# Patient Record
Sex: Female | Born: 1935 | Race: Black or African American | Hispanic: No | Marital: Married | Smoking: Current every day smoker
Health system: Southern US, Community
[De-identification: ages and names within clinical notes are randomized; demographics above are authoritative.]

## PROBLEM LIST (undated history)

## (undated) DIAGNOSIS — I2510 Atherosclerotic heart disease of native coronary artery without angina pectoris: Secondary | ICD-10-CM

## (undated) DIAGNOSIS — I5030 Unspecified diastolic (congestive) heart failure: Secondary | ICD-10-CM

## (undated) DIAGNOSIS — J449 Chronic obstructive pulmonary disease, unspecified: Secondary | ICD-10-CM

## (undated) DIAGNOSIS — K559 Vascular disorder of intestine, unspecified: Secondary | ICD-10-CM

## (undated) DIAGNOSIS — I739 Peripheral vascular disease, unspecified: Secondary | ICD-10-CM

## (undated) DIAGNOSIS — R0989 Other specified symptoms and signs involving the circulatory and respiratory systems: Secondary | ICD-10-CM

## (undated) DIAGNOSIS — D497 Neoplasm of unspecified behavior of endocrine glands and other parts of nervous system: Secondary | ICD-10-CM

## (undated) DIAGNOSIS — K219 Gastro-esophageal reflux disease without esophagitis: Secondary | ICD-10-CM

## (undated) DIAGNOSIS — K5730 Diverticulosis of large intestine without perforation or abscess without bleeding: Secondary | ICD-10-CM

## (undated) DIAGNOSIS — Z8679 Personal history of other diseases of the circulatory system: Secondary | ICD-10-CM

## (undated) DIAGNOSIS — I779 Disorder of arteries and arterioles, unspecified: Secondary | ICD-10-CM

## (undated) DIAGNOSIS — F419 Anxiety disorder, unspecified: Secondary | ICD-10-CM

## (undated) DIAGNOSIS — D353 Benign neoplasm of craniopharyngeal duct: Secondary | ICD-10-CM

## (undated) DIAGNOSIS — Z950 Presence of cardiac pacemaker: Principal | ICD-10-CM

## (undated) DIAGNOSIS — I495 Sick sinus syndrome: Secondary | ICD-10-CM

## (undated) DIAGNOSIS — E785 Hyperlipidemia, unspecified: Secondary | ICD-10-CM

## (undated) DIAGNOSIS — I480 Paroxysmal atrial fibrillation: Secondary | ICD-10-CM

## (undated) DIAGNOSIS — M1990 Unspecified osteoarthritis, unspecified site: Secondary | ICD-10-CM

## (undated) DIAGNOSIS — L89609 Pressure ulcer of unspecified heel, unspecified stage: Secondary | ICD-10-CM

## (undated) DIAGNOSIS — N189 Chronic kidney disease, unspecified: Secondary | ICD-10-CM

## (undated) DIAGNOSIS — I771 Stricture of artery: Secondary | ICD-10-CM

## (undated) DIAGNOSIS — J189 Pneumonia, unspecified organism: Secondary | ICD-10-CM

## (undated) DIAGNOSIS — D352 Benign neoplasm of pituitary gland: Secondary | ICD-10-CM

## (undated) HISTORY — DX: Peripheral vascular disease, unspecified: I73.9

## (undated) HISTORY — DX: Sick sinus syndrome: I49.5

## (undated) HISTORY — DX: Stricture of artery: I77.1

## (undated) HISTORY — DX: Diverticulosis of large intestine without perforation or abscess without bleeding: K57.30

## (undated) HISTORY — DX: Paroxysmal atrial fibrillation: I48.0

## (undated) HISTORY — PX: TOTAL KNEE ARTHROPLASTY: SHX125

## (undated) HISTORY — DX: Chronic kidney disease, unspecified: N18.9

## (undated) HISTORY — PX: CARDIAC CATHETERIZATION: SHX172

## (undated) HISTORY — DX: Benign neoplasm of pituitary gland: D35.2

## (undated) HISTORY — PX: BILATERAL HIP ARTHROSCOPY: SUR89

## (undated) HISTORY — DX: Benign neoplasm of craniopharyngeal duct: D35.3

## (undated) HISTORY — DX: Pressure ulcer of unspecified heel, unspecified stage: L89.609

## (undated) HISTORY — DX: Unspecified diastolic (congestive) heart failure: I50.30

## (undated) HISTORY — DX: Personal history of other diseases of the circulatory system: Z86.79

## (undated) HISTORY — DX: Hyperlipidemia, unspecified: E78.5

## (undated) HISTORY — PX: CHOLECYSTECTOMY: SHX55

## (undated) HISTORY — PX: OTHER SURGICAL HISTORY: SHX169

## (undated) HISTORY — DX: Chronic obstructive pulmonary disease, unspecified: J44.9

## (undated) HISTORY — DX: Unspecified osteoarthritis, unspecified site: M19.90

## (undated) HISTORY — DX: Atherosclerotic heart disease of native coronary artery without angina pectoris: I25.10

## (undated) HISTORY — DX: Gastro-esophageal reflux disease without esophagitis: K21.9

## (undated) HISTORY — DX: Presence of cardiac pacemaker: Z95.0

## (undated) HISTORY — DX: Other specified symptoms and signs involving the circulatory and respiratory systems: R09.89

---

## 1996-05-17 HISTORY — PX: OTHER SURGICAL HISTORY: SHX169

## 1996-05-18 ENCOUNTER — Encounter (INDEPENDENT_AMBULATORY_CARE_PROVIDER_SITE_OTHER): Payer: Self-pay | Admitting: Internal Medicine

## 1996-05-18 LAB — CONVERTED CEMR LAB

## 1997-06-17 HISTORY — PX: OTHER SURGICAL HISTORY: SHX169

## 1998-04-22 ENCOUNTER — Inpatient Hospital Stay (HOSPITAL_COMMUNITY): Admission: RE | Admit: 1998-04-22 | Discharge: 1998-04-25 | Payer: Self-pay | Admitting: Orthopaedic Surgery

## 1998-04-22 ENCOUNTER — Encounter: Payer: Self-pay | Admitting: Orthopaedic Surgery

## 1998-04-25 ENCOUNTER — Inpatient Hospital Stay
Admission: RE | Admit: 1998-04-25 | Discharge: 1998-05-02 | Payer: Self-pay | Admitting: Physical Medicine and Rehabilitation

## 1999-01-30 ENCOUNTER — Encounter: Payer: Self-pay | Admitting: Internal Medicine

## 1999-01-30 ENCOUNTER — Inpatient Hospital Stay (HOSPITAL_COMMUNITY): Admission: EM | Admit: 1999-01-30 | Discharge: 1999-02-02 | Payer: Self-pay | Admitting: Emergency Medicine

## 2000-02-25 ENCOUNTER — Encounter: Payer: Self-pay | Admitting: Orthopaedic Surgery

## 2000-03-01 ENCOUNTER — Inpatient Hospital Stay (HOSPITAL_COMMUNITY): Admission: RE | Admit: 2000-03-01 | Discharge: 2000-03-04 | Payer: Self-pay | Admitting: Orthopaedic Surgery

## 2000-03-04 ENCOUNTER — Inpatient Hospital Stay (HOSPITAL_COMMUNITY)
Admission: AD | Admit: 2000-03-04 | Discharge: 2000-03-11 | Payer: Self-pay | Admitting: Physical Medicine & Rehabilitation

## 2000-03-10 ENCOUNTER — Encounter: Payer: Self-pay | Admitting: Physical Medicine & Rehabilitation

## 2001-10-29 ENCOUNTER — Emergency Department (HOSPITAL_COMMUNITY): Admission: EM | Admit: 2001-10-29 | Discharge: 2001-10-29 | Payer: Self-pay | Admitting: Emergency Medicine

## 2002-02-26 ENCOUNTER — Encounter: Payer: Self-pay | Admitting: Cardiology

## 2002-02-26 ENCOUNTER — Inpatient Hospital Stay (HOSPITAL_COMMUNITY): Admission: EM | Admit: 2002-02-26 | Discharge: 2002-03-02 | Payer: Self-pay | Admitting: Emergency Medicine

## 2002-02-27 ENCOUNTER — Encounter: Payer: Self-pay | Admitting: Cardiology

## 2002-04-16 ENCOUNTER — Encounter (INDEPENDENT_AMBULATORY_CARE_PROVIDER_SITE_OTHER): Payer: Self-pay | Admitting: Internal Medicine

## 2002-07-16 HISTORY — PX: OTHER SURGICAL HISTORY: SHX169

## 2002-08-16 ENCOUNTER — Encounter (INDEPENDENT_AMBULATORY_CARE_PROVIDER_SITE_OTHER): Payer: Self-pay | Admitting: Internal Medicine

## 2002-08-24 ENCOUNTER — Encounter: Payer: Self-pay | Admitting: Emergency Medicine

## 2002-08-24 ENCOUNTER — Inpatient Hospital Stay (HOSPITAL_COMMUNITY): Admission: EM | Admit: 2002-08-24 | Discharge: 2002-08-26 | Payer: Self-pay | Admitting: Emergency Medicine

## 2002-08-26 ENCOUNTER — Encounter: Payer: Self-pay | Admitting: Family Medicine

## 2002-10-16 ENCOUNTER — Encounter (INDEPENDENT_AMBULATORY_CARE_PROVIDER_SITE_OTHER): Payer: Self-pay | Admitting: Internal Medicine

## 2002-12-16 ENCOUNTER — Encounter (INDEPENDENT_AMBULATORY_CARE_PROVIDER_SITE_OTHER): Payer: Self-pay | Admitting: Internal Medicine

## 2002-12-16 LAB — CONVERTED CEMR LAB: Hgb A1c MFr Bld: 6.6 %

## 2003-01-11 ENCOUNTER — Encounter (INDEPENDENT_AMBULATORY_CARE_PROVIDER_SITE_OTHER): Payer: Self-pay | Admitting: Internal Medicine

## 2003-01-11 LAB — CONVERTED CEMR LAB: Microalbumin U total vol: 403.5 mg/L

## 2003-04-17 ENCOUNTER — Encounter (INDEPENDENT_AMBULATORY_CARE_PROVIDER_SITE_OTHER): Payer: Self-pay | Admitting: Internal Medicine

## 2003-11-15 ENCOUNTER — Encounter (INDEPENDENT_AMBULATORY_CARE_PROVIDER_SITE_OTHER): Payer: Self-pay | Admitting: Internal Medicine

## 2003-11-15 LAB — CONVERTED CEMR LAB
Hgb A1c MFr Bld: 6.2 %
Microalbumin U total vol: 35.3 mg/L

## 2004-02-18 ENCOUNTER — Ambulatory Visit: Payer: Self-pay | Admitting: Orthopedic Surgery

## 2004-04-02 ENCOUNTER — Ambulatory Visit: Payer: Self-pay | Admitting: Family Medicine

## 2004-04-14 ENCOUNTER — Ambulatory Visit: Payer: Self-pay | Admitting: *Deleted

## 2004-04-20 ENCOUNTER — Ambulatory Visit: Payer: Self-pay | Admitting: *Deleted

## 2004-04-27 ENCOUNTER — Observation Stay: Payer: Self-pay | Admitting: *Deleted

## 2004-05-01 ENCOUNTER — Ambulatory Visit: Payer: Self-pay | Admitting: *Deleted

## 2004-05-17 ENCOUNTER — Encounter (INDEPENDENT_AMBULATORY_CARE_PROVIDER_SITE_OTHER): Payer: Self-pay | Admitting: Internal Medicine

## 2004-05-17 LAB — CONVERTED CEMR LAB: Microalbumin U total vol: 32.4 mg/L

## 2004-05-28 ENCOUNTER — Ambulatory Visit: Payer: Self-pay | Admitting: Family Medicine

## 2004-06-09 ENCOUNTER — Ambulatory Visit: Payer: Self-pay | Admitting: Family Medicine

## 2004-07-07 ENCOUNTER — Ambulatory Visit: Payer: Self-pay | Admitting: *Deleted

## 2004-09-04 ENCOUNTER — Ambulatory Visit: Payer: Self-pay | Admitting: Cardiology

## 2004-09-04 ENCOUNTER — Ambulatory Visit: Payer: Self-pay | Admitting: Family Medicine

## 2004-11-14 ENCOUNTER — Encounter (INDEPENDENT_AMBULATORY_CARE_PROVIDER_SITE_OTHER): Payer: Self-pay | Admitting: Internal Medicine

## 2004-11-14 LAB — CONVERTED CEMR LAB: Hgb A1c MFr Bld: 5.8 %

## 2004-11-25 ENCOUNTER — Ambulatory Visit: Payer: Self-pay | Admitting: Family Medicine

## 2004-12-04 ENCOUNTER — Ambulatory Visit: Payer: Self-pay

## 2004-12-08 ENCOUNTER — Ambulatory Visit: Payer: Self-pay | Admitting: Cardiology

## 2005-02-20 ENCOUNTER — Emergency Department (HOSPITAL_COMMUNITY): Admission: EM | Admit: 2005-02-20 | Discharge: 2005-02-21 | Payer: Self-pay | Admitting: Emergency Medicine

## 2005-02-25 ENCOUNTER — Ambulatory Visit: Payer: Self-pay | Admitting: Family Medicine

## 2005-05-17 ENCOUNTER — Encounter (INDEPENDENT_AMBULATORY_CARE_PROVIDER_SITE_OTHER): Payer: Self-pay | Admitting: Internal Medicine

## 2005-05-17 LAB — CONVERTED CEMR LAB: Hgb A1c MFr Bld: 6 %

## 2005-06-01 ENCOUNTER — Ambulatory Visit: Payer: Self-pay | Admitting: Family Medicine

## 2005-08-12 ENCOUNTER — Ambulatory Visit: Payer: Self-pay

## 2005-08-12 ENCOUNTER — Ambulatory Visit: Payer: Self-pay | Admitting: Cardiology

## 2005-10-09 ENCOUNTER — Emergency Department (HOSPITAL_COMMUNITY): Admission: EM | Admit: 2005-10-09 | Discharge: 2005-10-09 | Payer: Self-pay | Admitting: Emergency Medicine

## 2005-10-12 ENCOUNTER — Ambulatory Visit: Payer: Self-pay | Admitting: Family Medicine

## 2006-02-16 ENCOUNTER — Encounter (INDEPENDENT_AMBULATORY_CARE_PROVIDER_SITE_OTHER): Payer: Self-pay | Admitting: Internal Medicine

## 2006-02-16 ENCOUNTER — Ambulatory Visit: Payer: Self-pay | Admitting: Family Medicine

## 2006-02-24 ENCOUNTER — Ambulatory Visit: Payer: Self-pay | Admitting: Family Medicine

## 2006-05-22 ENCOUNTER — Inpatient Hospital Stay (HOSPITAL_COMMUNITY): Admission: EM | Admit: 2006-05-22 | Discharge: 2006-05-30 | Payer: Self-pay | Admitting: Cardiology

## 2006-05-22 ENCOUNTER — Encounter: Payer: Self-pay | Admitting: Emergency Medicine

## 2006-05-22 ENCOUNTER — Ambulatory Visit: Payer: Self-pay | Admitting: *Deleted

## 2006-05-23 ENCOUNTER — Encounter (INDEPENDENT_AMBULATORY_CARE_PROVIDER_SITE_OTHER): Payer: Self-pay | Admitting: Cardiology

## 2006-06-14 ENCOUNTER — Ambulatory Visit: Payer: Self-pay | Admitting: Cardiology

## 2006-08-25 HISTORY — PX: OTHER SURGICAL HISTORY: SHX169

## 2006-10-16 ENCOUNTER — Encounter (INDEPENDENT_AMBULATORY_CARE_PROVIDER_SITE_OTHER): Payer: Self-pay | Admitting: Internal Medicine

## 2006-10-27 ENCOUNTER — Ambulatory Visit: Payer: Self-pay | Admitting: Internal Medicine

## 2006-10-27 DIAGNOSIS — D352 Benign neoplasm of pituitary gland: Secondary | ICD-10-CM

## 2006-10-27 DIAGNOSIS — M25569 Pain in unspecified knee: Secondary | ICD-10-CM

## 2006-10-27 DIAGNOSIS — D353 Benign neoplasm of craniopharyngeal duct: Secondary | ICD-10-CM

## 2006-10-27 DIAGNOSIS — I10 Essential (primary) hypertension: Secondary | ICD-10-CM

## 2006-10-27 HISTORY — DX: Benign neoplasm of pituitary gland: D35.2

## 2006-10-28 LAB — CONVERTED CEMR LAB
Creatinine, Ser: 0.9 mg/dL (ref 0.4–1.2)
GFR calc Af Amer: 77 mL/min
Hgb A1c MFr Bld: 6.4 % — ABNORMAL HIGH (ref 4.6–6.0)
Potassium: 4.6 meq/L (ref 3.5–5.1)
Sodium: 141 meq/L (ref 135–145)

## 2006-12-14 ENCOUNTER — Ambulatory Visit: Payer: Self-pay | Admitting: Cardiology

## 2006-12-27 ENCOUNTER — Ambulatory Visit: Payer: Self-pay

## 2007-01-13 ENCOUNTER — Ambulatory Visit: Payer: Self-pay | Admitting: Cardiology

## 2007-01-24 ENCOUNTER — Ambulatory Visit: Payer: Self-pay | Admitting: Cardiovascular Disease

## 2007-01-24 LAB — CONVERTED CEMR LAB
Creatinine, Ser: 1.1 mg/dL (ref 0.4–1.2)
Potassium: 4.9 meq/L (ref 3.5–5.1)
Sodium: 142 meq/L (ref 135–145)

## 2007-01-30 ENCOUNTER — Ambulatory Visit: Payer: Self-pay | Admitting: Internal Medicine

## 2007-02-13 ENCOUNTER — Ambulatory Visit: Payer: Self-pay | Admitting: Cardiovascular Disease

## 2007-03-16 ENCOUNTER — Ambulatory Visit: Payer: Self-pay | Admitting: Family Medicine

## 2007-04-26 ENCOUNTER — Encounter (INDEPENDENT_AMBULATORY_CARE_PROVIDER_SITE_OTHER): Payer: Self-pay | Admitting: Internal Medicine

## 2007-04-26 DIAGNOSIS — M129 Arthropathy, unspecified: Secondary | ICD-10-CM | POA: Insufficient documentation

## 2007-04-26 DIAGNOSIS — M1610 Unilateral primary osteoarthritis, unspecified hip: Secondary | ICD-10-CM | POA: Insufficient documentation

## 2007-04-26 DIAGNOSIS — M169 Osteoarthritis of hip, unspecified: Secondary | ICD-10-CM

## 2007-04-26 DIAGNOSIS — R0989 Other specified symptoms and signs involving the circulatory and respiratory systems: Secondary | ICD-10-CM

## 2007-04-26 DIAGNOSIS — F17200 Nicotine dependence, unspecified, uncomplicated: Secondary | ICD-10-CM

## 2007-04-26 DIAGNOSIS — R809 Proteinuria, unspecified: Secondary | ICD-10-CM | POA: Insufficient documentation

## 2007-04-26 HISTORY — DX: Other specified symptoms and signs involving the circulatory and respiratory systems: R09.89

## 2007-05-03 ENCOUNTER — Ambulatory Visit: Payer: Self-pay | Admitting: Family Medicine

## 2007-05-03 DIAGNOSIS — R42 Dizziness and giddiness: Secondary | ICD-10-CM

## 2007-05-05 LAB — CONVERTED CEMR LAB
Basophils Relative: 0.2 % (ref 0.0–1.0)
CO2: 34 meq/L — ABNORMAL HIGH (ref 19–32)
Eosinophils Relative: 2.3 % (ref 0.0–5.0)
GFR calc Af Amer: 61 mL/min
Glucose, Bld: 78 mg/dL (ref 70–99)
Hemoglobin: 13.6 g/dL (ref 12.0–15.0)
Lymphocytes Relative: 21.1 % (ref 12.0–46.0)
Microalb Creat Ratio: 505.6 mg/g — ABNORMAL HIGH (ref 0.0–30.0)
Monocytes Absolute: 0.6 10*3/uL (ref 0.2–0.7)
Neutro Abs: 6.3 10*3/uL (ref 1.4–7.7)
Potassium: 4.7 meq/L (ref 3.5–5.1)
WBC: 9 10*3/uL (ref 4.5–10.5)

## 2007-05-18 DIAGNOSIS — Z8679 Personal history of other diseases of the circulatory system: Secondary | ICD-10-CM

## 2007-05-18 HISTORY — DX: Personal history of other diseases of the circulatory system: Z86.79

## 2007-05-18 HISTORY — PX: INSERT / REPLACE / REMOVE PACEMAKER: SUR710

## 2007-05-19 ENCOUNTER — Ambulatory Visit: Payer: Self-pay | Admitting: Cardiology

## 2007-07-04 ENCOUNTER — Ambulatory Visit: Payer: Self-pay

## 2007-07-14 ENCOUNTER — Ambulatory Visit: Payer: Self-pay | Admitting: Cardiology

## 2007-09-07 ENCOUNTER — Encounter: Payer: Self-pay | Admitting: Family Medicine

## 2007-10-07 ENCOUNTER — Ambulatory Visit: Payer: Self-pay | Admitting: *Deleted

## 2007-10-07 ENCOUNTER — Inpatient Hospital Stay (HOSPITAL_COMMUNITY): Admission: EM | Admit: 2007-10-07 | Discharge: 2007-10-10 | Payer: Self-pay | Admitting: Emergency Medicine

## 2007-10-12 ENCOUNTER — Ambulatory Visit: Payer: Self-pay | Admitting: Internal Medicine

## 2007-10-12 ENCOUNTER — Inpatient Hospital Stay (HOSPITAL_COMMUNITY): Admission: AD | Admit: 2007-10-12 | Discharge: 2007-10-13 | Payer: Self-pay | Admitting: Internal Medicine

## 2007-10-17 ENCOUNTER — Ambulatory Visit: Payer: Self-pay | Admitting: Cardiology

## 2007-10-20 ENCOUNTER — Ambulatory Visit: Payer: Self-pay | Admitting: Cardiovascular Disease

## 2007-10-27 ENCOUNTER — Ambulatory Visit: Payer: Self-pay

## 2007-10-27 ENCOUNTER — Ambulatory Visit: Payer: Self-pay | Admitting: Cardiology

## 2007-11-03 ENCOUNTER — Ambulatory Visit: Payer: Self-pay | Admitting: Cardiology

## 2007-11-08 ENCOUNTER — Ambulatory Visit: Payer: Self-pay | Admitting: Internal Medicine

## 2007-11-16 ENCOUNTER — Ambulatory Visit: Payer: Self-pay | Admitting: Cardiology

## 2007-12-01 ENCOUNTER — Ambulatory Visit: Payer: Self-pay | Admitting: Cardiology

## 2007-12-13 ENCOUNTER — Ambulatory Visit: Payer: Self-pay | Admitting: Cardiology

## 2007-12-18 ENCOUNTER — Ambulatory Visit: Payer: Self-pay | Admitting: Internal Medicine

## 2007-12-18 ENCOUNTER — Ambulatory Visit: Payer: Self-pay | Admitting: Cardiology

## 2007-12-18 ENCOUNTER — Inpatient Hospital Stay (HOSPITAL_COMMUNITY): Admission: EM | Admit: 2007-12-18 | Discharge: 2007-12-25 | Payer: Self-pay | Admitting: Emergency Medicine

## 2007-12-18 ENCOUNTER — Encounter (INDEPENDENT_AMBULATORY_CARE_PROVIDER_SITE_OTHER): Payer: Self-pay | Admitting: Internal Medicine

## 2007-12-18 DIAGNOSIS — R599 Enlarged lymph nodes, unspecified: Secondary | ICD-10-CM | POA: Insufficient documentation

## 2007-12-18 DIAGNOSIS — J9600 Acute respiratory failure, unspecified whether with hypoxia or hypercapnia: Secondary | ICD-10-CM

## 2007-12-18 DIAGNOSIS — E278 Other specified disorders of adrenal gland: Secondary | ICD-10-CM | POA: Insufficient documentation

## 2007-12-18 DIAGNOSIS — J189 Pneumonia, unspecified organism: Secondary | ICD-10-CM

## 2007-12-19 ENCOUNTER — Encounter (INDEPENDENT_AMBULATORY_CARE_PROVIDER_SITE_OTHER): Payer: Self-pay | Admitting: Internal Medicine

## 2007-12-21 ENCOUNTER — Encounter (INDEPENDENT_AMBULATORY_CARE_PROVIDER_SITE_OTHER): Payer: Self-pay | Admitting: Internal Medicine

## 2007-12-25 ENCOUNTER — Encounter (INDEPENDENT_AMBULATORY_CARE_PROVIDER_SITE_OTHER): Payer: Self-pay | Admitting: Internal Medicine

## 2008-01-02 ENCOUNTER — Ambulatory Visit: Payer: Self-pay | Admitting: Cardiology

## 2008-01-04 ENCOUNTER — Ambulatory Visit: Payer: Self-pay | Admitting: Family Medicine

## 2008-01-04 DIAGNOSIS — I5032 Chronic diastolic (congestive) heart failure: Secondary | ICD-10-CM

## 2008-01-18 ENCOUNTER — Encounter (INDEPENDENT_AMBULATORY_CARE_PROVIDER_SITE_OTHER): Payer: Self-pay | Admitting: Internal Medicine

## 2008-01-19 ENCOUNTER — Inpatient Hospital Stay (HOSPITAL_COMMUNITY): Admission: EM | Admit: 2008-01-19 | Discharge: 2008-01-31 | Payer: Self-pay | Admitting: Emergency Medicine

## 2008-01-19 ENCOUNTER — Ambulatory Visit: Payer: Self-pay | Admitting: Cardiology

## 2008-01-19 ENCOUNTER — Ambulatory Visit: Payer: Self-pay | Admitting: Internal Medicine

## 2008-01-19 DIAGNOSIS — M25519 Pain in unspecified shoulder: Secondary | ICD-10-CM

## 2008-01-19 LAB — CONVERTED CEMR LAB
Basophils Absolute: 0.1 10*3/uL (ref 0.0–0.1)
Basophils Relative: 0.7 % (ref 0.0–3.0)
Hemoglobin: 11.3 g/dL — ABNORMAL LOW (ref 12.0–15.0)
Lymphocytes Relative: 7.6 % — ABNORMAL LOW (ref 12.0–46.0)
MCHC: 34.3 g/dL (ref 30.0–36.0)
MCV: 96.5 fL (ref 78.0–100.0)
Neutro Abs: 12.6 10*3/uL — ABNORMAL HIGH (ref 1.4–7.7)
RBC: 3.41 M/uL — ABNORMAL LOW (ref 3.87–5.11)

## 2008-01-23 ENCOUNTER — Encounter (INDEPENDENT_AMBULATORY_CARE_PROVIDER_SITE_OTHER): Payer: Self-pay | Admitting: Internal Medicine

## 2008-01-23 ENCOUNTER — Telehealth (INDEPENDENT_AMBULATORY_CARE_PROVIDER_SITE_OTHER): Payer: Self-pay | Admitting: Internal Medicine

## 2008-01-24 ENCOUNTER — Encounter (INDEPENDENT_AMBULATORY_CARE_PROVIDER_SITE_OTHER): Payer: Self-pay | Admitting: Internal Medicine

## 2008-01-30 ENCOUNTER — Encounter (INDEPENDENT_AMBULATORY_CARE_PROVIDER_SITE_OTHER): Payer: Self-pay | Admitting: Internal Medicine

## 2008-02-02 ENCOUNTER — Telehealth (INDEPENDENT_AMBULATORY_CARE_PROVIDER_SITE_OTHER): Payer: Self-pay | Admitting: Internal Medicine

## 2008-02-16 ENCOUNTER — Encounter: Payer: Self-pay | Admitting: Family Medicine

## 2008-02-20 ENCOUNTER — Ambulatory Visit: Payer: Self-pay | Admitting: Family Medicine

## 2008-02-22 ENCOUNTER — Telehealth (INDEPENDENT_AMBULATORY_CARE_PROVIDER_SITE_OTHER): Payer: Self-pay | Admitting: Internal Medicine

## 2008-03-04 ENCOUNTER — Ambulatory Visit: Payer: Self-pay | Admitting: Internal Medicine

## 2008-03-12 ENCOUNTER — Ambulatory Visit: Payer: Self-pay | Admitting: Cardiology

## 2008-03-12 LAB — CONVERTED CEMR LAB
ALT: 10 units/L (ref 0–35)
Albumin: 3.7 g/dL (ref 3.5–5.2)
Alkaline Phosphatase: 72 units/L (ref 39–117)
Total Protein: 6.2 g/dL (ref 6.0–8.3)

## 2008-05-22 ENCOUNTER — Ambulatory Visit: Payer: Self-pay | Admitting: Family Medicine

## 2008-05-24 LAB — CONVERTED CEMR LAB
BUN: 15 mg/dL (ref 6–23)
Calcium: 9.3 mg/dL (ref 8.4–10.5)
Creatinine, Ser: 1.1 mg/dL (ref 0.4–1.2)
GFR calc Af Amer: 61 mL/min
Glucose, Bld: 76 mg/dL (ref 70–99)
Microalb, Ur: 42.8 mg/dL — ABNORMAL HIGH (ref 0.0–1.9)
Sodium: 141 meq/L (ref 135–145)

## 2008-06-10 ENCOUNTER — Encounter (INDEPENDENT_AMBULATORY_CARE_PROVIDER_SITE_OTHER): Payer: Self-pay | Admitting: Internal Medicine

## 2008-06-11 ENCOUNTER — Encounter (INDEPENDENT_AMBULATORY_CARE_PROVIDER_SITE_OTHER): Payer: Self-pay | Admitting: Internal Medicine

## 2008-06-11 ENCOUNTER — Ambulatory Visit: Payer: Self-pay | Admitting: Cardiology

## 2008-06-11 LAB — CONVERTED CEMR LAB
ALT: 9 units/L (ref 0–35)
AST: 18 units/L (ref 0–37)
Alkaline Phosphatase: 72 units/L (ref 39–117)
Bilirubin, Direct: 0.1 mg/dL (ref 0.0–0.3)
Indirect Bilirubin: 0.1 mg/dL (ref 0.0–0.9)

## 2008-06-12 ENCOUNTER — Encounter (INDEPENDENT_AMBULATORY_CARE_PROVIDER_SITE_OTHER): Payer: Self-pay | Admitting: Internal Medicine

## 2008-06-20 ENCOUNTER — Encounter (INDEPENDENT_AMBULATORY_CARE_PROVIDER_SITE_OTHER): Payer: Self-pay | Admitting: Internal Medicine

## 2008-07-18 ENCOUNTER — Telehealth (INDEPENDENT_AMBULATORY_CARE_PROVIDER_SITE_OTHER): Payer: Self-pay | Admitting: Internal Medicine

## 2008-08-23 ENCOUNTER — Encounter (INDEPENDENT_AMBULATORY_CARE_PROVIDER_SITE_OTHER): Payer: Self-pay | Admitting: Radiology

## 2008-09-30 ENCOUNTER — Ambulatory Visit: Payer: Self-pay | Admitting: Internal Medicine

## 2008-11-27 ENCOUNTER — Encounter (INDEPENDENT_AMBULATORY_CARE_PROVIDER_SITE_OTHER): Payer: Self-pay | Admitting: Internal Medicine

## 2008-11-29 ENCOUNTER — Encounter: Payer: Self-pay | Admitting: Cardiology

## 2008-11-29 ENCOUNTER — Ambulatory Visit: Payer: Self-pay | Admitting: Cardiology

## 2008-11-29 DIAGNOSIS — I70219 Atherosclerosis of native arteries of extremities with intermittent claudication, unspecified extremity: Secondary | ICD-10-CM | POA: Insufficient documentation

## 2008-12-17 ENCOUNTER — Ambulatory Visit: Payer: Self-pay | Admitting: Family Medicine

## 2008-12-17 DIAGNOSIS — R5381 Other malaise: Secondary | ICD-10-CM | POA: Insufficient documentation

## 2008-12-17 DIAGNOSIS — R5383 Other fatigue: Secondary | ICD-10-CM

## 2008-12-17 LAB — CONVERTED CEMR LAB
Bacteria, UA: 0
Bilirubin Urine: NEGATIVE
Blood in Urine, dipstick: NEGATIVE
Casts: 0 /lpf
Glucose, Urine, Semiquant: NEGATIVE
Ketones, urine, test strip: NEGATIVE
Protein, U semiquant: NEGATIVE
RBC / HPF: 0
Urobilinogen, UA: 0.2
WBC, UA: 0 cells/hpf
pH: 6

## 2008-12-19 LAB — CONVERTED CEMR LAB
BUN: 16 mg/dL (ref 6–23)
Basophils Relative: 0.1 % (ref 0.0–3.0)
CO2: 32 meq/L (ref 19–32)
Chloride: 104 meq/L (ref 96–112)
Creatinine, Ser: 1.1 mg/dL (ref 0.4–1.2)
Eosinophils Absolute: 0.3 10*3/uL (ref 0.0–0.7)
Eosinophils Relative: 3.2 % (ref 0.0–5.0)
HCT: 38.9 % (ref 36.0–46.0)
Lymphs Abs: 1.6 10*3/uL (ref 0.7–4.0)
MCHC: 33.6 g/dL (ref 30.0–36.0)
MCV: 95.4 fL (ref 78.0–100.0)
Microalb Creat Ratio: 1.9 mg/g (ref 0.0–30.0)
Monocytes Absolute: 0.7 10*3/uL (ref 0.1–1.0)
Neutrophils Relative %: 70.3 % (ref 43.0–77.0)
Potassium: 4.6 meq/L (ref 3.5–5.1)
RBC: 4.08 M/uL (ref 3.87–5.11)
WBC: 8.6 10*3/uL (ref 4.5–10.5)

## 2008-12-24 ENCOUNTER — Ambulatory Visit: Payer: Self-pay | Admitting: Family Medicine

## 2008-12-24 DIAGNOSIS — E7800 Pure hypercholesterolemia, unspecified: Secondary | ICD-10-CM

## 2008-12-25 LAB — CONVERTED CEMR LAB
ALT: 15 units/L (ref 0–35)
AST: 28 units/L (ref 0–37)
Cholesterol: 131 mg/dL (ref 0–200)
LDL Cholesterol: 52 mg/dL (ref 0–99)
Total CHOL/HDL Ratio: 2
VLDL: 14.2 mg/dL (ref 0.0–40.0)

## 2009-03-03 ENCOUNTER — Ambulatory Visit: Payer: Self-pay | Admitting: Family Medicine

## 2009-03-31 ENCOUNTER — Encounter: Payer: Self-pay | Admitting: Internal Medicine

## 2009-03-31 ENCOUNTER — Ambulatory Visit: Payer: Self-pay | Admitting: Internal Medicine

## 2009-06-19 ENCOUNTER — Ambulatory Visit: Payer: Self-pay | Admitting: Cardiovascular Disease

## 2009-06-19 DIAGNOSIS — I2510 Atherosclerotic heart disease of native coronary artery without angina pectoris: Secondary | ICD-10-CM | POA: Insufficient documentation

## 2009-06-19 DIAGNOSIS — I739 Peripheral vascular disease, unspecified: Secondary | ICD-10-CM | POA: Insufficient documentation

## 2009-06-19 DIAGNOSIS — E785 Hyperlipidemia, unspecified: Secondary | ICD-10-CM | POA: Insufficient documentation

## 2009-06-20 ENCOUNTER — Encounter: Payer: Self-pay | Admitting: Cardiovascular Disease

## 2009-07-06 ENCOUNTER — Emergency Department (HOSPITAL_COMMUNITY): Admission: EM | Admit: 2009-07-06 | Discharge: 2009-07-06 | Payer: Self-pay | Admitting: Emergency Medicine

## 2009-07-09 ENCOUNTER — Ambulatory Visit: Payer: Self-pay | Admitting: Family Medicine

## 2009-07-09 DIAGNOSIS — J449 Chronic obstructive pulmonary disease, unspecified: Secondary | ICD-10-CM

## 2009-07-09 DIAGNOSIS — R0789 Other chest pain: Secondary | ICD-10-CM | POA: Insufficient documentation

## 2009-07-09 DIAGNOSIS — J4489 Other specified chronic obstructive pulmonary disease: Secondary | ICD-10-CM | POA: Insufficient documentation

## 2009-07-09 DIAGNOSIS — F4322 Adjustment disorder with anxiety: Secondary | ICD-10-CM

## 2009-08-05 ENCOUNTER — Telehealth: Payer: Self-pay | Admitting: Family Medicine

## 2009-08-06 ENCOUNTER — Telehealth (INDEPENDENT_AMBULATORY_CARE_PROVIDER_SITE_OTHER): Payer: Self-pay | Admitting: *Deleted

## 2009-08-13 ENCOUNTER — Ambulatory Visit: Payer: Self-pay | Admitting: Family Medicine

## 2009-09-15 ENCOUNTER — Ambulatory Visit: Payer: Self-pay | Admitting: Internal Medicine

## 2009-09-15 DIAGNOSIS — I4891 Unspecified atrial fibrillation: Secondary | ICD-10-CM | POA: Insufficient documentation

## 2009-09-15 DIAGNOSIS — Z950 Presence of cardiac pacemaker: Secondary | ICD-10-CM

## 2009-09-15 HISTORY — DX: Presence of cardiac pacemaker: Z95.0

## 2009-09-19 ENCOUNTER — Telehealth: Payer: Self-pay | Admitting: Family Medicine

## 2009-10-23 ENCOUNTER — Ambulatory Visit: Payer: Self-pay | Admitting: Family Medicine

## 2009-10-30 DIAGNOSIS — M1990 Unspecified osteoarthritis, unspecified site: Secondary | ICD-10-CM | POA: Insufficient documentation

## 2009-12-03 ENCOUNTER — Ambulatory Visit: Payer: Self-pay | Admitting: Family Medicine

## 2009-12-03 DIAGNOSIS — R1011 Right upper quadrant pain: Secondary | ICD-10-CM

## 2009-12-04 ENCOUNTER — Emergency Department (HOSPITAL_COMMUNITY): Admission: EM | Admit: 2009-12-04 | Discharge: 2009-12-05 | Payer: Self-pay | Admitting: Emergency Medicine

## 2009-12-04 LAB — CONVERTED CEMR LAB
Albumin: 3.9 g/dL (ref 3.5–5.2)
Basophils Relative: 0.1 % (ref 0.0–3.0)
Eosinophils Relative: 0.3 % (ref 0.0–5.0)
HCT: 43.4 % (ref 36.0–46.0)
Hemoglobin: 14.6 g/dL (ref 12.0–15.0)
Lipase: 12 units/L (ref 11.0–59.0)
Lymphs Abs: 1.8 10*3/uL (ref 0.7–4.0)
MCV: 95.4 fL (ref 78.0–100.0)
Monocytes Absolute: 0.8 10*3/uL (ref 0.1–1.0)
Monocytes Relative: 7.7 % (ref 3.0–12.0)
Neutro Abs: 8.2 10*3/uL — ABNORMAL HIGH (ref 1.4–7.7)
Platelets: 402 10*3/uL — ABNORMAL HIGH (ref 150.0–400.0)
RBC: 4.55 M/uL (ref 3.87–5.11)
Total Bilirubin: 0.5 mg/dL (ref 0.3–1.2)
WBC: 11 10*3/uL — ABNORMAL HIGH (ref 4.5–10.5)

## 2009-12-05 ENCOUNTER — Encounter: Payer: Self-pay | Admitting: Family Medicine

## 2010-01-07 ENCOUNTER — Ambulatory Visit: Payer: Self-pay | Admitting: Family Medicine

## 2010-01-09 ENCOUNTER — Encounter: Payer: Self-pay | Admitting: Family Medicine

## 2010-02-05 ENCOUNTER — Telehealth: Payer: Self-pay | Admitting: Family Medicine

## 2010-02-13 ENCOUNTER — Telehealth: Payer: Self-pay | Admitting: Family Medicine

## 2010-02-27 ENCOUNTER — Inpatient Hospital Stay (HOSPITAL_COMMUNITY): Admission: EM | Admit: 2010-02-27 | Discharge: 2010-03-02 | Payer: Self-pay | Admitting: Emergency Medicine

## 2010-03-03 ENCOUNTER — Telehealth: Payer: Self-pay | Admitting: Family Medicine

## 2010-03-09 ENCOUNTER — Ambulatory Visit: Payer: Self-pay | Admitting: Family Medicine

## 2010-03-09 DIAGNOSIS — K529 Noninfective gastroenteritis and colitis, unspecified: Secondary | ICD-10-CM

## 2010-04-02 ENCOUNTER — Ambulatory Visit: Payer: Self-pay | Admitting: Internal Medicine

## 2010-05-21 ENCOUNTER — Ambulatory Visit
Admission: RE | Admit: 2010-05-21 | Discharge: 2010-05-21 | Payer: Self-pay | Source: Home / Self Care | Attending: Family Medicine | Admitting: Family Medicine

## 2010-06-08 ENCOUNTER — Encounter: Payer: Self-pay | Admitting: Family Medicine

## 2010-06-16 NOTE — Assessment & Plan Note (Signed)
Summary: Medicare AWV   Vital Signs:  Patient profile:   77 year old female Height:      63 inches Weight:      170.13 pounds BMI:     30.25 Temp:     98.4 degrees F oral Pulse rate:   64 / minute Pulse rhythm:   regular BP sitting:   120 / 80  (left arm) Cuff size:   regular  Vitals Entered By: Linde Gillis CMA Duncan Dull) (January 07, 2010 9:12 AM) CC: Medicare AWV   History of Present Illness: Here for Medicare AWV:  1.   Risk factors based on Past M, S, F history: CAD- followed by cardiology.  Arthritis- s/p bilateral knee replacement, uses walker, is a fall risk.  Increased knee pain.  2.   Physical Activities: gets around with her walker. 3.   Depression/mood: Denies signs and symptoms of depression. 4.   Hearing: see nursing assessment. 5.   ADL's: see geriatrics assessment. 6.   Fall Risk: Is at a fall risk but home is safe and has a lot of family support 7.   Home Safety:  safe 8.   Height, weight, &visual acuity: see nursing assessment. 9.   Counseling: counselled on need for Zostavax and discussed other screening.  We agreed she does not need a pap smear, mammogram or colonscopy. 10.   Labs ordered based on risk factors: UTD 11.           Referral Coordination- none necesssary 12.           Care Plan- Tramadol for increased knee pain. 13.            Cognitive Assessment- see geriatrics assessment.   Current Medications (verified): 1)  Zocor 40 Mg  Tabs (Simvastatin) .... Take 1 Tablet By Mouth Once A Day 2)  Benazepril Hcl 20 Mg Tabs (Benazepril Hcl) .... Take 2 Tablets By Mouth Daily 3)  Aleve 220 Mg Tabs (Naproxen Sodium) .... As Needed 4)  Aspirin 81 Mg  Tabs (Aspirin) .... Take 1 Tablet By Mouth Once A Day 5)  Mirtazapine 7.5 Mg Tabs (Mirtazapine) .[name].. 1 Tab By Mouth Qhs 6)  Metformin Hcl 500 Mg Tabs (Metformin Hcl) .... Take 1 Tab Twice Daily 7)  Carvedilol 6.25 Mg Tabs (Carvedilol) .... Take One Tablet By Mouth Twice A Day 8)  Tramadol Hcl 50 Mg  Tabs  (Tramadol Hcl) .[name].. 1-2 Tab By Mouth Two Times A Day As Needed Pain  Allergies: 1)  ! Naprosyn  Past History:  Past Medical History: Last updated: 09/30/2008 Hyperlipidemia Myocardial infarction, hx of Diabetes mellitus, type II CAD PVD Symptomatic bradycardia Paroxysmal atrial fibrillation Hx of A flutter Hx of chronic diastolic heart failure Hx of nonobstructive carotid disease Arthritis  Past Surgical History: Last updated: 12/03/2009  Right knee replacement 1998 Left knee replacement 12/99 Right hip replacement 1997 Left hip replacement 11/01 Pituitary tumor removed- [facility] - benign 12/02 Recurrent pituitary tumor - [facility] 01/08 Myocardial perfusion study, EF 59% 04/04 Adenosine Myoview study - abn EF 53% 12/04/04 Carotid doppler 02/99 ECHO with [facility] ( old records) 01/98 Stress cardiolite - EF 53% 03/04 CATH some obstructive dz 05/22/06 CATH - MI, stent 3/08 GAMMA knife radiosurg  ([facility])--08/25/06 PVD with stent in the right iliac  Family History: Last updated: 2007/05/05 Father: Died of unknown causes Mother: Deceased, HTN Siblings: 1 brother, HTN, CAD,                2 sisters alive  with HTN    CV + brother HBP + DM + Arthritis + patient No cancer Depression ?  Social History: Last updated: 05/22/2008 Marital Status: Widow, husband died at age 58, DM, HTN Children: 3 Occupation: Agricultural engineer in the past, active in church --son lives with her and does most ot the housework and cooking--1/10  Risk Factors: Smoking Status: current (05/22/2008) Packs/Day: 5-8cigs qd (05/22/2008)  Review of Systems      See HPI General:  Denies malaise. Eyes:  Denies blurring. CV:  Denies chest pain or discomfort. Resp:  Denies shortness of breath. GI:  Denies abdominal pain, bloody stools, and change in bowel habits. MS:  Complains of joint pain and joint swelling; denies joint redness and loss of strength. Derm:  Denies rash. Neuro:  Denies falling  down and headaches. Psych:  Denies anxiety, depression, sense of great danger, suicidal thoughts/plans, thoughts of violence, unusual visions or sounds, and thoughts /plans of harming others. Heme:  Denies abnormal bruising and bleeding.  Physical Exam  General:  well-appearing African American woman, obviously uncomfortable.  Lungs:  normal respiratory effort, no intercostal retractions, no accessory muscle use, and normal breath sounds.   Heart:  normal rate and regular rhythm.  very soft systolic  murmur Msk:  bilateral old scars from knee replacements bilaterally, some pain with motion, no redness or warmth Extremities:  trace pitting edema at ankles bilat   Neurologic:  alert & oriented X3 and cranial nerves II-XII intact.   Walks with walker, shuffles Psych:  normally interactive, flat affect, and subdued.     Impression & Recommendations:  Problem # 1:  Preventive Health Care (ICD-V70.0)  Reviewed preventive care protocols, scheduled due services, and updated immunizations Discussed nutrition, exercise, diet, and healthy lifestyle.  Zostavax today.  Orders: Medicare -1st Annual Wellness Visit [phone])  Problem # 2:  DEGENERATIVE JOINT DISEASE, ADVANCED (ICD-715.90) Assessment: Deteriorated Will try Tramadol for pain.  Pt to call in one month with update on how she is feeling. Her updated medication list for this problem includes:    Aleve 220 Mg Tabs (Naproxen sodium) .[name]... As needed    Aspirin 81 Mg Tabs (Aspirin) .[name]... Take 1 tablet by mouth once a day    Tramadol Hcl 50 Mg Tabs (Tramadol hcl) .[name]... 1-2 tab by mouth two times a day as needed pain  Complete Medication List: 1)  Zocor 40 Mg Tabs (Simvastatin) .... Take 1 tablet by mouth once a day 2)  Benazepril Hcl 20 Mg Tabs (Benazepril hcl) .... Take 2 tablets by mouth daily 3)  Aleve 220 Mg Tabs (Naproxen sodium) .... As needed 4)  Aspirin 81 Mg Tabs (Aspirin) .... Take 1 tablet by mouth once a day 5)   Mirtazapine 7.5 Mg Tabs (Mirtazapine) .[name].. 1 tab by mouth qhs 6)  Metformin Hcl 500 Mg Tabs (Metformin hcl) .... Take 1 tab twice daily 7)  Carvedilol 6.25 Mg Tabs (Carvedilol) .... Take one tablet by mouth twice a day 8)  Tramadol Hcl 50 Mg Tabs (Tramadol hcl) .[name].. 1-2 tab by mouth two times a day as needed pain Prescriptions: TRAMADOL HCL 50 MG  TABS (TRAMADOL HCL) 1-2 tab by mouth two times a day as needed pain  #60 x 0   Entered and Authorized by:   Ruthe Mannan MD   Signed by:   Ruthe Mannan MD on 01/07/2010   Method used:   Electronically to        [facility]* (retail)  [address]       [place], [place]  [address]       Ph: [phone]       Fax: [phone]   RxID:   [id number]   Current Allergies (reviewed today): ! NAPROSYN   Prevention & Chronic Care Immunizations   Influenza vaccine: Fluvax 3+  (03/03/2009)   Influenza vaccine due: 03/03/2010    Tetanus booster: 05/18/1992: Td   Td booster deferral: Refused  (01/07/2010)   Tetanus booster due: 05/18/2002    Pneumococcal vaccine: Pneumovax per pt  (05/19/1995)   Pneumococcal vaccine deferral: Not indicated  (01/07/2010)   Pneumococcal vaccine due: None    H. zoster vaccine: Not documented  Colorectal Screening   Hemoccult: Negative  (02/07/2001)   Hemoccult action/deferral: Not indicated  (01/07/2010)   Hemoccult due: 02/07/2002    Colonoscopy: Not documented   Colonoscopy action/deferral: Not indicated  (01/07/2010)  Other Screening   Pap smear: Done per pt.  (05/18/1996)   Pap smear action/deferral: Not indicated-other  (01/07/2010)   Pap smear due: 05/18/1997    Mammogram: Normal  (02/28/2001)   Mammogram action/deferral: Not indicated  (01/07/2010)   Mammogram due: 02/28/2002    DXA bone density scan: Not documented   DXA bone density action/deferral: Not indicated  (01/07/2010)   Smoking status: current  (05/22/2008)  Diabetes Mellitus   HgbA1C: 6.5  (12/17/2008)   Hemoglobin  A1C due: 03/19/2009    Eye exam: Not documented    Foot exam: yes  (05/22/2008)   High risk foot: Not documented   Foot care education: Not documented   Foot exam due: 05/22/2009    Urine microalbumin/creatinine ratio: 1.9  (12/17/2008)   Urine microalbumin/cr due: 12/17/2009  Lipids   Total Cholesterol: 131  (12/24/2008)   LDL: 52  (12/24/2008)   LDL Direct: Not documented   HDL: 64.90  (12/24/2008)   Triglycerides: 71.0  (12/24/2008)    SGOT (AST): 25  (12/03/2009)   SGPT (ALT): 12  (12/03/2009)   Alkaline phosphatase: 86  (12/03/2009)   Total bilirubin: 0.5  (12/03/2009)  Hypertension   Last Blood Pressure: 120 / 80  (01/07/2010)   Serum creatinine: 1.1  (12/17/2008)   Serum potassium 4.6  (12/17/2008)  Self-Management Support :    Diabetes self-management support: Not documented    Hypertension self-management support: Not documented    Lipid self-management support: Not documented    Nursing Instructions: Give Herpes zoster vaccine today         Geriatric Assessment:  Activities of Daily Living:    Bathing-independent    Dressing-independent    Eating-independent    Toileting-independent    Transferring-independent    Continence-assisted  Instrumental Activities of Daily Living:    Transportation-assisted    Meal/Food Preparation-assisted    Shopping Errands-assisted    Housekeeping/Chores-assisted    Money Management/Finances-assisted    Medication Management-independent    Ability to Use Telephone-independent    Laundry-assisted  Mental Status Exam: (value/max value)    Orientation to Time: 5/5    Orientation to Place: 4/5    Registration: 3/3    Attention/Calculation: 5/5    Recall: 3/3    Language-name 2 objects: 2/2    Language-repeat: 1/1    Language-follow 3-step command: 3/3    Language-read and follow direction: 1/1    Write a sentence: 1/1    Copy design: 1/1 MSE Total score: 29/30   Appended Document: Medicare  AWV     Vision Screening:Left eye with correction: 20 /  40 Right eye with correction: 20 / 40 Both eyes with correction: 20 / 40  Color vision testing: normal     Vision Comments: Patient wears glasses and says she cannot see that well far away.  Vision Entered By: Linde Gillis CMA Duncan Dull) (January 07, 2010 10:26 AM)  Hearing Screen  20db HL: Left  500 hz: 20db 1000 hz: 20db 2000 hz: 20db 4000 hz: 20db Right  1000 hz: 20db 2000 hz: 20db 4000 hz: 20db   Hearing Testing Entered By: Linde Gillis CMA Duncan Dull) (January 07, 2010 10:26 AM)   Allergies: 1)  ! Naprosyn   Complete Medication List: 1)  Zocor 40 Mg Tabs (Simvastatin) .... Take 1 tablet by mouth once a day 2)  Benazepril Hcl 20 Mg Tabs (Benazepril hcl) .... Take 2 tablets by mouth daily 3)  Aleve 220 Mg Tabs (Naproxen sodium) .... As needed 4)  Aspirin 81 Mg Tabs (Aspirin) .... Take 1 tablet by mouth once a day 5)  Mirtazapine 7.5 Mg Tabs (Mirtazapine) .[name].. 1 tab by mouth qhs 6)  Metformin Hcl 500 Mg Tabs (Metformin hcl) .... Take 1 tab twice daily 7)  Carvedilol 6.25 Mg Tabs (Carvedilol) .... Take one tablet by mouth twice a day 8)  Tramadol Hcl 50 Mg Tabs (Tramadol hcl) .[name].. 1-2 tab by mouth two times a day as needed pain  Other Orders: Zoster (Shingles) Vaccine Live [phone]) Admin 1st Vaccine ([id number])    Immunizations Administered:  Zostavax # 1:    Vaccine Type: Zostavax    Site: left deltoid    Mfr: Merck    Dose: 0.5ml    Route: [place]    Given by: Linde Gillis CMA (AAMA)    Exp. Date: 12/10/2010    Lot #: [id number]    VIS given: 02/26/05 given January 07, 2010.

## 2010-06-16 NOTE — Assessment & Plan Note (Signed)
Summary: ROA FOR 1 MONTH FOLLOW-UP/JRR   Vital Signs:  Patient profile:   77 year old female Height:      65 inches Weight:      178.38 pounds Temp:     97.8 degrees F oral Pulse rate:   80 / minute Pulse rhythm:   regular BP sitting:   104 / 60  (left arm) Cuff size:   large  Vitals Entered By: Delilah Shan CMA Duncan Dull) (August 13, 2009 2:51 PM) CC: 1 month follow up   History of Present Illness: 77 yo here for follow up adjustment disorder. 77 yo female new to me here for ER follow up.  Saw her one month ago and at that she felt like she was getting depressed and anxious since she quit smoking, it used to help her relax and fall asleep at night.  Appetite has also been decreasing past several months.  We therefore started Remeron 7.5 mg at bedtime last month.  She is here with her sister today.  They both report that her mood and appetite are much better.  She has actually gained 8 pounds since her last office visit.  Anxiety has dimiinshed and she is sleeping much better at night.  Not noticing any side effects.  Current Medications (verified): 1)  Zocor 40 Mg  Tabs (Simvastatin) .... Take 1 Tablet By Mouth Once A Day 2)  Cardizem Cd 120 Mg Xr24h-Cap (Diltiazem Hcl Coated Beads) .... Take 1 Tablet By Mouth Once A Day 3)  Benazepril Hcl 20 Mg Tabs (Benazepril Hcl) .... Take 2 Tablets By Mouth Daily 4)  Aleve 220 Mg Tabs (Naproxen Sodium) .... As Needed 5)  Aspirin 81 Mg  Tabs (Aspirin) .... Take 1 Tablet By Mouth Once A Day 6)  Mirtazapine 7.5 Mg Tabs (Mirtazapine) .[name].. 1 Tab By Mouth Qhs 7)  Metformin Hcl 500 Mg Tabs (Metformin Hcl) .... Take 1 Tab Twice Daily  Allergies: 1)  ! Naprosyn  Review of Systems      See HPI Psych:  Denies anxiety, depression, suicidal thoughts/plans, thoughts of violence, unusual visions or sounds, and thoughts /plans of harming others.  Physical Exam  General:  well-appearing African American woman in no apparent distress, gained 8  pounds Extremities:  trace pitting edema at ankles bilat Psych:  normally interactive, flat affect, and subdued.     Impression & Recommendations:  Problem # 1:  ADJUSTMENT DISORDER WITH ANXIETY (ICD-309.24) Assessment Improved Time spent with patient 25 minutes, more than 50% of this time was spent discussing her anxiety.  She and her sister feel that remeron at current dose is working.  She is anxious about gaining too much weight so we discussed that now that her appetite has improved, we need to make smart food choices.  Given a list of foods that she could snack on.  Also suggested stopping ensure shakes as they are high in calories, and she is now getting her calories with a regular diet.  Complete Medication List: 1)  Zocor 40 Mg Tabs (Simvastatin) .... Take 1 tablet by mouth once a day 2)  Cardizem Cd 120 Mg Xr24h-cap (Diltiazem hcl coated beads) .... Take 1 tablet by mouth once a day 3)  Benazepril Hcl 20 Mg Tabs (Benazepril hcl) .... Take 2 tablets by mouth daily 4)  Aleve 220 Mg Tabs (Naproxen sodium) .... As needed 5)  Aspirin 81 Mg Tabs (Aspirin) .... Take 1 tablet by mouth once a day 6)  Mirtazapine 7.5 Mg Tabs (Mirtazapine) .Marland KitchenMarland KitchenMarland Kitchen  1 tab by mouth qhs 7)  Metformin Hcl 500 Mg Tabs (Metformin hcl) .... Take 1 tab twice daily  Current Allergies (reviewed today): ! NAPROSYN

## 2010-06-16 NOTE — Miscellaneous (Signed)
  Clinical Lists Changes  Orders: Added new Test order of Carotid Duplex (Carotid Duplex) - Signed Added new Test order of Renal Artery Duplex (Renal Artery Duplex) - Signed

## 2010-06-16 NOTE — Assessment & Plan Note (Signed)
Summary: [name]   Vital Signs:  Patient profile:   77 year old female Height:      65 inches Weight:      170 pounds Temp:     97.9 degrees F oral Pulse rate:   84 / minute Pulse rhythm:   regular BP sitting:   142 / 72  (left arm) Cuff size:   large  Vitals Entered By: Delilah Shan CMA Duncan Dull) (July 09, 2009 9:53 AM) CC: Hospital follow up [facility])  Transfer ([facility])   History of Present Illness: 77 yo female new to me here for ER follow up.  Went to [facility] on 07/06/09 for chest pain and shortness of breath.   Was found to be hypotensive with a contusion on her abdomen that she was unable to provide h/o trauma or fall.  ER records reviewed:  CBC, BMET, CXR, hemocult, UA were all normal.  Had a CT of abdomen and pelvis which was also normal. Cardiac enzymes and EKG were normal.  Chest pain resolved after receiving IVFs.  Sent home with portable O2 and O2 home concentrator since her sats dropped in ED below 88% when walking.  She has only been using the oxygen at night because it dries out her nose and she does not feel short of breath  Has not smoking since discharge, which she feels is also helping her breathing.  No SOB or chest pain since she left the hospital.  Was told to stop taking Metformin due to CT scan dye and to stop Labetolol due to hypotension. Denies any dizziness, HA or blurred vision.  She does feel like she is getting depressed and anxious since she quit smoking, it used to help her relax and fall asleep at night.  Appetite has also been decreasing past several months.  Current Medications (verified): 1)  Zocor 40 Mg  Tabs (Simvastatin) .... Take 1 Tablet By Mouth Once A Day 2)  Cardizem Cd 120 Mg Xr24h-Cap (Diltiazem Hcl Coated Beads) .... Take 1 Tablet By Mouth Once A Day 3)  Benazepril Hcl 20 Mg Tabs (Benazepril Hcl) .... Take 2 Tablets By Mouth Daily 4)  Aleve 220 Mg Tabs (Naproxen Sodium) .... As Needed 5)  Aspirin 81 Mg  Tabs  (Aspirin) .... Take 1 Tablet By Mouth Once A Day 6)  Mirtazapine 7.5 Mg Tabs (Mirtazapine) .[name].. 1 Tab By Mouth Qhs  Allergies: 1)  ! Naprosyn  Past History:  Past Medical History: Last updated: 09/30/2008 Hyperlipidemia Myocardial infarction, hx of Diabetes mellitus, type II CAD PVD Symptomatic bradycardia Paroxysmal atrial fibrillation Hx of A flutter Hx of chronic diastolic heart failure Hx of nonobstructive carotid disease Arthritis  Past Surgical History: Last updated: 09/30/2008 Cholecystectomy ? 1998 Right knee replacement 1998 Left knee replacement 12/99 Right hip replacement 1997 Left hip replacement 11/01 Pituitary tumor removed- [facility] - benign 12/02 Recurrent pituitary tumor - [facility] 01/08 Myocardial perfusion study, EF 59% 04/04 Adenosine Myoview study - abn EF 53% 12/04/04 Carotid doppler 02/99 ECHO with [facility] ( old records) 01/98 Stress cardiolite - EF 53% 03/04 CATH some obstructive dz 05/22/06 CATH - MI, stent 3/08 GAMMA knife radiosurg  ([facility])--08/25/06 PVD with stent in the right iliac  Review of Systems      See HPI General:  Denies chills, fatigue, and fever. Eyes:  Denies blurring. ENT:  Denies difficulty swallowing. CV:  Denies chest pain or discomfort, difficulty breathing while lying down, near fainting, palpitations, shortness of breath with  exertion, swelling of feet, and swelling of hands. Resp:  Complains of cough; denies shortness of breath, sputum productive, and wheezing. GI:  Denies abdominal pain, diarrhea, nausea, and vomiting. MS:  Denies muscle weakness. Neuro:  Denies headaches, visual disturbances, and weakness. Psych:  Complains of anxiety and depression; denies easily angered, easily tearful, irritability, mental problems, panic attacks, sense of great danger, suicidal thoughts/plans, thoughts of violence, unusual visions or sounds, and thoughts /plans of harming others. Endo:  Denies cold intolerance and weight change. Heme:   Denies bleeding, enlarge lymph nodes, and fevers.  Physical Exam  General:  well-appearing African American woman in no apparent distress, oxygen in room but not on her. vs reviewed- O2 sat without oxygen seated 98% and after walking around building 98% Eyes:  PERRLA/EOM intact; conjunctiva and lids normal. Mouth:  MMM Lungs:  normal respiratory effort, no intercostal retractions, no accessory muscle use, and normal breath sounds.   Heart:  normal rate and regular rhythm.  very soft systolic  murmur Abdomen:  soft, non-tender, normal bowel sounds, no distention, and no guarding.  echymosis around bra line on upper abdomen. Extremities:  trace pitting edema at ankles bilat Psych:  normally interactive, flat affect, and subdued.     Impression & Recommendations:  Problem # 1:  CHEST PAIN, ATYPICAL (ICD-786.59) 45 minutes spent with patient, more then 50% spent discussing medical history,  reviewing hospital records and current symptoms. Likely a combination of hypotension, angina, and COPD.  Will continue to hold the Labetolol and follow up with me in one month, grand daughter to help check BP at home.  Pt followed regularly by cardiology for nonobstructive coronary artery disease,  history of respiratory failure in August of 09 secondary to rapid fibrillation and diastolic relaxation abnormality, history of sick sinus syndrome with ablation in May of 2009 with discontinuation of Coumadin in September of 09 secondary to bleeding.  Now on low dose ASA daily.  Problem # 2:  COPD (ICD-496) Assessment: Unchanged Agree that she does not need O2 continuously as she is sating 98% off oxygen at rest and after exertion.  Continue using nightly since it seems to help.  Advised calling [facility] for an air humidifier to help with dryness. Congratulated her on smoking cessation!  Problem # 3:  TOBACCO ABUSE (ICD-305.1) Assessment: Improved See above, congratulated her on quitting.  Problem #  4:  DIABETES MELLITUS, TYPE II, RENAL (ICD-250.00) Assessment: Unchanged Advised her to restart Metformin . Held for CT scan only. The following medications were removed from the medication list:    Metformin Hcl 500 Mg Tabs (Metformin hcl) .[name]... 1 tab two times a day    Bl Aspirin 325 Mg Tabs (Aspirin) .[name]... Take 1 tablet by mouth once a day Her updated medication list for this problem includes:    Benazepril Hcl 20 Mg Tabs (Benazepril hcl) .[name]... Take 2 tablets by mouth daily    Aspirin 81 Mg Tabs (Aspirin) .[name]... Take 1 tablet by mouth once a day    Metformin Hcl 500 Mg Tabs (Metformin hcl) .[name]... Take 1 tab twice daily  Problem # 5:  ADJUSTMENT DISORDER WITH ANXIETY (ICD-309.24) Assessment: New Will start Remeron, to help with anxiety, depression, and appetite.  Weight is actually increased from last office visit.  Continue to monitor.  She will follow up with me in 1 month. Orders: Prescription Created Electronically [id number])  Complete Medication List: 1)  Zocor 40 Mg Tabs (Simvastatin) .... Take 1 tablet by mouth once  a day 2)  Cardizem Cd 120 Mg Xr24h-cap (Diltiazem hcl coated beads) .... Take 1 tablet by mouth once a day 3)  Benazepril Hcl 20 Mg Tabs (Benazepril hcl) .... Take 2 tablets by mouth daily 4)  Aleve 220 Mg Tabs (Naproxen sodium) .... As needed 5)  Aspirin 81 Mg Tabs (Aspirin) .... Take 1 tablet by mouth once a day 6)  Mirtazapine 7.5 Mg Tabs (Mirtazapine) .[name].. 1 tab by mouth qhs 7)  Metformin Hcl 500 Mg Tabs (Metformin hcl) .... Take 1 tab twice daily  Patient Instructions: 1)  Restart Metformin.   2)  Keep holding the metoprolol,. 3)  Come see me in 1 month. 4)  Ok to use the oxygen at night only. Prescriptions: METFORMIN HCL 500 MG TABS (METFORMIN HCL) Take 1 tab twice daily  #180 x 3   Entered and Authorized by:   Ruthe Mannan MD   Signed by:   Ruthe Mannan MD on 07/09/2009   Method used:   Historical   RxID:   [id number] MIRTAZAPINE 7.5 MG TABS (MIRTAZAPINE)  1 tab by mouth qhs  #30 x 0   Entered and Authorized by:   Ruthe Mannan MD   Signed by:   Ruthe Mannan MD on 07/09/2009   Method used:   Electronically to        [facility]* (retail)       [address]       [place], [place]  [address]       Ph: [phone]       Fax: [phone]   RxID:   [id number]   Current Allergies (reviewed today): ! NAPROSYN

## 2010-06-16 NOTE — Progress Notes (Signed)
Summary: tramadol   Phone Note Refill Request Message from:  Fax from Pharmacy on February 13, 2010 2:07 PM  Refills Requested: Medication #1:  TRAMADOL HCL 50 MG  TABS 1-2 tab by mouth two times a day as needed pain   Last Refilled: 01/07/2010 Refill request from [facility] pharmacy. [phone].  Initial call taken by: Melody Comas,  February 13, 2010 2:08 PM    Prescriptions: TRAMADOL HCL 50 MG  TABS (TRAMADOL HCL) 1-2 tab by mouth two times a day as needed pain  #60 x 0   Entered and Authorized by:   Ruthe Mannan MD   Signed by:   Ruthe Mannan MD on 02/13/2010   Method used:   Electronically to        [facility]* (retail)       [address]       [place], [place]  [address]       Ph: [phone]       Fax: [phone]   RxID:   [id number]

## 2010-06-16 NOTE — Medication Information (Signed)
Summary: [facility]  [facility]   Imported By: Lanelle Bal 01/20/2010 08:39:11  _____________________________________________________________________  External Attachment:    Type:   Image     Comment:   External Document

## 2010-06-16 NOTE — Letter (Signed)
Summary: Paperwork for [employer]  [employer] for [employer]   Imported By: Lanelle Bal 10/29/2009 13:26:01  _____________________________________________________________________  External Attachment:    Type:   Image     Comment:   External Document

## 2010-06-16 NOTE — Progress Notes (Signed)
Summary: rx for walk in shower  Phone Note Call from Patient Call back at [phone]   Caller: [name]- granddaughter Call For: Ruthe Mannan MD Summary of Call: [name] is asking if she could get a rx for a walk in shower. Patient is having a hard time getting in and out of the tub. Please call [name] when script is ready, she wants to come and pick it up.  Initial call taken by: Melody Comas,  February 05, 2010 10:48 AM  Follow-up for Phone Call        Does she mean a shower chair? Ruthe Mannan MD  February 05, 2010 10:53 AM  Spoke with [name], she said its not a shower chair but a walk in tub.  The kind of tub that opens up so patient can just walk into it instead of having to step up into a regular bath tube.  Linde Gillis CMA Duncan Dull)  February 05, 2010 11:29 AM    Additional Follow-up for Phone Call Additional follow up Details #1::        ok thanks for verifying.  rx n my box. Ruthe Mannan MD  February 05, 2010 12:00 PM  Left message on machine at home for [name]'s granddaughter that the Rx was ready for pick up will be left at front desk.  Additional Follow-up by: Linde Gillis CMA Duncan Dull),  February 05, 2010 2:16 PM

## 2010-06-16 NOTE — Progress Notes (Signed)
Summary: refill request for mirtazepine  Phone Note Refill Request Message from:  Fax from Pharmacy  Refills Requested: Medication #1:  MIRTAZAPINE 7.5 MG TABS 1 tab by mouth qhs Faxed request from [facility].  Request is for 15 mg, pt takes one half at bedtime.  Initial call taken by: Lowella Petties CMA,  August 05, 2009 3:16 PM    Prescriptions: MIRTAZAPINE 7.5 MG TABS (MIRTAZAPINE) 1 tab by mouth qhs  #30 x 6   Entered and Authorized by:   Ruthe Mannan MD   Signed by:   Ruthe Mannan MD on 08/06/2009   Method used:   Electronically to        [facility]* (retail)       [address]       [place], [place]  [address]       Ph: [phone]       Fax: [phone]   RxID:   [id number]

## 2010-06-16 NOTE — Procedures (Signed)
Summary: 11:15 APPT/AMD      Allergies Added:   Visit Type:  Follow-up Primary Provider:  Roxy Manns  CC:  c/o dizziness and shortness of breath..  History of Present Illness: [name] returns today for followup.  She is a pleasant 77 yo woman with a h/o atrial fibrillation, HTN, symptomatic bradycardia and is s/p PPM insertion.  She denies c/p or sob.  She has had problems with arthritis and peripheral edema.  Lately, she has felt dizzy and has noted weight loss and poor appetite. No syncope.  Current Medications (verified): 1)  Zocor 40 Mg  Tabs (Simvastatin) .... Take 1 Tablet By Mouth Once A Day 2)  Benazepril Hcl 20 Mg Tabs (Benazepril Hcl) .... Take 2 Tablets By Mouth Daily 3)  Aleve 220 Mg Tabs (Naproxen Sodium) .... As Needed 4)  Aspirin 81 Mg  Tabs (Aspirin) .... Take 1 Tablet By Mouth Once A Day 5)  Metformin Hcl 500 Mg Tabs (Metformin Hcl) .... Take 1 Tab Twice Daily 6)  Carvedilol 6.25 Mg Tabs (Carvedilol) .... Take One Tablet By Mouth Twice A Day 7)  Tramadol Hcl 50 Mg  Tabs (Tramadol Hcl) .[name].. 1-2 Tab By Mouth Two Times A Day As Needed Pain 8)  Accu-Chek Aviva  Strp (Glucose Blood) .... Check Blood Sugar 2-3 Times Per Day As Directed 9)  Ensure  Liqd (Nutritional Supplements) .... Drink Two To Three Times Daily 10)  Proventil Hfa 108 (90 Base) Mcg/act Aers (Albuterol Sulfate) .... Inhale Two Puffs Every Four Hous As Needed  Allergies (verified): 1)  ! Naprosyn  Past History:  Past Medical History: Last updated: 09/30/2008 Hyperlipidemia Myocardial infarction, hx of Diabetes mellitus, type II CAD PVD Symptomatic bradycardia Paroxysmal atrial fibrillation Hx of A flutter Hx of chronic diastolic heart failure Hx of nonobstructive carotid disease Arthritis  Past Surgical History: Last updated: 12/03/2009  Right knee replacement 1998 Left knee replacement 12/99 Right hip replacement 1997 Left hip replacement 11/01 Pituitary tumor removed- [facility] - benign  12/02 Recurrent pituitary tumor - [facility] 01/08 Myocardial perfusion study, EF 59% 04/04 Adenosine Myoview study - abn EF 53% 12/04/04 Carotid doppler 02/99 ECHO with [facility] ( old records) 01/98 Stress cardiolite - EF 53% 03/04 CATH some obstructive dz 05/22/06 CATH - MI, stent 3/08 GAMMA knife radiosurg  ([facility])--08/25/06 PVD with stent in the right iliac  Risk Factors: Smoking Status: current (05/22/2008) Packs/Day: 5-8cigs qd (05/22/2008)  Review of Systems  The patient denies chest pain, syncope, dyspnea on exertion, and peripheral edema.    Vital Signs:  Patient profile:   77 year old female Height:      63 inches Weight:      158 pounds BMI:     28.09 Pulse rate:   67 / minute BP sitting:   98 / 42  (left arm) Cuff size:   regular  Vitals Entered By: Bishop Dublin, CMA (April 02, 2010 11:10 AM)  Physical Exam  General:  elderly appearing African American woman nad Head:  normocephalic and atraumatic Eyes:  PERRLA/EOM intact; conjunctiva and lids normal. Mouth:  MMM Neck:  Neck supple, no JVD. No masses, thyromegaly or abnormal cervical nodes. Chest Wall:  Well healed PPM incision. Lungs:  normal respiratory effort, no intercostal retractions, no accessory muscle use, and normal breath sounds.   Heart:  normal rate and regular rhythm.  very soft systolic  murmur Abdomen:  soft, non-tender, normal bowel sounds, no distention, and no guarding.  Msk:  bilateral old scars from  knee replacements bilaterally, some pain with motion, no redness or warmth Pulses:  diminished bilaterally Extremities:  trace pitting edema at ankles bilat   Neurologic:  alert & oriented X3 and cranial nerves II-XII intact.   Walks with walker, shuffles   PPM Specifications Following MD:  Lewayne Bunting, MD     PPM Vendor:  [employer]     PPM Model Number:  [id number]     PPM Serial Number:  [id number] PPM DOI:  10/12/2007     PPM Implanting MD:  Lewayne Bunting, MD  Lead 1    Location: [facility]     DOI:  10/12/2007     Model #: [id number]     Serial #: [id number]     Status: active Lead 2    Location: RV     DOI: 10/12/2007     Model #: [id number]     Serial #: [id number]     Status: active  Magnet Response Rate:  BOL 98.6 ERI  86.3  Indications:  A-Flutter,Tachy-Brady Syndrome   PPM Follow Up Remote Check?  No Battery Voltage:  2.79 V     Battery Est. Longevity:  9.75 years     Pacer Dependent:  No       PPM Device Measurements Atrium  Amplitude: 1.0 mV, Impedance: 368 ohms, Threshold: 0.5 V at 0.4 msec Right Ventricle  Amplitude: 12 mV, Impedance: 330 ohms, Threshold: 0.875 V at 0.4 msec  Episodes MS Episodes:  1793     Percent Mode Switch:  2.6%     Coumadin:  No Atrial Pacing:  90%     Ventricular Pacing:  <1%  Parameters Mode:  DDD     Lower Rate Limit:  60     Upper Rate Limit:  115 Paced AV Delay:  250     Sensed AV Delay:  225 Next Cardiology Appt Due:  09/15/2010 Tech Comments:  The longest mode switch was 51:22 minutes, - coumadin.  Ventricular autocapture on today.  ROV 6 months [facility]. Altha Harm, LPN  April 02, 2010 11:24 AM  MD Comments:  Agree with above.  Impression & Recommendations:  Problem # 1:  ATRIAL FIBRILLATION (ICD-427.31) She has no symptoms.  Her dizziness does not correlate to her atrial fib.  I have asked that she continue her current meds.  She is not a candidate for coumadin with her h/o dizziness and propensity to fall. Her updated medication list for this problem includes:    Aspirin 81 Mg Tabs (Aspirin) .[name]... Take 1 tablet by mouth once a day    Carvedilol 6.25 Mg Tabs (Carvedilol) .[name]... Take one tablet by mouth twice a day  Problem # 2:  CARDIAC PACEMAKER IN SITU (ICD-V45.01) Her device is working normally.  Will recheck in several months.  Problem # 3:  FATIGUE (ICD-780.79) The etiology is unclear and is associated with anorexia and dizziness.  I do not know about all of her medical evaluation.  Could she have a recurrence of her pituitary  process?  Patient Instructions: 1)  Your physician recommends that you schedule a follow-up appointment in: 6 months pacer check, 1 year follow-up with Dr Ladona Ridgel. 2)  Your physician recommends that you continue on your current medications as directed. Please refer to the Current Medication list given to you today.

## 2010-06-16 NOTE — Assessment & Plan Note (Signed)
Summary: F/U [name].  D/C 03/02/10/[facility]   Vital Signs:  Patient profile:   77 year old female Height:      63 inches Weight:      161 pounds BMI:     28.62 Temp:     97.8 degrees F oral Pulse rate:   68 / minute Pulse rhythm:   regular BP sitting:   110 / 62  (left arm) Cuff size:   regular  Vitals Entered By: Linde Gillis CMA Duncan Dull) (March 09, 2010 11:50 AM) CC: ER follow up    History of Present Illness: This is an 77 year old [place] American female here for follow up colitis.  Admitted [facility] for  infectious colitis found on CT scan.  WBC elevated on admission to 15,000.    Her symptoms quickly improved, white count became normal,  Remains on by mouth cipro and Flagyl (day 7 of 11). No fevers or chills.  Has had decreased appetite, lost 10 pounds since August. No vomiting and diarrhea has resolved but feels the abx make her a little nauseated.    Current Medications (verified): 1)  Zocor 40 Mg  Tabs (Simvastatin) .... Take 1 Tablet By Mouth Once A Day 2)  Benazepril Hcl 20 Mg Tabs (Benazepril Hcl) .... Take 2 Tablets By Mouth Daily 3)  Aleve 220 Mg Tabs (Naproxen Sodium) .... As Needed 4)  Aspirin 81 Mg  Tabs (Aspirin) .... Take 1 Tablet By Mouth Once A Day 5)  Mirtazapine 7.5 Mg Tabs (Mirtazapine) .[name].. 1 Tab By Mouth Qhs 6)  Metformin Hcl 500 Mg Tabs (Metformin Hcl) .... Take 1 Tab Twice Daily 7)  Carvedilol 6.25 Mg Tabs (Carvedilol) .... Take One Tablet By Mouth Twice A Day 8)  Tramadol Hcl 50 Mg  Tabs (Tramadol Hcl) .[name].. 1-2 Tab By Mouth Two Times A Day As Needed Pain 9)  Accu-Chek Aviva  Strp (Glucose Blood) .... Check Blood Sugar 2-3 Times Per Day As Directed 10)  Ensure  Liqd (Nutritional Supplements) .... Drink Two To Three Times Daily 11)  Flagyl 500 Mg Tabs (Metronidazole) .... Take One Tablet By Mouth Three Times A Day For 11 Days 12)  Cipro 500 Mg Tabs (Ciprofloxacin Hcl) .... Take One Tablet By Mouth Twice Daily For 11 Days 13)  Proventil Hfa 108 (90  Base) Mcg/act Aers (Albuterol Sulfate) .... Inhale Two Puffs Every Four Hous As Needed  Allergies: 1)  ! Naprosyn  Past History:  Past Medical History: Last updated: 09/30/2008 Hyperlipidemia Myocardial infarction, hx of Diabetes mellitus, type II CAD PVD Symptomatic bradycardia Paroxysmal atrial fibrillation Hx of A flutter Hx of chronic diastolic heart failure Hx of nonobstructive carotid disease Arthritis  Past Surgical History: Last updated: 12/03/2009  Right knee replacement 1998 Left knee replacement 12/99 Right hip replacement 1997 Left hip replacement 11/01 Pituitary tumor removed- [facility] - benign 12/02 Recurrent pituitary tumor - [facility] 01/08 Myocardial perfusion study, EF 59% 04/04 Adenosine Myoview study - abn EF 53% 12/04/04 Carotid doppler 02/99 ECHO with [facility] ( old records) 01/98 Stress cardiolite - EF 53% 03/04 CATH some obstructive dz 05/22/06 CATH - MI, stent 3/08 GAMMA knife radiosurg  ([facility])--08/25/06 PVD with stent in the right iliac  Family History: Last updated: 2007-05-15 Father: Died of unknown causes Mother: Deceased, HTN Siblings: 1 brother, HTN, CAD,                2 sisters alive with HTN    CV + brother HBP + DM + Arthritis + patient  No cancer Depression ?  Social History: Last updated: 05/22/2008 Marital Status: Widow, husband died at age 73, DM, HTN Children: 3 Occupation: Agricultural engineer in the past, active in church --son lives with her and does most ot the housework and cooking--1/10  Risk Factors: Smoking Status: current (05/22/2008) Packs/Day: 5-8cigs qd (05/22/2008)  Review of Systems      See HPI General:  Denies fever. GI:  Complains of nausea; denies abdominal pain, bloody stools, and vomiting.  Physical Exam  General:  well-appearing African American woman Mouth:  MMM Abdomen:  soft, non-tender, normal bowel sounds, no distention, and no guarding.  Extremities:  trace pitting edema at ankles  bilat   Psych:  normally interactive, flat affect, and subdued.     Impression & Recommendations:  Problem # 1:  COLITIS ENTERIT&GASTROENTERIT INF ORIGIN (ICD-009.1) Assessment New  Symptoms resolving.   Finish abx. Advised ensure and yogurt with active cultures. MD in hospital stated that she would need a follow up colonoscopy.  Discussed with pt and her family, agreed to defer colonoscopy at this point as it will not change our management.  Orders: Prescription Created Electronically [phone])  Complete Medication List: 1)  Zocor 40 Mg Tabs (Simvastatin) .... Take 1 tablet by mouth once a day 2)  Benazepril Hcl 20 Mg Tabs (Benazepril hcl) .... Take 2 tablets by mouth daily 3)  Aleve 220 Mg Tabs (Naproxen sodium) .... As needed 4)  Aspirin 81 Mg Tabs (Aspirin) .... Take 1 tablet by mouth once a day 5)  Mirtazapine 7.5 Mg Tabs (Mirtazapine) .[name].. 1 tab by mouth qhs 6)  Metformin Hcl 500 Mg Tabs (Metformin hcl) .... Take 1 tab twice daily 7)  Carvedilol 6.25 Mg Tabs (Carvedilol) .... Take one tablet by mouth twice a day 8)  Tramadol Hcl 50 Mg Tabs (Tramadol hcl) .[name].. 1-2 tab by mouth two times a day as needed pain 9)  Accu-chek Aviva Strp (Glucose blood) .... Check blood sugar 2-3 times per day as directed 10)  Ensure Liqd (Nutritional supplements) .... Drink two to three times daily 11)  Flagyl 500 Mg Tabs (Metronidazole) .... Take one tablet by mouth three times a day for 11 days 12)  Cipro 500 Mg Tabs (Ciprofloxacin hcl) .... Take one tablet by mouth twice daily for 11 days 13)  Proventil Hfa 108 (90 Base) Mcg/act Aers (Albuterol sulfate) .... Inhale two puffs every four hous as needed Prescriptions: TRAMADOL HCL 50 MG  TABS (TRAMADOL HCL) 1-2 tab by mouth two times a day as needed pain  #60 x 3   Entered and Authorized by:   Ruthe Mannan MD   Signed by:   Ruthe Mannan MD on 03/09/2010   Method used:   Electronically to        [facility]* (retail)       [address]        [place], [place]  [address]       Ph: [phone]       Fax: [phone]   RxID:   [id number] ZOCOR 40 MG  TABS (SIMVASTATIN) Take 1 tablet by mouth once a day  #30 x 11   Entered and Authorized by:   Ruthe Mannan MD   Signed by:   Ruthe Mannan MD on 03/09/2010   Method used:   Electronically to        [facility]* (retail)       [address]       [place], [place]  [address]  Ph: [phone]       Fax: [phone]   RxID:   [id number]    Orders Added: 1)  Est. Patient Level IV [51884] 2)  Prescription Created Electronically [phone]    Current Allergies (reviewed today): ! NAPROSYN

## 2010-06-16 NOTE — Assessment & Plan Note (Signed)
Summary: EC6/AMD  Medications Added ALEVE 220 MG TABS (NAPROXEN SODIUM) as needed        Visit Type:  EC6 Primary Provider:  Roxy Manns  CC:  No complaints.  History of Present Illness: [name] is a very pleasant 77 year old woman with past medical history of nonobstructive coronary artery disease, stent placed to her left circumflex in 2003 which was patent on catheterization in 2008, history of peripheral vascular disease with bilateral carotid disease, 6279% of the right internal carotid, 40-59% left internal carotid artery, also history of COPD, history of respiratory failure in August of 09 secondary to rapid fibrillation and diastolic relaxation abnormality, history of sick sinus syndrome with ablation in May of 2009 with discontinuation of Coumadin in September of 09 secondary to bleeding. She presents for routine followup. Next  [name] states that overall she feels well. She is very bothered by her knee pain which is worse on the left than the right. She has a history of knee replacement bilaterally over 10 years ago. She states that she seen her orthopedic physician sometime ago and x-rays were done though nothing was performed. She has tried Aleve and Tylenol with no improvement of her pain. she denies any significant chest pain, shortness of breath and states that overall from a cardiovascular point of view, she is doing well.  Allergies: 1)  ! Naprosyn  Past History:  Past Medical History: Last updated: 09/30/2008 Hyperlipidemia Myocardial infarction, hx of Diabetes mellitus, type II CAD PVD Symptomatic bradycardia Paroxysmal atrial fibrillation Hx of A flutter Hx of chronic diastolic heart failure Hx of nonobstructive carotid disease Arthritis  Past Surgical History: Last updated: 09/30/2008 Cholecystectomy ? 1998 Right knee replacement 1998 Left knee replacement 12/99 Right hip replacement 1997 Left hip replacement 11/01 Pituitary tumor  removed- [facility] - benign 12/02 Recurrent pituitary tumor - [facility] 01/08 Myocardial perfusion study, EF 59% 04/04 Adenosine Myoview study - abn EF 53% 12/04/04 Carotid doppler 02/99 ECHO with [facility] ( old records) 01/98 Stress cardiolite - EF 53% 03/04 CATH some obstructive dz 05/22/06 CATH - MI, stent 3/08 GAMMA knife radiosurg  ([facility])--08/25/06 PVD with stent in the right iliac  Family History: Last updated: May 04, 2007 Father: Died of unknown causes Mother: Deceased, HTN Siblings: 1 brother, HTN, CAD,                2 sisters alive with HTN    CV + brother HBP + DM + Arthritis + patient No cancer Depression ?  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 lives with her and does most ot the housework and cooking--1/10  Risk Factors: Smoking Status: current (05/22/2008) Packs/Day: 5-8cigs qd (05/22/2008)  Review of Systems  The patient denies anorexia, fever, weight loss, weight gain, vision loss, decreased hearing, hoarseness, chest pain, syncope, dyspnea on exertion, peripheral edema, prolonged cough, headaches, hemoptysis, abdominal pain, melena, hematochezia, severe indigestion/heartburn, hematuria, incontinence, genital sores, muscle weakness, suspicious skin lesions, transient blindness, difficulty walking, depression, unusual weight change, abnormal bleeding, enlarged lymph nodes, angioedema, breast masses, and testicular masses.         severe knee pain, left greater than right  Vital Signs:  Patient profile:   77 year old female Height:      65 inches Weight:      166.75 pounds BMI:     27.85 Pulse rate:   96 / minute Pulse  rhythm:   regular BP sitting:   174 / 83  (left arm) Cuff size:   large  Vitals Entered By: Mercer Pod (June 19, 2009 4:42 PM)  Physical Exam  General:  well-appearing African American woman  in no apparent distress, her HEENT exam is benign, oropharynx is clear, neck is supple with no JVP or carotid bruits, heart sounds are regular with normal S1 and S2 with no murmurs appreciated, lungs are clear to auscultation with no wheezes or rales abdominal exam is benign, she has no significant lower extremity edema, and neurological exam is grossly nonfocal, skin is warm and dry. Pulses are equal and symmetrical in her upper and lower extremities.    PPM Specifications Following MD:  Lewayne Bunting, MD     PPM Vendor:  [employer]     PPM Model Number:  [id number]     PPM Serial Number:  [id number] PPM DOI:  10/12/2007     PPM Implanting MD:  Lewayne Bunting, MD  Lead 1    Location: [facility]     DOI: 10/12/2007     Model #: [id number]     Serial #: [id number]     Status: active Lead 2    Location: [facility]     DOI: 10/12/2007     Model #: [id number]     Serial #: [id number]     Status: active  Magnet Response Rate:  BOL 98.6 ERI  86.3  Indications:  A-Flutter,Tachy-Brady Syndrome   PPM Follow Up Pacer Dependent:  No      Episodes Coumadin:  No  Parameters Mode:  DDD     Lower Rate Limit:  60     Upper Rate Limit:  115 Paced AV Delay:  250     Sensed AV Delay:  225  Impression & Recommendations:  Problem # 1:  MYOCARDIAL INFARCTION, HX OF, CATH & STENT (ICD-412) Ms. Pavlock appears to be stable from a coronary disease perspective. She denies any significant symptoms. She had a catheter in 2008 at which time her left circumflex stent was patent. She is doing well on medical management and we've not made any changes to her medications. Her updated medication list for this problem includes:    Bl Aspirin 325 Mg Tabs (Aspirin) .[name]... Take 1 tablet by mouth once a day    Metoprolol Tartrate 25 Mg Tabs (Metoprolol tartrate) .[name]... Take 1 by mouth two times a day    Cardizem Cd 120 Mg Xr24h-cap (Diltiazem hcl coated beads) .[name]... Take 1 tablet by mouth once a day    Benazepril Hcl 20 Mg Tabs (Benazepril hcl) .[name]... Take 2 tablets  by mouth daily  Problem # 2:  HYPERLIPIDEMIA-MIXED (ICD-272.4) cholesterol was last checked in summer of 2010 and she was at goal with cholesterol 131, LDL 52, triglycerides 71 and HDL 64. We'll continue the Zocor at its current dose. Her updated medication list for this problem includes:    Zocor 40 Mg Tabs (Simvastatin) .[name]... Take 1 tablet by mouth once a day  Problem # 3:  PVD (ICD-443.9) Ms. Scheeler has bilateral carotid arterial disease, history of stent placed to her right iliac. She will need repeat carotid as well as iliac arterial ultrasound to confirm no significant progression of her disease. As we discussed with her on her next clinic visit.  Problem # 4:  TOBACCO ABUSE (ICD-305.1) previous notes indicate that the patient denies smoking though she continues to smoke one pack over several days. We have again  counseled her on smoking cessation, that it worsens her coronary as well as peripheral disease.we have offered medications and recommended over-the-counter patches though she has refused these at this time.  Problem # 5:  HYPERTENSION, BENIGN ESSENTIAL (ICD-401.1) Ms. Sandt's blood pressure is elevated today though she states that she forgot her diltiazem this morning she's run out and is due to get a refill tomorrow. This would also explain her heart rate which is moderately elevated. I've asked her to watch her blood pressure and her heart rate at home. Just states that she is more stressed that she has a friend in the hospital was very sick. Her updated medication list for this problem includes:    Bl Aspirin 325 Mg Tabs (Aspirin) .[name]... Take 1 tablet by mouth once a day    Metoprolol Tartrate 25 Mg Tabs (Metoprolol tartrate) .[name]... Take 1 by mouth two times a day    Cardizem Cd 120 Mg Xr24h-cap (Diltiazem hcl coated beads) .[name]... Take 1 tablet by mouth once a day    Benazepril Hcl 20 Mg Tabs (Benazepril hcl) .[name]... Take 2 tablets by mouth daily  Appended Document: EC6/AMD EKG  shows normal sinus rhythm, rate of 93 beats per minute, rare APC, T-wave abnormality noted in the inferior leads( 3 and aVF )

## 2010-06-16 NOTE — Progress Notes (Signed)
Summary: Discontinue O2  Phone Note From Other Clinic   Caller: [facility]  [phone]  Call For: Dr. Dayton Martes Summary of Call: [facility] says that this patient was sent home from the hospital on O2 but is now telling them that she has not used it in quite some time and she wants them to pick it up.  They need an order to discontinue. Initial call taken by: Delilah Shan CMA Duncan Dull),  Sep 19, 2009 4:42 PM  Follow-up for Phone Call        [facility] to discontinue. Ruthe Mannan MD  Sep 21, 2009 4:30 PM  Respiratory therapist advised.  Please fax order to discontinue to:   FAX:  [phone]  Delilah Shan CMA (AAMA)  Sep 22, 2009 8:44 AM   on my desk. Ruthe Mannan MD  Sep 22, 2009 8:58 AM  Faxed. Lugene Fuquay CMA Duncan Dull)  Sep 22, 2009 9:27 AM

## 2010-06-16 NOTE — Progress Notes (Signed)
   Faxed Demo Sheet over to [facility] to fax [phone] [facility]  August 06, 2009 1:30 PM

## 2010-06-16 NOTE — Letter (Signed)
Summary: [employer]   Imported By: Maryln Gottron 12/30/2009 10:59:55  _____________________________________________________________________  External Attachment:    Type:   Image     Comment:   External Document

## 2010-06-16 NOTE — Assessment & Plan Note (Signed)
Summary: F6M/AMD   Primary Provider:  Roxy Manns  CC:  device check.  History of Present Illness: [name] returns today for followup.  She is a pleasant 77 yo woman with a h/o atrial fibrillation, HTN, symptomatic bradycardia and is s/p PPM insertion.  She denies c/p or sob.  She has had problems with arthritis and peripheral edema.  No other complaints.  Current Medications (verified): 1)  Zocor 40 Mg  Tabs (Simvastatin) .... Take 1 Tablet By Mouth Once A Day 2)  Cardizem Cd 120 Mg Xr24h-Cap (Diltiazem Hcl Coated Beads) .... Take 1 Tablet By Mouth Once A Day 3)  Benazepril Hcl 20 Mg Tabs (Benazepril Hcl) .... Take 2 Tablets By Mouth Daily 4)  Aleve 220 Mg Tabs (Naproxen Sodium) .... As Needed 5)  Aspirin 81 Mg  Tabs (Aspirin) .... Take 1 Tablet By Mouth Once A Day 6)  Mirtazapine 7.5 Mg Tabs (Mirtazapine) .[name].. 1 Tab By Mouth Qhs 7)  Metformin Hcl 500 Mg Tabs (Metformin Hcl) .... Take 1 Tab Twice Daily  Allergies: 1)  ! Naprosyn  Past History:  Past Medical History: Last updated: 09/30/2008 Hyperlipidemia Myocardial infarction, hx of Diabetes mellitus, type II CAD PVD Symptomatic bradycardia Paroxysmal atrial fibrillation Hx of A flutter Hx of chronic diastolic heart failure Hx of nonobstructive carotid disease Arthritis  Past Surgical History: Last updated: 09/30/2008 Cholecystectomy ? 1998 Right knee replacement 1998 Left knee replacement 12/99 Right hip replacement 1997 Left hip replacement 11/01 Pituitary tumor removed- [facility] - benign 12/02 Recurrent pituitary tumor - [facility] 01/08 Myocardial perfusion study, EF 59% 04/04 Adenosine Myoview study - abn EF 53% 12/04/04 Carotid doppler 02/99 ECHO with [facility] ( old records) 01/98 Stress cardiolite - EF 53% 03/04 CATH some obstructive dz 05/22/06 CATH - MI, stent 3/08 GAMMA knife radiosurg  ([facility])--08/25/06 PVD with stent in the right iliac  Review of Systems       The patient complains of peripheral  edema.  The patient denies chest pain, syncope, and dyspnea on exertion.    Vital Signs:  Patient profile:   77 year old female Height:      63 inches Weight:      180 pounds Pulse rate:   70 / minute BP sitting:   154 / 58  (left arm) Cuff size:   regular  Vitals Entered By: Stanton Kidney, EMT-P (Sep 15, 2009 3:59 PM)  Physical Exam  General:  well-appearing African American woman in no apparent distress, gained 8 pounds Head:  normocephalic and atraumatic Eyes:  PERRLA/EOM intact; conjunctiva and lids normal. Mouth:  MMM Neck:  Neck supple, no JVD. No masses, thyromegaly or abnormal cervical nodes. Chest Wall:  Well healed PPM incision. Lungs:  normal respiratory effort, no intercostal retractions, no accessory muscle use, and normal breath sounds.   Heart:  normal rate and regular rhythm.  very soft systolic  murmur Abdomen:  soft, non-tender, normal bowel sounds, no distention, and no guarding.  echymosis around bra line on upper abdomen. Msk:  both knees tender throughout, boggy, well healed surgical scars central,  Pulses:  diminished bilaterally Extremities:  trace pitting edema at ankles bilat Neurologic:  alert & oriented X3 and sensation intact to light touch.  uses hands to rise, stands before starting to walk and gait slow, using 1 point cane   PPM Specifications Following MD:  Lewayne Bunting, MD     PPM Vendor:  [employer] Jude     PPM Model Number:  [id number]  PPM Serial Number:  [id number] PPM DOI:  10/12/2007     PPM Implanting MD:  Lewayne Bunting, MD  Lead 1    Location: RA     DOI: 10/12/2007     Model #: [id number]     Serial #: [id number]     Status: active Lead 2    Location: RV     DOI: 10/12/2007     Model #: [id number]     Serial #: [id number]     Status: active  Magnet Response Rate:  BOL 98.6 ERI  86.3  Indications:  A-Flutter,Tachy-Brady Syndrome   PPM Follow Up Remote Check?  No Battery Voltage:  2.76 V     Battery Est. Longevity:  8.25-49YRS     Pacer Dependent:  No        PPM Device Measurements Atrium  Amplitude: 1.5 mV, Impedance: 323 ohms, Threshold: 0.50 V at 0.4 msec Right Ventricle  Amplitude: >12.0 mV, Impedance: 410 ohms, Threshold: 1.00 V at 0.4 msec  Episodes MS Episodes:  1525     Percent Mode Switch:  5%     Coumadin:  No Ventricular High Rate:  0     Atrial Pacing:  89%     Ventricular Pacing:  1.2%  Parameters Mode:  DDD     Lower Rate Limit:  60     Upper Rate Limit:  115 Paced AV Delay:  250     Sensed AV Delay:  225 Next Cardiology Appt Due:  03/17/2010 Tech Comments:  PT IN MODE SWITCH 5% OF TIME.  NORMAL DEVICE FUNCTION W/NO CHANGES MADE.  ROV IN 6 MTHS W/CLINIC-[place].  Vella Kohler MD Comments:  Agree with above.  Impression & Recommendations:  Problem # 1:  HYPERTENSION, BENIGN ESSENTIAL (ICD-401.1) Her blood pressure is elevated today.  I thought that along with her peripheral edema, stopping cardizem and starting carvedilol would be reasonable.  Will see her back in  several months.  A low sodium diet is recommended. The following medications were removed from the medication list:    Cardizem Cd 120 Mg Xr24h-cap (Diltiazem hcl coated beads) .[name]... Take 1 tablet by mouth once a day Her updated medication list for this problem includes:    Benazepril Hcl 20 Mg Tabs (Benazepril hcl) .[name]... Take 2 tablets by mouth daily    Aspirin 81 Mg Tabs (Aspirin) .[name]... Take 1 tablet by mouth once a day    Carvedilol 6.25 Mg Tabs (Carvedilol) .[name]... Take one tablet by mouth twice a day  Problem # 2:  CARDIAC PACEMAKER IN SITU (ICD-V45.01) Her device is working normally.  Will recheck in several months.    Problem # 3:  ATRIAL FIBRILLATION (ICD-427.31) She is out of rhythm about 5% of the time.  She has had a h/o bleeding and is not felt to be a coumadin candidate. Her updated medication list for this problem includes:    Aspirin 81 Mg Tabs (Aspirin) .[name]... Take 1 tablet by mouth once a day    Carvedilol 6.25 Mg Tabs (Carvedilol) .[name]...  Take one tablet by mouth twice a day  Patient Instructions: 1)  Your physician recommends that you schedule a follow-up appointment in: 6 months 2)  Your physician has recommended you make the following change in your medication: take Cardizem until bottle empty throw away bottle do not refill, then start taking Coreg 6.25mg  two times a day.  Prescriptions: CARVEDILOL 6.25 MG TABS (CARVEDILOL) Take one tablet by mouth twice a day  #60 x  6   Entered by:   Cloyde Reams RN   Authorized by:   Laren Boom, MD, [facility]   Signed by:   Cloyde Reams RN on 09/15/2009   Method used:   Electronically to        [facility]* (retail)       [address]       [place], [place]  [address]       Ph: [phone]       Fax: [phone]   RxID:   [id number]

## 2010-06-16 NOTE — Progress Notes (Signed)
Summary: blood sugar is elevated  Phone Note Call from Patient   Caller: grand daughter Varney Daily   [phone] Summary of Call: Pt's blood sugar  was 245 at 2:30. 296 at 4:15.  She took a dose of metformin this morning and another at 2:30, then level went up.  Is there anything else she should do?  Pt states she feels all right. Initial call taken by: Lowella Petties CMA,  March 03, 2010 4:20 PM  Follow-up for Phone Call        As long as she is feeling well, this is okay.  Avoid sweets/carbs in the meantime.  I would check AM glucose over the next few days.  She should have A1c at OV with Dr. Dayton Martes in the near future since she is due for this.  Thanks.  Follow-up by: Crawford Givens MD,  March 03, 2010 5:05 PM  Additional Follow-up for Phone Call Additional follow up Details #1::        Patient Advised.  Additional Follow-up by: Delilah Shan CMA Duncan Dull),  March 03, 2010 5:11 PM

## 2010-06-16 NOTE — Assessment & Plan Note (Signed)
Summary: hurting under breast right side since sunday   Vital Signs:  Patient profile:   77 year old female Weight:      167 pounds Temp:     97 .6 degrees F oral Pulse rate:   64 / minute BP sitting:   104 / 62  (left arm) Cuff size:   regular  Vitals Entered By: Lowella Petties CMA (December 03, 2009 10:57 AM) CC: Pain under right breast x 4 days.   History of Present Illness: 77 yo here for epigastric pain and RUQ pain x 4 days. Worsened by food, intermittent. Associated with nausea, no vomiting. No fevers or chills.  Had something similar to this years ago but cannot remember what they told her it was.  Does not believe her gallbladder was removed.  No CP or SOB. No recent known trauma or injury.  Current Medications (verified): 1)  Zocor 40 Mg  Tabs (Simvastatin) .... Take 1 Tablet By Mouth Once A Day 2)  Benazepril Hcl 20 Mg Tabs (Benazepril Hcl) .... Take 2 Tablets By Mouth Daily 3)  Aleve 220 Mg Tabs (Naproxen Sodium) .... As Needed 4)  Aspirin 81 Mg  Tabs (Aspirin) .... Take 1 Tablet By Mouth Once A Day 5)  Mirtazapine 7.5 Mg Tabs (Mirtazapine) .[name].. 1 Tab By Mouth Qhs 6)  Metformin Hcl 500 Mg Tabs (Metformin Hcl) .... Take 1 Tab Twice Daily 7)  Carvedilol 6.25 Mg Tabs (Carvedilol) .... Take One Tablet By Mouth Twice A Day  Allergies (verified): 1)  ! Naprosyn  Past History:  Past Medical History: Last updated: 09/30/2008 Hyperlipidemia Myocardial infarction, hx of Diabetes mellitus, type II CAD PVD Symptomatic bradycardia Paroxysmal atrial fibrillation Hx of A flutter Hx of chronic diastolic heart failure Hx of nonobstructive carotid disease Arthritis  Family History: Last updated: 05-06-2007 Father: Died of unknown causes Mother: Deceased, HTN Siblings: 1 brother, HTN, CAD,                2 sisters alive with HTN    CV + brother HBP + DM + Arthritis + patient No cancer Depression ?  Social History: Last updated: 05/22/2008 Marital Status:  Widow, husband died at age 61, DM, HTN Children: 3 Occupation: Agricultural engineer in the past, active in church --son lives with her and does most ot the housework and cooking--1/10  Risk Factors: Smoking Status: current (05/22/2008) Packs/Day: 5-8cigs qd (05/22/2008)  Past Surgical History:  Right knee replacement 1998 Left knee replacement 12/99 Right hip replacement 1997 Left hip replacement 11/01 Pituitary tumor removed- [facility] - benign 12/02 Recurrent pituitary tumor - [facility] 01/08 Myocardial perfusion study, EF 59% 04/04 Adenosine Myoview study - abn EF 53% 12/04/04 Carotid doppler 02/99 ECHO with [facility] ( old records) 01/98 Stress cardiolite - EF 53% 03/04 CATH some obstructive dz 05/22/06 CATH - MI, stent 3/08 GAMMA knife radiosurg  ([facility])--08/25/06 PVD with stent in the right iliac  Family History: Reviewed history from 05-06-07 and no changes required. Father: Died of unknown causes Mother: Deceased, HTN Siblings: 1 brother, HTN, CAD,                2 sisters alive with HTN    CV + brother HBP + DM + Arthritis + patient No cancer Depression ?  Social History: Reviewed history from 05/22/2008 and no changes required. 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 CV:  Denies chest pain or discomfort. GI:  Complains of abdominal pain and vomiting; denies bloody stools, change in bowel habits, and nausea.  Physical Exam  General:  well-appearing African American woman, obviously uncomfortable. VSS, non toxic appearing. Mouth:  MMM Abdomen:  soft, non-tender, normal bowel sounds, no distention, and no guarding.  echymosis around bra line on upper abdomen. murphy's sign  neg. Extremities:  trace pitting edema at ankles bilat  Psych:  normally interactive, flat affect, and subdued.     Impression & Recommendations:  Problem # 1:  ABDOMINAL PAIN RIGHT UPPER QUADRANT (ICD-789.01) Assessment New Etiology  unknown, does not currently have an acute abdomen.  She believes that she still has a gall bladder and based on her description, it does seem consistent with either gall stones or cholecystitis.   Cannot rule out pancreatitis ,although less likely. Will check CBC, lipase, hepatic panel. Ultrasound of abdomen. Orders: Venipuncture (04540) TLB-Hepatic/Liver Function Pnl (80076-HEPATIC) TLB-CBC Platelet - w/Differential (85025-CBCD) TLB-Lipase (83690-LIPASE) Radiology Referral (Radiology)  Complete Medication List: 1)  Zocor 40 Mg Tabs (Simvastatin) .... Take 1 tablet by mouth once a day 2)  Benazepril Hcl 20 Mg Tabs (Benazepril hcl) .... Take 2 tablets by mouth daily 3)  Aleve 220 Mg Tabs (Naproxen sodium) .... As needed 4)  Aspirin 81 Mg Tabs (Aspirin) .... Take 1 tablet by mouth once a day 5)  Mirtazapine 7.5 Mg Tabs (Mirtazapine) .[name].. 1 tab by mouth qhs 6)  Metformin Hcl 500 Mg Tabs (Metformin hcl) .... Take 1 tab twice daily 7)  Carvedilol 6.25 Mg Tabs (Carvedilol) .... Take one tablet by mouth twice a day  Patient Instructions: 1)  Please stop by to see Shirlee Limerick on your way out.  Prior Medications (reviewed today): ZOCOR 40 MG  TABS (SIMVASTATIN) Take 1 tablet by mouth once a day BENAZEPRIL HCL 20 MG TABS (BENAZEPRIL HCL) Take 2 tablets by mouth daily ALEVE 220 MG TABS (NAPROXEN SODIUM) as needed ASPIRIN 81 MG  TABS (ASPIRIN) Take 1 tablet by mouth once a day MIRTAZAPINE 7.5 MG TABS (MIRTAZAPINE) 1 tab by mouth qhs METFORMIN HCL 500 MG TABS (METFORMIN HCL) Take 1 tab twice daily CARVEDILOL 6.25 MG TABS (CARVEDILOL) Take one tablet by mouth twice a day Current Allergies (reviewed today): ! NAPROSYN

## 2010-06-16 NOTE — Assessment & Plan Note (Signed)
Summary: 30 MIN MOBILITY EVAL SCOOTER STORE   Vital Signs:  Patient profile:   77 year old female Height:      63 inches Weight:      177.38 pounds BMI:     31.54 Temp:     98.6 degrees F oral Pulse rate:   68 / minute Pulse rhythm:   regular BP sitting:   130 / 80  (left arm) Cuff size:   regular  Vitals Entered By: Linde Gillis CMA Duncan Dull) (October 23, 2009 3:08 PM) CC: 30 minute appt, mobility evaluation    History of Present Illness: 77 yo here for mobility evaluation, has forms for power scooter.  Unable to to walk more than 2 steps without some type of assistance.   She no longer feels stable with cane and walker because her upper body is weaker and weaker.  Has had bilateral hip and knee replacement. Also gets very faitgued when walking.  Needs someone to support or help her with all ADLs, including bathing, dressing because of how unstable she is on her feet.   H/o falls, most recently last year.    Current Medications (verified): 1)  Zocor 40 Mg  Tabs (Simvastatin) .... Take 1 Tablet By Mouth Once A Day 2)  Benazepril Hcl 20 Mg Tabs (Benazepril Hcl) .... Take 2 Tablets By Mouth Daily 3)  Aleve 220 Mg Tabs (Naproxen Sodium) .... As Needed 4)  Aspirin 81 Mg  Tabs (Aspirin) .... Take 1 Tablet By Mouth Once A Day 5)  Mirtazapine 7.5 Mg Tabs (Mirtazapine) .[name].. 1 Tab By Mouth Qhs 6)  Metformin Hcl 500 Mg Tabs (Metformin Hcl) .... Take 1 Tab Twice Daily 7)  Carvedilol 6.25 Mg Tabs (Carvedilol) .... Take One Tablet By Mouth Twice A Day  Allergies: 1)  ! Naprosyn  Review of Systems      See HPI CV:  Denies chest pain or discomfort. Resp:  Denies shortness of breath. MS:  Complains of joint pain, loss of strength, and muscle weakness; denies joint redness, joint swelling, low back pain, mid back pain, muscle aches, muscle, cramps, stiffness, and thoracic pain.  Physical Exam  General:  well-appearing African American woman in no apparent distress Mouth:   MMM Extremities:  trace pitting edema at ankles bilat  Neurologic:  alert & oriented X3 and cranial nerves II-XII intact.   Walks with walker, shuffles Psych:  normally interactive, flat affect, and subdued.     Impression & Recommendations:  Problem # 1:  OTHER SPECIFIED EXAMINATION (ICD-V72.85) Assessment New  Mobility evaluation complete and forms filled out. I do agree that with her sense of her upper body weakening, that she could benefit from a power scooter (not necessarily a power wheelchair).  Orders: Form Completion (27253)  Complete Medication List: 1)  Zocor 40 Mg Tabs (Simvastatin) .... Take 1 tablet by mouth once a day 2)  Benazepril Hcl 20 Mg Tabs (Benazepril hcl) .... Take 2 tablets by mouth daily 3)  Aleve 220 Mg Tabs (Naproxen sodium) .... As needed 4)  Aspirin 81 Mg Tabs (Aspirin) .... Take 1 tablet by mouth once a day 5)  Mirtazapine 7.5 Mg Tabs (Mirtazapine) .[name].. 1 tab by mouth qhs 6)  Metformin Hcl 500 Mg Tabs (Metformin hcl) .... Take 1 tab twice daily 7)  Carvedilol 6.25 Mg Tabs (Carvedilol) .... Take one tablet by mouth twice a day  Current Allergies (reviewed today): ! NAPROSYN  Appended Document: 30 MIN MOBILITY EVAL SCOOTER STORE  Allergies: 1)  ! Naprosyn   Complete Medication List: 1)  Zocor 40 Mg Tabs (Simvastatin) .... Take 1 tablet by mouth once a day 2)  Benazepril Hcl 20 Mg Tabs (Benazepril hcl) .... Take 2 tablets by mouth daily 3)  Aleve 220 Mg Tabs (Naproxen sodium) .... As needed 4)  Aspirin 81 Mg Tabs (Aspirin) .... Take 1 tablet by mouth once a day 5)  Mirtazapine 7.5 Mg Tabs (Mirtazapine) .[name].. 1 tab by mouth qhs 6)  Metformin Hcl 500 Mg Tabs (Metformin hcl) .... Take 1 tab twice daily 7)  Carvedilol 6.25 Mg Tabs (Carvedilol) .... Take one tablet by mouth twice a day  Appended Document: 30 MIN MOBILITY EVAL SCOOTER STORE

## 2010-06-18 NOTE — Assessment & Plan Note (Signed)
Summary: PAIN IN LEGS AND KNEES/CLE   Vital Signs:  Patient profile:   77 year old female Height:      63 inches Weight:      155.50 pounds BMI:     27.65 Temp:     97.8 degrees F oral Pulse rate:   76 / minute Pulse rhythm:   regular BP sitting:   130 / 70  (left arm) Cuff size:   regular  Vitals Entered By: Linde Gillis CMA Duncan Dull) (May 21, 2010 12:21 PM) CC: bilateral knee pain   History of Present Illness: 77 yo here for continued bilateral knee pain.  Has known severe OA, s/p bilateral knee replacements ( 1998, 1999). Over past several months, having more breakthrough pain, especially at night. Tramadol and Tylenol used to help, but now pain is worsening. No fever or chills. No recent falls.  Current Medications (verified): 1)  Zocor 40 Mg  Tabs (Simvastatin) .... Take 1 Tablet By Mouth Once A Day 2)  Benazepril Hcl 20 Mg Tabs (Benazepril Hcl) .... Take 2 Tablets By Mouth Daily 3)  Aleve 220 Mg Tabs (Naproxen Sodium) .... As Needed 4)  Aspirin 81 Mg  Tabs (Aspirin) .... Take 1 Tablet By Mouth Once A Day 5)  Metformin Hcl 500 Mg Tabs (Metformin Hcl) .... Take 1 Tab Twice Daily 6)  Carvedilol 6.25 Mg Tabs (Carvedilol) .... Take One Tablet By Mouth Twice A Day 7)  Tramadol Hcl 50 Mg  Tabs (Tramadol Hcl) .[name].. 1-2 Tab By Mouth Two Times A Day As Needed Pain 8)  Accu-Chek Aviva  Strp (Glucose Blood) .... Check Blood Sugar 2-3 Times Per Day As Directed 9)  Ensure  Liqd (Nutritional Supplements) .... Drink Two To Three Times Daily 10)  Proventil Hfa 108 (90 Base) Mcg/act Aers (Albuterol Sulfate) .... Inhale Two Puffs Every Four Hous As Needed 11)  Vicodin 5-500 Mg Tabs (Hydrocodone-Acetaminophen) .[name].. 1 Tablet Every 6 Hours As Needed.  Allergies: 1)  ! Naprosyn  Review of Systems      See HPI MS:  Denies muscle weakness.  Physical Exam  General:  well-appearing African American woman Msk:  bilateral old scars from knee replacements bilaterally, some pain with  motion, no redness or warmth Psych:  normally interactive, flat affect, and subdued.     Impression & Recommendations:  Problem # 1:  DEGENERATIVE JOINT DISEASE, ADVANCED (ICD-715.90) Assessment Deteriorated  Time spent with patient and her neice 25 minutes, more than 50% of this time was spent counseling patient on end stage OA.  Can refer back to ortho but unlikely to be a candidate for surgery given her cardiac issues.  Pt does not want ortho referral.  Will try adding Vicodin.  Discussed importance of NOT taking Tylenol.  Also can worsen constipation and fall risk.  Pt aware and would like to take risk.   Her updated medication list for this problem includes:    Aleve 220 Mg Tabs (Naproxen sodium) .[name]... As needed    Aspirin 81 Mg Tabs (Aspirin) .[name]... Take 1 tablet by mouth once a day    Tramadol Hcl 50 Mg Tabs (Tramadol hcl) .[name]... 1-2 tab by mouth two times a day as needed pain    Vicodin 5-500 Mg Tabs (Hydrocodone-acetaminophen) .[name]... 1 tablet every 6 hours as needed.  Orders: Prescription Created Electronically [phone])  Complete Medication List: 1)  Zocor 40 Mg Tabs (Simvastatin) .... Take 1 tablet by mouth once a day 2)  Benazepril Hcl 20 Mg Tabs (  Benazepril hcl) .... Take 2 tablets by mouth daily 3)  Aleve 220 Mg Tabs (Naproxen sodium) .... As needed 4)  Aspirin 81 Mg Tabs (Aspirin) .... Take 1 tablet by mouth once a day 5)  Metformin Hcl 500 Mg Tabs (Metformin hcl) .... Take 1 tab twice daily 6)  Carvedilol 6.25 Mg Tabs (Carvedilol) .... Take one tablet by mouth twice a day 7)  Tramadol Hcl 50 Mg Tabs (Tramadol hcl) .[name].. 1-2 tab by mouth two times a day as needed pain 8)  Accu-chek Aviva Strp (Glucose blood) .... Check blood sugar 2-3 times per day as directed 9)  Ensure Liqd (Nutritional supplements) .... Drink two to three times daily 10)  Proventil Hfa 108 (90 Base) Mcg/act Aers (Albuterol sulfate) .... Inhale two puffs every four hous as needed 11)  Vicodin 5-500 Mg Tabs  (Hydrocodone-acetaminophen) .[name].. 1 tablet every 6 hours as needed. Prescriptions: VICODIN 5-500 MG TABS (HYDROCODONE-ACETAMINOPHEN) 1 tablet every 6 hours as needed.  #60 x 0   Entered and Authorized by:   Ruthe Mannan MD   Signed by:   Ruthe Mannan MD on 05/21/2010   Method used:   Print then Give to Patient   RxID:   [id number] TRAMADOL HCL 50 MG  TABS (TRAMADOL HCL) 1-2 tab by mouth two times a day as needed pain  #60 x 6   Entered and Authorized by:   Ruthe Mannan MD   Signed by:   Ruthe Mannan MD on 05/21/2010   Method used:   Electronically to        [facility]* (retail)       [address]       [place], [place]  [address]       Ph: [phone]       Fax: [phone]   RxID:   [id number] CARVEDILOL 6.25 MG TABS (CARVEDILOL) Take one tablet by mouth twice a day  #60 x 6   Entered and Authorized by:   Ruthe Mannan MD   Signed by:   Ruthe Mannan MD on 05/21/2010   Method used:   Electronically to        [facility]* (retail)       [address]       [place], [place]  [address]       Ph: [phone]       Fax: [phone]   RxID:   [id number] METFORMIN HCL 500 MG TABS (METFORMIN HCL) Take 1 tab twice daily  #60 x 11   Entered and Authorized by:   Ruthe Mannan MD   Signed by:   Ruthe Mannan MD on 05/21/2010   Method used:   Electronically to        [facility]* (retail)       [address]       [place], [place]  [address]       Ph: [phone]       Fax: [phone]   RxID:   [id number] BENAZEPRIL HCL 20 MG TABS (BENAZEPRIL HCL) Take 2 tablets by mouth daily  #60 x 11   Entered and Authorized by:   Ruthe Mannan MD   Signed by:   Ruthe Mannan MD on 05/21/2010   Method used:   Electronically to        [facility]* (retail)       [address]       [place], [place]  [address]       Ph: [phone]       Fax:  [phone]   RxID:   [id number] ZOCOR 40 MG  TABS (SIMVASTATIN) Take 1 tablet by mouth once a day  #30 x 11   Entered and  Authorized by:   Ruthe Mannan MD   Signed by:   Ruthe Mannan MD on 05/21/2010   Method used:   Electronically to        [facility]* (retail)       [address]       [place], [place]  [address]       Ph: [phone]       Fax: [phone]   RxID:   [id number]    Orders Added: 1)  Prescription Created Electronically [G8553] 2)  Est. Patient Level IV [95638]    Current Allergies (reviewed today): ! NAPROSYN

## 2010-06-24 ENCOUNTER — Telehealth: Payer: Self-pay | Admitting: Family Medicine

## 2010-07-02 NOTE — Progress Notes (Signed)
Summary: vicodin  Phone Note Refill Request Message from:  Fax from Pharmacy on June 24, 2010 10:29 AM  Refills Requested: Medication #1:  VICODIN 5-500 MG TABS 1 tablet every 6 hours as needed..   Last Refilled: 05/21/2010 Refill request from [facility] pharmacy.[phone].  Initial call taken by: Melody Comas,  June 24, 2010 10:32 AM  Follow-up for Phone Call        Rx called to pharmacy Follow-up by: Linde Gillis CMA Duncan Dull),  June 24, 2010 10:47 AM    Prescriptions: VICODIN 5-500 MG TABS (HYDROCODONE-ACETAMINOPHEN) 1 tablet every 6 hours as needed.  #60 x 0   Entered and Authorized by:   Ruthe Mannan MD   Signed by:   Ruthe Mannan MD on 06/24/2010   Method used:   Telephoned to ...       [facility]* (retail)       [address]       [place], [place]  [address]       Ph: [phone]       Fax: [phone]   RxID:   [id number]

## 2010-07-20 ENCOUNTER — Telehealth: Payer: Self-pay | Admitting: Family Medicine

## 2010-07-28 NOTE — Progress Notes (Signed)
Summary: refill request for vicodin  Phone Note Refill Request Message from:  Fax from Pharmacy  Refills Requested: Medication #1:  VICODIN 5-500 MG TABS 1 tablet every 6 hours as needed..   Last Refilled: 06/24/2010 Faxed request from [facility] pharmacy. [phone].  Initial call taken by: Lowella Petties CMA, AAMA,  July 20, 2010 11:51 AM  Follow-up for Phone Call        Rx called to pharmacy Follow-up by: Linde Gillis CMA Duncan Dull),  July 20, 2010 2:14 PM    Prescriptions: VICODIN 5-500 MG TABS (HYDROCODONE-ACETAMINOPHEN) 1 tablet every 6 hours as needed.  #60 x 0   Entered and Authorized by:   Ruthe Mannan MD   Signed by:   Ruthe Mannan MD on 07/20/2010   Method used:   Telephoned to ...       [facility]* (retail)       [address]       [place], [place]  [address]       Ph: [phone]       Fax: [phone]   RxID:   [id number]

## 2010-07-29 LAB — COMPREHENSIVE METABOLIC PANEL
BUN: 11 mg/dL (ref 6–23)
CO2: 29 mEq/L (ref 19–32)
Calcium: 8.5 mg/dL (ref 8.4–10.5)
Creatinine, Ser: 1.01 mg/dL (ref 0.4–1.2)
GFR calc Af Amer: >60 mL/min (ref >60)
GFR calc non Af Amer: 52 mL/min — ABNORMAL LOW (ref >60)
Glucose, Bld: 100 mg/dL — ABNORMAL HIGH (ref 70–99)

## 2010-07-29 LAB — CARDIAC PANEL(CRET KIN+CKTOT+MB+TROPI): Total CK: 28 U/L (ref 7–177)

## 2010-07-29 LAB — CBC
HCT: 35.5 % — ABNORMAL LOW (ref 36.0–46.0)
HCT: 38.7 % (ref 36.0–46.0)
Hemoglobin: 13 g/dL (ref 12.0–15.0)
MCH: 32.3 pg (ref 26.0–34.0)
MCHC: 33.6 g/dL (ref 30.0–36.0)
MCHC: 33.7 g/dL (ref 30.0–36.0)
Platelets: 275 10*3/uL (ref 150–400)
RDW: 15.7 % — ABNORMAL HIGH (ref 11.5–15.5)

## 2010-07-29 LAB — GLUCOSE, CAPILLARY
Glucose-Capillary: 108 mg/dL — ABNORMAL HIGH (ref 70–99)
Glucose-Capillary: 152 mg/dL — ABNORMAL HIGH (ref 70–99)
Glucose-Capillary: 72 mg/dL (ref 70–99)
Glucose-Capillary: 83 mg/dL (ref 70–99)

## 2010-07-29 LAB — BASIC METABOLIC PANEL
BUN: 10 mg/dL (ref 6–23)
Calcium: 8.2 mg/dL — ABNORMAL LOW (ref 8.4–10.5)
Creatinine, Ser: 0.99 mg/dL (ref 0.4–1.2)
GFR calc non Af Amer: 53 mL/min — ABNORMAL LOW (ref >60)
Glucose, Bld: 80 mg/dL (ref 70–99)

## 2010-07-29 LAB — MAGNESIUM: Magnesium: 1.8 mg/dL (ref 1.5–2.5)

## 2010-07-29 LAB — PHOSPHORUS: Phosphorus: 4 mg/dL (ref 2.3–4.6)

## 2010-07-30 ENCOUNTER — Ambulatory Visit: Payer: Medicare Other | Admitting: Family Medicine

## 2010-07-30 ENCOUNTER — Telehealth: Payer: Self-pay | Admitting: Internal Medicine

## 2010-07-30 LAB — URINE MICROSCOPIC-ADD ON

## 2010-07-30 LAB — COMPREHENSIVE METABOLIC PANEL
Albumin: 3.5 g/dL (ref 3.5–5.2)
BUN: 15 mg/dL (ref 6–23)
CO2: 27 mEq/L (ref 19–32)
Chloride: 103 mEq/L (ref 96–112)
Creatinine, Ser: 1.12 mg/dL (ref 0.4–1.2)
GFR calc non Af Amer: 46 mL/min — ABNORMAL LOW (ref >60)
Total Bilirubin: 0.3 mg/dL (ref 0.3–1.2)

## 2010-07-30 LAB — DIFFERENTIAL
Basophils Absolute: 0 10*3/uL (ref 0.0–0.1)
Lymphocytes Relative: 8 % — ABNORMAL LOW (ref 12–46)
Neutro Abs: 12.9 10*3/uL — ABNORMAL HIGH (ref 1.7–7.7)
Neutrophils Relative %: 86 % — ABNORMAL HIGH (ref 43–77)

## 2010-07-30 LAB — URINALYSIS, ROUTINE W REFLEX MICROSCOPIC
Nitrite: NEGATIVE
Specific Gravity, Urine: 1.023 (ref 1.005–1.030)
Urobilinogen, UA: 1 mg/dL (ref 0.0–1.0)

## 2010-07-30 LAB — URINE CULTURE: Culture  Setup Time: 201110150113

## 2010-07-30 LAB — CBC
MCH: 32.7 pg (ref 26.0–34.0)
MCV: 96.5 fL (ref 78.0–100.0)
Platelets: 365 10*3/uL (ref 150–400)
RBC: 4.45 MIL/uL (ref 3.87–5.11)

## 2010-07-30 LAB — CK TOTAL AND CKMB (NOT AT ARMC)
CK, MB: 0.8 ng/mL (ref 0.3–4.0)
Total CK: 32 U/L (ref 7–177)

## 2010-07-30 LAB — GLUCOSE, CAPILLARY: Glucose-Capillary: 91 mg/dL (ref 70–99)

## 2010-07-30 LAB — TROPONIN I: Troponin I: 0.05 ng/mL (ref 0.00–0.06)

## 2010-07-30 LAB — LIPASE, BLOOD: Lipase: 17 U/L (ref 11–59)

## 2010-08-01 LAB — CBC
MCHC: 33.9 g/dL (ref 30.0–36.0)
MCV: 95.5 fL (ref 78.0–100.0)
Platelets: 362 10*3/uL (ref 150–400)
RDW: 14.1 % (ref 11.5–15.5)
WBC: 9 10*3/uL (ref 4.0–10.5)

## 2010-08-01 LAB — COMPREHENSIVE METABOLIC PANEL
Albumin: 3.4 g/dL — ABNORMAL LOW (ref 3.5–5.2)
Alkaline Phosphatase: 79 U/L (ref 39–117)
BUN: 36 mg/dL — ABNORMAL HIGH (ref 6–23)
Calcium: 8.6 mg/dL (ref 8.4–10.5)
Potassium: 3.8 mEq/L (ref 3.5–5.1)
Total Protein: 6.8 g/dL (ref 6.0–8.3)

## 2010-08-01 LAB — DIFFERENTIAL
Basophils Absolute: 0.1 10*3/uL (ref 0.0–0.1)
Eosinophils Relative: 2 % (ref 0–5)
Lymphocytes Relative: 30 % (ref 12–46)
Lymphs Abs: 2.7 10*3/uL (ref 0.7–4.0)
Monocytes Absolute: 0.8 10*3/uL (ref 0.1–1.0)
Monocytes Relative: 9 % (ref 3–12)
Neutro Abs: 5.2 10*3/uL (ref 1.7–7.7)

## 2010-08-01 LAB — URINALYSIS, ROUTINE W REFLEX MICROSCOPIC
Glucose, UA: NEGATIVE mg/dL
Hgb urine dipstick: NEGATIVE
Ketones, ur: 15 mg/dL — AB
pH: 5.5 (ref 5.0–8.0)

## 2010-08-01 LAB — POCT CARDIAC MARKERS
CKMB, poc: 1.7 ng/mL (ref 1.0–8.0)
Myoglobin, poc: 206 ng/mL (ref 12–200)
Myoglobin, poc: 300 ng/mL (ref 12–200)
Troponin i, poc: <0.05 ng/mL (ref 0.00–0.09)

## 2010-08-01 LAB — URINE CULTURE

## 2010-08-01 LAB — URINE MICROSCOPIC-ADD ON

## 2010-08-04 NOTE — Progress Notes (Signed)
Summary: sur clearance by march 19   Phone Note From Other Clinic Call back at [facility] Phone [phone] [facility]   Call back at cell [phone]   Caller: Patient Caller: Nurse/dr aron office [phone] Summary of Call: pt needs sur clearance for march 19th for eye surgery.  dr. Dayton Martes will be faxing sur clearance papers. Initial call taken by: Roe Coombs,  July 30, 2010 12:12 PM  Follow-up for Phone Call        done and faxed Dennis Bast, RN, BSN  July 30, 2010 5:55 PM

## 2010-08-05 LAB — BASIC METABOLIC PANEL
CO2: 29 mEq/L (ref 19–32)
Chloride: 105 mEq/L (ref 96–112)
GFR calc Af Amer: 59 mL/min — ABNORMAL LOW (ref >60)
Sodium: 142 mEq/L (ref 135–145)

## 2010-08-05 LAB — CBC
Hemoglobin: 14.2 g/dL (ref 12.0–15.0)
MCHC: 33.7 g/dL (ref 30.0–36.0)
MCV: 97.5 fL (ref 78.0–100.0)
RBC: 4.31 MIL/uL (ref 3.87–5.11)

## 2010-08-05 LAB — URINALYSIS, ROUTINE W REFLEX MICROSCOPIC
Bilirubin Urine: NEGATIVE
Hgb urine dipstick: NEGATIVE
Protein, ur: 100 mg/dL — AB
Urobilinogen, UA: 0.2 mg/dL (ref 0.0–1.0)

## 2010-08-05 LAB — DIFFERENTIAL
Basophils Relative: 1 % (ref 0–1)
Eosinophils Absolute: 0.1 10*3/uL (ref 0.0–0.7)
Eosinophils Relative: 2 % (ref 0–5)
Monocytes Absolute: 0.9 10*3/uL (ref 0.1–1.0)
Monocytes Relative: 10 % (ref 3–12)

## 2010-08-05 LAB — URINE MICROSCOPIC-ADD ON

## 2010-08-05 LAB — POCT CARDIAC MARKERS
CKMB, poc: <1 ng/mL — ABNORMAL LOW (ref 1.0–8.0)
Myoglobin, poc: 78.6 ng/mL (ref 12–200)

## 2010-09-20 ENCOUNTER — Inpatient Hospital Stay (HOSPITAL_COMMUNITY)
Admission: EM | Admit: 2010-09-20 | Discharge: 2010-09-21 | DRG: 313 | Disposition: A | Discharge status: Home / Self Care | Payer: Medicare Other | Attending: Internal Medicine | Admitting: Internal Medicine

## 2010-09-20 DIAGNOSIS — I4891 Unspecified atrial fibrillation: Secondary | ICD-10-CM | POA: Diagnosis present

## 2010-09-20 DIAGNOSIS — E785 Hyperlipidemia, unspecified: Secondary | ICD-10-CM | POA: Diagnosis present

## 2010-09-20 DIAGNOSIS — R079 Chest pain, unspecified: Secondary | ICD-10-CM

## 2010-09-20 DIAGNOSIS — R5381 Other malaise: Secondary | ICD-10-CM

## 2010-09-20 DIAGNOSIS — Z79899 Other long term (current) drug therapy: Secondary | ICD-10-CM

## 2010-09-20 DIAGNOSIS — R0789 Other chest pain: Principal | ICD-10-CM | POA: Diagnosis present

## 2010-09-20 DIAGNOSIS — Z7982 Long term (current) use of aspirin: Secondary | ICD-10-CM

## 2010-09-20 DIAGNOSIS — J4489 Other specified chronic obstructive pulmonary disease: Secondary | ICD-10-CM | POA: Diagnosis present

## 2010-09-20 DIAGNOSIS — I5032 Chronic diastolic (congestive) heart failure: Secondary | ICD-10-CM | POA: Diagnosis present

## 2010-09-20 DIAGNOSIS — F17200 Nicotine dependence, unspecified, uncomplicated: Secondary | ICD-10-CM | POA: Diagnosis present

## 2010-09-20 DIAGNOSIS — I658 Occlusion and stenosis of other precerebral arteries: Secondary | ICD-10-CM | POA: Diagnosis present

## 2010-09-20 DIAGNOSIS — M129 Arthropathy, unspecified: Secondary | ICD-10-CM | POA: Diagnosis present

## 2010-09-20 DIAGNOSIS — I739 Peripheral vascular disease, unspecified: Secondary | ICD-10-CM | POA: Diagnosis present

## 2010-09-20 DIAGNOSIS — I129 Hypertensive chronic kidney disease with stage 1 through stage 4 chronic kidney disease, or unspecified chronic kidney disease: Secondary | ICD-10-CM | POA: Diagnosis present

## 2010-09-20 DIAGNOSIS — I509 Heart failure, unspecified: Secondary | ICD-10-CM | POA: Diagnosis present

## 2010-09-20 DIAGNOSIS — Z950 Presence of cardiac pacemaker: Secondary | ICD-10-CM

## 2010-09-20 DIAGNOSIS — I6529 Occlusion and stenosis of unspecified carotid artery: Secondary | ICD-10-CM | POA: Diagnosis present

## 2010-09-20 DIAGNOSIS — R5383 Other fatigue: Secondary | ICD-10-CM

## 2010-09-20 DIAGNOSIS — J449 Chronic obstructive pulmonary disease, unspecified: Secondary | ICD-10-CM | POA: Diagnosis present

## 2010-09-20 DIAGNOSIS — Z9861 Coronary angioplasty status: Secondary | ICD-10-CM

## 2010-09-20 DIAGNOSIS — Z96649 Presence of unspecified artificial hip joint: Secondary | ICD-10-CM

## 2010-09-20 DIAGNOSIS — N183 Chronic kidney disease, stage 3 unspecified: Secondary | ICD-10-CM | POA: Diagnosis present

## 2010-09-20 DIAGNOSIS — Z96659 Presence of unspecified artificial knee joint: Secondary | ICD-10-CM

## 2010-09-20 DIAGNOSIS — I252 Old myocardial infarction: Secondary | ICD-10-CM

## 2010-09-20 DIAGNOSIS — E119 Type 2 diabetes mellitus without complications: Secondary | ICD-10-CM | POA: Diagnosis present

## 2010-09-20 DIAGNOSIS — I2510 Atherosclerotic heart disease of native coronary artery without angina pectoris: Secondary | ICD-10-CM | POA: Diagnosis present

## 2010-09-20 LAB — CARDIAC PANEL(CRET KIN+CKTOT+MB+TROPI): Troponin I: <0.3 ng/mL (ref <0.30)

## 2010-09-20 LAB — POCT I-STAT, CHEM 8
Glucose, Bld: 92 mg/dL (ref 70–99)
HCT: 44 % (ref 36.0–46.0)
Hemoglobin: 15 g/dL (ref 12.0–15.0)
Potassium: 4.4 mEq/L (ref 3.5–5.1)
Sodium: 137 mEq/L (ref 135–145)

## 2010-09-20 LAB — POCT CARDIAC MARKERS: Troponin i, poc: <0.05 ng/mL (ref 0.00–0.09)

## 2010-09-20 LAB — CK TOTAL AND CKMB (NOT AT ARMC)
CK, MB: 1 ng/mL (ref 0.3–4.0)
Total CK: 21 U/L (ref 7–177)

## 2010-09-20 LAB — APTT: aPTT: 27 seconds (ref 24–37)

## 2010-09-20 LAB — TROPONIN I: Troponin I: <0.3 ng/mL (ref <0.30)

## 2010-09-21 ENCOUNTER — Inpatient Hospital Stay (HOSPITAL_COMMUNITY): Payer: Medicare Other

## 2010-09-21 LAB — CARDIAC PANEL(CRET KIN+CKTOT+MB+TROPI): Relative Index: INVALID (ref 0.0–2.5)

## 2010-09-21 LAB — GLUCOSE, CAPILLARY
Glucose-Capillary: 115 mg/dL — ABNORMAL HIGH (ref 70–99)
Glucose-Capillary: 120 mg/dL — ABNORMAL HIGH (ref 70–99)

## 2010-09-29 NOTE — Progress Notes (Signed)
[facility] OFFICE NOTE   NAME:Futch, EYVETTE CORDON                    MRN:          [id number]  DATE:03/04/2008                            DOB:          06-Jul-1923    Ms. Filippone returns today for followup.  She is very pleasant woman with  a history of symptomatic bradycardias status post pacemaker insertion.  She also has a history of diastolic heart failure and tachy-brady  syndrome.  She returns today for followup.  She has done fairly well.  She was started on amiodarone and her dose is now down to 200 mg daily.  She returns today for additional evaluation.  She has a very minimal  amounts of dyspnea and rare dizziness, but no frank syncope.  She denies  chest pain.   CURRENT MEDICATIONS:  1. Aspirin 325 a day.  2. Simvastatin 40 a day.  3. Potassium 20 a day.  4. Diltiazem 120 a day.  5. Amiodarone 200 a day.  6. Iron sulfate 325 a day.  7. Metformin 500 twice a day.  8. Metoprolol 25 daily.   PHYSICAL EXAMINATION:  GENERAL:  She is a pleasant, well-appearing  elderly woman in no distress.  VITAL SIGNS:  Blood pressure was 99/65, the pulse 74 and regular,  respirations were 18, weight was 166 pounds.  NECK:  No jugular venous distention.  LUNGS:  Clear bilaterally to auscultation.  No wheezes, rales, or  rhonchi are present.  CARDIOVASCULAR:  Regular rate and rhythm.  Normal S1 and S2.  EXTREMITIES:  No edema.   Interrogation of her pacemaker demonstrates a St. Jude Zephyr with P and  R were 1 and 7 respectively.  Impedance was 344 in the A, 476 in the V.  The threshold 0.75 at 0.4 in the A and in the V.  The battery voltage  was 2.8 V.  She was 95% A paced.  She was mode switched 1.8% of the  time.   IMPRESSION:  1. Symptomatic bradycardia.  2. Tachy-brady syndrome with brief episodes of paroxysmal atrial      fibrillation.  3. History of atrial flutter status post ablation.  4.  History of anemia.   DISCUSSION:  Ms. Inzunza is stable.  She does have some AFib but had her  Coumadin stopped because of her concerns about anemia and difficulty  with followup.  I think at this point keep her  on full-strength aspirin would be a reasonable strategy for  thromboembolic prevention.  I will plan to see the patient back in the  office for pacemaker followup in May 2010.     Doylene Canning. Ladona Ridgel, MD  Electronically Signed    GWT/MedQ  DD: 03/04/2008  DT: 03/05/2008  Job #: [id number]   cc:   Billie D. Bean, FNP

## 2010-09-29 NOTE — Op Note (Signed)
NAME:  [name], [name]             ACCOUNT NO.:  [id number]   MEDICAL RECORD NO.:  [id number]          PATIENT TYPE:  INP   LOCATION:  4731                         FACILITY:  [facility]   PHYSICIAN:  Doylene Canning. Ladona Ridgel, MD    DATE OF BIRTH:  11-22-1923   DATE OF PROCEDURE:  10/12/2007  DATE OF DISCHARGE:                               OPERATIVE REPORT   PROCEDURE PERFORMED:  Electrophysiologic study and RF catheter ablation  of atrial flutter isthmus followed by dual-chamber pacemaker insertion.   INDICATIONS:  Symptomatic tachybrady syndrome with documented flutter  and bradycardia with heart rates in the 30s.   INTRODUCTION:  The patient is an 77 year old woman, who is admitted to  the hospital with syncopal episode and recurrent near syncope, and was  found to be in atrial flutter and had documented post termination pauses  and bradycardia when she would terminate from her flutter and go back to  sinus rhythm.  She would also have pauses in flutter as well of up to 3  seconds.  The patient is now referred for catheter ablation of atrial  flutter as well as a permanent pacemaker insertion with the anticipation  that she also be treated for atrial fibrillation.   PROCEDURE:  After informed consent was obtained, the patient was taken  to the diagnostic EP lab in a fasting state.  After usual preparation  and draping, intravenous fentanyl and midazolam was given for sedation.  A 6-French hexapolar catheter was inserted percutaneously in the right  jugular vein and advanced to the coronary sinus.  A 7-French multipolar  Halo catheter was inserted percutaneously in the right femoral vein and  advanced to the right atrium.  A 5-French quadripolar catheter was  inserted percutaneously in the right femoral vein and advanced to the  HIS bundle region.  After measurement of the basic intervals, mapping  was carried out demonstrating a smaller than usual atrial flutter  isthmus.  Pacing was carried  out from the coronary sinus after a gram of  magnesium and a milligram of ibutilide were given with return the  patient from AFib back to sinus rhythm.  Having accomplished this, the 7-  [place] quadripolar ablation catheter was maneuvered into the usual  atrial flutter isthmus.  During pacing from the coronary sinus, a total  of 14 RF energy applications were delivered.  This resulted in creation  of the isthmus block in the atrial flutter isthmus.  The patient was  watched for approximately 20 minutes and during this time, rapid  ventricular pacing was carried out from the RV apex demonstrating the VA  Wenckebach cycle length of 400 msec.  Programmed ventricular stimulation  was carried out from the RV apex at S1-S2 coupling interval of 500/400  and stepwise decreased down to 380 where retrograde AV node ERP was  observed.  During programmed ventricular stimulation, the atrial  activation sequence was midline and decremental.  Next, rapid atrial  pacing was carried out from the coronary sinus and the high right  atrium, and stepwise decreased down to 320 msec where AV Wenckebach was  observed.  During  rapid atrial pacing, there were no inducible SVT.  Finally, programmed atrial stimulation was carried out from the CS in  the right atrium at 500 msec, and the S1-S2 interval stepwise decreased  down to 270 msec where the AV node ERP was observed.  During programmed  atrial stimulation, there were no AH jumps, no echo beats, and no  inducible SVT.  At this point, additional observation was carried out,  and the patient had no recurrent atrial flutter isthmus conduction, and  the catheters were removed, and the patient was prepped for pacemaker  implantation.   After the patient was prepped and draped in the usual manner, 30 mL of  lidocaine was infiltrated in the left infraclavicular region.  A 5-cm  incision was carried out over this region.  Electrocautery was utilized  to dissect down  to the fascial plane.  The left subclavian vein was  subsequently punctured x2 and the St. Jude model 1688, 52-cm active-  fixation pacing lead, serial number [id number] was advanced into the right  ventricle and the St. Jude model 1599T, 46-cm active-fixation pacing  lead, serial number [id number] was advanced into the right atrium.  Mapping was carried out in the right ventricle and at the final site,  the R-waves measured 12 mV and the pacing impedance was 630 ohms.  A 10-  V pacing did not stimulate the diaphragm with the lead actively fixed.  There was a large injury current noted.  The pacing threshold was 0.9 V  at 0.5 msec.  With these satisfactory parameters, attention was turned  for placement of the atrial lead, which was placed in the anterolateral  portion of the right atrium.  Mapping during the sinus rhythm  demonstrated a decreased P-wave amplitude throughout the atrium.  At the  final site, the P-waves were 1.7 mV, the impedance with the lead  actively fixed 578 ohms, and the threshold was 0.8 V at 0.5 msec.  A 10-  V pacing did not stimulate the diaphragm.  With both the atrial and  ventricular leads in satisfactory position, they were secured to the  subpectoralis fascia with a figure-of-eight silk suture.  The sewing  sleeve was also secured with silk suture.  Electrocautery was utilized  to make subcutaneous pocket.  Kanamycin irrigation was utilized to  irrigate the pocket, and electrocautery was utilized to assure  hemostasis.  The St. Jude Zephyr XL DR dual-chamber pacemaker, serial  number [id number] was connected to the atrial and ventricular leads, and  placed back in the subcutaneous pocket.  Generator was secured with silk  suture.  At this point, the pocket was irrigated with kanamycin, and the  incision was closed with layer of 2-0 Vicryl followed by layer of 3-0  Vicryl.  Benzoin was painted on the skin.  Steri-Strips were applied,  and a pressure dressing was  placed, and the patient was returned to her  room in satisfactory condition.   COMPLICATIONS:  There were no immediate procedure complications.   RESULTS:  This demonstrates successful catheter ablation of atrial  flutter with a total of 14 RF energy applications to deliver the atrial  flutter isthmus followed by successful implantation of a dual-chamber  pacemaker in a patient with symptomatic tachybrady syndrome and atrial  flutter with pauses and bradycardia and sinus rhythm.      Doylene Canning. Ladona Ridgel, MD  Electronically Signed     GWT/MEDQ  D:  10/12/2007  T:  10/13/2007  Job:  [id number]  cc:   Atha Starks. Bean, FNP

## 2010-09-29 NOTE — Consult Note (Signed)
NAME:  [name], [name]             ACCOUNT NO.:  [id number]   MEDICAL RECORD NO.:  [id number]          PATIENT TYPE:  INP   LOCATION:  5153                         FACILITY:  [facility]   PHYSICIAN:  Madolyn Frieze. Jens Som, MD, FACCDATE OF BIRTH:  1923/10/26   DATE OF CONSULTATION:  01/25/2008  DATE OF DISCHARGE:                                 CONSULTATION   The patient is an 77 year old female with past medical history of  coronary artery disease, paroxysmal atrial ablation, COPD, diastolic  congestive heart failure, history of pacemaker, pituitary tumor,  diabetes, hypertension, hyperlipidemia, tobacco abuse, and now status  post shoulder hematoma who we were asked to evaluate for Coumadin use  and necessity.  The patient does have a history of coronary artery  disease.  She had an MI in 2003.  At that time, she had a bare-metal  stent to her circumflex.  Her right coronary was chronically occluded.  She also has a history of atrial fibrillation.  She has had a previous  atrial flutter ablation as well as a pacemaker placed.  Note, during the  history of day, she is not very talkative.  However, she denies any  dyspnea on exertion, orthopnea, PND, pedal edema, palpitations,  presyncope, syncope, or chest pain.  The patient was admitted on  January 19, 2008, by the Primary Care Service.  She was complaining of  severe right shoulder pain at that time.  On admission, her INR was  noted be elevated at greater than 9.8.  Further evaluation revealed  subluxation of the right humeral head.  She also was found to have  hemarthrosis.  Her Coumadin was reversed.  She also had a drop in her  hemoglobin down to 7.5.  Note, her initial hemoglobin was 11.1.  She did  require transfusion as well.  Orthopedics has evaluated the patient and  she did have arthrocentesis.  It is otherwise been managed  conservatively.  Cardiology is asked to evaluate for the risks and  benefits of Coumadin.  The patient's  present medications include Pepcid  20 mg p.o. nightly, subcutaneous insulin, Cardizem 120 mg p.o. daily,  Toprol 25 mg p.o. daily, Zocor 40 mg p.o. daily, Spiriva 18 mcg daily,  amiodarone 200 mg p.o. daily, potassium 20 mEq p.o. daily, iron sulfate  160 mg p.o. b.i.d., vitamin C, Colace, and enoxaparin 40 mg subcu daily.   SOCIAL HISTORY:  She does smoke.   ALLERGIES:  Note, she has no known drug allergies.   FAMILY HISTORY:  Positive for coronary artery disease in her mother, but  not prematurely.   PAST MEDICAL HISTORY:  Significant for diabetes mellitus, hypertension  and hyperlipidemia.  She has a history of coronary artery disease as  outlined in the HPI and she has had a prior atrial flutter ablation as  well as a pacemaker placed.  She has a history of paroxysmal atrial  fibrillation.  She also has COPD.  She has had a recurrent pituitary  tumor.  There is also peripheral vascular disease.  There is a history  of noncompliance per Dr. Vern Claude notes.  She has  had prior total hip  replacement as well as hysterectomy.  She has had a cholecystectomy and  bilateral knee replacements.   REVIEW OF SYSTEMS:  She denies any headaches, fevers, or chills.  There  is no productive cough or hemoptysis.  There is no dysphagia,  odynophagia, melena, or hematochezia.  There is no dysuria or hematuria.  There is no rash or seizure activity.  There is no orthopnea, PND, or  pedal edema.  She does ambulate with a walker.  The remaining systems  are negative.   PHYSICAL EXAMINATION:  VITAL SIGNS:  Today, shows a blood pressure of  138/60 and pulse is 84.  She has a temperature of 98.4.  She is 95% room  air.  GENERAL:  She is well developed and well nourished, in acute distress.  SKIN:  Warm and dry.  She does not appeared to be depressed, although  somewhat somnolent.  There is no peripheral clubbing.  BACK:  Normal.  HEENT:  Normal with normal eyelids.  NECK:  Supple with normal upstroke  bilaterally.  I cannot appreciate  bruits.  There is no jugular venous distention.  No thyromegaly is  noted.  CHEST:  Clear to auscultation, no expansion.  CARDIOVASCULAR:  Regular rhythm.  Normal S1 and S2.  I cannot appreciate  murmurs, rubs, or gallops.  UPPER EXTREMITIES:  Note, she does have a right shoulder hemarthrosis  and her right arm is in a sling.  ABDOMEN:  Nontender and nondistended.  Positive bowel sounds.  No  hepatosplenomegaly.  No mass appreciated.  There is no abdominal bruit.  She has 2+ femoral pulses bilaterally.  No bruits.  LOWER EXTREMITIES:  No edema.  I could palpate no cords.  She has 1+  dorsalis pedis pulses bilaterally.  Her neurologic exam is grossly  intact.   LABORATORY DATA:  From January 24, 2008, showed a white blood cell  count of 12.9 with a hemoglobin 9.3, hematocrit 27.5, and platelet count  is 334.  Her BUN and creatinine from the January 05, 2008, is 0.94  respectively.  Her most recent INR on January 23, 2008, was 1.6.  Her  electrocardiogram shows a sinus rhythm at a rate of 93.  The axis is  normal.  There is inferolateral-ST depression.   DIAGNOSES:  1. History of paroxysmal atrial fibrillation/Coumadin - Ms. Frary      remains in sinus rhythm today.  Apparently, there has been some      issues with compliance according to Dr. Vern Claude notes.  She is also      77 years old, has had a hemarthrosis and also ambulates with a      walker.  I feel the risk of Coumadin outweighs the benefit and we      would recommend discontinuing this.  I will review this with Dr.      Daleen Squibb tomorrow.  We would recommend in the absence of Coumadin to      begin aspirin 325 mg p.o. daily when it is okay from shoulder      standpoint.  She will continue on her amiodarone, Cardizem, and      Toprol.  2. Diabetes mellitus - management per the Primary Care Service.  3. Hypertension - her blood pressure adequately controlled on her      present medications.   4. Hyperlipidemia - she will continue on her statin.  5. Tobacco abuse - we discussed the importance of discontinuing this.  6. Chronic obstructive  pulmonary disease.  7. Coronary artery disease - we are discontinuing her Coumadin when      stable and treat with aspirin and she will continue on her beta-      blocker as well as her statin.  8. History of pacemaker placement.   Note, ideally Ms. Outland would be on Coumadin as she does have embolic  risk factors of age greater than 36, diabetes and hypertension, but  again we feel the risk outweighs the benefit.      Madolyn Frieze Jens Som, MD, [facility]  Electronically Signed     BSC/MEDQ  D:  01/25/2008  T:  01/26/2008  Job:  [id number]

## 2010-09-29 NOTE — Discharge Summary (Signed)
NAME:  [name], [name]             ACCOUNT NO.:  [id number]   MEDICAL RECORD NO.:  [id number]          PATIENT TYPE:  INP   LOCATION:  2034                         FACILITY:  [facility]   PHYSICIAN:  Jesse Sans. Wall, MD, FACCDATE OF BIRTH:  [birth year]   DATE OF ADMISSION:  10/07/2007  DATE OF DISCHARGE:  10/10/2007                               DISCHARGE SUMMARY   ALLERGIES:  No known drug allergies.   TIME FOR THIS DICTATION AND CORRELATION OF INFORMATION:  Greater than 45  minutes.   FINAL DIAGNOSES:  1. Admitted with presyncope.  2. New diagnosis this admission of atrial fibrillation/typical atrial      flutter (paroxysmal).      a.     Post termination pauses this admission.      b.     CHADS equal 4.  3. Scheduled pacemaker and ablation of typical atrial flutter for      Thursday, Oct 12, 2007.   SECONDARY DIAGNOSES:  1. History of coronary artery disease.      a.     NSTEMI 2003.      b.     Stent to left circumflex, 2003.      c.     Left heart catheterization, 2008.  This stent is patent and       nonobstructive coronary artery disease.  2. Diabetes.  3. Chronic obstructive pulmonary disease.  History of tobacco      habituation.  4. Aortoiliac occlusive disease status post stent in the iliac artery,      patent catheterization, 2008.  5. Pituitary tumor with recurrence seen on MRA, May 25, 2006.      a.     Followup at [facility] in 2008 with surveillance going forward.  6. Status post cholecystectomy, hysterectomy, total knee arthroplasty,      total hip arthroplasty.  7. 2D echocardiogram, January 2008.  8. Ejection fraction 75%-80% with akinesis of the inferior wall.  No      procedures this admission.  The patient was started on Coumadin.      Electrocardiogram showed typical atrial flutter.  There was another      electrocardiogram which showed atrial fibrillation.  9. Troponin-I studies were 0.07, 0.05, and 0.04 this admission.   BRIEF HISTORY:  [name] is an  77 year old female.  She has a history  of coronary artery disease.  She had a stent placed in the left  circumflex in 2003.  Her past medical history also includes diabetes,  hypertension, and dyslipidemia.   The patient presented to the emergency room on May 23 with presyncope.  While walking out of the door the patient had an episode of  lightheadedness.  She had nausea.  She had diaphoresis.  There was no  true syncope but she had presyncopal symptoms.  Also, very mild chest  discomfort and mild palpitations.  This constellation of symptoms lasted  at least 1 hour.   In the emergency department, she was noted to have a fast heart rate.  She was started on diltiazem bolus and drip with subsequent heart rate  in the  70s.  The patient was also hypertensive on admission with blood  pressure in the 180s.  The patient has had recent complaints of dyspnea  on exertion that has become progressively worse.  The patient now feels  better after her heart rate has subsided.  Telemetry shows what looks to  be atrial flutter.  Plan going forward, the patient will continue on her  beta-blocker, ACE inhibitor, and also continue on diltiazem drip.  She  will have serial cardiac enzymes to rule out myocardial infarction.  At  the present time, anticoagulation is on hold for chronic headaches and  also with history of headache on anticoagulation in the past which  required an extensive neurology workup given a history of pituitary  tumor.   HOSPITAL COURSE:  The patient presents on May 23 with an episode of  presyncope and with a recent history of dyspnea on exertion that has  become worse.  She presents to the emergency room with a new diagnosis  of atrial flutter, atrial fibrillation.  Coumadin therapy has been  withheld due to the patient's prior history of pituitary tumor which  showed recurrence in MRA, January 2008.  It would be prudent to withhold  Coumadin until we get a clear sense from  neurology whether this could be  restarted.  Apparently in the past, the patient has had headaches on  Coumadin as well.  She was seen on this admission by Dr. Lewayne Bunting in  consult for electrophysiology.  He recommended implantation of pacemaker  and flutter ablation for atrial fib/flutter with post-termination  pauses.  The patient will be discharging on May 26 without Coumadin  until we can ascertain whether the patient's risk profile with a CHADS  score of 4 overweighs the potential risk of the patient with recurrent  pituitary tumor.   DISCHARGE MEDICATIONS:  The patient goes home on,  1. Metformin 500 mg twice daily.  2. Enteric-coated aspirin 325 mg daily.  3. Benazepril 20 mg daily.  4. Nitroglycerin under the tongue as needed.  5. Metoprolol 25 mg.  She takes it twice daily but we will have her      only take 1 pill in the morning due to issues with bradycardia.  6. Zocor 40 mg daily at bedtime.  A new medication for cholesterol      control.  7. Coumadin therapy as mentioned above.  It is being considered but      withheld at this present time until further discussion can      elucidate the utility of this medication for this patient.   The patient is to come back to [facility] on Thursday, May 28  at 9:30.  She is asked to eat nothing after midnight, Wednesday, May 27.  She is not to take metoprolol in the morning of May 28 and not to take  metformin in the morning of May 28.   LABORATORY STUDIES:  This admission the troponin-I studies were 0.07,  0.05, and 0.04.  Serum electrolytes this admission, sodium 140,  potassium 3.8, chloride 102, carbonate 29, BUN is 10, creatinine 1.09,  glucose 181.  Complete blood count, hemoglobin 13.8, hematocrit 40.3, white cells 9.9,  platelets 361.  Alkaline phosphatase this admission is 81.  SGOT is 25,  SGPT is 17.  Magnesium is 1.6.  TSH is 0.779.  HgbA1c is 5.9.  T4 is  1.21.  Her BNP on admission was 633.       Maple Mirza, PA  Thomas C. Daleen Squibb, MD, [facility]  Electronically Signed    GM/MEDQ  D:  10/10/2007  T:  10/11/2007  Job:  [id number]   cc:   Willaim Sheng D. Jillyn Hidden, FNP  Loralee Pacas

## 2010-09-29 NOTE — Discharge Summary (Signed)
NAME:  [name], [name]             ACCOUNT NO.:  [id number]   MEDICAL RECORD NO.:  [id number]          PATIENT TYPE:  INP   LOCATION:  2037                         FACILITY:  [name]   PHYSICIAN:  Raenette Rover. Felicity Coyer, MDDATE OF BIRTH:  07-19-1923   DATE OF ADMISSION:  12/18/2007  DATE OF DISCHARGE:  12/25/2007                               DISCHARGE SUMMARY   PRIMARY CARE PHYSICIAN:  Arta Silence, MD   CARDIOLOGIST:  Jesse Sans. Wall, MD, [facility]   DISCHARGE DIAGNOSES:  1. Acute hypoxic respiratory failure, multifactorial, see below.  2. Acute on chronic diastolic heart failure.  3. Right lower lobe pneumonia.  4. Acute chronic obstructive pulmonary disease exacerbation.  5. Atrial fibrillation with rapid ventricular response, on chronic      anticoagulation.  6. Hypertension.  7. Abnormal CT this admit.   HISTORY OF PRESENT ILLNESS:  [name] is a very pleasant 77 year old  African American female with past medical history of AFib on chronic  anticoagulation, CAD status post circ stent in 2003, COPD, and  peripheral artery disease, who [name] on  day of admission with reports of lung is hurting.  The patient  reported weakness and chills x3 days with cough productive of white  sputum and shortness of breath.  The patient also reported bilateral  lower abdominal pain at the time of admission with generalized weakness.  Of note upon EMS' arrival to the patient's home, the patient required  BiPAP upon transport to emergency room for oxygen saturation of 70%.  The patient was off BiPAP upon admission evaluation in the emergency  room with O2 sats stable on 90%.  Evaluation in the ER revealed right  lower lobe consolidation consistent with pneumonia.  The EKG revealed  AFib with RVR at a rate of 168.  The patient's BNP at time of admission  2484.  The patient was admitted for further evaluation and treatment.   CONSULTATIONS DURING THIS  HOSPITALIZATION:  [name].   PAST MEDICAL HISTORY:  1. AFib, a flutter status post ablation and pacemaker insertion, Oct 12, 2007.  2. CAD status post left circ stent 2003.  3. Type 2 diabetes.  4. COPD.  5. Peripheral artery disease status post iliac stent.  6. Pituitary tumor followed at [name].  7. Status post cholecystectomy.  8. Hysterectomy.  9. Total hip replacement, total knee replacement.  10.Dyslipidemia.   COURSE OF HOSPITALIZATION:  1. Acute hypoxic respiratory failure, currently resolved at this time.      The patient's respiratory failure multifactorial in setting of      right lower lobe pneumonia, acute COPD exacerbation, as well as      acute on chronic diastolic heart failure with questionable non-      STEMI as well as AFib with RVR exacerbating the heart failure.      Despite appearing intravascularly dry upon admit, the patient did      require mild IV diuresis.  The patient will also be discharged home      on low-dose diuretic therapy.  In  regards to the patient's right      lower lobe pneumonia, the patient to be treated total of 10 days on      antibiotic therapy.  In regards to the patient's AFib with RVR, the      patient's medications have been significantly changed during this      hospitalization as listed below.  The patient is aware of these      changes and instructed to view medication list very carefully at      time of discharge.  In regards to the patient's COPD exacerbation      likely exacerbated by volume overload.  The patient did require a      short dose of IV Solu-Medrol and transitioned to p.o. prednisone      during this hospitalization.  However, COPD remained stable at this      time.  Therefore, we will discontinue prednisone.  As mentioned      above, the patient with multiple new medications.  At time of      discharge, please review medication list carefully.  2. Bilateral adrenal masses on CT.   Recommend outpatient followup per      primary care physician.  3. Right perihilar and hilar adenopathy per CT.  Hopefully, these      findings are reactive in setting of right lower lobe pneumonia.      However, we would recommend outpatient followup per primary care      physician with resolution of acute infection.   MEDICATIONS AT TIME OF DISCHARGE:  1. Aspirin 325 mg p.o. daily.  2. Metoprolol 25 mg p.o. b.i.d.  3. Zocor 40 mg p.o. nightly.  4. Cardizem CD 120 mg p.o. daily.  Note, this is a new medication.  5. Metformin 500 mg p.o. b.i.d.  6. Ceftin 250 mg 1 tab p.o. b.i.d. until gone.  7. Azithromycin 500 mg p.o. daily until gone.  8. Amiodarone 400 mg p.o. b.i.d.  Note, this is a new medication.  9. Lasix 40 mg p.o. daily.  Note, this is a new medication.  10.Potassium chloride 20 mEq p.o. daily.  Note, this is a new      medication.  11.Altace 10 mg p.o. daily.  Note, this is a new medication.  12.Spiriva 18 mcg inhale daily.  13.Coumadin 5 mg p.o. daily unless otherwise instructed.  14.The patient instructed to discontinue flecainide.   DISPOSITION:  The patient felt medically stable for discharge home at  this time where she lives with her son and has very close supervision by  multiple family members.  The patient instructed to follow up with her  primary care physician. Dr. Laurita Quint, Wednesday, January 03, 2008,  at 10:30 a.m.  The patient also will be discharged  home with continued [facility] and occupational therapy as  well as Home Health RN.  At the time of dictation, [facility]  evaluation to schedule followup appointment.   Greater than 30 minutes spent on discharge planning.      Cordelia Pen, NP      Raenette Rover. Felicity Coyer, MD  Electronically Signed    LE/MEDQ  D:  12/25/2007  T:  12/25/2007  Job:  [id number]   cc:   Arta Silence, MD  Jesse Sans. Wall, MD, [facility]

## 2010-09-29 NOTE — Assessment & Plan Note (Signed)
[facility] OFFICE NOTE   NAME:WILSONItzy, Adler                    MRN:          [id number]  DATE:01/13/2007                            DOB:          1923-07-05    Ms. Krone comes in today because of dizziness.  We recently performed  carotid Dopplers on her as a way of followup and found probably a tight  lesion in the tight innominate and a vertebral still on the right side.   She had coronary artery disease well-outlined in the chart.  Please  refer to my note of December 14, 2006.  She is having no angina.  She also  has hypertension, hyperlipidemia, and type 2 diabetes.  She had a  recurrent pituitary tumor that now apparently has been successfully  treated at [facility].   On her last visit, she was not taking any of her Toprol, Norvasc, or  Lipitor.  We refilled these.  Her blood pressures have been better per  her daughter.   She says when she uses her right hand, it gets numb, and she is right-  handed.  She notices she gets dizzy at the same time.  It is fairly  classical vertebral steal syndrome.   She seems to be more compliant today on questioning.   MEDICINES:  1. Benazepril 40 mg daily.  2. Metformin 500 mg p.o. b.i.d.  3. Enteric-coated aspirin 325 daily.  4. Metoprolol 50 mg p.o. daily.  5. Amlodipine 10 mg daily.  6. Simvastatin 40 mg daily.   Her lipids were checked in the hospital in January and were not quite at  goal.  It was not clear if she were taking her medications.  We sent her  home on Lipitor, but she has now switched to simvastatin.  I do not have  any lab data from Dr. Lucretia Roers' office.   Her blood pressure today is 136/60 in the left arm.  In the right arm,  she had a systolic pressure I heard around 75-80 on several checks with  a large cuff.  Her pulse is 68 and regular.  It was difficult to hear  her right radial pulse.  Her weight is 169 which is stable.  HEENT:  Normocephalic, atraumatic, PERRLA.  Extraocular movements  intact.  Scleral clear.  Carotid upstrokes are down on the right side.  She has a loud systolic  bruit that may even have a 2/4 characteristic at the right subclavian  area.  It radiates down into her chest.  The left side has a soft bruit  as well.  HEART:  Nondisplaced PMI.  She has a normal S1, S2 without any gallop.  I do not think she has a systolic murmur.  I think it is all from her  neck.  LUNGS:  Clear.  ABDOMEN:  Soft, good bowel sounds.  No midline bruit.  EXTREMITIES:  No cyanosis, clubbing, or edema.  Pulses are present in  her feet.  There is no sign of DVT.  SKIN:  Unremarkable.  NEUROLOGIC:  Grossly intact.   ASSESSMENT:  1.  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      She may have a high-grade lesion in her right innominate vessel.      It it hard to know if it involves the takeoff of the carotid or      subclavian or both.  2. I have advised her and her daughter about this condition.  I have      asked her not to over use her right hand and be very careful if she      gets dizzy for fear of falling.  I have discussed this case with      Dr. Tonny Bollman of our PV division at [facility].  I have arranged      for her to have an appointment with him next week.  He probably      will do a CT angio, and then discuss the case with vascular surgery      as he and I outlined today on the phone.  3. She needs lipid followup in the near future.  LDL goal is 60-70.      It is not clear if she has had any values checked recently.  Her      blood pressure is certainly a lot better now that she is more      compliant.     Thomas C. Daleen Squibb, MD, [facility]  Electronically Signed    TCW/MedQ  DD: 01/13/2007  DT: 01/14/2007  Job #: [id number]   cc:   Veverly Fells. Excell Seltzer, MD  Idamae Schuller A. Milinda Antis, MD

## 2010-09-29 NOTE — Discharge Summary (Signed)
NAME:  [name], LASURE             ACCOUNT NO.:  [id number]   MEDICAL RECORD NO.:  [id number]          PATIENT TYPE:  INP   LOCATION:  5153                         FACILITY:  [facility]   PHYSICIAN:  Valerie A. Felicity Coyer, MDDATE OF BIRTH:  [birth year]   DATE OF ADMISSION:  01/19/2008  DATE OF DISCHARGE:                               DISCHARGE SUMMARY   PRIMARY CARE PHYSICIAN:  Dr. Laurita Quint.   EXPECTED DATE OF DISCHARGE:  January 31, 2008.   DISCHARGE DIAGNOSES:  1. Right shoulder pain in setting of degenerative joint disease with      no hematoma.  2. Acute blood loss anemia secondary to number 1 and with supra-      therapeutic INR.  3. History of atrial fibrillation and atrial flutter status post      ablation in May 2009 on chronic anticoagulation prior to this      admission.   HISTORY OF PRESENT ILLNESS:  [name] is an 77 year old African  American female with past medical history of hypertension, atrial  fibrillation and atrial flutter on chronic anticoagulation prior to this  admission who presented to or [facility] on day of admit  with reports of severe right shoulder pain x2 days occurring rather  suddenly.  Difficulty obtaining history at time of admission as the  patient quite a poor historian.  However, as per patient's daughter, the  patient unable to move right shoulder secondary to pain but denies any  history of recent trauma or fall.  The patient seen at her primary care  physician's office on day of admission at which time INR was noted to be  supra-therapeutic.  The patient was transferred to the emergency room at  that time for further evaluation and treatment.   PAST MEDICAL HISTORY:  1. Hypertension.  2. Hyperlipidemia.  3. History of CAD, status post circ intervention in 2003.  4. COPD.  5. Status post hysterectomy.  6. Status post total knee and total hip surgeries.  7. Current tobacco abuse.  8. Peripheral vascular disease  status post iliac stent.  9. History of chronic headaches.  10.History of pituitary tumor.  11.History of med noncompliance in the past.  12.History of atrial fibrillation and atrial flutter status post      ablation in May 2009 on chronic anticoagulation prior to this      admission.  Discontinued during this hospitalization.   CONSULTATIONS DURING THIS ADMISSION:  1. [facility].  2. Dr. Amanda Pea, orthopedics.   COURSE OF HOSPITALIZATION:  1. Right shoulder pain with acute onset.  2. Again, daughter denied any history of trauma or fall prior to      admission.  However, the patient unsure of any recent fall.  Dr.      Amanda Pea, who was on call for orthopedics at time of admission, was      asked to see the patient in consultation.  CT of the right shoulder      was performed with no acute fracture.  However, this did reveal      chronic degenerative arthritic changes with an effusion  versus      hematoma per ortho evaluation.  Dr. Amanda Pea aspirated the patient's      shoulder returning 20 mL of thick hematoma without any signs of      infection on January 23, 2008.  Cultures of this fluid were      negative for any infection.  Hematoma likely secondary to supra-      therapeutic INR.  Please see below for further treatment of      anticoagulation.  At this time, Dr. Amanda Pea and has recommended      continued heat therapy, as needed massage, and to mobilize shoulder      as tolerated.  The patient will continue to have pain and problems      due to her severe arthritis.  Unfortunately, the patient is not a      surgical candidate for replacement at this time.  Orthopedics also      recommend steroid lidocaine pain injection once acute symptoms have      resolved.  3. History of atrial fib and atrial flutter with supra-therapeutic INR      at time of admission.  As mentioned above the patient with INR      greater than 9.8 at time of admission likely resulting in right       shoulder hematoma plus or minus trauma to the area.  The patient's      INR was reversed with 2 units of fresh frozen plasma in addition to      vitamin K.  Cardiology was asked to see the patient in consultation      in regards to discontinuing Coumadin indefinitely in setting of      advanced age and chronic debilitation.  Also with history of non      adherence to medication regimen.  Dr. Daleen Squibb evaluated the patient      during this hospitalization and felt stopping the patient's      Coumadin was in her best interest as he had consider doing this in      the past secondary to noncompliance.  Hopefully, patient's      amiodarone will maintain her normal sinus rhythm.  The patient to      be discharged home on full-dose aspirin per cardiology      recommendations.  4. Acute blood loss anemia in setting of supra-therapeutic INR.  The      patient with slow drift down downwards of hemoglobin throughout      hospitalization.  The patient did receive 1 unit of packed red      blood cells during this admission with hemoglobin going from 7.5 to      9.3.  A CT of the abdomen and pelvis was obtained to rule out any      intra-abdominal source of bleeding due to increased INR.  Results      of this test were negative.  The patient's acute blood loss thought      secondary to hematoma and right shoulder as discussed above.  At      the time of discharge the patient with no acute blood loss on exam      with hemoglobin remaining stable, greater than 48 hours at time of      dictation.  5. Chronic deconditioning.  The patient with very little interest in      participating with physical and occupational therapy during this      hospitalization.  The patient  is agreeable to skilled nursing      facility at time of discharge in hopes of rehabilitating and      perhaps returning home.  Both the patient and family were agreeable      to skilled nursing facility at time of discharge.   MEDICATIONS AT  TIME OF DISCHARGE:  1. Aspirin 325 mg p.o. daily.  2. Diltiazem 120 mg p.o. daily.  3. Toprol XL 25 mg p.o. daily.  4. Simvastatin 40 mg p.o. nightly.  5. Spiriva 18 mcg inhaled daily.  6. Amiodarone 200 mg p.o. daily.  7. Potassium chloride 20 mEq p.o. daily.  8. Ferrous sulfate 160 mg p.o. b.i.d.  9. Vitamin C 1000 mg p.o. b.i.d.  10.Voltaren gel applied to right shoulder b.i.d.  11.Colace 100 mg p.o. b.i.d.  12.Tylenol 600 mg p.o. q.4 hours p.r.n. pain or fever.  13.Ultram 50 mg tab 1 to 2 tablets p.o. q.12 hours p.r.n. pain.  14.Hydrocodone - APAP 5/325 one tablet p.o. q.4 hours p.r.n.      breakthrough pain.  15.The patient instructed to discontinue Coumadin.  16.Metformin 500 mg p.o. b.i.d.   PHYSICAL EXAM AT TIME OF DISCHARGE:  VITAL SIGNS:  Blood pressure  153/66, heart rate 67, respirations 18, temperature 97.4, O2 sat is 100%  on room air.  GENERAL:  This is an elderly [place] American female awake and alert  with flat affect and in no acute distress.  HEAD:  Normocephalic, atraumatic.  EYES:  The pupils are equal, round, reactive to light with no scleral  icterus or injection.  EAR, NOSE, AND THROAT:  Mucous membranes are moist.  The patient with  normal hearing.  NECK:  Supple.  No thyromegaly or lymphadenopathy.  CHEST:  WITH symmetrical movement, nontender to palpation.  CARDIOVASCULAR:  S1, S2 regular rate and rhythm.  No murmur.  RESPIRATORY:  Patient with decreased inspiratory effort.  Lung sounds  are clear to auscultation bilaterally.  No wheezes, rales or crackles.  No increased work of breathing.  GI:  Abdomen is soft, nontender, nondistended with positive bowel  sounds.  No appreciated masses or hepatosplenomegaly.  EXTREMITIES:  The patient with limited range of motion of right upper  extremity with no obvious deformity no clubbing, cyanosis or edema.  NEUROLOGICALLY:  Patient moves all extremities x4 with no focal motor  sensory deficits on exam.   PSYCHOLOGICALLY:  The patient is alert and oriented to name and place  with very flat affect, seemingly chronic for this patient.   PERTINENT LAB WORK AT TIME OF DISCHARGE:  Hemoglobin 9.8, hematocrit  29.9.   DISPOSITION:  The patient felt medically stable for discharge to skilled  nursing facility at this time.  Upon dictation, awaiting family to  choose nursing facility as offers have been given by clinical social  worker.  Suspect at this time the patient will need long-term skilled  nursing facility stay unless family willing or able to provide 24-hour  supervision.  The patient can follow up with her primary care physician  Dr. Laurita Quint as needed if discharge from skilled nursing  facility.      Cordelia Pen, NP      Raenette Rover. Felicity Coyer, MD  Electronically Signed    LE/MEDQ  D:  01/30/2008  T:  01/30/2008  Job:  [id number]   cc:   Arta Silence, MD  Jesse Sans. Wall, MD, [facility]

## 2010-09-29 NOTE — Letter (Signed)
January 25, 2007    Jesse Sans. Wall, MD, FACC  [address]  [address]  [place], [place] [address]   RE:  KAMAREE, BERKEL  MRN:  [id number]  /  DOB:  August 30, 1923   Dear Elijah Birk,   It was may pleasure to see Geni Bers for evaluation of her  peripheral arterial disease and subclavian steel syndrome.   As you know, Ms. Critz is a delightful 77 year old woman who presents  with the chief complaint of dizziness.  She has some difficulty  characterizing her symptoms, but clearly she complains of dizziness both  with sitting and standing that occurs on a daily basis.  Her symptoms  have increased over the last few months, and that is her major complaint  at present.  She has not had syncope, but she has had episodes of  transient blurry vision associated with her dizziness.  Secondarily, she  complains of right arm pain with certain movements, and with use of the  right arm.  This symptom has also been present for at least 2 months.  She has a difficult time determining whether her dizziness is worse with  activities involving the use of the right arm.  She has not had frank  syncope.  She denies chest pain.  She has stable exertional dyspnea.  Ms. Hottel ambulates with the use of a cane, and has been using a cane  for the past few years.  She denies leg claudication.  She has no  history of stroke or TIA.   As you know, her evaluation has included a vascular ultrasound study,  that demonstrated elevated velocities in the right subclavian artery.  She also had a marked pulse differential between the right and left arm.  There was to and fro flow demonstrated in the right vertebral artery.  Her carotid velocities were much lower than on previous exams as well.   PAST MEDICAL HISTORY:  Pertinent for:  1. Coronary artery disease.  2. Hypertension.  3. Dyslipidemia.  4. Type 2 diabetes.  5. Pituitary tumor, with previous surgical resection and follow-up      radiation treatment.  6.  Bilateral total knee arthroplasty.  7. Bilateral total hip arthroplasty.   MEDICATIONS:  1. Benazapril 40 mg daily.  2. Metformin 500 mg twice daily.  3. Aspirin 325 mg daily.  4. Metoprolol ER 50 mg daily.  5. Amlodopine 10 mg daily.  6. Simvastatin 40 mg daily.   ALLERGIES:  NO KNOWN DRUG ALLERGIES.   SOCIAL HISTORY:  The patient lives with her son.  She is a retired  Software engineer.  She is a long time smoker and continues to smoke 1-2  cigarettes daily.  She does not use alcohol.   FAMILY HISTORY:  Pertinent for hypertension and coronary artery disease  in her siblings.  There is no history of stroke in the family.   PHYSICAL EXAMINATION:  The patient is alert and oriented.  She is in no  acute distress.  Her weight is 170 pounds.  Blood pressure 94/64 in the  right arm, 154/70 in the left arm.  Heart rate 60.  Respiratory rate 16.  HEENT:  Normal.  NECK:  Normal carotid upstrokes, with bilateral bruits.  Jugular venous  pressure is normal.  There is no thyromegaly or thyroid nodules.  LUNGS:  Clear to auscultation bilaterally.  CARDIOVASCULAR:  The heart is regular rate and rhythm, with a 2/6  systolic ejection murmur along the left sternal border.  There are no  diastolic murmurs or gallops.  There is a loud systolic bruit over the  right upper chest and neck in the right subclavian area.  ABDOMEN:  Soft and nontender; no organomegaly and no abdominal bruits.  BACK:  There is no CVA tenderness.  EXTREMITIES:  No clubbing, cyanosis or edema.  Pedal pulses are  diminished but intact and equal.  SKIN:  Warm and dry without rash.  NEUROLOGIC:  Cranial nerves II-XII are intact.  Strength intact and  equal.  On the upper extremity exam the right radial pulse is not  palpable.  The right brachial pulse is 1+.  The left brachial and radial  pulses are both 2+.   ASSESSMENT:  Ms. Delgrande is an 77 year old woman with physical  examination and vascular ultrasound evidence of  significant obstructive  arterial disease involving the right upper extremity.  The exact  location of the lesion is unclear, but it likely involves either the  innominate artery or right subclavian artery.  Her dizziness may be  secondary to subclavian steel.  Certainly, her right arm claudication  symptoms are due to arterial insufficiency; with to and fro flow in the  right vertebral as further evidence of a hemodynamically significant  subclavian stenosis.  I think in order to better define her anatomy, we  should proceed with a CT angiogram of the aortic arch and great vessels,  with focus on the innominate and right subclavian artery.  Depending on  the lesion location, she may have disease that is amenable to  endovascular treatment, or may require surgical repair.  I think her  symptoms clearly warrant treatment.   From a medical standpoint, she is under appropriate medical therapy for  her peripheral arterial and coronary disease.  She was counseled  regarding tobacco cessation.  I will be in contact with her after the  results of her CT scan are complete, to determine an appropriate follow-  up from there.   Tom, thanks again for asking me to evaluate Ms. Yeatman.  Please feel  free to call at any time with questions regarding her care.  I will be  in touch after her CT angiogram is complete.    Sincerely,      Veverly Fells. Excell Seltzer, MD  Electronically Signed    MDC/MedQ  DD: 01/25/2007  DT: 01/25/2007  Job #: [id number]

## 2010-09-29 NOTE — Assessment & Plan Note (Signed)
[facility] OFFICE NOTE   [name], [name]                    MRN:          [id number]  DATE:05/19/2007                            DOB:          07-05-1923    REASON FOR VISIT:  [name] returns today.   PROBLEM LIST:  1. Coronary artery disease.  She is having no angina.  2. Hypertension.  3. Hyperlipidemia.  4. Type 2 diabetes.  5. Severe peripheral vascular disease and cerebrovascular disease.      Please see Dr. Earmon Phoenix note from February 13, 2007.  She has      marked discrepancy of blood pressures in the right and left arm      (right less than left with a typical right subclavian steal      syndrome.)  She says she gets lightheaded when she stirs something      at the stove too quick.   CURRENT MEDICATIONS:  1. Benazepril 40 mg a day.  2. Metformin 500 mg p.o. b.i.d.  3. Enteric-coated aspirin 325 mg a day.  4. Metoprolol succinate 50 mg daily.  5. I had given her amlodipine and simvastatin on her last visit, but      she has not had those filled since July according to the drug      store.   PHYSICAL EXAMINATION:  GENERAL:  She looks remarkably good today.  She  looks much younger than stated age.  Skin is immaculate.  She wears  glasses.  VITAL SIGNS:  Her blood pressure is 110/70 in the right arm, 160/70 in  the left arm, pulse is 60 and regular.  Weight is 170 which is stable.  HEENT:  Stable otherwise.  Carotid upstrokes were equal bilaterally with  loud bruit on the right as well as the right subclavian area.  Pulses  are intact, but slightly reduced in the right arm.  There is no JVD.  LUNGS:  Clear.  HEART:  Reveals a nondisplaced PMI.  There is no S4.  She has a regular  rate and rhythm.  ABDOMEN:  Soft.  EXTREMITIES:  Reveal no significant edema.  Pulses were present in the  lower extremities.   ASSESSMENT/PLAN:  [name] is doing well with medical therapy.  We  have  talked about avoiding repetitive or overexertion with the right arm  to avoid dizziness or syncope.  She is right-handed, however.   I have made no other changes in her program.  We will see her back in 6  months.     Thomas C. Daleen Squibb, MD, [facility]  Electronically Signed    TCW/MedQ  DD: 05/19/2007  DT: 05/19/2007  Job #: [id number]   cc:   Billie D. Bean, FNP

## 2010-09-29 NOTE — H&P (Signed)
NAME:  [name], [name]             ACCOUNT NO.:  [id number]   MEDICAL RECORD NO.:  [id number]          PATIENT TYPE:  INP   LOCATION:  2034                         FACILITY:  [facility]   PHYSICIAN:  Rod Holler, MD     DATE OF BIRTH:  [birth year]   DATE OF ADMISSION:  10/07/2007  DATE OF DISCHARGE:                              HISTORY & PHYSICAL   CHIEF COMPLAINT:  Lightheadedness.   HISTORY:  Ms. Mayberry is an 77 year old female with a history of coronary  disease status post stent placement  in 2003, diabetes mellitus,  hypertension, and hyperlipidemia who presented to the emergency  department with presyncope.  Today while walking out to the door, the  patient had an episode of lightheadedness, nausea, diaphoresis, no true  syncope but presyncopal symptoms, very mild chest discomfort, and mild  palpitations. This lasted for at least an hour.  In the emergency  department, she was noted to be tachycardic and was given 10 mg  diltiazem IV and started on a diltiazem drip with subsequent drop in her  heart rate to the 70s.  Of note, on admission the patient was also  hypertensive with systolic blood pressure to 180s, currently in the low  100s.  Also, the patient has had recent complaints of dyspnea on  exertion that has progressively worsened.  Currently, the patient is  feeling better than when she presented to the emergency department.   PAST MEDICAL HISTORY:  1. Coronary artery disease status post non-ST elevation MI, status      post stent placement to the left circumflex in 2003, status post      cardiac catheterization in 2008 with nonobstructive coronary      disease, and a patent stent in the left circumflex.  2. Diabetes mellitus.  3. Hypertension.  4. Chronic obstructive pulmonary disease.  5. Osteoarthritis.  6. Hyperlipidemia.  7. Ongoing tobacco use.  8. Peripheral vascular disease status post iliac stent.  9. Compliance issues.  10.Chronic headache.  11.History of  pituitary tumor.   MEDICATIONS:  1. Benazepril 40 mg p.o. daily.  2. Metformin 500 mg p.o. b.i.d.  3. Aspirin 325 mg p.o. daily.  4. Toprol-XL 50 mg p.o. daily.  5. Sublingual nitroglycerin p.r.n.   ALLERGIES:  No known drug allergies.   SOCIAL HISTORY:  The patient smokes one-half to 1 pack per day along  with smokeless tobacco.   FAMILY HISTORY:  Mother with a history of coronary artery disease, not  premature.   REVIEW OF SYSTEMS:  All systems reviewed in detail are negative except  as noted in history of present illness.   PHYSICAL EXAMINATION:  Vital Signs: Temperature 97.4, heart rate  initially 114, currently in the 70s, respiratory rate 23, oxygen  saturation 97%, blood pressure initially 184/107, currently 97/70.  GENERAL:  Well-developed, well-nourished female, alert, oriented x3, and  in no apparent distress.  HEENT: Atraumatic and normocephalic.  Pupils equal, round, and reactive  to light.  Extraocular movements intact.  NECK:  Supple.  No adenopathy.  No JVD.  No carotid bruits.  CHEST:  Faint bibasilar crackles with  diffuse end-expiratory wheezing,  equal bilateral breath sounds.  CORONARY:  Regular rhythm, normal rate, normal S1, S2, 2/6 systolic  ejection murmur.  ABDOMEN:  Soft, nontender, and nondistended.  EXTREMITIES:  1+ lower extremity edema, without clubbing or cyanosis.  NEUROLOGIC:  No focal deficits.   EKG shows an atrial tachycardia, probable atrial flutter, nonspecific ST  changes.  Chest x-ray shows hyperinflation.   LABORATORY DATA:  Sodium 141, potassium 4.4, chloride 105, bicarb 30,  BUN 12, creatinine 1.1, and glucose 96.  White blood cell count 10.2,  hematocrit 42, and platelet count 354.  BNP 633, CK-MB 1.3, troponin  less than 0.05, and myoglobin 107.   IMPRESSION:  An 77 year old female who presents with a probable atrial  flutter, and presyncopal symptoms.   PLAN:  1. Cardiovascular.  Admitted the patient to a telemetry bed,  aspirin      daily, Zocor daily, continue beta blocker, continue ACE inhibitor,      continue diltiazem drip that was started in the emergency      department, 1 dose of Lasix IV, and rule out serial cardiac      enzymes.  We will hold on anticoagulation for now given her chronic      headaches, along with her history of a headache on anticoagulation      in the past that required an extensive neurologic workup given a      history of a pituitary tumor.  Also, the patient has a history of      noncompliance.  Daily EKG.  2. Endocrine.  Metformin at home dose, sliding-scale insulin, and      thyroid function tests.  3. Fluids, electrolytes, nutrition.  Diabetic diet, BMP, and magnesium      in the morning, and p.o. after midnight.  4. Prophylaxis.  Tobacco cessation consult, fall risk, DVT      prophylaxis.      Rod Holler, MD  Electronically Signed     TRK/MEDQ  D:  10/07/2007  T:  10/08/2007  Job:  [id number]

## 2010-09-29 NOTE — Assessment & Plan Note (Signed)
[facility] OFFICE NOTE   NAME:WILSONNasiyah, [name]                    MRN:          [id number]  DATE:03/12/2008                            DOB:          [birth year]    [name] returns today for followup.   PROBLEM LIST:  1. Paroxysmal atrial fibrillation, well controlled on amiodarone 200      mg a day.  2. History of atrial flutter ablation.  3. Symptomatic bradycardia.  4. History of chronic diastolic heart failure.  5. History of anemia.  6. History of noncompliance.  7. Tobacco use.   She looks great today.  She is not having any complaints.   MEDICATIONS:  1. Enteric-coated aspirin 325 mg per day.  2. Simvastatin 40 mg a day.  3. Potassium 20 mEq a day.  4. Diltiazem 120 mg a day.  5. Amiodarone 200 mg a day.  6. Iron sulfate 325 daily.  7. Metformin 500 mg p.o. b.i.d.  8. Metoprolol succinate 25 mg a day.  9. Voltaren gel b.i.d.   PHYSICAL EXAMINATION:  VITAL SIGNS:  Her blood pressure today is 102/60,  her pulse is 60 and regular, weight is 165 and stable.  GENERAL:  She looks really good today.  She is alert and oriented x3.  She seems to have more energy than usual.  NECK:  No JVD.  LUNGS:  Clear to auscultation and percussion.  No rhonchi or rales.  HEART:  Regular rate and rhythm.  No gallop.  ABDOMEN:  Soft, good bowel sounds.  No obvious organomegaly.  EXTREMITIES:  No edema today.  Pulses were present but reduced.  NEURO:  Intact.   [name] is doing well.  We will obtain a TSH, free T4 and LFTs today  on amiodarone.  I will plan on seeing her back again in 3 months.     Thomas C. Daleen Squibb, MD, [facility]  Electronically Signed    TCW/MedQ  DD: 03/12/2008  DT: 03/12/2008  Job #: [id number]

## 2010-09-29 NOTE — Assessment & Plan Note (Signed)
[facility] OFFICE NOTE   NAME:WILSONGenevie, Elman                    MRN:          [id number]  DATE:12/14/2006                            DOB:          16-Jun-1923    Ms. Ingle comes in today for further management and followup of the  following issues:  1. Coronary artery disease. Catheterization in January 2008 showed a      patent circumflex stent, nonobstructive disease in her left system,      high grade diffuse disease and a small right coronary artery. She      had good left ventricular function. She has been treated medically.      Unfortunately she is not taking her Toprol, her Norvasc or her      Lipitor.  2. Cerebrovascular disease. She has bilateral carotid bruits. Her last      Doppler August 12, 2005 showed stable 60-79% right internal carotid      artery stenosis and mildly increased left internal carotid artery      stenosis of 40-59%. She had antegrade flow in both vertebrals. She      is asymptomatic.  3. Hypertension.  4. Hyperlipidemia.  5. Type 2 diabetes.   Unfortunately she is very noncompliant. She is smoking which her  daughter says she hides. How much she is smoking is hard to know. She  smokes about a pack every 3 days she says.   She has not had her Toprol, Norvasc or Lipitor filled for several  months. She says she lost one of her medicines.   Her medications that she is taking:  1. Metformin 1500 mg p.o. b.i.d.  2. Benazepril 40 mg a day.  3. Enteric coated aspirin 325 a day.  4. Eye drops daily.   She has no orthopnea or PND, she is not having any angina. She has a  chronic cough.   PHYSICAL EXAMINATION:  Her blood pressure today is 152/82, her pulse is  81 and regular. She has frequent PACs. EKG confirms normal sinus with  PACs. There is no acute changes. Her weight is 167.  HEENT:  Normocephalic, atraumatic. PERRLA. Extraocular movements intact.  She looks younger than  her stated age. Skin is absolutely beautiful.  Facial symmetry is normal.  NECK:  Supple. Carotid reveal bilateral bruits with good upstrokes.  Thyroid is not enlarged, trachea is midline.  LUNGS:  Reveal respiratory rhonchi on the right side that does not  clearly clear with cough.  HEART:  Reveals a nondisplaced PMI. She has a normal S1, S2 without  gallop.  ABDOMEN:  Soft with good bowel sounds. She is obese. Organomegaly is  difficult to assess.  EXTREMITIES:  Reveal no edema. Pulses are present.  NEUROLOGIC:  Grossly intact.  SKIN:  Immaculate.   ASSESSMENT/PLAN:  Ms. Chalker unfortunately is very noncompliant. Her  risk is substantially increased of having a future stroke or heart  attack based on the fact that she is not taking her medications and  continues to smoke. I have reinforced this today with her and her  daughter. She says  that she will fill her medicines if I write them.   PLAN:  1. Renew Toprol as metoprolol succinate 50 mg a day.  2. Renew Norvasc 10 mg a day to amlodipine 10 mg a day.  3. Change Lipitor 40 to simvastatin 40 to save her some money.  4. Continue other medications.  5. Carotid Dopplers.  6. Followup with me in 3 months.     Thomas C. Daleen Squibb, MD, [facility]  Electronically Signed    TCW/MedQ  DD: 12/14/2006  DT: 12/15/2006  Job #: [id number]   cc:   Evie Lacks, MD  Audrie Gallus Milinda Antis, MD

## 2010-09-29 NOTE — Consult Note (Signed)
NAME:  [name], [name]             ACCOUNT NO.:  [id number]   MEDICAL RECORD NO.:  [id number]          PATIENT TYPE:  INP   LOCATION:  1830                         FACILITY:  [facility]   PHYSICIAN:  Luis Abed, MD, FACCDATE OF BIRTH:  1924-01-06   DATE OF CONSULTATION:  12/18/2007  DATE OF DISCHARGE:                                 CONSULTATION   Ms. Tryon is currently being admitted by primary care with atrial fib  and consolidation in the right lower lobe of her lung.  The patient is a  poor historian.  She says that she has had shortness of breath.  She  says she has increased shortness of breath with exercise.  Exact  etiology is not clear.  She has had a cough with white sputum  production.  She comes to the emergency room today with atrial  fibrillation.  The rate is elevated.  Her first temperature is not  abnormal although she feels warm.  Her white count also is not elevated  although there is a slight left shift.  Chest x-ray shows definite right  lower lobe consolidation and no definite congestive heart failure.   Historically, the patient has coronary disease with a STEMI in 2003 and  a circumflex stent.  Cath in 2008 revealed an ejection fraction of 65%.  She had mild disease in the left main and LAD.  Her stent was patent.  She had high-grade right coronary lesions with left-to-right collaterals  that had not changed since 2003.  The patient has undergone a flutter  ablation in May 2009.  She receives flecainide for atrial fib and most  recently has been in sinus rhythm before this admission.  She has  bradycardia-tachycardia syndrome with a DDDR pacemaker historically.   PAST MEDICAL HISTORY:  Allergies:  No known drug allergies.   MEDICATIONS:  1. Aspirin 81.  2. Benazepril 40.  3. Hydrochlorothiazide.  4. Metformin 500 b.i.d.  5. Flecainide 100 b.i.d.  6. Lopressor 25 b.i.d.  7. Simvastatin 40.  8. Spiriva.  9. Coumadin.   OTHER MEDICAL PROBLEMS:  See the  list below.   SOCIAL HISTORY:  The patient lives in [place] with her son.  She is  a retired Lawyer.  She has smoked for 50 years.   FAMILY HISTORY:  The family history is noncontributory at this time.   REVIEW OF SYSTEMS:  The patient has felt hot.  She has had a headache.  She has had shortness of breath as outlined above.  She has had general  malaise for approximately 2 days.  She has had some arthralgias.  She  has had some mild abdominal pain.  Otherwise, her review of systems is  negative.   PHYSICAL EXAM:  Her first temperature of was 98.2 orally.  Pulses 107  but jumped to as high as 160.  Respiration was 35 and her O2 sat was low  and was brought up with BiPAP.  Blood pressure is 103/61, the patient is  here with her husband.  She is oriented to person, time and place.  Affect is normal.  HEENT:  Reveals  no xanthelasma.  She has normal extraocular motion.  There are no carotid bruits.  There is no jugular venous distention.  LUNG EXAM:  Is difficult.  It is difficult to get her to take a deep  breath.  However, I believe her left lung is clear.  There is decreased  breath sounds in her right lung.  ABDOMEN:  Soft.  CARDIAC:  Exam reveals an S1 with an S2 and an irregularly irregular  rhythm.  She has no significant peripheral edema.   EKG reveals no acute change.  There is atrial fib with a rapid rate.  Chest x-ray reveals right lower lobe consolidation and no definite signs  of heart failure.  Her hemoglobin is 13.6 and white blood count is 7700.  BUN is 18 and creatinine 1.3.  Her BNP is 2484.   PROBLEMS:  1. History of atrial fibrillation and flutter.  In May 2009 she had      flutter ablation.  2. Coumadin therapy.  3. Coronary disease with stent to the circumflex in 2003 and left      heart catheterization in 2008 with data outlined above.  4. Diabetes.  5. Chronic obstructive pulmonary disease.  6. Aortoiliac disease status post stent to the iliac artery.  7.  History of a pituitary tumor with recurrence seen by [facility] in January      2008.  This is followed at [facility]  8. Status post cholecystectomy, hysterectomy, total knee arthroplasty      and a total hip arthroplasty.  9. Normal left ventricular function.  10.History of hypertension.  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  12.Presentation today with rapid atrial fibrillation, right lower lobe      consolidation and hypoxia and shortness of breath.  The B-type      natriuretic peptide is elevated.  At this point it is difficult to      know exactly why she is short of breath.  However, I am not      convinced that she is volume overloaded.  I believe that her      shortness of breath most likely is more pulmonary than cardiac.  I      am suggesting the use of IV Cardizem for her rate control.  I      suggest further evaluation of her lungs and treatment for possible      infection and possible pulmonary embolus.  If there is no      significant improvement we can and her heart rate is controlled, we      can consider a trial of diuresis but I would not push this issue to      start.  She has already received some diuretics in the emergency      room and she is not making significant urine from this.  We will      follow carefully.      Luis Abed, MD, [facility]  Electronically Signed     JDK/MEDQ  D:  12/18/2007  T:  12/18/2007  Job:  [id number]   cc:   Willaim Sheng D. Bean, FNP  Jesse Sans. Wall, MD, [facility]

## 2010-09-29 NOTE — Progress Notes (Signed)
[facility]                        PERIPHERAL VASCULAR OFFICE NOTE   NAME:WILSONCashay, Manganelli                    MRN:          [id number]  DATE:02/13/2007                            DOB:          1923-08-08    [name] was seen in followup at the [facility] Peripheral Vascular  Office on February 13, 2007.  [name] is a delightful 77 year old  woman who has been diagnosed with right subclavian artery and  brachiocephalic stenosis.  She has a marked blood pressure differential  between the right and left arm and has to-and-flow in the right  vertebral artery.  She has complained of dizziness and symptoms that are  fairly typical for subclavian steal.  I saw her initially on September  10th and ordered a CT angiogram to evaluate the anatomy of the great  vessels.  This demonstrated moderately severe stenosis in the proximal  right brachiocephalic artery as well as mild stenosis in the right  axillary artery, the left subclavian artery had only mild stenosis.   [name] symptoms are unchanged from her initial evaluation.  She  presents with her son-in-law today.   CURRENT MEDICINES:  1. Benazepril 40 mg a day.  2. Metformin 500 mg twice daily.  3. Aspirin 325 mg daily.  4. Metoprolol succinate 50 mg at bedtime.  5. Amlodipine 10 mg daily.  6. Simvastatin 40 mg daily.   ALLERGIES:  NKDA.   EXAM:  Weight is 169 pounds, blood pressure in the left arm is 123/60,  Rice rate 68.  There is a loud right subclavian bruit.  The remainder  of the exam was deferred as she has recently undergone a full exam and  symptoms are unchanged.   ASSESSMENT:  [name] has evidence of clinically significant right  innominate stenosis.  I brought her back to clinic to review findings  and treatment options in detail.  I think her symptoms warrant  revascularization, but the risks are significant because of arch  anatomy, advanced age, and poor functional  status.  The question is  whether to perform percutaneous revascularization with innominate  stenting, perform surgical bypass, or manage her conservatively.  My  biggest concern with performing a percutaneous procedure in the  brachiocephalic artery as that it is more technically challenging than a  left subclavian and I have some concern about an increased stroke risk  as the carotid as well as the subclavian originate from this vessel.  I  am going to review the study with Dr. Madilyn Fireman, in Vascular Surgery, and  provide final recommendations.  I will be in contact with [name] in  the next week.  If we elect  to perform an endovascular procedure, she will need to be started on  clopidogrel prior to the procedure.     Veverly Fells. Excell Seltzer, MD  Electronically Signed    MDC/MedQ  DD: 02/19/2007  DT: 02/19/2007  Job #: [id number]   cc:   Thomas C. Wall, MD, [facility]

## 2010-09-29 NOTE — Assessment & Plan Note (Signed)
[facility] OFFICE NOTE   JENNIPHER, WEATHERHOLTZ                    MRN:          [id number]  DATE:07/14/2007                            DOB:          03-Jun-1923    [name] returns today for a close followup of the following issues:  She actually comes in today because of just feeling tired and fatigued.  She says she is of no value and she is concerned people think she is  lazy!   I reminded her she is 77 years of age, has worked hard all her life and  her children think she is very responsible and puts too much pressure on  herself.  We confirm this with her daughter today, Garfield Cornea, as well.   PROBLEM LIST:  As follows:  1. Coronary artery disease, currently stable with no angina.  2. Normal left ventricular function.  3. Hypertension, under excellent control today.  4. Hyperlipidemia.  5. Type 2 diabetes.  6. Severe peripheral vascular disease and cerebrovascular disease.      She has a significant discrepancy in her right and left upper      extremity blood pressures as noted in previous notes.  She is for      medical therapy only, having had careful review by Dr. Tonny Bollman and Dr. Liliane Bade.   MEDICATIONS:  She is not taking her amlodipine or simvastatin.  We have  tried to reinforce the importance of taking these in the past and it  does not seem to be improving her compliance.  The medicine she is  taking is:  1. Benazepril 40 mg a day.  2. Metformin 500 mg p.o. b.i.d.  3. Enteric-coated aspirin 325 mg a day.  4. Metoprolol succinate 50 mg a day.  5. She carries nitroglycerin sublingual.   PHYSICAL EXAMINATION:  Her blood pressure is 132/70 in the left arm.  We  do not bother checking it in the right arm.  Her pulse is 65 and  regular.  Her weight is 169 and stable.  SKIN:  Warm and dry.  Alert and oriented.  She seems a little depressed.  HEENT:  Unchanged.  Carotids reveal  bilateral bruits.  Thyroid is not enlarged.  Trachea is  midline.  LUNGS:  Clear.  HEART:  Reveals a regular rate and rhythm without gallop.  ABDOMEN:  Soft.  EXTREMITIES:  Reveal no edema.  Pulses were present, but reduced in the  lower extremities particularly.   ASSESSMENT AND PLAN:  I think [name] is stable from our standpoint.  Her fatigue and lack of feeling good about herself seem to be the  biggest issues.  Compliance is also a problem, but I do not think she is  going to ever take the amlodipine or the simvastatin on a regular basis.  We will continue with metoprolol, enteric-coated aspirin and benazepril.   I will plan on seeing her back in 3 months.     Thomas C. Daleen Squibb, MD, [facility]  Electronically Signed    TCW/MedQ  DD: 07/14/2007  DT: 07/15/2007  Job #: [id number]   cc:   Marne A. Milinda Antis, MD

## 2010-09-29 NOTE — Assessment & Plan Note (Signed)
[facility] OFFICE NOTE   BARBA, SOLT                    MRN:          [id number]  DATE:01/02/2008                            DOB:          [birth year]    Ms. Messineo comes in today for followup after being discharged from the  hospital.  She was admitted with acute hypoxic respiratory failure,  which was multifactorial.  Specifically, she had atrial fib with a rapid  ventricular rate.  She had acute-on-chronic diastolic heart failure and  she had right lower lobe pneumonia, and she has chronic obstructive lung  disease.  She has no complaints today except she needs a cigarette.   We finally were able to slow her down and convert her back to sinus  rhythm with amiodarone 400 b.i.d.  She remains on this drug.  We stopped  her flecainide during her hospital stay.  She is also on furosemide 40  mg a day finishing up an antibiotic today, and she also is on ramipril  10 mg a day, potassium 20 mEq a day, diltiazem 120 mg a day, metoprolol  25 b.i.d., simvastatin 40 mg a day, and metformin 500 b.i.d. and enteric-  coated aspirin 325 a day.  Her Coumadin has been monitored here in the  office and has been very difficult to control because of noncompliance  with her visits.  We are working on this.   Apparently, Dr. Ladona Ridgel wanted to have a stress test prior to her  hospitalization, but she did not show.  Apparently, there has been some  lackadaisical attitude by the family per our staff about getting her  here.  We checked in to see when she is due a pacemaker check.   We are treating her medically for coronary artery disease, so I do not  think it really matters.   Her exam today, her blood pressure is 157/80.  Her pulse is 60.  She is  in sinus rhythm by EKG.  Her weight is 164, which is actually down 14  pounds.  She is in no acute distress.  Skin is warm and dry.  HEENT is  unchanged.  Neck shows no JVD.   Lungs were clear with no rales or  rhonchi.  Heart reveals a regular rate and rhythm.  Abdominal exam is  soft.  Extremities reveal absolutely no edema.  Pulses are intact.  Neuro exam is intact.   Ms. Koffman is maintaining sinus rhythm.  We will decrease her amiodarone  to 400 mg a day and try to reinforce compliance with the warfarin and  pacer checks.  I will see her back in 4 weeks.  At that time, we will  decrease her amiodarone to 200 mg a day, check amiodarone labs, and  continue other medical therapy.  I have advised her not to smoke.     Thomas C. Daleen Squibb, MD, [facility]  Electronically Signed    TCW/MedQ  DD: 01/02/2008  DT: 01/03/2008  Job #: [id number]   cc:   Marne A. Milinda Antis, MD

## 2010-09-29 NOTE — Progress Notes (Signed)
[facility] OFFICE NOTE   NAME:[name], [name]                    MRN:          [id number]  DATE:09/30/2008                            DOB:          January 27, 1924    [name] returns today with a multitude of complaints.  She is a  pleasant, 77 year old woman, with ongoing tobacco abuse, severe  peripheral vascular disease, coronary disease, symptomatic bradycardia,  status post pacemaker insertion, hypertension, dyslipidemia, diabetes,  and anemia.  The patient's main complaint today is generalized fatigue  and malaise, lack of energy, poor appetite, not sleeping well.  She does  have some specific arthritic complaints in her knees and ankles.  She  notes left-sided peripheral edema.   MEDICATIONS:  Her medicines include,  1. Aspirin 325 a day.  2. Simvastatin 40 a day.  3. Klor-Con 20 a day.  4. Diltiazem 120 a day.  5. Metformin 500 twice daily.  6. Benazepril 20 two tablets daily.  7. Metoprolol 25 mg twice daily.   PHYSICAL EXAMINATION:  GENERAL:  She is a pleasant, elderly-appearing  woman, in no acute distress.  VITAL SIGNS:  Blood pressure was 170/80, the pulse 77 and regular,  respirations were 18, and weight was 156 pounds.  NECK:  No jugular venous distention.  LUNGS:  Clear bilaterally with auscultation except for scattered rales  and wheezes.  CARDIOVASCULAR:  Regular rate and rhythm.  Normal S1 and S2.  PMI was  not enlarged or laterally displaced.  ABDOMEN:  Soft, nontender.  EXTREMITIES:  Demonstrated left-sided 1+ peripheral edema, right-sided  trace peripheral edema.   Interrogation of her pacemaker demonstrates a [employer] Zephrex LDR.  The  P-waves were 122.  The R-waves greater than 12.  The impedance 334 in  the A, 468 in the ventricle, threshold 0.5 at 0.4 in the A, 0.75 and 0.4  in the RV.   IMPRESSION:  1. Symptomatic bradycardia.  2. Status post pacemaker  insertion with normal pacemaker function      today.  3. Severe fatigue and malaise, unclear etiology.   DISCUSSION:  The patient's symptoms are quite bothersome to her.  I am  not quite sure what is going on.  The patient is scheduled to undergo  followup with Dr. Everrett Rice in the next  several weeks.  At that time, we will allow her to have blood work done  rather than doing today.  I have encouraged to try to maintain her  activity, maintain a low-salt diet, and stop smoking.     Doylene Canning. Ladona Ridgel, MD  Electronically Signed    GWT/MedQ  DD: 09/30/2008  DT: 10/01/2008  Job #: [id number]   cc:   Billie D. Bean, FNP

## 2010-09-29 NOTE — H&P (Signed)
NAME:  [name], [name]             ACCOUNT NO.:  [id number]   MEDICAL RECORD NO.:  [id number]          PATIENT TYPE:  INP   LOCATION:                               FACILITY:  [facility]   PHYSICIAN:  Darryl D. Prime, MD    DATE OF BIRTH:  Jan 24, 1924   DATE OF ADMISSION:  01/19/2008  DATE OF DISCHARGE:                              HISTORY & PHYSICAL   The patient is full code.   PRIMARY CARE PHYSICIAN/NURSE PRACTITIONER:  Atha Starks. Bean, FNP,  [place].   The patient's total visit time was approximately 63 minutes.  The  patient's daughter was a historian.  She is a good historian.   CARDIOLOGISTS:  Dr. Jesse Sans. Wall and Dr. Doylene Canning. Ladona Ridgel.   CHIEF COMPLAINT:  Shoulder pain.   HISTORY OF PRESENT ILLNESS:  [name] is an 76 year old female with a  history of coronary disease status post myocardial infarction with circ  intervention in 2003.  She has a history of preserved ejection fraction  with significant diastolic heart failure chronically and had a recent  acute admission in August 2009.  She has a history of pneumonia in  August 2009 as well.  The patient is on Coumadin for history of atrial  fibrillation and atrial flutter.  She is status post atrial flutter  ablation in May 2009 with a dual-chamber device placed afterwards.  The  patient notes over the last 2 nights severe right shoulder pain that  occurred apparently rather suddenly.  The patient could not give a good  history, and she lives with her son who is not present and her daughter  at its best she could tried to give a history.  The patient has not  moved her shoulder due to the pain.  The patient went to see Dr. Everrett Coombe today and noted her INR was way out and had her present to the  emergency room.  She has not had sleep because of the pain and she has  also been recently infused.  In the emergency room, she was given 2  units of fresh frozen plasma, vitamin K 10 p.o., morphine 2 mg IV, and  the CT of the  shoulder on the right is ordered which showed subluxation  with no evidence of hemarthrosis.   PAST MEDICAL HISTORY AND PAST SURGICAL HISTORY:  History of  hypertension, history of hyperlipidemia, history of coronary artery  disease as above, history of COPD, status post hysterectomy, she is  status post total knee and total hip surgeries, she has a history of  ongoing tobacco abuse in the form of cigarettes and also in the form of  smokeless tobacco, longstanding history of peripheral vascular disease,  status post iliac stent, history of chronic headache, history of  pituitary tumor, and history of medical nonadherence in the past.   ALLERGIES:  She has no known drug allergies.   MEDICATIONS:  1. She is on amiodarone 200 mg daily.  2. Lasix 40 mg daily.  3. Potassium 20 mEq p.o. daily.  4. Diltiazem 120 mg daily.  5. Long-acting metoprolol XL 25 mg  daily.  6. Simvastatin 40 mg daily.  7. Metformin 500 mg twice a day.  8. Aspirin 325 mg daily.  9. Coumadin as directed.  She is currently taking 5 mg and 10 mg      alternatively, 10 mg was on Sunday.  10.The patient is on Altace 10 mg daily.  11.Spiriva inhalers daily.   SOCIAL HISTORY:  As above.   FAMILY HISTORY:  Positive for coronary artery disease in her mother, but  was not premature.   REVIEW OF SYSTEMS:  Could not be fully obtained due to the patient being  not verbal due to the pain and drowsiness from recent Ativan dosing,  which was needed to keep her calm for the CT scan.   PHYSICAL EXAMINATION:  VITAL SIGNS:  Temperature is 97.5 with a pulse of  76, respiratory rate of 15, blood pressure 118/62 with sats 96% on room  air.  GENERAL:  She is a female and looks much younger than stated age, in  acute pain.  HEENT:  Normocephalic and atraumatic.  Her pupils are equal, round, and  reactive to light.  Extraocular movements are intact.  Oropharynx shows  no posterior pharyngeal lesions.  NECK:  Supple with no  lymphadenopathy or thyromegaly.  No carotid  bruits.  LUNGS:  Clear to auscultation bilaterally.  CARDIOVASCULAR:  Regular rhythm and rate, distant.  ABDOMEN:  Soft, nontender, and nondistended with no hepatosplenomegaly.  EXTREMITIES:  No clubbing, cyanosis, or edema.  MUSCULOSKELETAL:  Strictly concerning right shoulder shows no evidence  of step-off deformities or bruising.  She will not move the joint due to  pain and range of motion could not be assessed as it is.   LABORATORY DATA:  White count of 16.4 with a hemoglobin of 11.1,  hematocrit 32.9 with platelets of 350.  Sodium 137 with a potassium of  3.7, chloride 97, bicarb 26, BUN 50, creatinine 1.5 with a glucose of  117.  INR was greater than 9.8.  Chest x-ray is pending to rule out  possible associated pneumonia.  EKG is pending.  Urinalysis is pending.  X-rays of the right shoulder showed mild inferior subluxation of the  right humeral head.   ASSESSMENT/PLAN:  The patient who has had right shoulder pain, unsure if  she fell on it.  There is no evidence of fracture, there is evidence of  subluxation, there is no evidence of hemarthrosis.  We will have  Orthopedics see the patient for this.  We will give her pain medications  as well.  For her elevated INR, we will follow her INR after the above  intervention including fresh frozen plasma and vitamin K was given.  We  will continue her home medications except for the oral hypoglycemic as  she is not eating as much now.  We will counsel concerning smoking  cessation.  Deep venous thrombosis prophylaxis will be with pneumatic  compression device and gastrointestinal prophylaxis with Zantac.      Darryl D. Prime, MD  Electronically Signed     DDP/MEDQ  D:  01/19/2008  T:  01/20/2008  Job:  [id number]

## 2010-09-29 NOTE — Assessment & Plan Note (Signed)
[facility] OFFICE NOTE   Virginia, Rice                    MRN:          [id number]  DATE:06/11/2008                            DOB:          May 04, 1924    [name] comes in today for followup.   PROBLEM LIST:  1. Coronary artery disease.  She had a previous stent into the      circumflex.  She has diffuse disease of right coronary artery      system being treated medically.  Her last catheterization was on      May 27, 2006.  2. Peripheral vascular disease with stent in the right iliac.  This      was determined by a catheterization on May 27, 2006, as well.  3. Well-preserved left ventricular function, ejection fraction 50-55%.      Last stress Myoview on December 04, 2004.  4. Symptomatic bradycardia.  5. Paroxysmal atrial fibrillation, well controlled on amiodarone.  6. History of atrial flutter ablation.  7. History of chronic diastolic heart failure.  8. Tobacco use ongoing  9. History of noncompliance.  10.History of nonobstructive carotid disease with a suggestion of a      right subclavian steel being managed medically.   She offers no complaints today.  She states her arthritis bothers so bad  in her knees that she just cannot get around.   She brings a medicine back today, which is devoid of potassium,  diltiazem, amiodarone, and iron sulfate.   MEDICATIONS:  Her meds that she has today include;  1. Aspirin 325 mg per day.  2. Simvastatin 40 mg a day.  3. Metformin 500 mg p.o. b.i.d.  4. Voltaren gel b.i.d.  5. Benazepril 40 mg a day.  6. Metoprolol 25 mg p.o. b.i.d..   PHYSICAL EXAMINATION:  GENERAL:  She looks depressed.  She is in no  acute distress.  VITAL SIGNS:  Her blood pressure is 112/76 and pulse is 60 and regular.  Her weight is 165 pounds, which is identical to last time.  HEENT:  Unchanged, normal.  NECK:  Carotid upstrokes were equal bilaterally with bilateral  bruits,  right greater than left.  The right does sound like a to-and-fro sound.  LUNGS:  Inspiratory and expiratory rhonchi, clear partially with cough.  HEART:  A regular rate and rhythm.  No gallop or rub or murmur.  PMI  could not be appreciated.  ABDOMEN:  Soft, good bowel sounds.  Organomegaly cannot be appreciated.  EXTREMITIES:  No cyanosis, clubbing, or edema.  Pulses were present, but  reduced.  NEURO:  Grossly intact.   Her EKG confirms sinus rhythm with a heart rate of 60 beats per minute.  Her PR, QRS, and QTC intervals were normal.  We also performed an EKG  with the magnet, which showed AV sequential pacing.   ASSESSMENT AND PLAN:  [name] is stable from my standpoint.  We have  given her a typed out list of her medications and I have asked her  daughter to call us.  She is accompanied with her.  We need to make sure  she is on potassium, diltiazem, amiodarone, and her iron.   We will also get amiodarone labs today.  I will plan on seeing her back  again in 6 months.     Thomas C. Daleen Squibb, MD, [facility]  Electronically Signed    TCW/MedQ  DD: 06/11/2008  DT: 06/12/2008  Job #: [id number]

## 2010-09-29 NOTE — Consult Note (Signed)
NAME:  [name]             ACCOUNT NO.:  [id number]   MEDICAL RECORD NO.:  [id number]          PATIENT TYPE:  INP   LOCATION:  5153                         FACILITY:  [facility]   PHYSICIAN:  Dionne Ano. Gramig, M.D.DATE OF BIRTH:  28-Nov-1923   DATE OF CONSULTATION:  DATE OF DISCHARGE:                                 CONSULTATION   I had the pleasure to see [name] today in the [facility].  She was admitted on January 19, 2008, in regards to her  shoulder predicament.  The patient has a diagnosis of anemia and right  shoulder arthritis.  She also has leukocytosis, hypertension, COPD,  pituitary tumor, hyperlipidemia, and chronic heart failure.  I was asked  to see her as she complains significantly of shoulder pain.  I discussed  with the patient her findings.  I should note that she has had an  admission and ortho consult was asked for.  I was on-call today on  January 23, 2008.  I was asked to see her by my office.  I am happy to  see her and treat her.   I should note that she did come into the hospital with INR and PTT that  were well out of range in terms of the parameters.  On January 20, 2008, her INR was 3.8.  There was likely bleed in the  shoulder/hemarthrosis due to the anticoagulation issues.   Charts have been reviewed and all questions encouraged and answered.   Past medical history is reviewed.   ALLERGIES:  None.   Medicines are reviewed in the chart.   PAST MEDICAL HISTORY:  1. Hypertension.  2. Hyperlipidemia.  3. Coronary artery disease.  4. COPD, status post hysterectomy.  5. Status post total knee and hip surgeries.  6. She has a history of tobacco abuse and uses smokeless tobacco.  7. She has long-standing peripheral vascular disease, status post      iliac stent placement.  8. History of chronic headaches as well as pituitary tumor.  9. History of some medical nonadherence in the past according to the      notes.   Her  examination is highlighted by intact hand, elbow, and forearm exam  without abnormalities in her vascular examination.  She complains of  shoulder pain and has mild swelling anterolaterally as well as  anteriorly.  Chest wall is nontender.  There is no signs of compartment  syndrome dystrophy or obvious infection.  There is no evidence of  erythema, cellulitis, or excessive heat.   The patient has a left upper extremity, which is neurovascularly intact.  Normal alignment, stability and  range of motion.   X-rays show extensive calcific change throughout the shoulder with  arthritic change and calcific tendinitis sequelae.  It is noted on plain  film as well as her CT scan.  There is no fracture dislocation or space-  occupying lesion.   IMPRESSION:  Right shoulder pain secondary to hemarthrosis secondary to  bleeding issues and her anticoagulation issues, which she presented with  (INR 3.8).  This patient also has significant chronic  calcific changes  in her shoulders and arthritis.   PLAN:  Given her presentation, I have recommended an aspiration to make  sure she does not have any signs of infection.   PROCEDURE NOTE:  The patient was prepped and draped.  Following this,  she underwent an aspiration of the affected right shoulder.  I was able  to withdraw 20 mL of thick bloody discharge.  This was noninfectious and  appeared to be secondary to hematoma.  I feel this signifies the  diagnosis and rules out infection, however, for completeness sake.  I  did send aerobic and anaerobic cultures and will follow up on this.   I have discussed these issues with the patient.  I feel that she has  arthritis and hematoma, which causes her pain the shoulder.  I  recommended pain medicine, warm compress, and a continued medical  management.  I would not recommend any aggressive surgical evacuation  unless her condition worsened significantly.  It was a pleasure to see  her today.  All  questions have been encouraged and answered.      Dionne Ano. Amanda Pea, M.D.  Electronically Signed     WMG/MEDQ  D:  01/23/2008  T:  01/24/2008  Job:  [id number]

## 2010-09-29 NOTE — Discharge Summary (Signed)
NAME:  Virginia, [name]             ACCOUNT NO.:  [id number]   MEDICAL RECORD NO.:  [id number]          PATIENT TYPE:  INP   LOCATION:  4731                         FACILITY:  [facility]   PHYSICIAN:  Maple Mirza, PA   DATE OF BIRTH:  [birth year]   DATE OF ADMISSION:  10/12/2007  DATE OF DISCHARGE:  10/13/2007                               DISCHARGE SUMMARY   ALLERGIES:  This patient has no known drug allergies.   FINAL DIAGNOSIS:  1. Discharging day 1, status post electrophysiology      study/radiofrequency catheter ablation of a typical atrial flutter.      a.     Atrial fibrillation noted.      b.     The patient started on flecainide 100 mg b.i.d. Oct 12, 2007, in the evening.  2. Day 1, status post implant of [employer] Zephyr XL DR dual-chamber      pacemaker.      a.     Episodes of retrograde conduction initiated by PVC.  The       PVARP was lengthened  at interrogation.      b.     Pacemaker implanted for symptomatic bradycardia/syncope and       posttermination pauses.   SECONDARY DIAGNOSES:  1. History of atrial fibrillation/flutter diagnosed on admission, Oct 07, 2007.  2. CHADS=4.  3. Coumadin started Oct 10, 2007.  4. NSTEMI 2003.      a.     Stent to left circumflex placed in 2003.      b.     The left heart catheterization 2008, the stent in left       circumflex was patent.  5. Diabetes.  6. Chronic obstructive pulmonary disease, history of tobacco      habituation.  7. Aortoiliac occlusive disease, status post stent in the iliac      artery, patent on catheterization 2008.  8. Pituitary tumor with recurrence seen on MRA May 25, 2006.  The      patient is following up at [facility] with surveillance going forward.  9. Status post cholecystectomy, hysterectomy, total knee arthroplasty,      total hip arthroplasty.  10.2-D echocardiogram January 2008, ejection fraction of 75-80% with      akinesis of the inferior wall.  Once again, the patient was  started      on Coumadin therapy during her admission Oct 07, 2007, to Oct 10, 2007, for new diagnosis of atrial fibrillation/typical atrial      flutter.   PROCEDURE:  1. On Oct 12, 2007, electrophysiology study, radiofrequency catheter      ablation of typical atrial flutter, atrial fib was noted.  2. Procedure Oct 12, 2007, implant of a [employer]. Jude Zephyr dual-chamber      pacemaker for symptomatic bradycardia, syncope, and post      termination pauses by Dr. Lewayne Bunting.  The patient has had no      postprocedural complications relating to implant of the pacemaker  at the incision site.   BRIEF HISTORY:  [name] is an 77 year old female.  She has a history  of coronary artery disease.  She had a stent placed and left circumflex  in 2003.   PAST MEDICAL HISTORY:  Also includes diabetes, hypertension, and  dyslipidemia.   The patient presented to the emergency room Oct 07, 2007, with  presyncope.  She had nausea and diaphoresis.  There was no true syncope,  but she had presyncopal symptoms.  She also had very mild chest  discomfort and palpitations.   In the emergency room, she was noted to have a fast heart rate.  She was  started on Diltiazem with bolus and drip with subsequent rate control.  The patient has had recent complaints of dyspnea on exertion which have  become progressively worse.  The patient now feels better after her  heart rate has subsided.  The telemetry shows what looks to be atrial  flutter.  The patient was placed on rate control with beta-blocker.  Her  Coumadin anticoagulation was started, and she was asked to present on  Oct 12, 2007, for a dual procedure implantation of a pacemaker because  during the hospitalization on Oct 07, 2007, long pauses were noted, post  termination pauses and a second part of procedure would be a  radiofrequency catheter ablation of typical atrial flutter.   HOSPITAL COURSE:  The patient presented Oct 12, 2007, she  underwent  successful radiofrequency catheter ablation of a typical atrial flutter.  Atrial fibrillation was at baseline rhythm.  She also had an  implantation of the dual-chamber pacemaker as mentioned above.  The  patient was started on flecainide after the procedure.  She was noted to  have pacemaker-mediated tachycardia after PVC.  The device was re-  interrogated and the PVARP was lengthened to lessen the device response  to PVCs.  She was also started on flecainide and her metoprolol was  continued.  The patient has been fairly hypotensive after the procedure  responding by weekly to IV fluid boluses.  Therefore, her benazepril was  being placed on hold.   OTHER MEDICATIONS:  She was taking at discharge include,  1. Metformin 500 mg twice daily.  2. Metoprolol 25 mg twice daily.  3. Enteric-coated aspirin 325 mg daily.  4. Zocor 40 mg daily at bedtime.  5. Coumadin 5 mg daily.  6. Nitroglycerin 0.4 mg 1 tablet under the tongue every 5 minutes x3      doses as needed for chest pain.  7. Flecainide 100 mg twice daily.  This is a new medication to keep      her heart in rhythm.   Once again, she is to stop her benazepril.  She is to keep her incision  dry for next 7 days.  Sponge bathe until Thursday, October 19, 2007.  Mobility of the left arm has been discussed with the patient as well as  incision care.  Chest x-ray has been reviewed, and there is no  pneumothorax.  The pacemaker leads are in appropriate position.  The  device has been re-interrogated at the day of discharge.  All values  within normal limits.  Ms. Rice has followup at [facility] on  [address].  1. Coumadin Clinic to check the blood thinner on Tuesday, October 17, 2007, at 2:00 p.Virginia.  Target INR is between 2-3.  2. Stress test Thursday, October 19, 2007, at 7:15 in  the morning.  She is      asked to eat nothing after midnight Wednesday, October 18, 2007.  3. Pacer Clinic Monday, October 30, 2007, at  9 o'clock to check the      pacemaker incision and to answer questions going forward.  4. To see Dr. Ladona Rice Tuesday, January 16, 2008, at 9:40.   LABORATORY STUDIES THIS ADMISSION:  Complete blood count on Oct 12, 2007, hemoglobin 12.8, hematocrit 38.1, white cells of 10.6, and  platelets of 301.  Serum electrolytes updated Oct 08, 2007, sodium is  140, potassium 3.8, chloride 102, carbonate 29, BUN is 10, creatinine  was 0.9, glucose 181.  On the day of discharge, protime was 14.3, INR is  1.1.      Maple Mirza, PA     GM/MEDQ  D:  10/13/2007  T:  10/14/2007  Job:  [id number]   cc:   Thomas C. Daleen Squibb, MD, [facility]  Billie D. Bean, FNP

## 2010-09-30 ENCOUNTER — Ambulatory Visit (INDEPENDENT_AMBULATORY_CARE_PROVIDER_SITE_OTHER): Payer: Medicare Other | Admitting: Cardiovascular Disease

## 2010-09-30 ENCOUNTER — Encounter: Payer: Self-pay | Admitting: Cardiovascular Disease

## 2010-09-30 DIAGNOSIS — I739 Peripheral vascular disease, unspecified: Secondary | ICD-10-CM

## 2010-09-30 DIAGNOSIS — E7800 Pure hypercholesterolemia, unspecified: Secondary | ICD-10-CM

## 2010-09-30 DIAGNOSIS — F17200 Nicotine dependence, unspecified, uncomplicated: Secondary | ICD-10-CM

## 2010-09-30 DIAGNOSIS — I2510 Atherosclerotic heart disease of native coronary artery without angina pectoris: Secondary | ICD-10-CM

## 2010-09-30 DIAGNOSIS — I509 Heart failure, unspecified: Secondary | ICD-10-CM

## 2010-09-30 DIAGNOSIS — I4891 Unspecified atrial fibrillation: Secondary | ICD-10-CM

## 2010-09-30 DIAGNOSIS — J449 Chronic obstructive pulmonary disease, unspecified: Secondary | ICD-10-CM

## 2010-09-30 DIAGNOSIS — F4322 Adjustment disorder with anxiety: Secondary | ICD-10-CM

## 2010-09-30 DIAGNOSIS — J4489 Other specified chronic obstructive pulmonary disease: Secondary | ICD-10-CM

## 2010-09-30 DIAGNOSIS — R0989 Other specified symptoms and signs involving the circulatory and respiratory systems: Secondary | ICD-10-CM

## 2010-09-30 DIAGNOSIS — R42 Dizziness and giddiness: Secondary | ICD-10-CM

## 2010-09-30 DIAGNOSIS — R0789 Other chest pain: Secondary | ICD-10-CM

## 2010-09-30 DIAGNOSIS — I10 Essential (primary) hypertension: Secondary | ICD-10-CM

## 2010-09-30 DIAGNOSIS — I5032 Chronic diastolic (congestive) heart failure: Secondary | ICD-10-CM

## 2010-09-30 NOTE — Assessment & Plan Note (Signed)
She does continue to have periodic atypical chest pain. No further workup is needed at this time. I suspect that her symptoms could be secondary to underlying depression or noncardiac issues.

## 2010-09-30 NOTE — Assessment & Plan Note (Signed)
She does have significant carotid arterial disease. We'll continue aggressive medical management with periodic ultrasound of her carotids on an annual basis

## 2010-09-30 NOTE — Progress Notes (Signed)
Patient ID: [name], female    DOB: [birth year], 77 y.o.   MRN: [id number]  HPI Comments: [name] is a very pleasant 77 year old woman with past medical history of nonobstructive coronary artery disease, stent placed to her left circumflex in 2003 which was patent on catheterization in 2008, history of peripheral vascular disease with bilateral carotid disease, 79% of the right internal carotid, 40-59% left internal carotid artery, also history of COPD, history of respiratory failure in August of 09 secondary to rapid fibrillation and diastolic relaxation abnormality, history of sick sinus syndrome with ablation in May of 2009 with discontinuation of Coumadin in September of 09 secondary to bleeding. She presents for routine followup.    She reports that she has insomnia, does not sleep until 3 AM on a regular basis. She sleeps most of the morning. She has no energy to do anything during the daytime and spends much of her time at home. She does not feel well overall and she believes this is from a poor sleep pattern.She reports that a close family member recently passed away. This is on her mind.  She was recently in the hospital on May 6 at [facility]. She was admitted with chest pain, malaise, fatigue. Her workup was negative including negative cardiac enzymes, additional workup was unrevealing and she was discharged with followup in our clinic today.   She is very bothered by her knee pain which is worse on the left than the right. She has a history of knee replacement bilaterally over 10 years ago. She states that she seen her orthopedic physician sometime ago and x-rays were done though nothing was performed. She has tried Aleve and Tylenol with no improvement of her pain.  EKG shows normal sinus rhythm with rate 73 beats per minute with no significant ST or T wave changes     Review of Systems  Constitutional: Positive for fatigue.       Insomnia  HENT: Negative.     Eyes: Negative.   Respiratory: Negative.   Cardiovascular: Negative.   Gastrointestinal: Negative.   Musculoskeletal: Negative.   Skin: Negative.   Neurological: Negative.   Hematological: Negative.   Psychiatric/Behavioral: Positive for sleep disturbance and dysphoric mood.  All other systems reviewed and are negative.    BP 132/58  Pulse 73  Ht 5\' 5"  (1.651 m)  Wt 139 lb (63.05 kg)  BMI 23.13 kg/m2   Physical Exam  Nursing note and vitals reviewed. Constitutional: She is oriented to person, place, and time. She appears well-developed and well-nourished.  HENT:  Head: Normocephalic.  Nose: Nose normal.  Mouth/Throat: Oropharynx is clear and moist.  Eyes: Conjunctivae are normal. Pupils are equal, round, and reactive to light.  Neck: Normal range of motion. Neck supple. No JVD present.  Cardiovascular: Normal rate, regular rhythm, S1 normal, S2 normal and intact distal pulses.  Exam reveals no gallop and no friction rub.   Murmur heard.  Systolic murmur is present with a grade of 2/6  Pulmonary/Chest: Effort normal and breath sounds normal. No respiratory distress. She has no wheezes. She has no rales. She exhibits no tenderness.  Abdominal: Soft. Bowel sounds are normal. She exhibits no distension. There is no tenderness.  Musculoskeletal: Normal range of motion. She exhibits no edema and no tenderness.  Lymphadenopathy:    She has no cervical adenopathy.  Neurological: She is alert and oriented to person, place, and time. Coordination normal.  Skin: Skin is warm and dry. No  rash noted. No erythema.  Psychiatric: She has a normal mood and affect. Her behavior is normal. Judgment and thought content normal.         Assessment and Plan

## 2010-09-30 NOTE — Assessment & Plan Note (Signed)
She has significant insomnia by report on today's visit. She seems very somnolent, almost depressed. I suggested she talk to Dr. Dayton Martes to see if there is something that she can do for sleep. She did mention the loss of a family member, I believe it was her sister. Her concern that she could be depressed about her recent loss.

## 2010-09-30 NOTE — Patient Instructions (Signed)
You are doing well. No medication changes were made. Please call us if you have new issues that need to be addressed before your next appt.  We will call you for a follow up Appt. In 6 months We have scheduled you for an appt with Dr. Dayton Martes a [facility] for this Friday 10/02/10 @ 3:15, Please arrive 15 minutes early for check-in.

## 2010-09-30 NOTE — Assessment & Plan Note (Signed)
Blood pressure is well controlled on today's visit. No changes made to the medications. 

## 2010-09-30 NOTE — Discharge Summary (Signed)
NAME:  [name], [name]             ACCOUNT NO.:  [id number]  MEDICAL RECORD NO.:  [id number]           PATIENT TYPE:  I  LOCATION:  2004                         FACILITY:  [facility]  PHYSICIAN:  Colleen Can. Deborah Chalk, M.D.DATE OF BIRTH:  [birth year]  DATE OF ADMISSION:  09/20/2010 DATE OF DISCHARGE:  09/21/2010                              DISCHARGE SUMMARY   PRIMARY CARDIOLOGIST:  Antonieta Iba, MD  DISCHARGE DIAGNOSIS:  Noncardiac chest pain. A.  Atypical with serial negative cardiac enzymes.  SECONDARY DIAGNOSES: 1. Coronary artery disease.     a.     Cardiac catheterization in 2008 with 40% left main, large      left anterior descending artery wrapping around the apex with 25%      stenosis, diffuse stenosis in right coronary artery which was a      small vessel and 95% narrowing with collaterals artery in place,      circumflex and atrioventricular groove had long calcified lesion      in about 35%, mid stent widely patent mid obtuse marginal large      with ostial 25% stenosis, moderate obtuse marginal 2 free of      significant disease.  Normal left ventricular function.     b.     Status post ST segment elevation myocardial infarction with      stent to the circumflex, 2003. 2. Peripheral vascular disease.     a.     Stents in right iliac artery (date unknown, patent per      cardiac catheterization 2008). 3. Carotid artery stenosis.     a.     60-79% right internal carotid artery and 40-59% left      internal carotid artery. 4. History of atrial flutter.     a.     Status post radiofrequency catheter ablation, Oct 12, 2007. 5. Symptomatic tachy-brady syndrome.     a.     Status post [employer] Zephyr XL DR dual-chamber pacemaker      implantation, Oct 12, 2007. 6. Paroxysmal atrial fibrillation. 7. Chronic obstructive pulmonary disease. 8. Ongoing tobacco abuse. 9. History of respiratory failure, August 2009 (in the setting of     atrial fibrillation with rapid  ventricular response and diastolic     congestive heart failure). 10.Hyperlipidemia. 11.Non-insulin-dependent diabetes mellitus. 12.Chronic diastolic congestive heart failure. 13.Arthritis. 14.History of pituitary tumor.     a.     Removed and benign, December 2002.     b.     Status post gamma knife treatment at [facility]. 15.Chronic kidney disease, stage III.  PAST SURGICAL HISTORY: 1. Cholecystectomy. 2. Right and left total knee arthroplasty. 3. Right and left hip arthroplasty. 4. Please see other procedures listed as subsections under past     medical history.  ALLERGIES:  NAPROXEN (Unknown reaction).  PROCEDURES: 1. EKG on Sep 20, 2010:  Atrial pacing with J-point elevation in V3 and     V4. 2. Chest x-ray on Sep 21, 2010:  No evidence for active chest disease. 3. EKG on Sep 21, 2010:  NSR,  75 bpm, J-point elevation in V1 through     V3 with mild T-wave inversion in aVL only, otherwise T-wave     flattening in 1 with normal axis, no evidence of hypertrophy, PR     134, QRS 92 and QTc of 437.  HISTORY OF PRESENT ILLNESS:  [name] is an 77 year old pleasant female who has the above-noted complex medical history who was in her usual state of health until she began to feel weak and tired and has vague chest discomfort.  She called 911 and EKG was completed that showed with they thought was significant ST elevation in leads V3-V4 and a Code STEMI was called.  She was brought emergently to [facility] and EKG was reviewed by attending cardiologist/electrophysiologist, Dr. Lewayne Bunting.  The patient was interviewed and Code STEMI was called off.  EKG changes consistent with J-point elevation.  HOSPITAL COURSE:  The patient was admitted to the hospital and cardiac enzymes were cycled with one set of point-of-care markers and two full sets of enzymes that were negative.  She was then evaluated on the morning of Sep 21, 2010, by attending cardiologist,  Dr. Roger Shelter and as she had no further symptoms, plan will be to have the patient ambulate in the hallways and if she has no symptoms, discharge her home with early followup with primary cardiologist.  That appointment has been made for Sep 30, 2010 at 10:30 a.m. with Dossie Arbour at [facility].  The patient had no prescription for sublingual nitroglycerin as she carries a diagnosis of coronary artery disease.  She was given this prescription, otherwise no change to her preadmission medications except for recommendation for acetaminophen should her chest pain returned that is most likely noncardiac.  At the time of discharge, the patient was given a new medication list, prescriptions, followup instructions, and all questions and concerns were addressed prior to leaving the hospital.  DISCHARGE LABS:  Hemoglobin 15.0, hematocrit 44.  Protime 13.2, INR 0.98.  Sodium 137, potassium 4.4, chloride 101, BUN 36, creatinine 1.5. Point-of-care markers negative x1.  Two full sets of enzymes negative. MB on third check 0.9 and troponin of less than 0.3 on third check.  FOLLOWUP PLANS AND APPOINTMENT:  Please see end of hospital course.  DISCHARGE MEDICATIONS: 1. Acetaminophen 325 mg one - two tablets p.o. q.4 h. p.r.n. 2. Sublingual nitroglycerin 0.4 mg q.5 minutes up to three doses     p.r.n. for chest discomfort. 3. Enteric-coated aspirin 81 mg one tablet p.o. daily. 4. Benazepril 20 mg two tablets p.o. daily. 5. Carvedilol 6.25 mg one tablet p.o. b.i.d. 6. Metformin 500 mg one tablet p.o. b.i.d. 7. Simvastatin 40 mg one tablet p.o. nightly. 8. Tramadol 50 mg two tablets p.o. daily p.r.n.  DURATION OF DISCHARGE ENCOUNTER INCLUDING PHYSICIAN TIME:  Thirty five minutes.     Jarrett Ables, PAC   ______________________________ Colleen Can. Deborah Chalk, M.D.    MS/MEDQ  D:  09/21/2010  T:  09/21/2010  Job:  [id number]  cc:   Antonieta Iba, MD Doylene Canning.  Ladona Ridgel, MD Ruthe Mannan, M.D.  Electronically Signed by Jarrett Ables PAC on 09/23/2010 01:35:52 PM Electronically Signed by Roger Shelter M.D. on 09/30/2010 06:05:46 PM

## 2010-09-30 NOTE — Assessment & Plan Note (Signed)
No clinical signs of heart failure on today's exam. No significant medication changes were made

## 2010-10-02 ENCOUNTER — Encounter: Payer: Self-pay | Admitting: Family Medicine

## 2010-10-02 ENCOUNTER — Ambulatory Visit (INDEPENDENT_AMBULATORY_CARE_PROVIDER_SITE_OTHER): Payer: Medicare Other | Admitting: Family Medicine

## 2010-10-02 VITALS — BP 110/64 | HR 72 | Temp 98.6°F | Ht 63.0 in | Wt 138.1 lb

## 2010-10-02 DIAGNOSIS — F4322 Adjustment disorder with anxiety: Secondary | ICD-10-CM

## 2010-10-02 MED ORDER — MIRTAZAPINE 7.5 MG PO TABS: 7.5000 mg | ORAL_TABLET | Freq: Every day | ORAL | Status: DC

## 2010-10-02 NOTE — Progress Notes (Signed)
   Patient ID: [name], female    DOB: 09-29-1923, 77 y.o.   MRN: [id number]  HPI Comments: [name] is a very pleasant 77 year old woman with past medical history of nonobstructive coronary artery disease,  COPD, here for ? Depression.  She was recently in the hospital on May 6 at [facility]. She was admitted with chest pain, malaise, fatigue. Her workup was negative including negative cardiac enzymes, additional workup was unrevealing and she has been closely followed by her cardiologist.   She recently saw Dr. Mariah Milling, her cardiologist who felt she seemed depressed.      She reports that she has insomnia, does not sleep until 3 AM on a regular basis. Once she falls asleep, she can stay asleep.  Usually sleeps through the morning and feels tired the rest of the day.  She has no energy to do anything during the daytime and spends much of her time at home. Daughter died last year, sometimes still thinks about her late at night. Not tearful.  Does not feel hopeless. Denies SI or HI.   Not eating much.  She is drinking ensure.  Has lost significant amount of weight since January 2012. Wt Readings from Last 3 Encounters:  10/02/10 138 lb 1.9 oz (62.651 kg)  09/30/10 139 lb (63.05 kg)  05/21/10 155 lb 8 oz (70.534 kg)    The PMH, PSH, Social History, Family History, Medications, and allergies have been reviewed in [facility], and have been updated if relevant.   Review of Systems  Constitutional: Positive for fatigue.       Insomnia  HENT: Negative.   Eyes: Negative.   Respiratory: Negative.   Cardiovascular: Negative.   Gastrointestinal: Negative.   Musculoskeletal: Negative.   Skin: Negative.   Neurological: Negative.   Hematological: Negative.   Psychiatric/Behavioral: Positive for sleep disturbance and dysphoric mood.  All other systems reviewed and are negative.   Physical Exam  BP 110/64  Pulse 72  Temp(Src) 98.6 F (37 C) (Oral)  Ht 5\' 3"  (1.6 m)  Wt 138 lb 1.9  oz (62.651 kg)  BMI 24.47 kg/m2 Constitutional: She is oriented to person, place, and time. She appears well-developed and well-nourished.  Psychiatric: She has a normal mood and affect. Her behavior is normal. Judgment and thought content normal.

## 2010-10-02 NOTE — Discharge Summary (Signed)
NAME:  [name], [name]                         ACCOUNT NO.:  [id number]   MEDICAL RECORD NO.:  [id number]                   PATIENT TYPE:  INP   LOCATION:  3707                                 FACILITY:  [facility]   PHYSICIAN:  Jesse Sans. Wall, M.D.                DATE OF BIRTH:  [birth year]   DATE OF ADMISSION:  08/24/2002  DATE OF DISCHARGE:  08/26/2002                           DISCHARGE SUMMARY - REFERRING   DISCHARGE DIAGNOSES:  1. Chest pain, resolved.  2. Hypertension, treated.  3. Known coronary artery disease.  4. History of lateral myocardial infarction in October 2003.  5. Status post cholecystectomy.  6. Bilateral total hip replacement.  7. Bilateral total knee replacement.  8. Hysterectomy.   HISTORY:  1. The patient is a 77 year old female with a known history of coronary     artery disease who experienced a lateral wall myocardial infarction in     October of 2003 receiving an express stent to the circumflex at that     time.  About one week prior to admission she began having left arm pain     and on the evening prior to admission she began having substernal chest     pain.  She was admitted to [facility] for further evaluation.  2. Her lab studies revealed negative cardiac isoenzymes.  Total cholesterol     152, triglycerides 102, HDL 54, LDL 78.  Otherwise tests reveal a TSH of     0.678, hemoglobin A1-C of 7.0, sodium 137, potassium 4.8, BUN 11,     creatinine 0.9.  3. Her EKG revealed a sinus rhythm, rate 78 with no acute ST/T wave changes.  4. During the patient's admission she underwent a stress Cardiolite, which     was apparently negative for ischemia, although the formal report is not     present in the record at this time.  For this reason, the patient was     discharged to home on 08/26/2002 in stable condition.   FOLLOW UP:  The office will contact the patient for an appointment.  She is  to call with further questions or concerns.  She is to remain  on a low-fat  diet.  She also needs to be educated on a diabetic diet with her hemoglobin  A1-C being somewhat elevated.  She will need to follow up with her primary  care physician.   DISCHARGE MEDICATIONS:  1. Sublingual nitroglycerin p.r.n. chest pain 30 mg a day.  2. She is to continue to use the same medications as prior to admission,     which include Crestor 10 mg a day.  3. Enalapril 20 mg a day.  4. Lopressor 50 mg one half tablet b.i.d.  5. As well as aspirin daily.          Guy Franco, P.A. [facility]  [facility], M.D.    Arlana Hove  D:  09/19/2002  T:  09/20/2002  Job:  [id number]   cc:   __________ __________, MD

## 2010-10-02 NOTE — H&P (Signed)
NAME:  [name], [name] NO.:  [id number]   MEDICAL RECORD NO.:  [id number]                   PATIENT TYPE:  INP   LOCATION:  1843                                 FACILITY:  [facility]   PHYSICIAN:  Jonelle Sidle, M.D. [facility]        DATE OF BIRTH:  [birth year]   DATE OF ADMISSION:  08/24/2002  DATE OF DISCHARGE:                                HISTORY & PHYSICAL   PRIMARY CARE PHYSICIAN:  Eric Form, Mena Goes   CARDIOLOGIST:  [place] Cardiologist,  Jesse Sans. Wall, M.D. [facility]   CHIEF COMPLAINT:  Chest discomfort.   HISTORY OF PRESENT ILLNESS:  The patient is a pleasant 77 year old woman  with history of hyperglycemia, hypertension and known coronary artery  disease, status post presentation with myocardial infarction in October of  2003.  Cardiac catheterization at that time revealed a 30% left anterior  descending stenosis, 40% proximal and 99% mid circumflex stenosis, and a  chronically diseased right coronary artery with a 99% stenosis.  The patient  underwent percutaneous coronary intervention with an Express stent to the  circumflex with good results.  Overall, she has been doing reasonably well  although it seems that she has a fairly decreased functional status  chronically and complains of fatigue.  There has been no specific changes in  this status until yesterday evening when she developed chest discomfort at  rest that began around 9 PM and persisted until 3 AM.  She had no dyspnea,  nausea or diaphoresis with this discomfort but some radiation to the left  arm.  She also complained of abdominal fullness preceding these symptoms.  She took some sublingual nitroglycerin with relief and ultimately went to  sleep.  She was apparently complaining of being more fatigued today than  normal and the family insisted on her presenting to the emergency department  today.  At present she is denying any chest discomfort.   ALLERGIES:  No known drug  allergies.   CURRENT MEDICATIONS:  1. Crestor 10 mg p.o. daily.  2. Benazepril 20 mg p.o. daily.  3. Lopressor 25 mg p.o. b.i.d.  4. Nitroglycerin sublingual p.r.n.   PAST MEDICAL HISTORY:  1. Myocardial infarction in October 2003 with subsequent Express stent     placement to the circumflex.  Residual anatomy as outlined above.     Ejection fraction was 60% at that time.  2. Type 2 diabetes mellitus, potentially based on history of hyperglycemia     in the past.  The patient is on no specific therapy at this time.  3. Hypertension.  4. Reported history of chronic bronchitis.  5. The patient reportedly had a recent Cardiolite in late March, results not     available.  6. Osteoarthritis.   SOCIAL HISTORY:  The patient lives in [place] with her son.  She is a  retired Lawyer.  She has a 50 plus pack year history of smoking and denies any  or drug use.   FAMILY HISTORY:  Noncontributory at present.   REVIEW OF SYMPTOMS:  As listed in history of present illness.  The patient  has had some depression symptoms following  the death of her son.  She has  constipation and other arthralgias as well.   PHYSICAL EXAMINATION:  VITAL SIGNS: Patient is afebrile. Heart rate 67 and  regular.  Respirations are 16.  Blood pressure initially 144/105.  GENERAL:  This is an obese elderly woman lying supine in no acute distress.  HEENT:  __________ normal.  Oropharynx is clear.  NECK:  Supple without elevated jugular venous pressure or carotid bruits. No  thyromegaly is noted.  LUNGS:  Clear to auscultation without wheezing or rhonchi.  CARDIAC:  Examination reveals regular rate and rhythm with an S4 but no S3  gallop or significant murmur.  There is no pericardial rub.  ABDOMEN:  Soft without obvious organomegaly or bruits.  EXTREMITIES:  Exhibit no cyanosis, clubbing or edema.  Peripheral pulses are  2+.  SKIN:  No ulcers or changes noted.  MUSCULOSKELETAL:  No kyphosis noted.   NEUROPSYCHIATRIC:  Patient is alert and oriented X3.   LABORATORY DATA:  Chest x-ray is reported as showing no acute  cardiopulmonary disease pattern.   A 12-lead electrocardiogram today shows normal sinus rhythm with an  occasional supraventricular ectopic beat.  Potential right atrial  enlargement is noted and there are nonspecific ST changes.   WBC's 10.4, hemoglobin 15.2, platelet count 367,000.  Sodium 137, potassium  4.8, BUN 12, creatinine 1.0, glucose 142,  hemoglobin A1C from October of  last year was 6.7.  Initial CK is 99, CK-MB 1.3, troponin-I 0.01.   IMPRESSION:  1. Recent onset chest discomfort at rest with negative troponin-I initially     and nonspecific electrocardiogram.  The patient has known coronary artery     disease, status post previous myocardial infarction in October of 2003     with Express stent placement to the circumflex.  She had a chronically     diseased right coronary artery at that time and other nonobstructive     disease with ejection fraction of 60%.  At present she is pain free.  2. History of hypertension.  3. Possible type 2 diabetes mellitus based on previously elevated hemoglobin     A1c and random glucose of 142.  The patient has been on no specific     therapy.  4. History of dyslipidemia on statin therapy.   PLAN:  1. Will admission the patient to telemetry and continue to cycle the cardiac     markers.  Otherwise continue outpatient medications including aspirin and     heparin.  2. If the patient rules out for myocardial infarction would anticipate a     Cardiolite study.  Unless there are major ischemic territories would     consider intensification of medical therapy as a first step, particularly     if the patient remains asymptomatic without recurrent chest discomfort.     Otherwise will need to consider repeat coronary angiography.  3. Check a fasting lipid profile.  4. May need to consider oral hypoglycemic agent.                                              Jonelle Sidle, M.D. [facility]   SGM/MEDQ  D:  08/24/2002  T:  08/24/2002  Job:  [id number]

## 2010-10-02 NOTE — Discharge Summary (Signed)
[place]. [facility]  Patient:    [name], [name]                      MRN: [id number] Adm. Date:  [id number] Disc. Date: [id number] Attending:  Faith Rice Rice Dictator:   Virginia Situ, [name] CC:         [name]. [name], M.D.   Discharge Summary  DISCHARGE DIAGNOSES: 1. Status post left total hip replacement. 2. Postoperative anemia. 3. Hypertension.  HISTORY OF PRESENT ILLNESS:  [name] is a 77 year old female with a history of hypertension and end-stage DJD of left hip, who elected to undergo left total hip replacement on March 01, 2000, by [name]. [name], M.D.  Postoperatively she was on Coumadin for DVT prophylaxis.  Currently the patient is weightbearing as tolerated and is at moderate assist for bed mobility, minimal assist for transfers, and minimal assist for ambulating 40 feet with standard walker.  PAST MEDICAL HISTORY:  Significant for hypertension, DJD, right total hip replacement, bilateral total knee replacements, hysterectomy, and cholecystectomy.  ALLERGIES:  No known drug allergies.  SOCIAL HISTORY:  The patient lives with family in a one-level home with two steps at entry.  She was independent prior to admission.  She smokes half of a pack per day plus uses snuff.  She does not use any alcohol.  HOSPITAL COURSE:  [name] was admitted to rehabilitation on March 04, 2000, for inpatient therapies to consist of PT and OT daily.  Post admission, the patient was started on a Nicoderm patch to help with tobacco withdrawal.  She also been maintained on Coumadin for DVT prophylaxis.  The PT at the time of discharge was therapeutic at 2.4.  The patient is discharged to continue Coumadin for one additional day needed to complete her DVT prophylaxis course.  Admission laboratory values showed her postoperative anemia to be stable with a hemoglobin of 10.5 and a hematocrit of 30.7.  Check of  electrolytes showed sodium 135, potassium 4.0, chloride 99, CO2 30, BUN 10, and creatinine 8.0.  LFTs were within normal limits, except for albumin being slightly low at 2.7.  The patients blood pressures have been controlled.  She has been afebrile during her stay.  Her p.o. intake has been variable.  The pain is reasonably controlled with the rare use of OxyContin.  She is to continue to use Tylenol post discharge.  The patient did have an episode of some shortness of breath on March 10, 2000.  A chest x-ray done essentially showed COPD and no acute changes.  The patient was instructed on incentive spirometry and also regarding smoking cessation.  During her stay in rehabilitation, [name] progressed to being at modified independent to supervision level for transfers and ambulation.  She requires set up for upper body care and minimal assist for lower body care.  Follow-up therapies were recommended, however, the family has declined home health PT and OT.  DISPOSITION:  The patient is discharged on March 11, 2000.  DISCHARGE MEDICATIONS: 1. Coumadin 3 mg per day x 3. 2. Lotensin 10 mg per day. 3. Trinsicon one p.o. b.i.d. 4. Nicotine patches 24 mg for two weeks to taper over four weeks from 14 mg to    7 mg a day. 5. ______ 25 mg per day. 6. Tylenol 650 mg p.o. q.4-6h. p.r.n. pain.  ACTIVITY:  24-hour supervision.  Total hip precautions.  To use walker.  DIET:  Regular.  WOUND  CARE:  Keep area clean and dry.  SPECIAL INSTRUCTIONS:  No alcohol.  No smoking.  No driving.  No aspirin or aspirin products while she is on Coumadin.  FOLLOW-UP:  The patient is to follow up with [name], M.D., for recheck in three to four weeks.  Follow up with [name], M.D., as needed. DD:  03/11/00 TD:  03/12/00 Job: [id number]

## 2010-10-02 NOTE — Discharge Summary (Signed)
NAME:  [name], [name]             ACCOUNT NO.:  [id number]   MEDICAL RECORD NO.:  [id number]          PATIENT TYPE:  INP   LOCATION:  3731                         FACILITY:  [facility]   PHYSICIAN:  Jesse Sans. Wall, MD, FACCDATE OF BIRTH:  06-03-1923   DATE OF ADMISSION:  05/22/2006  DATE OF DISCHARGE:  05/30/2006                               DISCHARGE SUMMARY   PRIMARY CARDIOLOGIST:  Dr. Juanito Doom.   CONSULTING CARDIOLOGIST:  Dr. Genene Churn. Sandria Manly.   PROCEDURES PERFORMED DURING HOSPITALIZATION:  Cardiac catheterization  dated May 27, 2006, per Dr. Rollene Rotunda.  A.  Patent circumflex stent, nonobstructive disease elsewhere in the  left system, high-grade disease in a small, diffusely diseased right  coronary system, unchanged from 2003.  Well-preserved ejection fraction.  Moderate aortic disease with patent iliac stent.   PRIMARY DIAGNOSES:  1. Non-ST elevated myocardial infarction.  2. __________   After her pressure has returned to baseline __________.   HISTORY OF PRESENT ILLNESS:  This is a __________   __________ status post cardiac catheterization.  1. Hypertension.   __________ American female with history of coronary artery disease  status post stent placement to the left __________.   SECONDARY DIAGNOSES:  1.   __________ 6, 2008, with complaints of shortness of breath with  increasing sharp left-sided chest pain.   __________ chronic headache with abnormal CT scan that is relieved at  rest.  The patient complained of shortness ___________   Tobacco abuse.  Diabetes.  Chronic obstructive pulmonary disease.  Pituitary surgery for further evaluation, rule out coronary artery  disease and give doses of Lasix secondary to probable __________ in  [facility].  Left sixth nerve palsy per Dr. Avie Echevaria.  No further complaints of  chest pain on arrival.  A.  Status post magnetic resonance imaging of the brain revealing  atherosclerotic disease involving middle  cerebral artery branches and  right posterior cerebral artery.  No large vessel occlusion or aneurysm  is identified.   HISTORY OF PRESENT ILLNESS:  This is an 77 year old [place] American  female from [place], [place], who was admitted on May 17, 2006, secondary to continued chest pain and progressive shortness of  breath, PND, over the last 2 weeks.  The patient has a history of  coronary artery disease as described above and had complained of left-  sided chest discomfort, relieved by rest, but had an episode of PND and  orthopnea prior to admission the night before.  The patient was started  on oxygen, and breathing status improved.  She was admitted for further  evaluation and treatment.   HOSPITAL COURSE:  The patient was seen and examined by Dr. Joaquim Lai,  cardiology fellow, in the emergency room.  The patient was started on  heparin.  Cardiac enzymes and labs were completed.  The patient's BNP  was found to be mildly elevated at 623.  She did receive IV Lasix in  anticipation of evidence of CHF, with good diuresis.  The patient was  found to have some elevated troponins on admission at 0.10.  Followup  cardiac troponins  were found to be 0.10 on the following day.  The  patient's CK was 48 and 40, respectively, with an MB of 1.1 and 1.1,  respectively.  Cardiac catheterization was recommended.   On May 24, 2006, the patient experienced complaints of severe  headache.  The patient had a CT scan completed of the head, and an  aneurysm could not be ruled out.  The patient did have followup MRIs as  described above.  Dr. Sandria Manly was consulted, Neurology, for further  evaluation of these headaches, and cardiac catheterization was placed on  hold until she was evaluated by him.  Dr. Imagene Gurney impression was as  stated above, with followup as an outpatient.  The patient was found  also to have recurrent left-sided pituitary tumor with headache and  visual changes.  The  patient will continue to be treated as an  outpatient by Dr. Sandria Manly in [facility].   The patient was also placed on Lopressor and became bradycardic.  This  was discontinued, and cardiac catheterization was completed.  The  cardiac catheterization was done on May 27, 2006, with results  described above.  The patient's blood pressure was found to be elevated  during procedure, and the patient's blood pressure medications were  adjusted during hospitalization.  The patient's Norvasc was increased to  10 mg p.o. daily, and she was to continue on her other medications prior  to hospitalization.  The patient was seen and examined by Dr. Juanito Doom  on day of discharge and found to be stable.   LABS ON DISCHARGE:  Hemoglobin 13.1, hematocrit 37.6, white blood cells  10.7, platelets 362.  PT 15.5, INR 1.2.  D-dimer 0.92.  Sodium 138,  potassium 3.9, chloride 102, CO2 of 30, glucose 110, BUN 19, creatinine  1.10.  LFTs were within normal limits.  Calcium was 8.5, magnesium 1.7.  BNP on admission 623.  Cholesterol 198, triglycerides 60, HDL 65, LDL  121.  EKG:  Atrial fibrillation with a ventricular rate of 128 beats per  minute with resolution of heart rate elevation with use of Toprol.  Heart rate returned to baseline of 51 beats per minute.   DISCHARGE VITAL SIGNS:  Blood pressure 118/68.  Pulse 50.  Respirations  18.  Temperature 98.2.   FOLLOWUP APPOINTMENTS AND PLANS:  1. The patient will be seen by Dr. Daleen Squibb in the [place] office in 2      weeks.  The office is to call the patient to make a followup      appointment, as it is closed for lunch.  2. The patient is to follow up with Dr. Sandria Manly at [facility].  The      patient is to call for appointment.  3. The patient has been given post-cardiac catheterization      instructions with particular references to evaluate puncture site     for evidence of bleeding, hematoma or infection.   DISCHARGE MEDICATIONS:  1. Norvasc 10  mg once a day.  (This is a new dose.)  2. Aspirin 325 once a day.  3. Lipitor 40 mg at bedtime.  4. Toprol XL 50 mg daily.  5. Nitroglycerin 0.4 mg as needed for chest pain.   ALLERGIES:  No known drug allergies.      [name]. Lyman Bishop, NP      Jesse Sans. Daleen Squibb, MD, [facility]  Electronically Signed    KML/MEDQ  D:  05/30/2006  T:  05/31/2006  Job:  [id number]  cc:   Atha Starks. Jillyn Hidden, FNP  Genene Churn. Love, M.D.

## 2010-10-02 NOTE — Discharge Summary (Signed)
NAME:  [name], [name]             ACCOUNT NO.:  [id number]   MEDICAL RECORD NO.:  [id number]           PATIENT TYPE:   LOCATION:                                 FACILITY:   PHYSICIAN:  Thomas C. Wall, MD, FACCDATE OF BIRTH:  [birth year]   DATE OF ADMISSION:  DATE OF DISCHARGE:                               DISCHARGE SUMMARY   PRIMARY CARDIOLOGIST:  Dr. Juanito Doom.   PROCEDURES PERFORMED DURING HOSPITALIZATION:  1. Cardiac catheterization, dated May 27, 2006, by Dr. Rollene Rotunda.      a.     Patent circumflex stent, nonobstructive disease elsewhere in       the system, high-grade disease and a small diffusely diseased       right coronary artery system, unchanged from 2003.  A well-       preserved ejection fraction.  Mild aortic disease with patent       iliac stent.   DISCHARGE DIAGNOSES:  1. Non-ST elevated myocardial infarction.  2. Known coronary artery disease, status post myocardial infarction,      status post stent placement to the left circumflex in October of      2003, status post stress Myoview negative for inducible ischemia,      July of 2006.   SECONDARY DIAGNOSES:  1. Diabetes.  2. Hypertension.  3. Chronic obstructive pulmonary disease.  4. Osteoarthritis.  5. Hyperlipidemia.  6. Chronic headache with abnormal CT.  7. History of tobacco abuse.  8. Left sixth nerve palsy per Dr. Avie Echevaria, no further complaints of      chest pain, status post magnetic resident imaging of the brain,      revealing atherosclerotic disease involving the middle cerebral      artery branches and right posterior cerebral artery.  No large      vessel occlusion or aneurysm identified.   HISTORY OF PRESENT ILLNESS:  This is an 77 year old [place] American  female from [place], [place], who was admitted on January 1,  secondary to continuous chest pain and progressive shortness of breath,  PND over the last 2 weeks.  The patient has a history of coronary  artery  disease as described above and had complained of left-sided chest  discomfort, relieved by rest with an episode of PND and orthopnea prior  to admission on the night before.  The patient was started on oxygen and  breathing status improved.  She was admitted for further evaluation and  treatment.   HOSPITAL COURSE:  The patient was seen and examined by Dr. Joaquim Lai,  cardiology fellow in the emergency room for Dr. Juanito Doom.  The patient  was started on heparin, cardiac enzymes and labs were completed.  The  patient's BNP was found to be mildly elevated at 623.  She received IV  Lasix in anticipation of evidence of CHF with good diuresis.  The  patient was found also to have elevated troponins on admission with a  troponin of 0.10.  Followup cardiac troponins were found to be 0.10 the  following day.  The patient CK  was 48 and 40 respectively with an MB of  1.1 and 1.1 respectively.  Cardiac catheterization was recommended.   On May 24, 2006, the patient experienced complaints of severe  headache.  The patient had a CT scan completed of the head and an  aneurysm could not be ruled.  The patient did have a followup MRI as  described above.  Dr. Sandria Manly was consulted.  Per neurology, for further  evaluation of these headaches and cardiac catheterization was placed on  hold until she was evaluated by Dr. Sandria Manly.  Dr. Imagene Gurney impression is  stated above was followed up as an outpatient.  The patient was also  found to have recurrent left-sided pituitary tumor with a headache and  visual changes.  The patient will continue to be treated as an  outpatient by Dr. Sandria Manly at [facility].   The patient was also placed on Lopressor and became bradycardic.  This  was discontinued and cardiac catheterization was completed.  The cardiac  catheterization was done on May 27, 2006 with results described.  The patient's blood pressure was found to be elevated during procedure  and the  patient's blood pressure medications were adjusted during  hospitalization.  The patient's Norvasc was increased to 10 mg p.o.  daily and she was also continued on her other medications prior to  hospitalization.  The patient was seen and examined by Dr. Juanito Doom on  day of discharge and found to be stable to go home.   LABS ON DISCHARGE:  Hemoglobin 13.1, hematocrit 37.6, white blood cells  10.7, platelets 362.  PT 15.5, INR 1.2.  D-dimer 0.92.  Sodium 138,  potassium 3.9, chloride 102, CO2 30, glucose 110, BUN 19, creatinine  1.10.  LFTs were within normal limits.  Calcium was 8.5, magnesium 1.7.  BNP, on admission, 623.  Cholesterol 198, triglycerides 60, HDL 55, LDL  121.  EKG:  Atrial fibrillation with ventricular rate of 128 beats per  minute with resolution of heart rate elevation with use of Toprol.  Heart rate returned to baseline of 51 beats per minute.   DISCHARGE VITAL SIGNS:  Blood pressure 118/68.  Pulse 50.  Respirations  18.  Temperature 98.2.   FOLLOWUP APPOINTMENTS AND PLANS:  1. The patient will be seen by Dr. Juanito Doom in the [place] office      in 2 weeks.  The office is to call the patient to make followup      appointment as it is closed for lunch.  2. The patient to follow up with Dr. Sandria Manly at [facility].  The      patient is to call for an appointment.  3. The patient has been given post cardiac catheterization      instructions with particular elements to evaluate puncture site of      right groin for evidence of bleeding, hematoma or infection.   DISCHARGE MEDICATIONS:  1. Norvasc 10 mg 1 p.o. daily (this is a new dose).  2. Aspirin 325 once a day.  3. Lipitor 40 mg at bedtime.  4. Toprol-XL 50 mg daily.  5. Nitroglycerin 0.4 mg as needed for chest pain.   ALLERGIES:  NO KNOWN DRUG ALLERGIES.   Time spent with the patient, to include physician time, 50 minutes.     [name]. Virginia Bishop, NP      [name]. Virginia Squibb, MD, [facility]  Electronically  Signed   KML/MEDQ  D:  11/28/2006  T:  11/28/2006  Job:  [id number] 

## 2010-10-02 NOTE — Consult Note (Signed)
NAME:  [name], [name]             ACCOUNT NO.:  [id number]   MEDICAL RECORD NO.:  [id number]          PATIENT TYPE:  INP   LOCATION:  3731                         FACILITY:  [facility]   PHYSICIAN:  Payton Doughty, M.D.      DATE OF BIRTH:  03-07-1924   DATE OF CONSULTATION:  05/26/2006  DATE OF DISCHARGE:  05/22/2006                                 CONSULTATION   CONSULTING PHYSICIANS:  Dr. Melbourne Abts and Dr. Juanito Doom.   PLACE OF CONSULT:  3700   HISTORY OF PRESENT ILLNESS:  This is an 77 year old right-handed black  female who was admitted for cardiac reasons, developed left-sided  headache and diplopia.  CT showed no bleed, MR showed no hemorrhage but  a pituitary tumor extending into the left cavernous sinus with some  clival erosion.   MEDICAL HISTORY:  Is remarkable for coronary artery disease.  She has  also had endoscopic pituitary resection in 2003.  She has diabetes and  hypertension.   ALLERGIES:  NONE.   She is currently off heparin and she is awaiting catheterization.   EXAM:  She is awake, alert, and oriented x3.  She has a left-sided  headache in the left sixth nerve palsy.  Her visual fields are full.  Her pupils equal, round, reactive to light.  In the left eye, medial,  superior, and inferior deflection is normal.  Cranial nerve V is intact.  Cranial nerve VII and VIII are intact.  Cranial nerves IX and X are  intact.  Cranial nerves XI and XII are intact.  Motor exam is intact  with no drift.  MRI as noted above.   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  Her headache is likely secondary to distortion  of the sinus dura.  There is no hemorrhage or thrombus evident to  suggest cavernous sinus thrombosis.  Somewhat the fifth nerve is  somewhat more vulnerable as it enters the sinus itself rather than being  contained in the lateral sinus dura as are the third, fourth, and fifth  nerves.  Operative intervention  on this would require an extensive skull  bridge procedure, including an orbitozygotomy and resection of the left  temporal floor.  I do not feel the patient is a candidate for this  procedure this extensive.  A far more benign option would be gamma knife  or stereotactic radiation can be done at [facility] or the [facility]  [place].  As the pathology on this tumor is benign, I think this offers  the best treatment option for this elderly and somewhat medically frail  female.           ______________________________  Payton Doughty, M.D.     MWR/MEDQ  D:  05/26/2006  T:  05/27/2006  Job:  [id number]

## 2010-10-02 NOTE — Discharge Summary (Signed)
[place]  Patient:    [name], [name]                      MRN: [id number] Adm. Date:  [id number] Disc. Date: [id number] Attending:  Faith Rogue T Dictator:   Prince Rome, P.A.                           Discharge Summary  ADMITTING DIAGNOSES: 1. 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 2. Hypertension. 3. Status post right total hip replacement. 4. Hysterectomy.  DISCHARGE DIAGNOSES: 1. 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 2. Hypertension. 3. Status post right total hip replacement. 4. Hysterectomy.  OPERATIONS:  Left total hip replacement.  BRIEF HISTORY:  A 77 year old black female, patient well known to [place], who is now having pain with every step with her left hip.  She is having night pain, unable to be comfortable.  She has been on Vioxx, has been of no help.  Her x-rays reveal end-stage bone-on-bone DJD of her left hip.  This patient is well-known to [place].  She has had other joints replaced for DJD.  Discussed treatment options with her, and one of those being total hip replacement, and that is what we have elected to proceed with.  PERTINENT LABORATORY AND X-RAY FINDINGS:  Normal sinus rhythm.  Pro times drawn serially - she was on low-dose Coumadin protocol.  Last testing hemoglobin is 9.8, hematocrit 28.3.  Urinalysis is essentially normal. Other indices are included in this chart.  COURSE IN THE HOSPITAL:  The patient was admitted postoperatively, placed on a variety of p.o. and I.M. analgesics for pain, to be weightbearing as tolerated with the help of physical therapy.  She would be on low-dose Coumadin protocol and back on her blood pressure medications.  On different dressing changes, her wound was benign at all times without sign of infection or irritation.  A rehabilitation consult was ordered, and she was decided to be a good candidate for rehab, and was taken  there.  CONDITION ON DISCHARGE:  Improved.  FOLLOW-UP:  Will see her back in our office once she is discharged, about one to two weeks.  Her staples will come out in one week.  She will remain on Coumadin four weeks postoperatively. DD:  03/28/00 TD:  03/28/00 Job: [id number]

## 2010-10-02 NOTE — Op Note (Signed)
[place]  Patient:    [name], [name]                      MRN: [id number] Proc. Date: 03/01/00 Adm. Date:  [id number] Attending:  Marcene Corning                           Operative Report  PREOPERATIVE DIAGNOSIS:  Left hip degenerative arthritis.  POSTOPERATIVE DIAGNOSIS:  Left hip degenerative arthritis.  OPERATION PERFORMED:  Left total hip replacement.  ANESTHESIA:  General.  ATTENDING SURGEON:  Lubertha Basque. Jerl Santos, M.D.  ASSISTANT: 1. Lindwood Qua, P.A. 2. Williemae Area, RN.  INDICATIONS FOR PROCEDURE:  The patient is a 77 year old woman who is status post bilateral knee and right hip replacements.  At this point she is limited by left hip and groin pain.  This has persisted despite oral anti-inflammatories and the use of a cane.  At this point she was offered hip replacement surgery.  The procedure was discussed with the patient and informed operative consent was obtained after discussion of possible complications of reaction to anesthesia, infection, DVT, PE, dislocation and death.  DESCRIPTION OF PROCEDURE:  The patient was taken to an operating suite where general anesthetic was applied without difficulty.  She was then positioned in the lateral decubitus position with the left hip up.   All bony prominences were appropriately padded and an axillary roll was applied.  She was then prepped and draped in normal sterile fashion.  After administration of preop IV antibiotics, the posterior approach was taken to the left hip.  All appropriate anti-infective measures were used including the aforementioned perioperative antibiotic, closed hooded exhaust systems for each member of the surgical team, and a Betadine impregnated drape.  Dissection was carried down through adipose tissue to expose the fascia lata and gluteus maximus fascia. These were incised longitudinally to expose the hip.  The short external rotators were tagged and  reflected and the capsule was excised.  She had some large osteophytes around the acetabulum which were removed.  The acetabulum was exposed.  She had severe degenerative change across the entire femoral head and most of the acetabulum.  A femoral neck cut was made about 1 cm above the lesser trochanter.  With the acetabulum exposed, reaming was taken from the smallest reamer up to size 50.  This seemed to bottom out well and she had good bleeding bone throughout most of the acetabulum.  She had good bone quality.  An osteonics Secure-Fit 550 acetabulum with no holes was placed. This was placed in appropriate anteversion and tilt.  A central hole filler was placed.  The cross fire 10 degree liner was then applied.  Attention was then turned towards the femur.  This was exposed properly and reamed.  We then reamed up to size 8 but only passed a size cement 7, broach and it was felt that she would not broach any higher.  A trial reduction was then performed with these components.  We did this with both the plus 0 and plus 5 neck and both seemed stable to extension with external rotation and flexion with internal rotation.  The trial component was removed.  A distal cement spacer was placed and the canal was irrigated with the pulsatile lavage.  At this point the cement was mixed including the antibiotic.  This was pressurized into the femur.  The ODS Osteonics cement 7 femoral component  was then applied.  Pressure was held on this component until the cement had hardened completely.  Excess cement was trimmed.  The stem was placed in about 20 degrees of anteversion.  The plus 0 neck was placed on a dry stem.  This was a metal ball.  The hip was then reduced and again was found to be stable.  The wound was thoroughly irrigated followed by reapproximation of short external rotators to the greater trochanter with nonabsorbable suture.  The fascia lata and gluteus maximus fascia were reapproximated  with #1 Vicryl in interrupted fashion.  The subcutaneous tissues reapproximated in two layers of Vicryl followed by skin closure with staples.  Adaptic was placed on the wound followed by dry gauze and tape.  Estimated blood loss and intraoperative fluids can be obtained from Anesthesia records.  DISPOSITION:  The patient was extubated in the operating room and taken to the recovery room in stable condition.  Plans were for her to be admitted to the orthopedic surgery service for appropriate postoperative care to include perioperative antibiotics and Coumadin for deep vein thrombosis prophylaxis. DD:  03/01/00 TD:  03/01/00 Job: [id number]

## 2010-10-02 NOTE — H&P (Signed)
NAME:  [name], [name]                         ACCOUNT NO.:  [id number]   MEDICAL RECORD NO.:  [id number]                   PATIENT TYPE:  EMS   LOCATION:  MAJO                                 FACILITY:  [facility]   PHYSICIAN:  Thomas C. Wall, M.D. [facility]            DATE OF BIRTH:  February 23, 1924   DATE OF ADMISSION:  02/26/2002  DATE OF DISCHARGE:                                HISTORY & PHYSICAL   CHIEF COMPLAINT:  Chest tightness and hurting about 2 hours ago.   HISTORY OF PRESENT ILLNESS:  The patient is a widowed 77 year old African-  American female with a history of hypertension and tobacco use who came in  with the acute onset of chest pain around 8 p.m. this evening.  EMS was  called by her sister.  EKG shows ST elevation of 1-2 mm in II, III, and aVF,  with ST segment depression 1 in aVL and lead II.  She is still having chest  pain despite IV nitroglycerin, heparin, and aspirin.  I gave her 2 mg of  morphine.   CARDIAC RISK FACTORS:  Her cardiac risk factors include age, hypertension,  and tobacco.  She has no previous cardiac or cerebrovascular history.   ALLERGIES:  She has no known drug allergies.   CURRENT MEDICATIONS:  Current medicines include Lotensin 10 mg a day.   SOCIAL HISTORY:  She smokes about a pack of cigarettes a day.  She does not  drink.   PREVIOUS ILLNESSES AND SURGERIES:  She has had a cholecystectomy, bilateral  hip and bilateral knee replacements, and a right brain tumor resected last  year at [facility].  She has had no bleeding disorders.  She has had no  previous stroke.   FAMILY HISTORY:  Noncontributory.   REVIEW OF SYSTEMS:  Really noncontributory.  She denies any melena,  hematochezia, or bleeding disorder.   EXAMINATION:  Her exam today shows her to be in acute distress, arriving in  pain.  VITAL SIGNS:  Blood pressure:  135/75.  Pulse:  79 and sinus rhythm.  O2  sats:  96% on 2L.  She is afebrile.  Respiratory rate:  20 and labored.  SKIN:  Warm and dry.  HEENT:  Exam shows pupils are equally responsive to light and accommodation.  Extraocular movements are intact.  Facial symmetry is normal.  Carotid  upstrokes are equal but with a right carotid bruit.  Left carotid upstroke  is normal without bruits.  There is no JVD.  Thyroid is not enlarged.  She  has a bull neck.  Trachea is midline.  LUNGS:  Clear to auscultation and percussion.  HEART:  Reveals regular rate and rhythm without murmur or gallop or rub.  ABDOMINAL EXAM:  Protuberant with good bowel sounds.  No organomegaly can be  assessed.  EXTREMITIES:  Femorals are 3+/4+ bilaterally.  Dorsalis pedis pulses are  1+/4+ bilaterally.  The posterior tibials are  1+ to 2+/4+ bilaterally.  NEURO:  Discretion intact.   LABORATORY DATA:  EKG shows an acute inferior MI with possible posterior  extension.  Creatinine is 1.0, potassium 3.5, glucose 156 which is  nonfasting, hemoglobin 15 on I-STAT.    ASSESSMENT:  1. Acute inferior myocardial infarction with possible posterior wall     extension.  2. Hypertension.  3. Tobacco use.  4. Obesity.  5. Right carotid bruit.  6. Early onset diabetes or glucose intolerance.  7. Hypokalemia.   PLAN:  1. Acute cardiac catheterization with PCI.  [facility]     nurse called.  2. Beta blocker, aspirin, heparin, nitroglycerin, and morphine for relief of     pain.  3. Cardiac enzymes before TSH.  4. Carotid Dopplers.  5. D.C. tobacco.  6. Fasting blood sugars, hemoglobin A1c.  7. Increase potassium level.   Indications of risks and potential benefits of the cath have been discussed  with the patient and her sister.  She agrees to proceed.                                               Thomas C. Daleen Squibb, M.D. [facility]    TCW/MEDQ  D:  02/26/2002  T:  02/27/2002  Job:  [id number]   cc:   Marlise Eves, PA  [facility]

## 2010-10-02 NOTE — Assessment & Plan Note (Signed)
[facility] OFFICE NOTE   NAME:WILSONTanish, Sinkler                    MRN:          [id number]  DATE:06/14/2006                            DOB:          [birth year]    Mrs. Route returns today after being admitted with unstable angina to  [facility] on May 22, 2006.  Her troponin was slightly  positive at 0.1.   She ended up having a heart catheterization with her known coronary  disease.  She had a patent circumflex stent, nonobstructive disease in  her left system otherwise and a high grade disease and a small diffuse  disease, right coronary artery.  She had good left ventricular function.  She has patent iliac stent as well.   During her stay, she complained of headache and had some confusion.  She  underwent neurological evaluation which showed recurrence of a pituitary  tumor.  Dr. Melbourne Abts saw her for neurology and has referred her to [facility] for further therapy.  She is  seeing a Dr. Angelyn Punt.   She has been given a prescription for Synthroid and prednisone for  pituitary insufficiency.   Her other meds are Toprol XL 50 a day, Norvasc 10 mg a day, benazepril  40 mg a day, metformin 500 b.i.d., aspirin 325 a day, Lipitor 40 mg a  day.   Her blood pressure today is 130/70, her pulse is 56, and she is sinus  brady.  Her EKG, otherwise, is normal.  Her weight is 176, she is in no acute distress.  SKIN:  Warm and dry.  HEENT:  Normocephalic, atraumatic.  PERRLA.  Extraocular movements  intact.  Carotid upstrokes are equal bilaterally without bruits.  There  is no JVD.  Thyroid is not enlarged, trachea is midline.  LUNGS:  Clear.  HEART:  Reveals a normal S1, S2, with a soft systolic murmur on the left  sternal border.  ABDOMINAL EXAM:  Soft.  EXTREMITIES:  Reveal no edema, pulses are present.   I have had a nice chat with Mrs. Alkins and her family  today.  I have  written a note to clear her for any type of surgery she needs at [facility].  I will plan on seeing her back again in 6 months.     Thomas C. Daleen Squibb, MD, [facility]  Electronically Signed   TCW/MedQ  DD: 06/14/2006  DT: 06/14/2006  Job #: [id number]   cc:   Billie D. Bean, FNP

## 2010-10-02 NOTE — Cardiovascular Report (Signed)
NAME:  [name], [name]             ACCOUNT NO.:  [id number]   MEDICAL RECORD NO.:  [id number]          PATIENT TYPE:  INP   LOCATION:  3731                         FACILITY:  [facility]   PHYSICIAN:  Rollene Rotunda, MD, FACCDATE OF BIRTH:  Sep 28, 1923   DATE OF PROCEDURE:  05/27/2006  DATE OF DISCHARGE:                            CARDIAC CATHETERIZATION   PRIMARY CARE PHYSICIAN:  Billie D. Bean, FNP   REASON FOR PROCEDURE:  Evaluate patient with chest pain suggestive of  unstable angina (411.1).  She has history of a stent to the circumflex  vessel.   PROCEDURE:  Cardiac catheterization.   DESCRIPTION OF PROCEDURE:  Left heart catheterization performed via the  right femoral artery.  The artery was cannulated using anterior wall  puncture.  A #6-French arterial sheath was inserted via the modified  Seldinger technique.  It was clear from fluoroscopy the patient had a  right iliac stent.  All catheter exchanges were achieved using a long  wire given her vascular disease.  We had no difficulty with these.  Because of the peripheral vascular disease and some leg pain during the  case and also her very elevated pressures, I did do an abdominal  angiogram.  The patient left the lab in stable addition.   RESULTS:  1. Hemodynamics:  LV 213/15, AO 213/67.  2. Coronaries:  The left main was short with a distal 40% stenosis.      The LAD was large wrapping the apex.  There was proximal 25%      stenosis.  First diagonal was moderate size and normal.  Circumflex      and the AV groove had long calcified lesion.  There was 30%      stenosis.  There is a mid stent which was widely patent.  The mid      obtuse marginal was large with ostial 25% stenosis.  Second obtuse      marginal was moderate sized and free of high-grade disease.  The      right coronary artery was a moderate to small vessel.  There was      ostial 99% stenosis followed by severe diffuse mid disease.  There      was TIMI I  flow of the left-to-right collaterals.  This was      unchanged from the catheterization in 2003.  Aortogram was obtained      demonstrating patent renal's with a right ostial 50% lesion.  There      were diffuse nonobstructive irregularities in the aorta before the      bifurcation.  The right iliac had a patent stent.  3. Left ventriculogram:  Left ventriculogram was obtained in the RAO      projection.  The EF was 65% with normal wall motion.   CONCLUSIONS:  1. Patent circumflex stent.  2. Nonobstructive disease elsewhere in the left system.  3. High-grade disease and a small diffusely diseased right coronary      system unchanged from 2003.  4. Well-preserved ejection fraction.  5. Moderate aortic disease with a with patent iliac stent.   PLAN:  Patient with continue to have aggressive secondary risk  reduction.  She was quite hypertensive during the case.  We will make  sure to pull the sheath only after her pressure has returned to  baseline.      Rollene Rotunda, MD, [facility]  Electronically Signed     JH/MEDQ  D:  05/27/2006  T:  05/28/2006  Job:  [id number]   cc:   Willaim Sheng D. Bean, FNP

## 2010-10-02 NOTE — Discharge Summary (Signed)
NAME:  [name], [name]                         ACCOUNT NO.:  [id number]   MEDICAL RECORD NO.:  [id number]                   PATIENT TYPE:  INP   LOCATION:  4715                                 FACILITY:  [facility]   PHYSICIAN:  Jesse Sans. Wall, M.D. [facility]            DATE OF BIRTH:  10/14/1923   DATE OF ADMISSION:  02/26/2002  DATE OF DISCHARGE:  03/02/2002                                 DISCHARGE SUMMARY   DISCHARGE DIAGNOSES:  1. Acute inferior myocardial infarction.  2. Hypertension.  3. Tobacco use.  4. Obesity.  5. Hyperglycemia.   HISTORY OF PRESENT ILLNESS:  The patient is a 77 year old African American  female with no prior history of coronary artery disease.  She presented to  the [facility] Room on February 26, 2002,  after approximately two hours of chest pain.  The patient was seen and  admitted by [facility] C. Wall, M.D.  Dr. Daleen Squibb noted that the patient was having  an acute inferior myocardial infarction.  He arranged for urgent cardiac  catheterization.   HOSPITAL COURSE:  The patient was taken to the catheterization lab by Charlies Constable, M.D.  Catheterization Results:  1.  Left main coronary artery:  No  significant disease.  2.  Left anterior descending:  Proximal 30%.  3.  Left  circumflex:  40% proximal followed by 99% mid vessel stenosis.  4.  Right  coronary artery:  Chronic occlusion with a 99% lesion (chronic) in mid  vessel with subsequent TIMI-1 flow.  5.  Left ventriculogram:  Ejection  fraction 60% with inferobasilar and inferoposterior hypokinesis.  Dr. Juanda Chance  then performed angioplasty and stenting with an Express 2 stent to the  lesion in the circumflex.  The 99% lesion was reduced down to 0% with  resulting TIMI-3 flow.   The next day, the patient was doing well, had no further chest pain, and her  groin was stable.  Doppler ultrasounds of the carotid arteries revealed no  significant ICA stenosis and vertebral artery flow  was antegrade  bilaterally.   Over the next couple of days, the patient continued to do well, had no  further chest pain, and continued to ambulate well with cardiac  rehabilitation.   On March 02, 2002, the patient was seen by Maisie Fus C. Wall, M.D.  The  patient felt quite well and ambulated independently.  He felt she was ready  for discharge.   DISCHARGE MEDICATIONS:  1. Enteric-coated aspirin 325 mg q.d.  2. Plavix 75 mg q.d. for one month.  3. Crestor 10 mg q.d.  4. Wellbutrin SR 150 mg q.d. for three days followed by b.i.d.  5. Toprol XL 50 mg q.d.  6. Lotensin 10 mg q.d.  7. K-Dur 20 mEq q.d.  8. Sublingual nitroglycerin as needed.   LABORATORY DATA:  Sodium 139, potassium 3.6, chloride 103, CO2 29, BUN 15,  creatinine 0.8,  glucose 134.  White count 12.1, hemoglobin 11.3, hematocrit  32.7, platelets 365.  Hemoglobin A1C 6.7.  Total cholesterol 188,  triglycerides 138, HDL 52, LDL 108, total cholesterol to HDL ratio 3.6, VLDL  28.  TSH 0.495.   ACTIVITY:  The patient is to avoid driving, heavy lifting, and tub baths for  two days.   DIET:  Follow a low-fat diabetic diet.   WOUND CARE:  She is to watch the catheterization site for any pain,  bleeding, or swelling and call the [place] offices.   FOLLOW-UP:  She is to follow up with Myer Peer, N.P., at the [facility]  office as scheduled.  She is to follow up with a P.A. visit in the office on  March 15, 2002, at 10:15 a.m.  A follow-up appointment with Maisie Fus C.  Wall, M.D., will be arranged at that time.     Annett Fabian, P.A. [facility]                  Thomas C. Daleen Squibb, M.D. [facility]    CKM/MEDQ  D:  03/02/2002  T:  03/04/2002  Job:  [id number]   cc:   Myer Peer, N.P.   Thomas C. Wall, M.D. [facility]

## 2010-10-02 NOTE — Patient Instructions (Signed)
Good to see you, [name]. Please call me in 3-4 weeks with an update of how you are feeling and sleeping.

## 2010-10-02 NOTE — Consult Note (Signed)
NAME:  [name], [name]             ACCOUNT NO.:  [id number]   MEDICAL RECORD NO.:  [id number]          PATIENT TYPE:  INP   LOCATION:  2920                         FACILITY:  [facility]   PHYSICIAN:  Genene Churn. Love, M.D.    DATE OF BIRTH:  [birth year]   DATE OF CONSULTATION:  05/25/2006  DATE OF DISCHARGE:                                 CONSULTATION   NEUROLOGICAL CONSULTATION:   This 77 year old right-handed black widowed female from [place],  [place], was admitted May 22, 2006, with shortness of breath.  She is seen in consultation for evaluation of left-sided headache and  abnormal CT scan of the brain.   HISTORY OF PRESENT ILLNESS:  [name] has a history of a pituitary  tumor removed at the [place] [facility] [place] at [facility]  approximately 10 years ago.  She was seen by an eye doctor, who noted an  abnormality and referred her to [facility].  The patient states that  she had no clinical symptoms at that time.  She has otherwise been in  good health except for atherosclerotic vascular disease.  She has a  known history of diabetes mellitus, hypertension, chronic obstructive  pulmonary disease, osteoarthritis, and hyperlipidemia.  She has a  history of a myocardial infarction in 2003 and is status post stent  placement to left circumflex in October 1992.  She was in the hospital  for shortness of breath on heparin and yesterday, May 24, 2006, noted  the onset of bifrontal headache, worse on the left than the right,  associated with double vision.  A CT scan of the brain showed a mass in  the intracavernous portion of the sella region localized mostly to the  left.  There was no definite hemorrhage into it.  The heparin was  discontinued.  She has not had any swallowing problems, slurred speech,  blackout spells or seizures.   PAST MEDICAL HISTORY:  1. Hypertension.  2. Chronic cigarette use.  3. Right carotid bruit.  4. Inferior myocardial infarction  October 2003.  5. Diabetes mellitus.  6. Chronic obstructive pulmonary disease.  7. Status post right hip replacement.  8. Status post left hip replacement.  9. Status post right and left knee replacements.  10.Pituitary surgery about 10 years ago at the [place] [facility] [place] at [facility].   She continues to smoke cigarettes.   Her medications at the time of admission included benazepril,  hydrochlorothiazide and metformin and aspirin.   PHYSICAL EXAMINATION:  GENERAL:  Examination revealed a well-developed  female.  VITAL SIGNS:  Blood pressure in the right and left arm 140/80, heart  rate was 70.  NECK:  There was a right carotid bruit and a right supraclavicular bruit  heard.  The neck flexion/extension maneuvers were unremarkable.  NEUROLOGIC:  Mental status:  She is alert and oriented x3.  Followed  one, two and three-step commands.  Cranial nerve examination revealed  acuity of 20/40 bilaterally.  Both discs were seen and flat.  The  extraocular movements revealed a left sixth nerve palsy.  She also  had a  left Horner.  Corneals were present.  Facial sensation was equal.  There  was no seventh.  Hearing was decreased.  Air conduction was greater than  bone conduction.  Tongue was midline.  The uvula was midline.  Gags were  present.  Motor examination revealed good strength in the upper  extremities.  She had decreased two-point discrimination in her hands,  decreased pinprick in her lower extremities.  She had absent deep tendon  reflexes in the lower extremities.  Plantar response was upgoing on the  left and downgoing on the right.   IMPRESSION:  1. Headache,cod 784..0.  2. 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  3. History of pituitary tumor, code 227.3.  4. Myocardial infarctions in the past, code 429.2.  5. Chronic obstructive pulmonary disease, code 496.  6. History of hypertension, code 796.2.  7. Diabetes mellitus , code 250.60, with diabetic  neuropathy, code      357.2.  8. Hyperlipidemia, code 272.4.   Plan at this time is to get an MRI study of the brain and pituitary  region with and without the use of gadolinium.           ______________________________  Genene Churn. Sandria Manly, M.D.     JML/MEDQ  D:  05/25/2006  T:  05/25/2006  Job:  [id number]

## 2010-10-02 NOTE — Cardiovascular Report (Signed)
NAME:  [name], [name]                         ACCOUNT NO.:  [id number]   MEDICAL RECORD NO.:  [id number]                   PATIENT TYPE:  INP   LOCATION:  1827                                 FACILITY:  [facility]   PHYSICIAN:  Charlies Constable, MD [facility]                DATE OF BIRTH:  1923/08/24   DATE OF PROCEDURE:  02/26/2002  DATE OF DISCHARGE:                              CARDIAC CATHETERIZATION   PROCEDURE PERFORMED:  Cardiac catheterization and percutaneous coronary  intervention.   CLINICAL HISTORY:  The patient is 77 years old and had no prior history of  known heart disease, although she does smoke and she does have hypertension.  She had the onset of chest pain at 2030 hours and arrived at [facility]  Emergency Room at 2146 hours with ECG changes of an acute inferior  myocardial infarction. She was seen by Dr. Daleen Squibb and given heparin and  aspirin and transported to the catheterization lab for intervention.   DESCRIPTION OF PROCEDURE:  The procedure was performed via the right femoral  artery using an arterial sheath and 6 French preformed coronary catheters.  A front wall arterial puncture was performed and Omnipaque contrast was  used.  At the completion of the diagnostic study we made a decision to  proceed with intervention on the circumflex artery.   The patient was enrolled in the [name] trial and was randomized to thrombectomy  with the Possis AngioJet device.  She was given additional heparin to  prolong the ACT to greater than 200 seconds and she was given double bolus  Integrilin in infusion.  She also was given 300 mg of Plavix.  We used a 3.0  Voda 7 [place] guiding catheter with side holes and a Graphix PT wire.  We  crossed the lesion in the mid circumflex artery with the wire without too  much difficulty.  We passed a Possis AngioJet to the lesion and performed  three runs back and forth across the lesion. Repeat diagnostic studies were  then performed through the  guiding catheter. This resulted in improvement in  the stenosis from 99% to 90%.  The flow was TIMI-3 before and after the  AngioJet.  We then stented the lesion with a 2.5 x 16 mm  Express deploying  this with one inflation of 10 atmospheres for 30 seconds.  Repeat diagnostic  studies were then performed through the guiding catheter. We deployed the  stent across a marginal branch.   A temporary transvenous pacemaker was placed prior to use of AngioJet and  intracoronary nitroglycerin was given prior to the use of the AngioJet.  The  patient tolerated the procedure well and left the laboratory in satisfactory  condition.   RESULTS:  The left ventricular pressure was 179/29, and the aortic pressure  was 179/106.   The left main coronary artery:  The left main coronary artery was short but  was  free of significant disease.   Left anterior descending:  The left anterior descending artery was irregular  and there was a 30% ostial stenosis.  The LAD gave rise to four diagonal  branches and three septal perforators.  These and the LAD apart from the  ostium were free of significant disease.   Circumflex artery:  The circumflex artery gave rise to a moderate sized  marginal branch and an AV branch which terminated in a moderately large  posterolateral branch.  There was moderate calcification in the proximal to  mid vessel. There was 40% narrowing in the proximal vessel.  There was 99%  stenosis in the mid vessel just before the takeoff of the marginal branch.   Right coronary artery:  The right coronary artery was a small to moderate  sized vessel that had a 90% ostial stenosis and then severe disease in its  midportion with 99% stenosis with TIMI-1 flow distally.  The distal vessel  filled via collaterals from the left coronary system which filled a small  posterolateral and small posterior descending branch.   LEFT VENTRICULOGRAPHY:  The left ventriculogram performed in the RAO   projection showed hypokinesis of the inferobasal segment.  The rest of the  wall motion appeared to move well and there appeared to be LVH.  The  estimated ejection fraction was 60%.   LEFT VENTRICULOGRAM:  The left ventriculogram performed in the LAO  projection showed hypokinesis of the inferolateral wall.  The rest of the  wall motion was good.   Following thrombectomy with the AngioJet and stenting of the mid circumflex  lesion, the stenosis improved from 99% to 0%.  The flow was TIMI-3 before  and after intervention. The marginal side branch got pinched moderately, but  there was no reduction in flow.   CONCLUSION:  1. Acute diaphragmatic wall myocardial infarction with 30% narrowing in the     ostium of the left anterior descending, 40% proximal and 99% mid stenosis     in the circumflex artery, and 90% ostial and functional total occlusion     of the mid right coronary artery with inferobasal and inferior posterior     wall hypokinesis.  2. Successful thrombectomy with the Possis AngioJet and successful stenting     of the mid circumflex stenosis with improvement in percent diameter     narrowing from 99% to 0%.   DISPOSITION:  The patient was returned to the postangioplasty unit for  further observation. The patient had the onset of chest pain at 2030 hours  and arrived at [facility] Emergency Room at 2146 hours.  The AngioJet  crossed the lesion at 2347 hours.  This gave a door-to-balloon time of 2  hours and 1 minute, and a reperfusion of 3 hours and 17 minutes.                                                    Charlies Constable, MD [facility]    BB/MEDQ  D:  02/27/2002  T:  02/27/2002  Job:  [id number]   cc:   Myer Peer, M.D.  [employer] C. Wall, M.D. [facility]   Cardiopulmonary Laboratory

## 2010-10-02 NOTE — H&P (Signed)
NAME:  [name], [name]             ACCOUNT NO.:  [id number]   MEDICAL RECORD NO.:  [id number]          PATIENT TYPE:  EMS   LOCATION:  ED                           FACILITY:  [facility]   PHYSICIAN:  Rod Holler, MD     DATE OF BIRTH:  Jul 09, 1923   DATE OF ADMISSION:  05/22/2006  DATE OF DISCHARGE:                              HISTORY & PHYSICAL   CHIEF COMPLAINT:  Shortness of breath.   HISTORY OF PRESENT ILLNESS:  [name] is a very pleasant 77 year old  female with a history of coronary artery disease status post stent  placement to the left circumflex in 2003, who presented to the emergency  department with complaints of shortness of breath.  Over the past couple  of weeks, she has had a history of sharp left-sided chest discomfort  that occurs with exertion, lasts for a couple of minutes, and is  relieved by rest.  She also has complaints of shortness of breath with  mild exertion that is worse over the past couple of weeks.  The reason  she presented to the emergency department was because she had shortness  of breath while lying flat last night.  Her shortness of breath improved  with oxygen.  She has no chest pain at current and does not remember the  last time she had chest discomfort.  She has no complaints of lower  extremity edema, syncope, or  presyncope.   PAST MEDICAL HISTORY:  1. Coronary artery disease status post MI, status post stent placement      to the left circumflex in October 2003.  Stress Myoview negative      for inducible ischemia in July 2006.  2. Diabetes mellitus.  3. Hypertension.  4. COPD.  5. Osteoarthritis.  6. Hyperlipidemia.   MEDICATIONS:  1. Aspirin 81 mg p.o. daily.  2. Benazepril 40 mg p.o. daily.  3. Hydrochlorothiazide 12.5 mg p.o. daily.  4. Metformin 500 mg p.o. daily.   ALLERGIES:  No known drug allergies.   SOCIAL HISTORY:  The patient lives with her son, smokes 1/2 pack per  day.   FAMILY HISTORY:  Mother with history of  coronary artery disease, not  premature.   PHYSICAL EXAMINATION:  VITAL SIGNS: Temperature 97.9, heart rate 78,  respiratory rate 18, oxygen saturation 100%, blood pressure 117/65.  GENERAL:  Elderly female, alert and oriented x3, no apparent distress.  HEENT:  Atraumatic and normocephalic. Pupils equal, round, and reactive  to light.  Extraocular movements intact.  Oropharynx clear.  NECK: Supple.  No adenopathy, no JVD.  CHEST: Coarse breath sounds throughout, equal bilaterally.  CORONARY: Regular rhythm, normal rate.  Normal S1, S2, 2/6 holosystolic  murmur, 2+ peripheral pulses.  ABDOMEN: Soft, nontender, nondistended.  Active bowel sounds.  EXTREMITIES:  Pedal edema. No clubbing or cyanosis.  NEUROLOGIC: No focal deficits.   EKG shows normal sinus rhythm with nonspecific ST changes.   LABORATORY DATA:  BNP 623.  White blood cell count 8.1, hematocrit 38.7,  platelet count 418.  Sodium 139, potassium 4.7, chloride 103, bicarb 31,  BUN 16, creatinine 1, glucose  98.  D-dimer 0.9.  CK-MB 6.6, 10.4, 9.2.  Troponin 0.33, 0.58, 0.82.  Myoglobin 174, 206, 186.   Chest x-ray consistent with COPD.   CT scan of the chest showed plaque in the upper abdominal aorta,  no  pulmonary embolus.   IMPRESSION AND PLAN:  An 77 year old female who presents with shortness  of breath, non-ST-elevation myocardial infarction.   PLAN:  1. Cardiovascular:  Will transfer to [facility]; admit to a step-down      unit bed.  Daily aspirin, Lipitor, Benazepril, and b.i.d.      Lopressor.  Will place her on heparin bolus and drip per the      pharmacy protocol and consider Integrilin if cardiac enzymes      continue to rise or if the patient has recurrent chest pain.      Transthoracic echocardiogram in the morning. The patient might need      cardiac catheterization.  EKG, lipid panel in the morning.      Sublingual nitroglycerin p.r.n.  2. Endocrine:  Sliding scale insulin, hold metformin, hemoglobin  A1c      in the morning.  3. Fluid, electrolytes, and nutrition:  Diabetic diet, n.p.o. after      midnight.  4. Stop IV fluids.  5. Give dose of Lasix 40 mg IV x1.  6. Morning labs: CBC, CMP, magnesium level, PT, PTT, INR.      Rod Holler, MD  Electronically Signed     TRK/MEDQ  D:  05/22/2006  T:  05/22/2006  Job:  [id number]

## 2010-10-02 NOTE — Assessment & Plan Note (Signed)
Deteriorated. >25 min spent with face to face with patient counseling and coordinating care Will start Remeron 7.5 mg nightly to hopefully help with her mood, insomnia and appetite. Pt to call in 3-4 weeks.

## 2010-10-08 NOTE — H&P (Signed)
NAME:  [name], [name]             ACCOUNT NO.:  [id number]  MEDICAL RECORD NO.:  [id number]           PATIENT TYPE:  I  LOCATION:  2004                         FACILITY:  [facility]  PHYSICIAN:  Doylene Canning. Ladona Ridgel, MD    DATE OF BIRTH:  1923/09/27  DATE OF ADMISSION:  09/20/2010 DATE OF DISCHARGE:                             HISTORY & PHYSICAL   ADMITTING DIAGNOSES:  Chest pain associated with malaise and fatigue.  HISTORY OF PRESENT ILLNESS:  The patient is a very pleasant elderly woman with ongoing tobacco abuse, coronary artery disease, peripheral vascular disease, hypertension, dyslipidemia, and tobacco abuse.  She also has severe arthritis.  The patient was in her usual state of health until earlier today, which she began to feel weak and tired.  She had very vague chest discomfort.  She called 911 and an EKG was obtained, for which a Code STEMI was called.  Review of the EKG demonstrates upsloping 1.5-mm ST elevation lead in V3 and V4 consistent with J-point elevation.  She was atrially paced.  She is admitted for additional evaluation.  She currently has no chest pain.  She does feel weak and tired.  She denies shortness of breath, nausea, or vomiting.  She has not had syncope.  The patient denies palpitations.  Her past medical history is notable for multiple medical problems including longstanding hypertension.  She has a history of paroxysmal AFib.  She has a history of abdominal pain, was hospitalized back in October with this.  She has a history of chronic headache.  She has a history of pituitary tumor, status post gamma knife treatment at [facility]  She has a history of peripheral vascular disease with a stent to the right iliac artery.  Cardiac history is notable for a preserved LV function with normal left ventricular systolic function. She has a history of bradycardia and underwent pacemaker insertion years ago.  She has diffuse coronary artery disease  with catheterization in 2008.  At that time, she was found to have 40% left main, a large LAD that wrapped around the apex with 25% stenosis.  There is diffuse to stenosis at the right coronary artery which was a small vessel, 95% narrowed with collaterals already in place.  SOCIAL HISTORY:  The patient has a history of longstanding tobacco abuse.  She is down now to less than a pack of cigarettes a day, but previously smoked a pack and half.  She denies alcohol abuse.  Her family history is negative for premature coronary artery disease.  REVIEW OF SYSTEMS:  As noted in the HPI, otherwise all systems were negative except for some mild arthritic pain and generalized weakness.  PHYSICAL EXAMINATION:  GENERAL:  She is a pleasant elderly woman in no acute distress.  She appears to be somewhat lethargic.  She answers questions appropriately. VITAL SIGNS:  Her blood pressure will was 125/70, pulse was 64 and regular, respirations were 18-20, temperature was 98. HEENT: Normocephalic and atraumatic.  Pupils were equal, round. Oropharynx was moist.  Sclerae anicteric. NECK:  Revealed bilateral bruits.  There was no jugular venous distention.  There was  no thyromegaly.  Trachea was midline. LUNGS:  Clear bilaterally to auscultation.  No wheezes, rales, or rhonchi were present.  There was no increased work of breathing. CARDIOVASCULAR:  Regular rate and rhythm.  Normal S1 and S2.  The PMI was not particularly enlarged or laterally displaced. ABDOMEN:  Soft, nontender.  There was no organomegaly.  There was no rebound or guarding. EXTREMITIES:  Demonstrated no cyanosis, clubbing, or edema.  Pulses were 1+ and symmetric.  There was no peripheral edema. NEUROLOGIC:  She was alert and oriented x3.  Cranial nerves intact. Strength was 5/5 and symmetric.  EKG demonstrates atrial pacing with J-point elevation in V3 and V4. Chest x-ray and labs are pending.  IMPRESSION: 1. Very atypical chest  pain with no clearcut ischemic EKG changes. 2. Symptomatic bradycardia, status post pacemaker insertion. 3. Hypertension. 4. Dyslipidemia. 5. Peripheral vascular disease.  DISCUSSION:  The etiology of the patient's symptoms was unclear to me. I am not convinced that this is a coronary syndrome, but we will have her admitted to the hospital and follow her with serial cardiac enzymes. Additional recommendations will be based on the results of her cardiac markers.  We will continue her home medications for now.     Doylene Canning. Ladona Ridgel, MD     GWT/MEDQ  D:  09/20/2010  T:  09/20/2010  Job:  [id number]  Electronically Signed by Lewayne Bunting MD on 10/08/2010 12:52:26 PM

## 2010-10-09 ENCOUNTER — Encounter: Payer: Medicare Other | Admitting: *Deleted

## 2010-10-15 ENCOUNTER — Telehealth: Payer: Self-pay | Admitting: *Deleted

## 2010-10-15 MED ORDER — MIRTAZAPINE 15 MG PO TABS: 15.0000 mg | ORAL_TABLET | Freq: Every day | ORAL | Status: DC

## 2010-10-15 NOTE — Telephone Encounter (Signed)
Patient says that the remeron is not working for her. She says that she can't tell any difference since taking the remeron. She is asking if she can try something else. Uses [facility].

## 2010-10-15 NOTE — Telephone Encounter (Signed)
Called patient x 2 and got a busy signal both times.  Will call again later.

## 2010-10-15 NOTE — Telephone Encounter (Signed)
Patient advised as instructed via telephone.  She is willing to try higher dose for now and see how that does.  Rx sent to [facility] pharmacy.

## 2010-10-15 NOTE — Telephone Encounter (Signed)
I would prefer if we can try increasing the dose first.  Often, the dose is not high enough.  If so, please send in rx for remeron 15 mg nightly, no refills. Thank you.

## 2010-10-27 ENCOUNTER — Other Ambulatory Visit: Payer: Self-pay | Admitting: *Deleted

## 2010-10-27 MED ORDER — CELECOXIB 100 MG PO CAPS: 100.0000 mg | ORAL_CAPSULE | Freq: Two times a day (BID) | ORAL | Status: DC

## 2010-10-27 MED ORDER — MELOXICAM 7.5 MG PO TABS: 7.5000 mg | ORAL_TABLET | Freq: Every day | ORAL | Status: DC

## 2010-10-27 NOTE — Telephone Encounter (Signed)
Patient advised as instructed via telephone.  She stated that Celebrex is to expensive and she can't afford it.  Her insurance won't cover it and she has to pay $100 out of pocket.  She wants to know if there is something else she could try that may not be so expensive.

## 2010-10-27 NOTE — Telephone Encounter (Signed)
Pt is asking for celebrex for knee and leg pain.  She has been on this in the past but it is no longer on her med list.  Uses [facility].

## 2010-10-27 NOTE — Telephone Encounter (Signed)
Rx sent 

## 2010-10-27 NOTE — Telephone Encounter (Signed)
Patient advised as instructed via telephone.  Meloxicam called to pharmacy.  Rx is much cheaper around $3 per pharmacist.

## 2010-10-27 NOTE — Telephone Encounter (Signed)
She is already on tylenol and tramadol so I'm not sure there truly is anything else.   Has she tried meloxicam?

## 2010-10-31 ENCOUNTER — Emergency Department (HOSPITAL_COMMUNITY)
Admission: EM | Admit: 2010-10-31 | Discharge: 2010-10-31 | Disposition: A | Discharge status: Home / Self Care | Payer: Medicare Other | Attending: Emergency Medicine | Admitting: Emergency Medicine

## 2010-10-31 DIAGNOSIS — R5381 Other malaise: Secondary | ICD-10-CM | POA: Insufficient documentation

## 2010-10-31 DIAGNOSIS — R110 Nausea: Secondary | ICD-10-CM | POA: Insufficient documentation

## 2010-10-31 DIAGNOSIS — T3995XA Adverse effect of unspecified nonopioid analgesic, antipyretic and antirheumatic, initial encounter: Secondary | ICD-10-CM | POA: Insufficient documentation

## 2010-10-31 DIAGNOSIS — Z950 Presence of cardiac pacemaker: Secondary | ICD-10-CM | POA: Insufficient documentation

## 2010-10-31 DIAGNOSIS — R5383 Other fatigue: Secondary | ICD-10-CM | POA: Insufficient documentation

## 2010-10-31 DIAGNOSIS — I10 Essential (primary) hypertension: Secondary | ICD-10-CM | POA: Insufficient documentation

## 2010-10-31 DIAGNOSIS — E860 Dehydration: Secondary | ICD-10-CM | POA: Insufficient documentation

## 2010-10-31 DIAGNOSIS — E119 Type 2 diabetes mellitus without complications: Secondary | ICD-10-CM | POA: Insufficient documentation

## 2010-10-31 DIAGNOSIS — I252 Old myocardial infarction: Secondary | ICD-10-CM | POA: Insufficient documentation

## 2010-10-31 DIAGNOSIS — E7800 Pure hypercholesterolemia, unspecified: Secondary | ICD-10-CM | POA: Insufficient documentation

## 2010-10-31 DIAGNOSIS — R42 Dizziness and giddiness: Secondary | ICD-10-CM | POA: Insufficient documentation

## 2010-10-31 DIAGNOSIS — R630 Anorexia: Secondary | ICD-10-CM | POA: Insufficient documentation

## 2010-10-31 DIAGNOSIS — Z7982 Long term (current) use of aspirin: Secondary | ICD-10-CM | POA: Insufficient documentation

## 2010-10-31 DIAGNOSIS — Z79899 Other long term (current) drug therapy: Secondary | ICD-10-CM | POA: Insufficient documentation

## 2010-10-31 LAB — CBC
Hemoglobin: 14 g/dL (ref 12.0–15.0)
MCHC: 34 g/dL (ref 30.0–36.0)
RDW: 15 % (ref 11.5–15.5)

## 2010-10-31 LAB — DIFFERENTIAL
Basophils Absolute: 0 10*3/uL (ref 0.0–0.1)
Basophils Relative: 0 % (ref 0–1)
Eosinophils Relative: 1 % (ref 0–5)
Monocytes Absolute: 0.8 10*3/uL (ref 0.1–1.0)
Neutro Abs: 7.1 10*3/uL (ref 1.7–7.7)

## 2010-10-31 LAB — URINALYSIS, ROUTINE W REFLEX MICROSCOPIC
Nitrite: NEGATIVE
Specific Gravity, Urine: 1.027 (ref 1.005–1.030)
Urobilinogen, UA: 0.2 mg/dL (ref 0.0–1.0)

## 2010-10-31 LAB — BASIC METABOLIC PANEL
GFR calc Af Amer: 58 mL/min — ABNORMAL LOW (ref >60)
GFR calc non Af Amer: 48 mL/min — ABNORMAL LOW (ref >60)
Potassium: 5 mEq/L (ref 3.5–5.1)
Sodium: 138 mEq/L (ref 135–145)

## 2010-10-31 LAB — URINE MICROSCOPIC-ADD ON

## 2010-11-03 ENCOUNTER — Encounter: Payer: Self-pay | Admitting: Cardiovascular Disease

## 2010-11-05 ENCOUNTER — Other Ambulatory Visit: Payer: Self-pay | Admitting: *Deleted

## 2010-11-05 MED ORDER — HYDROCODONE-ACETAMINOPHEN 5-500 MG PO TABS: 1.0000 | ORAL_TABLET | Freq: Four times a day (QID) | ORAL | Status: DC | PRN

## 2010-11-05 MED ORDER — MIRTAZAPINE 15 MG PO TABS: 15.0000 mg | ORAL_TABLET | Freq: Every day | ORAL | Status: DC

## 2010-11-05 MED ORDER — MELOXICAM 7.5 MG PO TABS: 7.5000 mg | ORAL_TABLET | Freq: Every day | ORAL | Status: DC

## 2010-11-05 NOTE — Telephone Encounter (Signed)
Rx for Hydrocodone and Mobic called to pharmacy.

## 2010-12-01 ENCOUNTER — Ambulatory Visit: Payer: Medicare Other | Admitting: Ophthalmology

## 2010-12-03 ENCOUNTER — Encounter: Payer: Medicare Other | Admitting: Family Medicine

## 2010-12-03 DIAGNOSIS — Z008 Encounter for other general examination: Secondary | ICD-10-CM | POA: Insufficient documentation

## 2010-12-03 NOTE — Progress Notes (Signed)
Cancelled.  

## 2010-12-07 ENCOUNTER — Other Ambulatory Visit: Payer: Self-pay | Admitting: *Deleted

## 2010-12-07 ENCOUNTER — Ambulatory Visit: Payer: Medicare Other | Admitting: Ophthalmology

## 2010-12-07 MED ORDER — MIRTAZAPINE 15 MG PO TABS: 15.0000 mg | ORAL_TABLET | Freq: Every day | ORAL | Status: DC

## 2010-12-07 MED ORDER — MELOXICAM 7.5 MG PO TABS: 7.5000 mg | ORAL_TABLET | Freq: Every day | ORAL | Status: DC

## 2010-12-07 NOTE — Telephone Encounter (Signed)
Last filled 11/05/10.

## 2010-12-10 NOTE — Telephone Encounter (Signed)
Rx for Meloxicam called to pharmacy.

## 2011-01-20 ENCOUNTER — Encounter: Payer: Self-pay | Admitting: *Deleted

## 2011-02-08 ENCOUNTER — Other Ambulatory Visit: Payer: Self-pay | Admitting: *Deleted

## 2011-02-08 MED ORDER — MIRTAZAPINE 15 MG PO TABS: 15.0000 mg | ORAL_TABLET | Freq: Every day | ORAL | Status: DC

## 2011-02-08 NOTE — Telephone Encounter (Signed)
Last refill 01/07/2011. 

## 2011-02-10 LAB — DIFFERENTIAL
Basophils Absolute: 0
Basophils Relative: 0
Eosinophils Absolute: 0.1
Eosinophils Relative: 1
Lymphocytes Relative: 15
Lymphs Abs: 1.5
Monocytes Absolute: 0.5
Monocytes Relative: 5
Neutro Abs: 8 — ABNORMAL HIGH
Neutrophils Relative %: 79 — ABNORMAL HIGH

## 2011-02-10 LAB — CARDIAC PANEL(CRET KIN+CKTOT+MB+TROPI)
CK, MB: 1.4
CK, MB: 1.7
Relative Index: INVALID
Relative Index: INVALID
Total CK: 50
Troponin I: 0.04
Troponin I: 0.05
Troponin I: 0.07 — ABNORMAL HIGH

## 2011-02-10 LAB — POCT CARDIAC MARKERS
CKMB, poc: 1.3
Myoglobin, poc: 107
Operator id: 151321
Troponin i, poc: <0.05

## 2011-02-10 LAB — CBC
HCT: 38.1
HCT: 40.3
HCT: 42
Hemoglobin: 12.8
Hemoglobin: 13.8
Hemoglobin: 13.8
MCHC: 32.9
MCHC: 33.5
MCHC: 34.1
MCV: 97.1
MCV: 97.2
Platelets: 354
Platelets: 361
RBC: 3.93
RBC: 4.32
RDW: 14.7
RDW: 15.4
RDW: 15.5
WBC: 10.2

## 2011-02-10 LAB — B-NATRIURETIC PEPTIDE (CONVERTED LAB): Pro B Natriuretic peptide (BNP): 633 — ABNORMAL HIGH

## 2011-02-10 LAB — POCT I-STAT, CHEM 8
BUN: 14
Calcium, Ion: 1.07 — ABNORMAL LOW
Chloride: 104
Creatinine, Ser: 1.3 — ABNORMAL HIGH
Glucose, Bld: 90
HCT: 44
Hemoglobin: 15
Potassium: 4.4
Sodium: 140
TCO2: 29

## 2011-02-10 LAB — COMPREHENSIVE METABOLIC PANEL WITH GFR
ALT: 17
AST: 25
Albumin: 3.6
Calcium: 8.7
Chloride: 105
Creatinine, Ser: 1.06
GFR calc Af Amer: >60
Sodium: 141
Total Bilirubin: 0.4

## 2011-02-10 LAB — BASIC METABOLIC PANEL
BUN: 10
Calcium: 8.4
GFR calc non Af Amer: 48 — ABNORMAL LOW
Glucose, Bld: 181 — ABNORMAL HIGH
Potassium: 3.8
Sodium: 140

## 2011-02-10 LAB — APTT: aPTT: 25

## 2011-02-10 LAB — COMPREHENSIVE METABOLIC PANEL
Alkaline Phosphatase: 81
BUN: 12
CO2: 30
GFR calc non Af Amer: 50 — ABNORMAL LOW
Glucose, Bld: 96
Potassium: 4.4
Total Protein: 7

## 2011-02-10 LAB — PROTIME-INR
INR: 1
INR: 1.1
Prothrombin Time: 12.9

## 2011-02-12 LAB — BASIC METABOLIC PANEL
BUN: 24 — ABNORMAL HIGH
BUN: 32 — ABNORMAL HIGH
CO2: 25
CO2: 25
CO2: 26
CO2: 32
Calcium: 8.1 — ABNORMAL LOW
Calcium: 8.2 — ABNORMAL LOW
Chloride: 100
Chloride: 103
Chloride: 97
Creatinine, Ser: 1.07
Creatinine, Ser: 1.16
Creatinine, Ser: 1.19
Creatinine, Ser: 1.32 — ABNORMAL HIGH
GFR calc Af Amer: 47 — ABNORMAL LOW
GFR calc Af Amer: 54 — ABNORMAL LOW
GFR calc Af Amer: 60 — ABNORMAL LOW
GFR calc non Af Amer: 39 — ABNORMAL LOW
GFR calc non Af Amer: 44 — ABNORMAL LOW
GFR calc non Af Amer: 45 — ABNORMAL LOW
Glucose, Bld: 123 — ABNORMAL HIGH
Glucose, Bld: 125 — ABNORMAL HIGH
Glucose, Bld: 140 — ABNORMAL HIGH
Potassium: 3.2 — ABNORMAL LOW
Potassium: 3.4 — ABNORMAL LOW
Sodium: 133 — ABNORMAL LOW

## 2011-02-12 LAB — DIFFERENTIAL
Basophils Absolute: 0
Basophils Relative: 0
Lymphocytes Relative: 12
Lymphs Abs: 0.9
Monocytes Absolute: 0.2
Monocytes Relative: 2 — ABNORMAL LOW
Neutro Abs: 10 — ABNORMAL HIGH
Neutro Abs: 6.6
Neutrophils Relative %: 85 — ABNORMAL HIGH

## 2011-02-12 LAB — URINALYSIS, ROUTINE W REFLEX MICROSCOPIC
Glucose, UA: NEGATIVE
Leukocytes, UA: NEGATIVE
Nitrite: NEGATIVE
Specific Gravity, Urine: >1.03 — ABNORMAL HIGH
pH: 5

## 2011-02-12 LAB — TSH
TSH: 0.448
TSH: 1.11

## 2011-02-12 LAB — POCT I-STAT 3, ART BLOOD GAS (G3+)
Acid-base deficit: 2
Bicarbonate: 22.1
TCO2: 23
pO2, Arterial: 94

## 2011-02-12 LAB — APTT: aPTT: 32

## 2011-02-12 LAB — CBC
HCT: 37.7
HCT: 41.3
Hemoglobin: 12.5
MCHC: 33.6
MCV: 95.8
MCV: 95.9
Platelets: 269
Platelets: 282
RDW: 15.2
RDW: 15.4
RDW: 15.6 — ABNORMAL HIGH

## 2011-02-12 LAB — COMPREHENSIVE METABOLIC PANEL
Albumin: 2.8 — ABNORMAL LOW
BUN: 18
Creatinine, Ser: 1.3 — ABNORMAL HIGH
Total Protein: 6.4

## 2011-02-12 LAB — PROTIME-INR
INR: 1.6 — ABNORMAL HIGH
INR: 2.6 — ABNORMAL HIGH
INR: 2.9 — ABNORMAL HIGH
Prothrombin Time: 19.5 — ABNORMAL HIGH
Prothrombin Time: 20.2 — ABNORMAL HIGH
Prothrombin Time: 26 — ABNORMAL HIGH

## 2011-02-12 LAB — CARDIAC PANEL(CRET KIN+CKTOT+MB+TROPI)
CK, MB: 6.5 — ABNORMAL HIGH
Relative Index: 4.2 — ABNORMAL HIGH
Total CK: 155
Troponin I: 0.41 — ABNORMAL HIGH

## 2011-02-12 LAB — B-NATRIURETIC PEPTIDE (CONVERTED LAB)
Pro B Natriuretic peptide (BNP): 1747 — ABNORMAL HIGH
Pro B Natriuretic peptide (BNP): 2484 — ABNORMAL HIGH

## 2011-02-12 LAB — CK TOTAL AND CKMB (NOT AT ARMC): Relative Index: INVALID

## 2011-02-12 LAB — URINE MICROSCOPIC-ADD ON

## 2011-02-12 LAB — LIPID PANEL
Cholesterol: 133
LDL Cholesterol: 50

## 2011-02-12 LAB — POCT CARDIAC MARKERS: Myoglobin, poc: 258

## 2011-02-12 LAB — TROPONIN I: Troponin I: 0.29 — ABNORMAL HIGH

## 2011-02-15 LAB — GLUCOSE, CAPILLARY
Glucose-Capillary: 117 — ABNORMAL HIGH
Glucose-Capillary: 89

## 2011-02-17 LAB — CBC
HCT: 22.5 — ABNORMAL LOW
HCT: 26.7 — ABNORMAL LOW
HCT: 27.5 — ABNORMAL LOW
HCT: 32.9 — ABNORMAL LOW
Hemoglobin: 8 — ABNORMAL LOW
Hemoglobin: 9.3 — ABNORMAL LOW
MCHC: 33.1
MCHC: 33.8
MCHC: 34
MCHC: 34
MCV: 94.8
MCV: 95.4
MCV: 97.8
MCV: 98
Platelets: 320
RBC: 2.4 — ABNORMAL LOW
RBC: 2.8 — ABNORMAL LOW
RBC: 3.47 — ABNORMAL LOW
RDW: 16.6 — ABNORMAL HIGH
WBC: 12.9 — ABNORMAL HIGH
WBC: 13.1 — ABNORMAL HIGH
WBC: 15.7 — ABNORMAL HIGH
WBC: 16.4 — ABNORMAL HIGH

## 2011-02-17 LAB — GLUCOSE, CAPILLARY
Glucose-Capillary: 101 — ABNORMAL HIGH
Glucose-Capillary: 106 — ABNORMAL HIGH
Glucose-Capillary: 108 — ABNORMAL HIGH
Glucose-Capillary: 108 — ABNORMAL HIGH
Glucose-Capillary: 111 — ABNORMAL HIGH
Glucose-Capillary: 119 — ABNORMAL HIGH
Glucose-Capillary: 123 — ABNORMAL HIGH
Glucose-Capillary: 124 — ABNORMAL HIGH
Glucose-Capillary: 131 — ABNORMAL HIGH
Glucose-Capillary: 140 — ABNORMAL HIGH
Glucose-Capillary: 144 — ABNORMAL HIGH
Glucose-Capillary: 146 — ABNORMAL HIGH
Glucose-Capillary: 148 — ABNORMAL HIGH
Glucose-Capillary: 149 — ABNORMAL HIGH
Glucose-Capillary: 90

## 2011-02-17 LAB — PREPARE FRESH FROZEN PLASMA

## 2011-02-17 LAB — CROSSMATCH: Antibody Screen: NEGATIVE

## 2011-02-17 LAB — BASIC METABOLIC PANEL
BUN: 21
BUN: 50 — ABNORMAL HIGH
CO2: 26
CO2: 29
Chloride: 97
Chloride: 98
Creatinine, Ser: 1.47 — ABNORMAL HIGH
GFR calc Af Amer: 41 — ABNORMAL LOW
GFR calc non Af Amer: 57 — ABNORMAL LOW
Glucose, Bld: 123 — ABNORMAL HIGH
Potassium: 3.7
Potassium: 3.8

## 2011-02-17 LAB — WOUND CULTURE: Culture: NO GROWTH

## 2011-02-17 LAB — HEMOGLOBIN AND HEMATOCRIT, BLOOD
HCT: 24.1 — ABNORMAL LOW
HCT: 29.9 — ABNORMAL LOW
Hemoglobin: 8 — ABNORMAL LOW

## 2011-02-17 LAB — DIFFERENTIAL
Basophils Relative: 1
Eosinophils Absolute: 0
Eosinophils Relative: 0
Lymphs Abs: 1.4
Monocytes Relative: 6
Neutrophils Relative %: 85 — ABNORMAL HIGH

## 2011-02-17 LAB — URINALYSIS, ROUTINE W REFLEX MICROSCOPIC
Bilirubin Urine: NEGATIVE
Hgb urine dipstick: NEGATIVE
Ketones, ur: NEGATIVE
Nitrite: NEGATIVE
Protein, ur: NEGATIVE
Specific Gravity, Urine: 1.015
Urobilinogen, UA: 0.2

## 2011-02-17 LAB — PROTIME-INR
INR: 2.2 — ABNORMAL HIGH
INR: >9.8
Prothrombin Time: 25.4 — ABNORMAL HIGH

## 2011-02-17 LAB — CLOSTRIDIUM DIFFICILE EIA: C difficile Toxins A+B, EIA: NEGATIVE

## 2011-02-17 LAB — APTT: aPTT: 52 — ABNORMAL HIGH

## 2011-02-24 ENCOUNTER — Encounter: Payer: Self-pay | Admitting: Cardiovascular Disease

## 2011-02-24 ENCOUNTER — Inpatient Hospital Stay: Payer: Medicare Other | Admitting: Internal Medicine

## 2011-02-25 ENCOUNTER — Encounter: Payer: Self-pay | Admitting: Cardiovascular Disease

## 2011-02-25 DIAGNOSIS — R0602 Shortness of breath: Secondary | ICD-10-CM

## 2011-02-25 DIAGNOSIS — R748 Abnormal levels of other serum enzymes: Secondary | ICD-10-CM

## 2011-02-25 DIAGNOSIS — I059 Rheumatic mitral valve disease, unspecified: Secondary | ICD-10-CM

## 2011-02-26 DIAGNOSIS — Z950 Presence of cardiac pacemaker: Secondary | ICD-10-CM

## 2011-02-26 DIAGNOSIS — I2510 Atherosclerotic heart disease of native coronary artery without angina pectoris: Secondary | ICD-10-CM

## 2011-02-26 DIAGNOSIS — N189 Chronic kidney disease, unspecified: Secondary | ICD-10-CM

## 2011-02-27 ENCOUNTER — Encounter: Payer: Self-pay | Admitting: Cardiovascular Disease

## 2011-03-03 ENCOUNTER — Encounter: Payer: Self-pay | Admitting: Cardiovascular Disease

## 2011-03-04 ENCOUNTER — Encounter: Payer: Medicare Other | Admitting: Internal Medicine

## 2011-03-11 ENCOUNTER — Ambulatory Visit: Payer: Medicare Other | Admitting: Family Medicine

## 2011-03-12 ENCOUNTER — Ambulatory Visit: Payer: Medicare Other | Admitting: Family Medicine

## 2011-03-18 ENCOUNTER — Other Ambulatory Visit: Payer: Self-pay | Admitting: *Deleted

## 2011-03-18 ENCOUNTER — Encounter: Payer: Medicare Other | Admitting: Internal Medicine

## 2011-03-18 MED ORDER — MELOXICAM 7.5 MG PO TABS: 7.5000 mg | ORAL_TABLET | Freq: Every day | ORAL | Status: DC

## 2011-03-18 NOTE — Telephone Encounter (Signed)
Rx sent electronically.  

## 2011-03-22 ENCOUNTER — Telehealth: Payer: Self-pay | Admitting: *Deleted

## 2011-03-22 NOTE — Telephone Encounter (Signed)
Notified Debra as instructed via telephone.

## 2011-03-22 NOTE — Telephone Encounter (Signed)
Yes ok 

## 2011-03-22 NOTE — Telephone Encounter (Signed)
Pt has been discharged from [facility] and will be needing PT and OT.  Please call with verbal ok.

## 2011-03-23 ENCOUNTER — Encounter: Payer: Self-pay | Admitting: Cardiovascular Disease

## 2011-03-25 ENCOUNTER — Encounter: Payer: Self-pay | Admitting: Cardiovascular Disease

## 2011-03-25 ENCOUNTER — Ambulatory Visit (INDEPENDENT_AMBULATORY_CARE_PROVIDER_SITE_OTHER): Payer: Medicare Other | Admitting: Cardiovascular Disease

## 2011-03-25 DIAGNOSIS — I4891 Unspecified atrial fibrillation: Secondary | ICD-10-CM

## 2011-03-25 DIAGNOSIS — F17200 Nicotine dependence, unspecified, uncomplicated: Secondary | ICD-10-CM

## 2011-03-25 DIAGNOSIS — J189 Pneumonia, unspecified organism: Secondary | ICD-10-CM

## 2011-03-25 DIAGNOSIS — E785 Hyperlipidemia, unspecified: Secondary | ICD-10-CM

## 2011-03-25 DIAGNOSIS — I70219 Atherosclerosis of native arteries of extremities with intermittent claudication, unspecified extremity: Secondary | ICD-10-CM

## 2011-03-25 DIAGNOSIS — I10 Essential (primary) hypertension: Secondary | ICD-10-CM

## 2011-03-25 DIAGNOSIS — I2510 Atherosclerotic heart disease of native coronary artery without angina pectoris: Secondary | ICD-10-CM

## 2011-03-25 NOTE — Assessment & Plan Note (Signed)
She has significant carotid and subclavian disease. She has followup with vascular surgery in the next month.

## 2011-03-25 NOTE — Assessment & Plan Note (Signed)
Blood pressure is well controlled on today's visit. No changes made to the medications. 

## 2011-03-25 NOTE — Assessment & Plan Note (Signed)
She has recovered well from her recent pneumonia. She did not have heart failure has minimal diuresis caused worsening renal failure.

## 2011-03-25 NOTE — Progress Notes (Signed)
Patient ID: [name], female    DOB: [birth year], 77 y.o.   MRN: [id number]  HPI Comments: Ms. Sharonne Ricketts is a very pleasant 77 year old woman with past medical history of coronary artery disease, stent placed to her left circumflex in 2003 which was patent on catheterization in 2008, history of peripheral vascular disease with bilateral carotid disease, 79% of the right internal carotid, 40-59% left internal carotid artery, also history of COPD, history of respiratory failure in August 2009 secondary to rapid atrial fibrillation and diastolic relaxation abnormality, history of sick sinus syndrome with ablation in May of 2009 with discontinuation of Coumadin in September of 09 secondary to bleeding.  She has had a recent hospital admission from October 10 to March 03, 2011. This was for pneumonia. Gentle diuresis was started she had acute renal failure. She had mildly elevated troponin which was secondary to demand ischemia. Normal LV function on echocardiogram with mild MR and TR.  CT Scan in the hospital showed high-grade stenosis at the origin and proximal portion of the innominate artery, high-grade stenosis of the right carotid bifurcation, moderate stenosis of the proximal prevertebral left subclavian artery.  Today she reports that she feels well.She is feeling stronger and has no complaints. She does have some lower extremity edema worse on the left than the right though this has been chronic. She has stopped smoking since the hospital. Her daughter is concerned as she is drinking Ensure 3 cans per day.   history of knee replacement bilaterally over 10 years ago. She states that she seen her orthopedic physician sometime ago and x-rays were done though nothing was performed. She has tried Aleve and Tylenol with no improvement of her pain.  EKG shows Paced rhythm at 60 beats per minute   Outpatient Encounter Prescriptions as of 03/25/2011  Medication Sig Dispense Refill  .  acetaminophen (TYLENOL) 650 MG CR tablet Take 650 mg by mouth every 8 (eight) hours as needed.        [name] aspirin 81 MG tablet Take 81 mg by mouth daily.        [name] atorvastatin (LIPITOR) 40 MG tablet Take 40 mg by mouth daily.        . carvedilol (COREG) 3.125 MG tablet Take 3.125 mg by mouth 2 (two) times daily with a meal.        . digoxin (LANOXIN) 0.125 MG tablet Take 125 mcg by mouth daily.        [name] docusate sodium (COLACE) 100 MG capsule Take 100 mg by mouth 2 (two) times daily.        . Ipratropium-Albuterol (COMBIVENT IN) Inhale into the lungs.        . meloxicam (MOBIC) 7.5 MG tablet Take 1 tablet (7.5 mg total) by mouth daily.  30 tablet  0  . metFORMIN (GLUMETZA) 500 MG (MOD) 24 hr tablet Take 500 mg by mouth 2 (two) times daily with a meal.        . mirtazapine (REMERON) 15 MG tablet Take 1 tablet (15 mg total) by mouth at bedtime.  30 tablet  6  . nicotine (NICODERM CQ - DOSED IN MG/24 HOURS) 14 mg/24hr patch Place 1 patch onto the skin daily.        . nitroGLYCERIN (NITROSTAT) 0.4 MG SL tablet Place 0.4 mg under the tongue every 5 (five) minutes as needed.        . ramipril (ALTACE) 5 MG capsule Take 5 mg by mouth daily.        [name]  furosemide (LASIX) 40 MG tablet Take 40 mg one tablet daily as needed for swelling.          Review of Systems  Constitutional:       Insomnia  HENT: Negative.   Eyes: Negative.   Respiratory: Negative.   Cardiovascular: Positive for leg swelling.  Gastrointestinal: Negative.   Musculoskeletal: Negative.   Skin: Negative.   Neurological: Negative.   Hematological: Negative.   Psychiatric/Behavioral: Positive for sleep disturbance.  All other systems reviewed and are negative.    BP 142/60  Pulse 60  Ht 5\' 5"  (1.651 m)  Wt 157 lb (71.215 kg)  BMI 26.13 kg/m2   Physical Exam  Nursing note and vitals reviewed. Constitutional: She is oriented to person, place, and time. She appears well-developed and well-nourished.  HENT:  Head:  Normocephalic.  Nose: Nose normal.  Mouth/Throat: Oropharynx is clear and moist.  Eyes: Conjunctivae are normal. Pupils are equal, round, and reactive to light.  Neck: Normal range of motion. Neck supple. No JVD present.  Cardiovascular: Normal rate, regular rhythm, S1 normal, S2 normal and intact distal pulses.  Exam reveals no gallop and no friction rub.   Murmur heard.  Systolic murmur is present with a grade of 2/6       Mild non-pitting leg edema on the left, trace on the right  Pulmonary/Chest: Effort normal and breath sounds normal. No respiratory distress. She has no wheezes. She has no rales. She exhibits no tenderness.  Abdominal: Soft. Bowel sounds are normal. She exhibits no distension. There is no tenderness.  Musculoskeletal: Normal range of motion. She exhibits no edema and no tenderness.  Lymphadenopathy:    She has no cervical adenopathy.  Neurological: She is alert and oriented to person, place, and time. Coordination normal.  Skin: Skin is warm and dry. No rash noted. No erythema.  Psychiatric: She has a normal mood and affect. Her behavior is normal. Judgment and thought content normal.         Assessment and Plan

## 2011-03-25 NOTE — Assessment & Plan Note (Signed)
Cholesterol is at goal on the current lipid regimen. No changes to the medications were made.  

## 2011-03-25 NOTE — Assessment & Plan Note (Signed)
Currently with no symptoms of angina. No further workup at this time. Continue current medication regimen. 

## 2011-03-25 NOTE — Patient Instructions (Addendum)
You are doing well. No medication changes were made. Please call us if you have new issues that need to be addressed before your next appt.   Your physician recommends that you schedule a follow-up appointment in: 1 year  Please call the office if your swelling is getting worse.

## 2011-03-25 NOTE — Assessment & Plan Note (Signed)
She is currently wearing a nicotine patch. We having her shirt to continue with smoking cessation given her severe peripheral vascular disease and coronary artery disease.

## 2011-04-14 ENCOUNTER — Other Ambulatory Visit: Payer: Self-pay | Admitting: *Deleted

## 2011-04-14 DIAGNOSIS — E119 Type 2 diabetes mellitus without complications: Secondary | ICD-10-CM

## 2011-04-14 DIAGNOSIS — J189 Pneumonia, unspecified organism: Secondary | ICD-10-CM

## 2011-04-14 DIAGNOSIS — I509 Heart failure, unspecified: Secondary | ICD-10-CM

## 2011-04-14 MED ORDER — DIGOXIN 125 MCG PO TABS: 125.0000 ug | ORAL_TABLET | Freq: Every day | ORAL | Status: DC

## 2011-04-17 ENCOUNTER — Encounter: Payer: Medicare Other | Admitting: Internal Medicine

## 2011-04-22 ENCOUNTER — Other Ambulatory Visit: Payer: Self-pay | Admitting: *Deleted

## 2011-04-22 MED ORDER — RAMIPRIL 5 MG PO CAPS: 5.0000 mg | ORAL_CAPSULE | Freq: Every day | ORAL | Status: DC

## 2011-04-22 MED ORDER — MELOXICAM 7.5 MG PO TABS: 7.5000 mg | ORAL_TABLET | Freq: Every day | ORAL | Status: DC

## 2011-04-28 ENCOUNTER — Encounter: Payer: Self-pay | Admitting: Cardiovascular Disease

## 2011-05-20 ENCOUNTER — Other Ambulatory Visit: Payer: Self-pay | Admitting: *Deleted

## 2011-05-20 MED ORDER — ATORVASTATIN CALCIUM 40 MG PO TABS: 40.0000 mg | ORAL_TABLET | Freq: Every day | ORAL | Status: DC

## 2011-05-20 MED ORDER — CARVEDILOL 3.125 MG PO TABS: 3.1250 mg | ORAL_TABLET | Freq: Two times a day (BID) | ORAL | Status: DC

## 2011-05-31 ENCOUNTER — Encounter: Payer: Self-pay | Admitting: Family Medicine

## 2011-05-31 ENCOUNTER — Ambulatory Visit (INDEPENDENT_AMBULATORY_CARE_PROVIDER_SITE_OTHER): Payer: Medicare Other | Admitting: Family Medicine

## 2011-05-31 VITALS — BP 132/84 | HR 85 | Temp 97.8°F | Wt 154.2 lb

## 2011-05-31 DIAGNOSIS — J189 Pneumonia, unspecified organism: Secondary | ICD-10-CM

## 2011-05-31 DIAGNOSIS — N289 Disorder of kidney and ureter, unspecified: Secondary | ICD-10-CM

## 2011-05-31 DIAGNOSIS — R609 Edema, unspecified: Secondary | ICD-10-CM

## 2011-05-31 DIAGNOSIS — R0602 Shortness of breath: Secondary | ICD-10-CM

## 2011-05-31 DIAGNOSIS — R600 Localized edema: Secondary | ICD-10-CM

## 2011-05-31 DIAGNOSIS — I5032 Chronic diastolic (congestive) heart failure: Secondary | ICD-10-CM

## 2011-05-31 DIAGNOSIS — I509 Heart failure, unspecified: Secondary | ICD-10-CM

## 2011-05-31 LAB — BRAIN NATRIURETIC PEPTIDE: Pro B Natriuretic peptide (BNP): 542 pg/mL — ABNORMAL HIGH (ref 0.0–100.0)

## 2011-05-31 LAB — BASIC METABOLIC PANEL WITH GFR
BUN: 15 mg/dL (ref 6–23)
CO2: 28 meq/L (ref 19–32)
Calcium: 8.5 mg/dL (ref 8.4–10.5)
Chloride: 101 meq/L (ref 96–112)
Creatinine, Ser: 1.2 mg/dL (ref 0.4–1.2)
GFR: 54.35 mL/min — ABNORMAL LOW
Glucose, Bld: 100 mg/dL — ABNORMAL HIGH (ref 70–99)
Potassium: 4.5 meq/L (ref 3.5–5.1)
Sodium: 142 meq/L (ref 135–145)

## 2011-05-31 MED ORDER — FUROSEMIDE 20 MG PO TABS: 10.0000 mg | ORAL_TABLET | Freq: Every day | ORAL | Status: DC

## 2011-05-31 NOTE — Patient Instructions (Signed)
Good to see you. Please stop by to see [name] after you go to the lab. 

## 2011-05-31 NOTE — Progress Notes (Signed)
Patient ID: [name], female    DOB: 1923-11-22, 78 y.o.   MRN: [id number]  HPI Comments: [name] is a very pleasant 78 year old woman with past medical history of coronary artery disease, stent placed to her left circumflex in 2003 which was patent on catheterization in 2008, history of peripheral vascular disease with bilateral carotid disease, 79% of the right internal carotid, 40-59% left internal carotid artery, also history of COPD, history of respiratory failure in August 2009 secondary to rapid atrial fibrillation and diastolic relaxation abnormality, history of sick sinus syndrome with ablation in May of 2009 with discontinuation of Coumadin in September of 09 secondary to bleeding. 78 yo here for hospital follow up.  Notes reviewed- admitted to [facility] from 10/10- 03/03/2011 for PNA at [facility].    She had mildly elevated troponin which was secondary to demand ischemia. Normal LV function on echocardiogram with mild MR and TR. Elevated BNP and was therefore diuresed in hospital but oral lasix was stopped prior to discharge due to worsening renal function.   Today she reports that her  Legs hurt.   Edema has been slowly worsening over past month. Now legs very painful to touch, left > right.  Saw Dr. Mariah Milling on 03/25/2011.  Patient Active Problem List  Diagnoses  . COLITIS ENTERIT&GASTROENTERIT INF ORIGIN  . PITUITARY ADENOMA  . ADRENAL MASS, BILATERAL  . PURE HYPERCHOLESTEROLEMIA  . HYPERLIPIDEMIA-MIXED  . TOBACCO ABUSE  . ADJUSTMENT DISORDER WITH ANXIETY  . HYPERTENSION, BENIGN ESSENTIAL  . CAD, NATIVE VESSEL  . ATRIAL FIBRILLATION  . CHRONIC DIASTOLIC HEART FAILURE  . ATHEROSCLEROSIS W /INT CLAUDICATION  . PVD  . PNEUMONIA  . COPD  . ACUTE RESPIRATORY FAILURE  . DEGENERATIVE JOINT DISEASE, ADVANCED  . DEGENERATIVE JOINT DISEASE, LEFT HIP  . ARTHRITIS  . SHOULDER PAIN  . KNEE PAIN, BILATERAL  . DIZZINESS  . FATIGUE  . LYMPHADENOPATHY  . CAROTID BRUIT,  RIGHT  . CHEST PAIN, ATYPICAL  . ABDOMINAL PAIN RIGHT UPPER QUADRANT  . MICROALBUMINURIA  . CARDIAC PACEMAKER IN SITU  . Other specified general medical examination  . Pneumonia  . Renal insufficiency   Past Medical History  Diagnosis Date  . Coronary artery disease   . MI (myocardial infarction)   . PVD (peripheral vascular disease)   . History of atrial flutter   . Tachy-brady syndrome   . Paroxysmal atrial fibrillation   . COPD (chronic obstructive pulmonary disease)   . Hyperlipidemia   . Diabetes mellitus   . Diastolic congestive heart failure   . Arthritis   . Chronic kidney disease    Past Surgical History  Procedure Date  . Cardiac catheterization   . Insert / replace / remove pacemaker     St. Jude  . Cholecystectomy   . Total knee arthroplasty     bilateral  . Bilateral hip arthroscopy   . 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 Myocardial perfusion study     EF 59% 4/04  . Adenosine myoview study     abn EF 53% 12/04/04  . Carotid doppler 2/99  . Echo with lvh 1/98    old records  . Stress cardiolite 3/04    EF 53%  . Gamma knife radiosurg 08/25/06    [facility]  . Pvd     with stent in the right iliac    History  Substance Use Topics  . Smoking status: Former Smoker -- 0.5 packs/day  for 0 years    Types: Cigarettes    Quit date: 02/22/2011  . Smokeless tobacco: Never Used  . Alcohol Use: No   Family History  Problem Relation Age of Onset  . Cancer Neg Hx   . Depression      ?  . Diabetes      DM and HBP- family hx   Allergies  Allergen Reactions  . Naproxen     REACTION: gastritis   Current Outpatient Prescriptions on File Prior to Visit  Medication Sig Dispense Refill  . acetaminophen (TYLENOL) 650 MG CR tablet Take 650 mg by mouth every 8 (eight) hours as needed.        [name] aspirin 81 MG tablet Take 81 mg by mouth daily.        [name] atorvastatin (LIPITOR) 40 MG tablet Take 1 tablet (40 mg total) by  mouth daily.  30 tablet  6  . carvedilol (COREG) 3.125 MG tablet Take 1 tablet (3.125 mg total) by mouth 2 (two) times daily with a meal.  60 tablet  6  . digoxin (LANOXIN) 0.125 MG tablet Take 1 tablet (125 mcg total) by mouth daily.  30 tablet  6  . Ipratropium-Albuterol (COMBIVENT IN) Inhale into the lungs.        . meloxicam (MOBIC) 7.5 MG tablet Take 1 tablet (7.5 mg total) by mouth daily.  30 tablet  0  . metFORMIN (GLUMETZA) 500 MG (MOD) 24 hr tablet Take 500 mg by mouth 2 (two) times daily with a meal.        . mirtazapine (REMERON) 15 MG tablet Take 1 tablet (15 mg total) by mouth at bedtime.  30 tablet  6  . nitroGLYCERIN (NITROSTAT) 0.4 MG SL tablet Place 0.4 mg under the tongue every 5 (five) minutes as needed.        . ramipril (ALTACE) 5 MG capsule Take 1 capsule (5 mg total) by mouth daily.  30 capsule  12  . docusate sodium (COLACE) 100 MG capsule Take 100 mg by mouth 2 (two) times daily.        . nicotine (NICODERM CQ - DOSED IN MG/24 HOURS) 14 mg/24hr patch Place 1 patch onto the skin daily.         The PMH, PSH, Social History, Family History, Medications, and allergies have been reviewed in [facility], and have been updated if relevant.   Review of Systems  Constitutional:       Insomnia  HENT: Negative.   Eyes: Negative.   Respiratory: Negative for SOB.   Cardiovascular: Positive for leg swelling.     Physical exam: BP 132/84  Pulse 85  Temp(Src) 97.8 F (36.6 C) (Oral)  Wt 154 lb 4 oz (69.967 kg) Constitutional: She is oriented to person, place, and time. She appears well-developed and well-nourished.  HENT:  Head: Normocephalic.  Nose: Nose normal.  Mouth/Throat: Oropharynx is clear and moist.  Eyes: Conjunctivae are normal. Pupils are equal, round, and reactive to light.  Neck: Normal range of motion. Neck supple. No JVD present.  Cardiovascular: Normal rate, regular rhythm, S1 normal, S2 normal and intact distal pulses.  Exam reveals no gallop and no friction  rub.   Murmur heard.  Systolic murmur is present with a grade of 2/6       Bilateral 2+ pitting edema bilaterally Pulmonary/Chest: Effort normal and breath sounds normal. No respiratory distress. She has no wheezes. She has no rales. She exhibits no tenderness.  Abdominal: Soft. Bowel  sounds are normal. She exhibits no distension. There is no tenderness.  Musculoskeletal: Normal range of motion. She exhibits no edema and no tenderness.  Lymphadenopathy:    She has no cervical adenopathy.  Neurological: She is alert and oriented to person, place, and time. Coordination normal.  Skin: Skin is warm and dry. No rash noted. No erythema.  Psychiatric: She has a normal mood and affect. Her behavior is normal. Judgment and thought content normal.         Assessment and Plan   1. Pneumonia  New- resolved.  No further tx necessary.   2. Renal insufficiency  Recheck BMET today.  Basic Metabolic Panel (BMET)  3. Lower extremity edema  Deteriorated. Recheck BNP.  Will start low dose lasix- 10 mg daily.  Follow up with Dr. Mariah Milling. B Nat Peptide

## 2011-06-03 ENCOUNTER — Other Ambulatory Visit: Payer: Self-pay | Admitting: *Deleted

## 2011-06-03 MED ORDER — ACETAMINOPHEN ER 650 MG PO TBCR: 650.0000 mg | EXTENDED_RELEASE_TABLET | Freq: Three times a day (TID) | ORAL | Status: DC | PRN

## 2011-06-03 NOTE — Telephone Encounter (Signed)
Patient is requesting a refill for Tylenol 650mg .  She stated that the swelling is gone down but she still needs something for pain.  Please advise.

## 2011-06-03 NOTE — Telephone Encounter (Signed)
Rx for Tylenol called to [facility] pharmacy.  Patients granddaughter advised via telephone.

## 2011-06-11 ENCOUNTER — Ambulatory Visit: Payer: Medicare Other | Admitting: Cardiovascular Disease

## 2011-06-24 ENCOUNTER — Telehealth: Payer: Self-pay | Admitting: Family Medicine

## 2011-06-24 NOTE — Telephone Encounter (Signed)
No if she denied services, then ok to not call her back.

## 2011-06-24 NOTE — Telephone Encounter (Signed)
Maribeth advised as instructed via telephone.

## 2011-06-24 NOTE — Telephone Encounter (Signed)
Patient is on the high risk list.  Armandina Stammer contacted patient before in October or [facility] Dr.Aron referred patient, and patient denied services.  Armandina Stammer wants to know if you want her to contact the patient again.

## 2011-06-28 ENCOUNTER — Telehealth: Payer: Self-pay | Admitting: Internal Medicine

## 2011-06-28 NOTE — Telephone Encounter (Signed)
04-28-11 sent past due letter/mt 06-28-11 lm w/pt's son to call [place] and set up device check/mt

## 2011-07-15 ENCOUNTER — Other Ambulatory Visit: Payer: Self-pay | Admitting: *Deleted

## 2011-07-15 MED ORDER — MELOXICAM 7.5 MG PO TABS: 7.5000 mg | ORAL_TABLET | Freq: Every day | ORAL | Status: DC

## 2011-07-19 ENCOUNTER — Other Ambulatory Visit: Payer: Self-pay | Admitting: *Deleted

## 2011-07-19 MED ORDER — METFORMIN HCL ER (MOD) 500 MG PO TB24: 500.0000 mg | ORAL_TABLET | Freq: Two times a day (BID) | ORAL | Status: DC

## 2011-07-29 ENCOUNTER — Telehealth: Payer: Self-pay | Admitting: *Deleted

## 2011-07-29 NOTE — Telephone Encounter (Signed)
Patient would like a call back from Dr. Dayton Martes and would not give any details. She just wanted to talk to Dr. Dayton Martes.+

## 2011-07-29 NOTE — Telephone Encounter (Signed)
Returned pt's call. She had a question about a bill she received from the rehab facility.  I explained that I cannot help with billing from that facility and recommended calling Medicare or the facility.  She stated that she understood and would call them.

## 2011-08-16 ENCOUNTER — Other Ambulatory Visit: Payer: Self-pay | Admitting: *Deleted

## 2011-08-16 MED ORDER — MELOXICAM 7.5 MG PO TABS: 7.5000 mg | ORAL_TABLET | Freq: Every day | ORAL | Status: DC

## 2011-08-16 MED ORDER — FUROSEMIDE 20 MG PO TABS: 10.0000 mg | ORAL_TABLET | Freq: Every day | ORAL | Status: DC

## 2011-08-16 NOTE — Telephone Encounter (Signed)
Faxed request from [facility], last filled 07/15/11 for # 30.

## 2011-10-07 ENCOUNTER — Encounter: Payer: Self-pay | Admitting: *Deleted

## 2011-10-13 ENCOUNTER — Other Ambulatory Visit: Payer: Self-pay | Admitting: *Deleted

## 2011-10-13 MED ORDER — MELOXICAM 7.5 MG PO TABS: 7.5000 mg | ORAL_TABLET | Freq: Every day | ORAL | Status: DC

## 2011-10-13 MED ORDER — DOCUSATE SODIUM 100 MG PO CAPS: 100.0000 mg | ORAL_CAPSULE | Freq: Two times a day (BID) | ORAL | Status: DC

## 2011-10-13 MED ORDER — MIRTAZAPINE 15 MG PO TABS: 15.0000 mg | ORAL_TABLET | Freq: Every day | ORAL | Status: DC

## 2011-10-13 NOTE — Telephone Encounter (Signed)
Faxed refill requests from [facility].  Both last filled 08/15/28 for # 30 each.

## 2011-11-02 ENCOUNTER — Ambulatory Visit (INDEPENDENT_AMBULATORY_CARE_PROVIDER_SITE_OTHER): Payer: Medicare Other | Admitting: Internal Medicine

## 2011-11-02 ENCOUNTER — Encounter: Payer: Self-pay | Admitting: Internal Medicine

## 2011-11-02 VITALS — BP 142/62 | HR 69 | Ht 65.0 in | Wt 124.0 lb

## 2011-11-02 DIAGNOSIS — Z950 Presence of cardiac pacemaker: Secondary | ICD-10-CM

## 2011-11-02 DIAGNOSIS — R634 Abnormal weight loss: Secondary | ICD-10-CM

## 2011-11-02 DIAGNOSIS — I4891 Unspecified atrial fibrillation: Secondary | ICD-10-CM

## 2011-11-02 DIAGNOSIS — Z79899 Other long term (current) drug therapy: Secondary | ICD-10-CM

## 2011-11-02 DIAGNOSIS — R0602 Shortness of breath: Secondary | ICD-10-CM

## 2011-11-02 LAB — PACEMAKER DEVICE OBSERVATION
AL AMPLITUDE: 0.6 mv
AL IMPEDENCE PM: 336 Ohm
AL THRESHOLD: 0.5 V
RV LEAD AMPLITUDE: 12 mv
RV LEAD THRESHOLD: 0.625 V

## 2011-11-02 NOTE — Assessment & Plan Note (Signed)
Her pacemaker is working normally. We'll plan to recheck in several months. 

## 2011-11-02 NOTE — Assessment & Plan Note (Signed)
The patient has lost over 30 pounds in the last 16 months. The etiology is unclear. She has a history of tobacco use. I will obtain a chest x-ray, and will have her undergo laboratory evaluation. I have referred her back to her primary physician.

## 2011-11-02 NOTE — Patient Instructions (Addendum)
Need to have lab work today: CBC,TSH,BMP & Liver panel. Need to go down stairs for a chest x-ray.  Follow up with Gunnar Fusi in 6 months. Follow up with Dr. Ladona Ridgel in 12 months.

## 2011-11-02 NOTE — Assessment & Plan Note (Signed)
She has been out of rhythm less than 1% of the time. Her longest episode was just over 7 hours. Ultimately, she may require anticoagulation however other medical problems will need to be ruled out and worked over first.

## 2011-11-02 NOTE — Progress Notes (Signed)
HPI [name] returns today for followup. She is a very pleasant 78 year old woman with a history of symptomatic bradycardia, status post permanent pacemaker insertion. She also has a history of hypertension, paroxysmal atrial fibrillation, and coronary disease. The patient complains of increasing weight loss. Her appetite is poor. She has reduced energy. She denies syncope. She notes anorexia. She denies any change in her bowel or bladder habit. No nausea or vomiting. Allergies  Allergen Reactions  . Naproxen     REACTION: gastritis     Current Outpatient Prescriptions  Medication Sig Dispense Refill  . acetaminophen (TYLENOL) 650 MG CR tablet Take 1 tablet (650 mg total) by mouth every 8 (eight) hours as needed.  60 tablet  0  . aspirin 325 MG tablet Take 325 mg by mouth daily.      [name] atorvastatin (LIPITOR) 40 MG tablet Take 1 tablet (40 mg total) by mouth daily.  30 tablet  6  . carvedilol (COREG) 3.125 MG tablet Take 1 tablet (3.125 mg total) by mouth 2 (two) times daily with a meal.  60 tablet  6  . digoxin (LANOXIN) 0.125 MG tablet Take 1 tablet (125 mcg total) by mouth daily.  30 tablet  6  . docusate sodium (COLACE) 100 MG capsule Take 1 capsule (100 mg total) by mouth 2 (two) times daily.  60 capsule  4  . furosemide (LASIX) 20 MG tablet 20 mg. Takes 1/2 tablet daily.      . meloxicam (MOBIC) 7.5 MG tablet Take 1 tablet (7.5 mg total) by mouth daily.  30 tablet  0  . metFORMIN (GLUMETZA) 500 MG (MOD) 24 hr tablet Take 1 tablet (500 mg total) by mouth 2 (two) times daily with a meal.  60 tablet  6  . mirtazapine (REMERON) 15 MG tablet Take 1 tablet (15 mg total) by mouth at bedtime.  30 tablet  11  . nicotine (NICODERM CQ - DOSED IN MG/24 HOURS) 14 mg/24hr patch Place 1 patch onto the skin as needed.       . nitroGLYCERIN (NITROSTAT) 0.4 MG SL tablet Place 0.4 mg under the tongue every 5 (five) minutes as needed.        . NON FORMULARY Stool softner every other day.      . ramipril  (ALTACE) 5 MG capsule Take 1 capsule (5 mg total) by mouth daily.  30 capsule  12  . DISCONTD: furosemide (LASIX) 20 MG tablet Take 0.5 tablets (10 mg total) by mouth daily.  30 tablet  6     Past Medical History  Diagnosis Date  . Coronary artery disease   . MI (myocardial infarction)   . PVD (peripheral vascular disease)   . History of atrial flutter   . Tachy-brady syndrome   . Paroxysmal atrial fibrillation   . COPD (chronic obstructive pulmonary disease)   . Hyperlipidemia   . Diabetes mellitus   . Diastolic congestive heart failure   . Arthritis   . Chronic kidney disease     ROS:   All systems reviewed and negative except as noted in the HPI.   Past Surgical History  Procedure Date  . Cardiac catheterization   . Insert / replace / remove pacemaker     St. Jude  . Cholecystectomy   . Total knee arthroplasty     bilateral  . Bilateral hip arthroscopy   . 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 Myocardial perfusion study  EF 59% 4/04  . Adenosine myoview study     abn EF 53% 12/04/04  . Carotid doppler 2/99  . Echo with lvh 1/98    old records  . Stress cardiolite 3/04    EF 53%  . Gamma knife radiosurg 08/25/06    [facility]  . Pvd     with stent in the right iliac      Family History  Problem Relation Age of Onset  . Cancer Neg Hx   . Depression      ?  . Diabetes      DM and HBP- family hx     History   Social History  . Marital Status: Widowed    Spouse Name: N/A    Number of Children: N/A  . Years of Education: N/A   Occupational History  . Not on file.   Social History Main Topics  . Smoking status: Former Smoker -- 0.5 packs/day for 0 years    Types: Cigarettes    Quit date: 02/22/2011  . Smokeless tobacco: Never Used  . Alcohol Use: No  . Drug Use: No  . Sexually Active:    Other Topics Concern  . Not on file   Social History Narrative   Widowed, husband died at age 92-DM, HTN. 3  children. Nursing assistant in past, active in church. Son lives with her and does most of the housework and cooking. 1/10Pt signed party release form and gives Zane Herald, granddaughter [phone]), access to medical records. Can leave msg on machine [phone]) or cell.      BP 142/62  Pulse 69  Ht 5\' 5"  (1.651 m)  Wt 124 lb (56.246 kg)  BMI 20.63 kg/m2  Physical Exam:  Chronically ill appearing elderly woman, NAD HEENT: Unremarkable Neck:  No JVD, no thyromegally Lungs:  Clear with no wheezes, rales, or rhonchi. HEART:  Regular rate rhythm, no murmurs, no rubs, no clicks Abd:  soft, positive bowel sounds, no organomegally, no rebound, no guarding Ext:  2 plus pulses, no edema, no cyanosis, no clubbing Skin:  No rashes no nodules Neuro:  CN II through XII intact, motor grossly intact  EKG Normal sinus rhythm with nonspecific T wave abnormality DEVICE  Normal device function.  See PaceArt for details. Atrial fibrillation is present less than 1% of the time.  Assess/Plan:

## 2011-11-03 LAB — CBC WITH DIFFERENTIAL/PLATELET
Basos: 0 % (ref 0–3)
Eos: 2 % (ref 0–7)
HCT: 36.7 % (ref 34.0–46.6)
Hemoglobin: 12.4 g/dL (ref 11.1–15.9)
Immature Granulocytes: 0 % (ref 0–2)
Lymphocytes Absolute: 1.7 10*3/uL (ref 0.7–4.5)
MCV: 94 fL (ref 79–97)
Monocytes Absolute: 0.6 10*3/uL (ref 0.1–1.0)
Monocytes: 7 % (ref 4–13)
Neutrophils Absolute: 6 10*3/uL (ref 1.8–7.8)
RBC: 3.89 x10E6/uL (ref 3.77–5.28)
WBC: 8.5 10*3/uL (ref 4.0–10.5)

## 2011-11-03 LAB — BASIC METABOLIC PANEL
BUN/Creatinine Ratio: 14 (ref 11–26)
BUN: 20 mg/dL (ref 8–27)
Chloride: 102 mmol/L (ref 97–108)
Creatinine, Ser: 1.39 mg/dL — ABNORMAL HIGH (ref 0.57–1.00)
GFR calc Af Amer: 40 mL/min/{1.73_m2} — ABNORMAL LOW (ref >59)
GFR calc non Af Amer: 35 mL/min/{1.73_m2} — ABNORMAL LOW (ref >59)
Glucose: 104 mg/dL — ABNORMAL HIGH (ref 65–99)

## 2011-11-03 LAB — HEPATIC FUNCTION PANEL
Albumin: 3.9 g/dL (ref 3.5–4.7)
Alkaline Phosphatase: 87 IU/L (ref 25–165)
Total Bilirubin: 0.3 mg/dL (ref 0.0–1.2)
Total Protein: 6.6 g/dL (ref 6.0–8.5)

## 2011-11-05 ENCOUNTER — Telehealth: Payer: Self-pay

## 2011-11-05 NOTE — Telephone Encounter (Signed)
Pts granddaughter has form needs to be filled out when pt received pacemaker. To call cardiologist office.

## 2011-11-08 ENCOUNTER — Ambulatory Visit: Payer: Medicare Other | Admitting: Family Medicine

## 2011-11-09 ENCOUNTER — Ambulatory Visit (INDEPENDENT_AMBULATORY_CARE_PROVIDER_SITE_OTHER): Payer: Medicare Other | Admitting: Family Medicine

## 2011-11-09 ENCOUNTER — Encounter: Payer: Self-pay | Admitting: Family Medicine

## 2011-11-09 VITALS — BP 140/64 | HR 60 | Temp 97.4°F | Wt 128.0 lb

## 2011-11-09 DIAGNOSIS — R634 Abnormal weight loss: Secondary | ICD-10-CM

## 2011-11-09 DIAGNOSIS — I509 Heart failure, unspecified: Secondary | ICD-10-CM

## 2011-11-09 MED ORDER — MIRTAZAPINE 30 MG PO TABS: 30.0000 mg | ORAL_TABLET | Freq: Every day | ORAL | Status: DC

## 2011-11-09 NOTE — Patient Instructions (Signed)
Good to see you. We are increasing your remeron to 30 mg at night. We are checking your blood work today.

## 2011-11-09 NOTE — Progress Notes (Signed)
HPI [name] is a very pleasant 78 yo female here to discuss persistent weight loss. This has been unfortunately on ongoing issue.  Initially, family felt she may have been depressed. Started Remeron 15 mg nightly last month to see if we could stimulate appetite and help with depressive symptoms. She feels it has not helped much, still not sleeping well.  Saw Dr. Ladona Ridgel earlier this month and he was appropriately also concerned about her continued weight loss.   CXR he ordered showed no acute process.  She is taking digoxin which he mentioned could be stopped if felt to be contributing to her poor appetite.   Her appetite is poor. She has reduced energy. She denies syncope. She notes anorexia. She denies any change in her bowel or bladder habit. No nausea or vomiting.  Wt Readings from Last 3 Encounters:  11/09/11 128 lb (58.06 kg)  11/02/11 124 lb (56.246 kg)  05/31/11 154 lb 4 oz (69.967 kg)   Has actually gained 4 pounds since 6/16 but overall, has lost approx 30 pounds in last 16 months.  Lab Results  Component Value Date   WBC 8.5 11/02/2011   HGB 12.4 11/02/2011   HCT 36.7 11/02/2011   MCV 94 11/02/2011   PLT 364 10/31/2010   Lab Results  Component Value Date   TSH 2.140 11/02/2011   Lab Results  Component Value Date   CREATININE 1.39* 11/02/2011    Allergies  Allergen Reactions  . Naproxen     REACTION: gastritis     Current Outpatient Prescriptions  Medication Sig Dispense Refill  . acetaminophen (TYLENOL) 650 MG CR tablet Take 1 tablet (650 mg total) by mouth every 8 (eight) hours as needed.  60 tablet  0  . aspirin 325 MG tablet Take 325 mg by mouth daily.      [name] atorvastatin (LIPITOR) 40 MG tablet Take 1 tablet (40 mg total) by mouth daily.  30 tablet  6  . carvedilol (COREG) 3.125 MG tablet Take 1 tablet (3.125 mg total) by mouth 2 (two) times daily with a meal.  60 tablet  6  . digoxin (LANOXIN) 0.125 MG tablet Take 1 tablet (125 mcg total) by mouth daily.   30 tablet  6  . docusate sodium (COLACE) 100 MG capsule Take 1 capsule (100 mg total) by mouth 2 (two) times daily.  60 capsule  4  . furosemide (LASIX) 20 MG tablet 20 mg. Takes 1/2 tablet daily.      . meloxicam (MOBIC) 7.5 MG tablet Take 1 tablet (7.5 mg total) by mouth daily.  30 tablet  0  . metFORMIN (GLUMETZA) 500 MG (MOD) 24 hr tablet Take 1 tablet (500 mg total) by mouth 2 (two) times daily with a meal.  60 tablet  6  . mirtazapine (REMERON) 15 MG tablet Take 1 tablet (15 mg total) by mouth at bedtime.  30 tablet  11  . nicotine (NICODERM CQ - DOSED IN MG/24 HOURS) 14 mg/24hr patch Place 1 patch onto the skin as needed.       . nitroGLYCERIN (NITROSTAT) 0.4 MG SL tablet Place 0.4 mg under the tongue every 5 (five) minutes as needed.        . NON FORMULARY Stool softner every other day.      . ramipril (ALTACE) 5 MG capsule Take 1 capsule (5 mg total) by mouth daily.  30 capsule  12     Past Medical History  Diagnosis Date  . Coronary artery  disease   . MI (myocardial infarction)   . PVD (peripheral vascular disease)   . History of atrial flutter   . Tachy-brady syndrome   . Paroxysmal atrial fibrillation   . COPD (chronic obstructive pulmonary disease)   . Hyperlipidemia   . Diabetes mellitus   . Diastolic congestive heart failure   . Arthritis   . Chronic kidney disease     ROS:   All systems reviewed and negative except as noted in the HPI.   Past Surgical History  Procedure Date  . Cardiac catheterization   . Insert / replace / remove pacemaker     St. Jude  . Cholecystectomy   . Total knee arthroplasty     bilateral  . Bilateral hip arthroscopy   . 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 Myocardial perfusion study     EF 59% 4/04  . Adenosine myoview study     abn EF 53% 12/04/04  . Carotid doppler 2/99  . Echo with lvh 1/98    old records  . Stress cardiolite 3/04    EF 53%  . Gamma knife radiosurg 08/25/06     [facility]  . Pvd     with stent in the right iliac      Family History  Problem Relation Age of Onset  . Cancer Neg Hx   . Depression      ?  . Diabetes      DM and HBP- family hx     History   Social History  . Marital Status: Widowed    Spouse Name: N/A    Number of Children: N/A  . Years of Education: N/A   Occupational History  . Not on file.   Social History Main Topics  . Smoking status: Former Smoker -- 0.5 packs/day for 0 years    Types: Cigarettes    Quit date: 02/22/2011  . Smokeless tobacco: Never Used  . Alcohol Use: No  . Drug Use: No  . Sexually Active:    Other Topics Concern  . Not on file   Social History Narrative   Widowed, husband died at age 66-DM, HTN. 3 children. Nursing assistant in past, active in church. Son lives with her and does most of the housework and cooking. 1/10Pt signed party release form and gives Zane Herald, granddaughter [phone]), access to medical records. Can leave msg on machine [phone]) or cell.       Physical Exam: BP 140/64  Pulse 60  Temp 97.4 F (36.3 C) (Oral)  Wt 128 lb (58.06 kg) Wt Readings from Last 3 Encounters:  11/09/11 128 lb (58.06 kg)  11/02/11 124 lb (56.246 kg)  05/31/11 154 lb 4 oz (69.967 kg)    Chronically ill appearing elderly woman, NAD HEENT: Unremarkable Neck:  No JVD, no thyromegally Lungs:  Clear with no wheezes, rales, or rhonchi. HEART:  Regular rate rhythm, no murmurs, no rubs, no clicks Abd:  soft, positive bowel sounds, no organomegally, no rebound, no guarding Ext:  2 plus pulses, no edema, no cyanosis, no clubbing Skin:  No rashes no nodules Neuro:  CN II through XII intact, motor grossly intact    Assessment and Plan: 1. Weight loss  Digoxin level, Comprehensive metabolic panel  Persistent- Likely multifactorial but so far, lab work and xrays neg. Pt does not want to stop digoxin.  I will check dig level today to make sure that she is not overdosed for some  reason. Has been on this for years so I doubt that is the issue. Will increase remeron to 30 mg qhs. She will follow up in 1 month.

## 2011-11-09 NOTE — Addendum Note (Signed)
Addended by: Alvina Chou on: 11/09/2011 02:38 PM   Modules accepted: Orders

## 2011-11-25 ENCOUNTER — Other Ambulatory Visit: Payer: Self-pay

## 2011-11-25 ENCOUNTER — Telehealth: Payer: Self-pay | Admitting: *Deleted

## 2011-11-25 MED ORDER — MELOXICAM 7.5 MG PO TABS: 7.5000 mg | ORAL_TABLET | Freq: Every day | ORAL | Status: DC

## 2011-11-25 NOTE — Telephone Encounter (Signed)
Pt needs order for depends, please call when ready.

## 2011-11-25 NOTE — Telephone Encounter (Signed)
[facility] request refill Meloxicam. Last filled 10/13/11.Please advise.

## 2011-11-26 MED ORDER — CONTROL PADS EXTRA ABSORBENCY MISC: Status: DC

## 2011-11-26 NOTE — Telephone Encounter (Signed)
Advised family member that order is ready for pick up. Order placed up front.

## 2011-11-26 NOTE — Telephone Encounter (Signed)
Rx printed

## 2011-12-01 ENCOUNTER — Telehealth: Payer: Self-pay

## 2011-12-01 NOTE — Telephone Encounter (Signed)
Pts dtr calling to ask if form has been signed by Dr. Ladona Ridgel.   I called Tresa Endo in [place] to see if he has signed this yet.  He has not signed.  As soon as he does we will fax to [phone] Will call Janae back at [phone] when complete.

## 2011-12-03 ENCOUNTER — Telehealth: Payer: Self-pay | Admitting: Internal Medicine

## 2011-12-03 NOTE — Telephone Encounter (Signed)
[name] and Dr. Ladona Ridgel have this form and needs to be addressed by your office.

## 2011-12-03 NOTE — Telephone Encounter (Signed)
Will forward to [place]  

## 2011-12-03 NOTE — Telephone Encounter (Signed)
Fu call Pt's daughter called to check status of paperwork

## 2011-12-03 NOTE — Telephone Encounter (Signed)
Will forward to [place].

## 2011-12-06 NOTE — Telephone Encounter (Signed)
Dr Ladona Ridgel has still not signed and is out all week.

## 2011-12-06 NOTE — Telephone Encounter (Signed)
close

## 2011-12-07 NOTE — Telephone Encounter (Signed)
Forms signed and faxed to daughter.  Original sent to [facility]

## 2011-12-07 NOTE — Telephone Encounter (Signed)
Dennis Bast, RN 12/07/2011 8:31 AM Signed  Spoke with [name] and let her know the paperwork is on Dr Lubertha Basque cart to sign I will try and get him to sign Thursday and fax to her Dennis Bast, RN 12/07/2011 8:26 AM Signed  [name]'s dob 2023/11/04 Dennis Bast, RN 12/07/2011 8:25 AM Signed  A form was sent to [facility] office regarding [name], her grandmother. Please fax to her at [phone] when complete Dennis Bast, RN 12/06/2011 5:10 PM Signed  Called and got no answer or voicemail [name] 12/06/2011 4:43 PM Signed  Patient would like return call from nurse [name]., she can be reached at [phone]  Patient said she left msg on Friday, which I did not see under view encounters. She would like to speak with [facility] concerning paperwork

## 2011-12-13 ENCOUNTER — Telehealth: Payer: Self-pay | Admitting: Internal Medicine

## 2011-12-13 NOTE — Telephone Encounter (Signed)
New msg Pt's daughter calling about paperwork Dr Ladona Ridgel was to fill out. Please call

## 2011-12-14 NOTE — Telephone Encounter (Signed)
Informed that I faxed this 7/26. Dtr says she hasn't received this yet. I told her I would fax again and also mail original form to pt. Understanding verb.

## 2012-01-03 ENCOUNTER — Other Ambulatory Visit: Payer: Self-pay | Admitting: *Deleted

## 2012-01-03 MED ORDER — MELOXICAM 7.5 MG PO TABS: 7.5000 mg | ORAL_TABLET | Freq: Every day | ORAL | Status: DC

## 2012-01-03 MED ORDER — DIGOXIN 125 MCG PO TABS: 125.0000 ug | ORAL_TABLET | Freq: Every day | ORAL | Status: DC

## 2012-01-23 ENCOUNTER — Inpatient Hospital Stay (HOSPITAL_COMMUNITY)
Admission: EM | Admit: 2012-01-23 | Discharge: 2012-01-27 | DRG: 394 | Disposition: A | Discharge status: Home / Self Care | Payer: Medicare Other | Attending: Internal Medicine | Admitting: Internal Medicine

## 2012-01-23 ENCOUNTER — Emergency Department (HOSPITAL_COMMUNITY): Payer: Medicare Other

## 2012-01-23 ENCOUNTER — Encounter (HOSPITAL_COMMUNITY): Payer: Self-pay | Admitting: Emergency Medicine

## 2012-01-23 DIAGNOSIS — R1011 Right upper quadrant pain: Secondary | ICD-10-CM

## 2012-01-23 DIAGNOSIS — N189 Chronic kidney disease, unspecified: Secondary | ICD-10-CM | POA: Diagnosis present

## 2012-01-23 DIAGNOSIS — R599 Enlarged lymph nodes, unspecified: Secondary | ICD-10-CM

## 2012-01-23 DIAGNOSIS — I509 Heart failure, unspecified: Secondary | ICD-10-CM | POA: Diagnosis present

## 2012-01-23 DIAGNOSIS — R197 Diarrhea, unspecified: Secondary | ICD-10-CM

## 2012-01-23 DIAGNOSIS — R5381 Other malaise: Secondary | ICD-10-CM

## 2012-01-23 DIAGNOSIS — E278 Other specified disorders of adrenal gland: Secondary | ICD-10-CM

## 2012-01-23 DIAGNOSIS — I70219 Atherosclerosis of native arteries of extremities with intermittent claudication, unspecified extremity: Secondary | ICD-10-CM

## 2012-01-23 DIAGNOSIS — M169 Osteoarthritis of hip, unspecified: Secondary | ICD-10-CM

## 2012-01-23 DIAGNOSIS — I4891 Unspecified atrial fibrillation: Secondary | ICD-10-CM | POA: Diagnosis present

## 2012-01-23 DIAGNOSIS — F4322 Adjustment disorder with anxiety: Secondary | ICD-10-CM

## 2012-01-23 DIAGNOSIS — I129 Hypertensive chronic kidney disease with stage 1 through stage 4 chronic kidney disease, or unspecified chronic kidney disease: Secondary | ICD-10-CM | POA: Diagnosis not present

## 2012-01-23 DIAGNOSIS — R0989 Other specified symptoms and signs involving the circulatory and respiratory systems: Secondary | ICD-10-CM

## 2012-01-23 DIAGNOSIS — K5730 Diverticulosis of large intestine without perforation or abscess without bleeding: Secondary | ICD-10-CM

## 2012-01-23 DIAGNOSIS — E441 Mild protein-calorie malnutrition: Secondary | ICD-10-CM

## 2012-01-23 DIAGNOSIS — M129 Arthropathy, unspecified: Secondary | ICD-10-CM | POA: Diagnosis present

## 2012-01-23 DIAGNOSIS — Z7982 Long term (current) use of aspirin: Secondary | ICD-10-CM

## 2012-01-23 DIAGNOSIS — I10 Essential (primary) hypertension: Secondary | ICD-10-CM

## 2012-01-23 DIAGNOSIS — I252 Old myocardial infarction: Secondary | ICD-10-CM

## 2012-01-23 DIAGNOSIS — E7800 Pure hypercholesterolemia, unspecified: Secondary | ICD-10-CM

## 2012-01-23 DIAGNOSIS — E119 Type 2 diabetes mellitus without complications: Secondary | ICD-10-CM | POA: Diagnosis present

## 2012-01-23 DIAGNOSIS — K529 Noninfective gastroenteritis and colitis, unspecified: Secondary | ICD-10-CM

## 2012-01-23 DIAGNOSIS — E785 Hyperlipidemia, unspecified: Secondary | ICD-10-CM

## 2012-01-23 DIAGNOSIS — I2510 Atherosclerotic heart disease of native coronary artery without angina pectoris: Secondary | ICD-10-CM | POA: Diagnosis present

## 2012-01-23 DIAGNOSIS — R0789 Other chest pain: Secondary | ICD-10-CM

## 2012-01-23 DIAGNOSIS — D649 Anemia, unspecified: Secondary | ICD-10-CM | POA: Diagnosis not present

## 2012-01-23 DIAGNOSIS — D72829 Elevated white blood cell count, unspecified: Secondary | ICD-10-CM | POA: Diagnosis present

## 2012-01-23 DIAGNOSIS — M1990 Unspecified osteoarthritis, unspecified site: Secondary | ICD-10-CM

## 2012-01-23 DIAGNOSIS — J189 Pneumonia, unspecified organism: Secondary | ICD-10-CM

## 2012-01-23 DIAGNOSIS — N289 Disorder of kidney and ureter, unspecified: Secondary | ICD-10-CM

## 2012-01-23 DIAGNOSIS — M25569 Pain in unspecified knee: Secondary | ICD-10-CM

## 2012-01-23 DIAGNOSIS — R933 Abnormal findings on diagnostic imaging of other parts of digestive tract: Secondary | ICD-10-CM

## 2012-01-23 DIAGNOSIS — Z9861 Coronary angioplasty status: Secondary | ICD-10-CM

## 2012-01-23 DIAGNOSIS — Z87891 Personal history of nicotine dependence: Secondary | ICD-10-CM

## 2012-01-23 DIAGNOSIS — F411 Generalized anxiety disorder: Secondary | ICD-10-CM | POA: Diagnosis present

## 2012-01-23 DIAGNOSIS — R42 Dizziness and giddiness: Secondary | ICD-10-CM

## 2012-01-23 DIAGNOSIS — I5032 Chronic diastolic (congestive) heart failure: Secondary | ICD-10-CM | POA: Diagnosis present

## 2012-01-23 DIAGNOSIS — K559 Vascular disorder of intestine, unspecified: Principal | ICD-10-CM | POA: Diagnosis present

## 2012-01-23 DIAGNOSIS — Z79899 Other long term (current) drug therapy: Secondary | ICD-10-CM

## 2012-01-23 DIAGNOSIS — R109 Unspecified abdominal pain: Secondary | ICD-10-CM

## 2012-01-23 DIAGNOSIS — F329 Major depressive disorder, single episode, unspecified: Secondary | ICD-10-CM | POA: Diagnosis present

## 2012-01-23 DIAGNOSIS — R634 Abnormal weight loss: Secondary | ICD-10-CM | POA: Diagnosis present

## 2012-01-23 DIAGNOSIS — I739 Peripheral vascular disease, unspecified: Secondary | ICD-10-CM

## 2012-01-23 DIAGNOSIS — Z950 Presence of cardiac pacemaker: Secondary | ICD-10-CM

## 2012-01-23 DIAGNOSIS — J9600 Acute respiratory failure, unspecified whether with hypoxia or hypercapnia: Secondary | ICD-10-CM

## 2012-01-23 DIAGNOSIS — N179 Acute kidney failure, unspecified: Secondary | ICD-10-CM | POA: Diagnosis present

## 2012-01-23 DIAGNOSIS — F17200 Nicotine dependence, unspecified, uncomplicated: Secondary | ICD-10-CM

## 2012-01-23 DIAGNOSIS — R5383 Other fatigue: Secondary | ICD-10-CM

## 2012-01-23 DIAGNOSIS — J449 Chronic obstructive pulmonary disease, unspecified: Secondary | ICD-10-CM

## 2012-01-23 DIAGNOSIS — Z008 Encounter for other general examination: Secondary | ICD-10-CM

## 2012-01-23 DIAGNOSIS — M25519 Pain in unspecified shoulder: Secondary | ICD-10-CM

## 2012-01-23 DIAGNOSIS — J4489 Other specified chronic obstructive pulmonary disease: Secondary | ICD-10-CM | POA: Diagnosis present

## 2012-01-23 DIAGNOSIS — D353 Benign neoplasm of craniopharyngeal duct: Secondary | ICD-10-CM

## 2012-01-23 DIAGNOSIS — R1084 Generalized abdominal pain: Secondary | ICD-10-CM

## 2012-01-23 DIAGNOSIS — F3289 Other specified depressive episodes: Secondary | ICD-10-CM | POA: Diagnosis present

## 2012-01-23 DIAGNOSIS — R809 Proteinuria, unspecified: Secondary | ICD-10-CM

## 2012-01-23 DIAGNOSIS — I70209 Unspecified atherosclerosis of native arteries of extremities, unspecified extremity: Secondary | ICD-10-CM | POA: Diagnosis present

## 2012-01-23 HISTORY — DX: Disorder of arteries and arterioles, unspecified: I77.9

## 2012-01-23 HISTORY — DX: Anxiety disorder, unspecified: F41.9

## 2012-01-23 HISTORY — DX: Peripheral vascular disease, unspecified: I73.9

## 2012-01-23 HISTORY — DX: Pneumonia, unspecified organism: J18.9

## 2012-01-23 HISTORY — DX: Sick sinus syndrome: I49.5

## 2012-01-23 LAB — CBC WITH DIFFERENTIAL/PLATELET
Basophils Absolute: 0 10*3/uL (ref 0.0–0.1)
Basophils Relative: 0 % (ref 0–1)
Eosinophils Absolute: 0 10*3/uL (ref 0.0–0.7)
Eosinophils Relative: 0 % (ref 0–5)
HCT: 41.3 % (ref 36.0–46.0)
Hemoglobin: 14 g/dL (ref 12.0–15.0)
MCH: 32.3 pg (ref 26.0–34.0)
MCHC: 33.9 g/dL (ref 30.0–36.0)
MCV: 95.4 fL (ref 78.0–100.0)
Monocytes Absolute: 1 10*3/uL (ref 0.1–1.0)
Monocytes Relative: 6 % (ref 3–12)
RDW: 15 % (ref 11.5–15.5)

## 2012-01-23 LAB — URINE MICROSCOPIC-ADD ON

## 2012-01-23 LAB — URINALYSIS, ROUTINE W REFLEX MICROSCOPIC
Bilirubin Urine: NEGATIVE
Specific Gravity, Urine: 1.009 (ref 1.005–1.030)
Urobilinogen, UA: 0.2 mg/dL (ref 0.0–1.0)

## 2012-01-23 LAB — COMPREHENSIVE METABOLIC PANEL
ALT: 10 U/L (ref 0–35)
Alkaline Phosphatase: 103 U/L (ref 39–117)
Chloride: 100 mEq/L (ref 96–112)
GFR calc Af Amer: 28 mL/min — ABNORMAL LOW (ref >90)
GFR calc non Af Amer: 24 mL/min — ABNORMAL LOW (ref >90)
Glucose, Bld: 112 mg/dL — ABNORMAL HIGH (ref 70–99)
Total Protein: 7.2 g/dL (ref 6.0–8.3)

## 2012-01-23 LAB — GLUCOSE, CAPILLARY: Glucose-Capillary: 100 mg/dL — ABNORMAL HIGH (ref 70–99)

## 2012-01-23 MED ORDER — ACETAMINOPHEN 325 MG PO TABS: 650.0000 mg | ORAL_TABLET | Freq: Four times a day (QID) | ORAL | Status: DC | PRN

## 2012-01-23 MED ORDER — METFORMIN HCL ER 500 MG PO TB24: 500.0000 mg | ORAL_TABLET | Freq: Two times a day (BID) | ORAL | Status: DC

## 2012-01-23 MED ORDER — DIGOXIN 125 MCG PO TABS
125.0000 ug | ORAL_TABLET | Freq: Every day | ORAL | Status: DC
  Administered 2012-01-23 – 2012-01-27 (×5): 125 ug via ORAL
  Filled 2012-01-23 (×5): qty 1

## 2012-01-23 MED ORDER — ONDANSETRON HCL 4 MG/2ML IJ SOLN: 4.0000 mg | Freq: Four times a day (QID) | INTRAMUSCULAR | Status: DC | PRN

## 2012-01-23 MED ORDER — SODIUM CHLORIDE 0.9 % IV SOLN
INTRAVENOUS | Status: DC
  Administered 2012-01-23 – 2012-01-25 (×4): via INTRAVENOUS

## 2012-01-23 MED ORDER — MIRTAZAPINE 30 MG PO TABS
30.0000 mg | ORAL_TABLET | Freq: Every day | ORAL | Status: DC
  Administered 2012-01-23 – 2012-01-27 (×5): 30 mg via ORAL
  Filled 2012-01-23 (×5): qty 1

## 2012-01-23 MED ORDER — MORPHINE SULFATE 2 MG/ML IJ SOLN: 1.0000 mg | INTRAMUSCULAR | Status: DC | PRN

## 2012-01-23 MED ORDER — ONDANSETRON HCL 4 MG PO TABS: 4.0000 mg | ORAL_TABLET | Freq: Four times a day (QID) | ORAL | Status: DC | PRN

## 2012-01-23 MED ORDER — IOHEXOL 300 MG/ML  SOLN
20.0000 mL | INTRAMUSCULAR | Status: DC
  Administered 2012-01-23: 20 mL via ORAL

## 2012-01-23 MED ORDER — SODIUM CHLORIDE 0.9 % IV SOLN
INTRAVENOUS | Status: AC
  Administered 2012-01-23: 11:00:00 via INTRAVENOUS

## 2012-01-23 MED ORDER — DOCUSATE SODIUM 100 MG PO CAPS
100.0000 mg | ORAL_CAPSULE | Freq: Two times a day (BID) | ORAL | Status: DC
  Administered 2012-01-23 – 2012-01-27 (×8): 100 mg via ORAL
  Filled 2012-01-23 (×9): qty 1

## 2012-01-23 MED ORDER — ASPIRIN 325 MG PO TABS
325.0000 mg | ORAL_TABLET | Freq: Every day | ORAL | Status: DC
  Administered 2012-01-23 – 2012-01-27 (×5): 325 mg via ORAL
  Filled 2012-01-23 (×5): qty 1

## 2012-01-23 MED ORDER — CARVEDILOL 3.125 MG PO TABS
3.1250 mg | ORAL_TABLET | Freq: Two times a day (BID) | ORAL | Status: DC
  Administered 2012-01-23 – 2012-01-25 (×4): 3.125 mg via ORAL
  Filled 2012-01-23 (×6): qty 1

## 2012-01-23 MED ORDER — NICOTINE 21 MG/24HR TD PT24
21.0000 mg | MEDICATED_PATCH | Freq: Every day | TRANSDERMAL | Status: DC
  Administered 2012-01-23 – 2012-01-27 (×5): 21 mg via TRANSDERMAL
  Filled 2012-01-23 (×5): qty 1

## 2012-01-23 MED ORDER — RAMIPRIL 5 MG PO CAPS
5.0000 mg | ORAL_CAPSULE | Freq: Every day | ORAL | Status: DC
  Administered 2012-01-23 – 2012-01-24 (×2): 5 mg via ORAL
  Filled 2012-01-23 (×2): qty 1

## 2012-01-23 MED ORDER — HYDROCODONE-ACETAMINOPHEN 5-325 MG PO TABS: 1.0000 | ORAL_TABLET | ORAL | Status: DC | PRN

## 2012-01-23 MED ORDER — SODIUM CHLORIDE 0.9 % IJ SOLN
3.0000 mL | Freq: Two times a day (BID) | INTRAMUSCULAR | Status: DC
  Administered 2012-01-24 – 2012-01-27 (×5): 3 mL via INTRAVENOUS

## 2012-01-23 MED ORDER — ATORVASTATIN CALCIUM 40 MG PO TABS
40.0000 mg | ORAL_TABLET | Freq: Every day | ORAL | Status: DC
  Administered 2012-01-23 – 2012-01-26 (×4): 40 mg via ORAL
  Filled 2012-01-23 (×5): qty 1

## 2012-01-23 MED ORDER — ONDANSETRON HCL 4 MG/2ML IJ SOLN: 4.0000 mg | Freq: Three times a day (TID) | INTRAMUSCULAR | Status: DC | PRN

## 2012-01-23 MED ORDER — NITROGLYCERIN 0.4 MG SL SUBL: 0.4000 mg | SUBLINGUAL_TABLET | SUBLINGUAL | Status: DC | PRN

## 2012-01-23 MED ORDER — ACETAMINOPHEN ER 650 MG PO TBCR: 650.0000 mg | EXTENDED_RELEASE_TABLET | Freq: Three times a day (TID) | ORAL | Status: DC | PRN

## 2012-01-23 NOTE — Progress Notes (Signed)
Protocol for disposition from ED physician Dr. Effie Shy. A 78 year old female with new onset abdominal pain last night and diarrhea. Patient has a history of coronary artery disease, atrial flutter/proximal arch of fibrillation, diabetes and COPD. Patient is hemodynamically stable in sinus rhythm. CT abdomen and pelvis showed Clark wall thickening with suspicion of ischemic colitis. White blood cell count 15,200. Heart rate is controlled, blood pressure is elevated. Patient started on fluids at 75 cc per hour. Chest x-ray consistent with COPD. Patient dispositioned to telemetry floor.

## 2012-01-23 NOTE — Progress Notes (Signed)
Chaplain responded to Order Requisition and checked in with nurse before entering patient's room.  Found patient asleep and advised nurse.  Nurse stated she would wake up patient. Chaplain and nurse entered room together.  Nurse woke up patient and informed her the chaplain she had requested was there to see her. Patient too sleepy to talk...requested a visit another time...maybe tomorrow.  Will request follow-up from chaplain oncall tomorrow.

## 2012-01-23 NOTE — H&P (Signed)
[facility] History and Physical  [name] FAO:[id number] DOB: 1923/06/30 DOA: 01/23/2012  Referring physician: ED physician PCP: Loreen Freud, DO   Chief Complaint: Abdominal pain  HPI:  Pt is 78 yo female who presents with main concern of intermittent episodes of epigastric pain, that initially started several months prior to admission and has been associated with poor oral intake and weight loss of unknown amount. Pt reports that pain is sharp, intermittent in nature, 7/10 in severity when present, non radiating, aggravated by food and with no specific alleviating factors, associated with progressive weight loss. She denies any chest pain, shortness of breath, no urinary concerns.   Assessment and Plan:  Active Problems: Abdominal pain - likely secondary to an ischemic colitis as is suggested by symptoms, rather than the infectious etiology - will continue to monitor pt on telemetry floor and will for now provide supportive care - will continue IVF, antiemetics and analgesia as needed for symptom control - consider GI consult in AM if no improvement  Acute on chronic renal failure - with component of pre renal etiology - will continue IVF for now - BMP in AM  Leukocytosis - secondary to principal problem - CBC in AM  Code Status: Full Family Communication: Pt at bedside Disposition Plan: PT evaluation    Review of Systems:  Constitutional: Negative for fever, chills and malaise/fatigue. Negative for diaphoresis.  HENT: Negative for hearing loss, ear pain, nosebleeds, congestion, sore throat, neck pain, tinnitus and ear discharge.   Eyes: Negative for blurred vision, double vision, photophobia, pain, discharge and redness.  Respiratory: Negative for cough, hemoptysis, sputum production, shortness of breath, wheezing and stridor.   Cardiovascular: Negative for chest pain, palpitations, orthopnea, claudication and leg swelling.  Gastrointestinal: Negative for  nausea, vomiting, positive for abdominal pain. Negative for heartburn, constipation, blood in stool and melena.  Genitourinary: Negative for dysuria, urgency, frequency, hematuria and flank pain.  Musculoskeletal: Negative for myalgias, back pain, joint pain and falls.  Skin: Negative for itching and rash.  Neurological: Negative for dizziness and weakness. Negative for tingling, tremors, sensory change, speech change, focal weakness, loss of consciousness and headaches.  Endo/Heme/Allergies: Negative for environmental allergies and polydipsia. Does not bruise/bleed easily.  Psychiatric/Behavioral: Negative for suicidal ideas. The patient is not nervous/anxious.      Past Medical History  Diagnosis Date  . Coronary artery disease   . MI (myocardial infarction)   . PVD (peripheral vascular disease)   . History of atrial flutter   . Tachy-brady syndrome   . Paroxysmal atrial fibrillation   . COPD (chronic obstructive pulmonary disease)   . Hyperlipidemia   . Diabetes mellitus   . Diastolic congestive heart failure   . Arthritis   . Chronic kidney disease     Past Surgical History  Procedure Date  . Cardiac catheterization   . Insert / replace / remove pacemaker     [employer]  . Cholecystectomy   . Total knee arthroplasty     bilateral  . Bilateral hip arthroscopy   . 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 Myocardial perfusion study     EF 59% 4/04  . Adenosine myoview study     abn EF 53% 12/04/04  . Carotid doppler 2/99  . Echo with lvh 1/98    old records  . Stress cardiolite 3/04    EF 53%  . Gamma knife radiosurg 08/25/06    [facility]  . Pvd  with stent in the right iliac     Social History:  reports that she quit smoking about 11 months ago. Her smoking use included Cigarettes. She smoked .5 packs per day for 0 years. She has never used smokeless tobacco. She reports that she does not drink alcohol or use illicit  drugs.  Allergies  Allergen Reactions  . Naproxen     REACTION: gastritis    Family History  Problem Relation Age of Onset  . Cancer Neg Hx   . Depression      ?  . Diabetes      DM and HBP- family hx    Prior to Admission medications   Medication Sig Start Date End Date Taking? Authorizing Provider  acetaminophen (TYLENOL) 650 MG CR tablet Take 1 tablet (650 mg total) by mouth every 8 (eight) hours as needed. 06/03/11  Yes Dianne Dun, MD  aspirin 325 MG tablet Take 325 mg by mouth daily.   Yes Historical Provider, MD  atorvastatin (LIPITOR) 40 MG tablet Take 1 tablet (40 mg total) by mouth daily. 05/20/11  Yes Dianne Dun, MD  carvedilol (COREG) 3.125 MG tablet Take 1 tablet (3.125 mg total) by mouth 2 (two) times daily with a meal. 05/20/11  Yes Dianne Dun, MD  digoxin (LANOXIN) 0.125 MG tablet Take 1 tablet (125 mcg total) by mouth daily. 01/03/12  Yes Dianne Dun, MD  docusate sodium (COLACE) 100 MG capsule Take 1 capsule (100 mg total) by mouth 2 (two) times daily. 10/13/11  Yes Dianne Dun, MD  furosemide (LASIX) 20 MG tablet Take 10 mg by mouth daily. Takes 1/2 tablet daily. 08/16/11 08/15/12 Yes Dianne Dun, MD  meloxicam (MOBIC) 7.5 MG tablet Take 1 tablet (7.5 mg total) by mouth daily. 01/03/12  Yes Dianne Dun, MD  metFORMIN (GLUMETZA) 500 MG (MOD) 24 hr tablet Take 1 tablet (500 mg total) by mouth 2 (two) times daily with a meal. 07/19/11  Yes Dianne Dun, MD  ramipril (ALTACE) 5 MG capsule Take 1 capsule (5 mg total) by mouth daily. 04/22/11  Yes Dianne Dun, MD  Incontinence Supply Disposable (CONTROL PADS EXTRA ABSORBENCY) MISC Use as directed. 11/26/11   Dianne Dun, MD  mirtazapine (REMERON) 30 MG tablet Take 1 tablet (30 mg total) by mouth at bedtime. 11/09/11   Dianne Dun, MD  nitroGLYCERIN (NITROSTAT) 0.4 MG SL tablet Place 0.4 mg under the tongue every 5 (five) minutes as needed.      Historical Provider, MD    Physical Exam: Filed Vitals:   01/23/12 0834  01/23/12 1042 01/23/12 1414 01/23/12 1426  BP: 148/66 175/72  148/68  Pulse: 84 72  81  Temp:  97.2 F (36.2 C)  98.3 F (36.8 C)  TempSrc:  Oral  Oral  Resp: 18 18  18   Height:  5\' 5"  (1.651 m)    Weight:  58.06 kg (128 lb)    SpO2: 100% 99% 99% 94%    Physical Exam  Constitutional: Appears well-developed and well-nourished. No distress.  HENT: Normocephalic. External right and left ear normal. Oropharynx is clear and moist.  Eyes: Conjunctivae and EOM are normal. PERRLA, no scleral icterus.  Neck: Normal ROM. Neck supple. No JVD. No tracheal deviation. No thyromegaly.  CVS: RRR, S1/S2 +, no murmurs, no gallops, no carotid bruit.  Pulmonary: Effort and breath sounds normal, no stridor, rhonchi, wheezes, rales.  Abdominal: Soft. BS +,  no distension, tenderness, rebound or  guarding.  Musculoskeletal: Normal range of motion. No edema and no tenderness.  Lymphadenopathy: No lymphadenopathy noted, cervical, inguinal. Neuro: Alert. Normal reflexes, muscle tone coordination. No cranial nerve deficit. Skin: Skin is warm and dry. No rash noted. Not diaphoretic. No erythema. No pallor.  Psychiatric: Normal mood and affect. Behavior, judgment, thought content normal.   Labs on Admission:  Basic Metabolic Panel:  Lab 01/23/12 9562  NA 140  K 4.5  CL 100  CO2 29  GLUCOSE 112*  BUN 17  CREATININE 1.83*  CALCIUM 9.4  MG --  PHOS --   Liver Function Tests:  Lab 01/23/12 0118  AST 24  ALT 10  ALKPHOS 103  BILITOT 0.3  PROT 7.2  ALBUMIN 3.9    Lab 01/23/12 0118  LIPASE 11  AMYLASE --   CBC:  Lab 01/23/12 0118  WBC 15.2*  NEUTROABS 13.2*  HGB 14.0  HCT 41.3  MCV 95.4  PLT 326    Radiological Exams on Admission: Ct Abdomen Pelvis Wo Contrast  01/23/2012  *RADIOLOGY REPORT*  Clinical Data:  Abdominal pain, diarrhea, nausea  CT ABDOMEN AND PELVIS WITHOUT CONTRAST  Technique:  Multidetector CT imaging of the abdomen and pelvis was performed following the standard  protocol without intravenous contrast.  Comparison: Abdominal ultrasound performed today, 01/23/2012, most recent prior CT scan of the abdomen and pelvis 02/27/2010  Findings:  Lower Chest:  The pacing lead is identified in the right ventricular apex.  There is caseous calcification of the mitral valve annulus.  The visualized heart is within normal limits for size.  Trace dependent atelectasis.  The lung bases are otherwise clear  Abdomen: Evaluation of the abdominal organs and vasculature is significantly limited in the absence of intravenous contrast material.  Given the underlying limitations, the appearance of the stomach, duodenum, adrenal glands pancreas and spleen are unremarkable.  The spleen is relatively small in size.  A nonspecific 9 mm hypoattenuating lesion in hepatic segment 4B is unchanged from prior and likely reflects a benign hepatic cyst. The gallbladder is surgically absent.  Minimal biliary ductal dilatation, unchanged from prior no hydronephrosis, or nephrolithiasis.  Prominent dromedary hump of the left kidney, unchanged from prior studies dating back to 2011.  Diffuse colonic wall thickening beginning in the region of the splenic flexure and extending down the descending colon to the proximal sigmoid colon. The degree of greatest wall thickening is noted at the splenic flexure and there is surrounding interstitial stranding in the pericolonic fat.  Scattered diverticula are noted the distal descending and sigmoid colon, however there is minimal to no inflammatory stranding around the diverticular to suggest diverticulosis.  Normal-caliber small bowel, no evidence of bowel obstruction.  No ascites, or suspicious adenopathy.  Pelvis: The bladder is distended with urine.  Evaluation the prostate gland limited by streak artifact from bilateral hip prosthesis.  No free fluid, or suspicious adenopathy.  Bones: No acute fracture or aggressive appearing lytic or blastic osseous lesion.   Incompletely imaged bilateral total hip arthroplasty prosthesis.  Evaluation the prosthesis limited by extensive streak artifact. Multilevel degenerative disc disease and advanced lower lumbar facet arthropathy. Levoconvex scoliosis of the lumbar spine centered at the L4.  Vascular: Significantly limited evaluation the absence of intravenous contrast material.  There is extensive calcified atherosclerotic vascular disease with a large mass-like calcification in the abdominal aorta at the level of the celiac origin which occupies at least 60% of the aortic lumen.  Diffuse calcifications involve the celiac artery and its branches,  and the SMA origin. A metallic stent is noted in the proximal right common iliac artery there is focal ectasia of the distal thoracic aorta which measures up to 3 cm at the aortic hiatus.  IMPRESSION:  1.  Marked colonic wall thickening involving the splenic flexure, descending colon and proximal sigmoid colon.  The process is most prominent at the splenic flexure where there is surrounding inflammatory change.  Given the location of the epicenter of the process at the SMA/IMA water shed, and the background of extensive underlying atherosclerotic vascular disease, ischemic colitis is favored.  Additional differential considerations include infectious, and inflammatory colitis.  2.  Extensive atherosclerotic vascular disease as detailed above. Evaluation is limited in the absence of intravenous contrast.  3. Additional ancillary findings as detailed above without significant interval change.   Original Report Authenticated By: Vilma Prader    US Abdomen Complete  01/23/2012  *RADIOLOGY REPORT*  Clinical Data:  Left-sided abdominal pain.  Diarrhea.  COMPLETE ABDOMINAL ULTRASOUND  Comparison:  CT abdomen and pelvis 02/27/2010  Findings:  Gallbladder:  Gallbladder is surgically absent.  Common bile duct:  Normal caliber with measured diameter of 4.5 mm.  Liver:  Normal homogeneous liver parenchymal  echotexture.  Small cystic lesions measuring less than 1 cm diameter corresponding to cysts seen on previous CT.  IVC:  Appears normal.  Pancreas:  Pancreatic duct is upper limits of normal diameter, measuring 1.7 mm.  Diffuse pancreatic atrophy.  Stable since previous CT.  Spleen:  Spleen length measures 4.1 cm.  Normal homogeneous parenchymal echotexture.  Right Kidney:  Right kidney measures 8.6 cm length.  Diffuse parenchymal atrophy is suggested.  No hydronephrosis.  Left Kidney:  Left kidney measures 8.6 cm length.  Diffuse parenchymal atrophy.  No hydronephrosis.  Abdominal aorta:  No aneurysm identified.  IMPRESSION: Surgical absence of the gallbladder.  Small cysts in the liver. Prominent pancreatic duct similar to previous CT study.  No acute changes identified.   Original Report Authenticated By: Marlon Pel, M.D.    Dg Chest Port 1 View  01/23/2012  *RADIOLOGY REPORT*  Clinical Data: Left lower quadrant abdominal pain.  PORTABLE CHEST - 1 VIEW  Comparison: 09/21/2010  Findings: Stable appearance of cardiac pacemaker.  Emphysematous changes and scattered fibrosis in the lungs.  Normal heart size and pulmonary vascularity.  No focal airspace consolidation in the lungs.  No blunting of costophrenic angles.  No pneumothorax. Mediastinal contours appear intact.  Calcification of the aorta. Calcified hilar lymph nodes.  Calcified granulomas in the lungs. Degenerative changes in the spine and shoulders.  Stable appearance since previous study.  IMPRESSION: Emphysematous changes and scattered fibrosis in the lungs.  No evidence of active pulmonary disease.  Stable appearance since previous study.   Original Report Authenticated By: Marlon Pel, M.D.     EKG: Normal sinus rhythm, no ST/T wave changes  Debbora Presto, MD  [facility] Pager [phone]  If 7PM-7AM, please contact night-coverage [email or web address] Password [email or web address] 01/23/2012, 3:59 PM

## 2012-01-23 NOTE — ED Provider Notes (Signed)
History     CSN: [id number]  Arrival date & time 01/23/12  2130   First MD Initiated Contact with Patient 01/23/12 [phone]      Chief Complaint  Patient presents with  . Abdominal Pain    LLQ    (Consider location/radiation/quality/duration/timing/severity/associated sxs/prior treatment) HPI Comments: [name] is a 78 y.o. Female who was reportedly here for left lower quadrant abdominal pain for 3 hours. There is also reported diarrhea. The patient. Cannot give any history. She presents for evaluation by EMS.  Level V Caveat- confusion  The history is provided by the patient.    Past Medical History  Diagnosis Date  . Coronary artery disease   . MI (myocardial infarction)   . PVD (peripheral vascular disease)   . History of atrial flutter   . Tachy-brady syndrome   . Paroxysmal atrial fibrillation   . COPD (chronic obstructive pulmonary disease)   . Hyperlipidemia   . Diabetes mellitus   . Diastolic congestive heart failure   . Arthritis   . Chronic kidney disease     Past Surgical History  Procedure Date  . Cardiac catheterization   . Insert / replace / remove pacemaker     [employer]  . Cholecystectomy   . Total knee arthroplasty     bilateral  . Bilateral hip arthroscopy   . 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 Myocardial perfusion study     EF 59% 4/04  . Adenosine myoview study     abn EF 53% 12/04/04  . Carotid doppler 2/99  . Echo with lvh 1/98    old records  . Stress cardiolite 3/04    EF 53%  . Gamma knife radiosurg 08/25/06    [facility]  . Pvd     with stent in the right iliac     Family History  Problem Relation Age of Onset  . Cancer Neg Hx   . Depression      ?  . Diabetes      DM and HBP- family hx    History  Substance Use Topics  . Smoking status: Former Smoker -- 0.5 packs/day for 0 years    Types: Cigarettes    Quit date: 02/22/2011  . Smokeless tobacco: Never Used  . Alcohol  Use: No    OB History    Grav Para Term Preterm Abortions TAB SAB Ect Mult Living                  Review of Systems  All other systems reviewed and are negative.    Allergies  Naproxen  Home Medications   No current outpatient prescriptions on file.  BP 136/61  Pulse 80  Temp 98.3 F (36.8 C) (Oral)  Resp 18  Ht 5\' 5"  (1.651 m)  Wt 128 lb (58.06 kg)  BMI 21.30 kg/m2  SpO2 94%  Physical Exam  Nursing note and vitals reviewed. Constitutional: She appears well-developed. No distress.       Frail  HENT:  Head: Normocephalic and atraumatic.       Mouth moist  Eyes: Conjunctivae and EOM are normal. Pupils are equal, round, and reactive to light.  Neck: Normal range of motion and phonation normal. Neck supple.  Cardiovascular: Normal rate, regular rhythm and intact distal pulses.   Pulmonary/Chest: Effort normal and breath sounds normal. She exhibits no tenderness.  Abdominal: Soft. She exhibits no distension and no mass. There is tenderness (  Mild left and right upper quadrant tenderness). There is no rebound and no guarding.  Musculoskeletal: Normal range of motion.  Neurological: She is alert. She has normal strength. No cranial nerve deficit. She exhibits normal muscle tone. Coordination normal.  Skin: Skin is warm and dry.  Psychiatric: She has a normal mood and affect. Her behavior is normal.    ED Course  Procedures (including critical care time)    Emergency department treatment: IV fluids.  Reevaluation: 08:20- patient is comfortable, without analgesics. Repeat vital signs are reassuring. The patient's granddaughter, is now with her. She reports several days of diarrhea and preceding anorexia, and weakness, and weight loss, for several months..  Will arrange for admission for treatment and findings on CT     Labs Reviewed  CBC WITH DIFFERENTIAL - Abnormal; Notable for the following:    WBC 15.2 (*)     Neutrophils Relative 87 (*)     Neutro Abs  13.2 (*)     Lymphocytes Relative 7 (*)     All other components within normal limits  COMPREHENSIVE METABOLIC PANEL - Abnormal; Notable for the following:    Glucose, Bld 112 (*)     Creatinine, Ser 1.83 (*)     GFR calc non Af Amer 24 (*)     GFR calc Af Amer 28 (*)     All other components within normal limits  URINALYSIS, ROUTINE W REFLEX MICROSCOPIC - Abnormal; Notable for the following:    APPearance CLOUDY (*)     Hgb urine dipstick TRACE (*)     All other components within normal limits  URINE MICROSCOPIC-ADD ON - Abnormal; Notable for the following:    Bacteria, UA MANY (*)     Casts GRANULAR CAST (*)     All other components within normal limits  LIPASE, BLOOD  URINE CULTURE  BASIC METABOLIC PANEL  CBC   Ct Abdomen Pelvis Wo Contrast  01/23/2012  *RADIOLOGY REPORT*  Clinical Data:  Abdominal pain, diarrhea, nausea  CT ABDOMEN AND PELVIS WITHOUT CONTRAST  Technique:  Multidetector CT imaging of the abdomen and pelvis was performed following the standard protocol without intravenous contrast.  Comparison: Abdominal ultrasound performed today, 01/23/2012, most recent prior CT scan of the abdomen and pelvis 02/27/2010  Findings:  Lower Chest:  The pacing lead is identified in the right ventricular apex.  There is caseous calcification of the mitral valve annulus.  The visualized heart is within normal limits for size.  Trace dependent atelectasis.  The lung bases are otherwise clear  Abdomen: Evaluation of the abdominal organs and vasculature is significantly limited in the absence of intravenous contrast material.  Given the underlying limitations, the appearance of the stomach, duodenum, adrenal glands pancreas and spleen are unremarkable.  The spleen is relatively small in size.  A nonspecific 9 mm hypoattenuating lesion in hepatic segment 4B is unchanged from prior and likely reflects a benign hepatic cyst. The gallbladder is surgically absent.  Minimal biliary ductal dilatation,  unchanged from prior no hydronephrosis, or nephrolithiasis.  Prominent dromedary hump of the left kidney, unchanged from prior studies dating back to 2011.  Diffuse colonic wall thickening beginning in the region of the splenic flexure and extending down the descending colon to the proximal sigmoid colon. The degree of greatest wall thickening is noted at the splenic flexure and there is surrounding interstitial stranding in the pericolonic fat.  Scattered diverticula are noted the distal descending and sigmoid colon, however there is minimal to no  inflammatory stranding around the diverticular to suggest diverticulosis.  Normal-caliber small bowel, no evidence of bowel obstruction.  No ascites, or suspicious adenopathy.  Pelvis: The bladder is distended with urine.  Evaluation the prostate gland limited by streak artifact from bilateral hip prosthesis.  No free fluid, or suspicious adenopathy.  Bones: No acute fracture or aggressive appearing lytic or blastic osseous lesion.  Incompletely imaged bilateral total hip arthroplasty prosthesis.  Evaluation the prosthesis limited by extensive streak artifact. Multilevel degenerative disc disease and advanced lower lumbar facet arthropathy. Levoconvex scoliosis of the lumbar spine centered at the L4.  Vascular: Significantly limited evaluation the absence of intravenous contrast material.  There is extensive calcified atherosclerotic vascular disease with a large mass-like calcification in the abdominal aorta at the level of the celiac origin which occupies at least 60% of the aortic lumen.  Diffuse calcifications involve the celiac artery and its branches, and the SMA origin. A metallic stent is noted in the proximal right common iliac artery there is focal ectasia of the distal thoracic aorta which measures up to 3 cm at the aortic hiatus.  IMPRESSION:  1.  Marked colonic wall thickening involving the splenic flexure, descending colon and proximal sigmoid colon.  The  process is most prominent at the splenic flexure where there is surrounding inflammatory change.  Given the location of the epicenter of the process at the SMA/IMA water shed, and the background of extensive underlying atherosclerotic vascular disease, ischemic colitis is favored.  Additional differential considerations include infectious, and inflammatory colitis.  2.  Extensive atherosclerotic vascular disease as detailed above. Evaluation is limited in the absence of intravenous contrast.  3. Additional ancillary findings as detailed above without significant interval change.   Original Report Authenticated By: Vilma Prader    US Abdomen Complete  01/23/2012  *RADIOLOGY REPORT*  Clinical Data:  Left-sided abdominal pain.  Diarrhea.  COMPLETE ABDOMINAL ULTRASOUND  Comparison:  CT abdomen and pelvis 02/27/2010  Findings:  Gallbladder:  Gallbladder is surgically absent.  Common bile duct:  Normal caliber with measured diameter of 4.5 mm.  Liver:  Normal homogeneous liver parenchymal echotexture.  Small cystic lesions measuring less than 1 cm diameter corresponding to cysts seen on previous CT.  IVC:  Appears normal.  Pancreas:  Pancreatic duct is upper limits of normal diameter, measuring 1.7 mm.  Diffuse pancreatic atrophy.  Stable since previous CT.  Spleen:  Spleen length measures 4.1 cm.  Normal homogeneous parenchymal echotexture.  Right Kidney:  Right kidney measures 8.6 cm length.  Diffuse parenchymal atrophy is suggested.  No hydronephrosis.  Left Kidney:  Left kidney measures 8.6 cm length.  Diffuse parenchymal atrophy.  No hydronephrosis.  Abdominal aorta:  No aneurysm identified.  IMPRESSION: Surgical absence of the gallbladder.  Small cysts in the liver. Prominent pancreatic duct similar to previous CT study.  No acute changes identified.   Original Report Authenticated By: Marlon Pel, M.D.    Dg Chest Port 1 View  01/23/2012  *RADIOLOGY REPORT*  Clinical Data: Left lower quadrant abdominal pain.   PORTABLE CHEST - 1 VIEW  Comparison: 09/21/2010  Findings: Stable appearance of cardiac pacemaker.  Emphysematous changes and scattered fibrosis in the lungs.  Normal heart size and pulmonary vascularity.  No focal airspace consolidation in the lungs.  No blunting of costophrenic angles.  No pneumothorax. Mediastinal contours appear intact.  Calcification of the aorta. Calcified hilar lymph nodes.  Calcified granulomas in the lungs. Degenerative changes in the spine and shoulders.  Stable appearance  since previous study.  IMPRESSION: Emphysematous changes and scattered fibrosis in the lungs.  No evidence of active pulmonary disease.  Stable appearance since previous study.   Original Report Authenticated By: Marlon Pel, M.D.      1. Diarrhea   2. Abdominal pain   3. Colitis   4. Abdominal pain, right upper quadrant   5. Adjustment disorder with anxiety       MDM  Abdominal pain, with findings consistent with colitis, and increased likelihood of ischemic bowel disease, as the cause. No gross infection. However, there is elevated white blood cell count. Patient will need to be admitted for further testing treatment and stabilization    Plan: Admit- [facility]      Flint Melter, MD 01/23/12 [phone]

## 2012-01-23 NOTE — ED Notes (Signed)
Patient currently resting quietly in bed; no respiratory or acute distress noted.  Patient updated on plan of care; informed patient that she is still pending for CT (CT states they need to wait another hour post contrast drinking before patient can go to CT). Patient has no other questions or concerns at this time; will continue to monitor.

## 2012-01-23 NOTE — ED Notes (Signed)
Received bedside report from [place], [place].  Patient currently resting quietly in bed; no respiratory or acute distress noted.  Patient updated on plan of care; informed patient that she will be sent for a CT scan.  CT tech dropped off contrast for patient to drink; patient aware to notify staff when she finishes contrast.  Patient has no other questions or concerns at this time; will continue to monitor.

## 2012-01-23 NOTE — Progress Notes (Signed)
Pt arrived to floor from ED. Alert and oriented. No complaints of pain. VSS - BP elevated, will continue to monitor. Oriented pt to room. Call bell within reach. Bed in low position. Bed alarm on.

## 2012-01-23 NOTE — ED Notes (Signed)
Patient currently sitting up in bed drinking contrast water cups; patient has no other questions or concerns at this time.  Patient still pending for CT at this time; will continue to monitor.

## 2012-01-23 NOTE — ED Notes (Signed)
Patient currently asleep in bed; no respiratory or acute distress noted.  Patient has finished drinking contrast; CT contacted and notified.  Will continue to monitor.

## 2012-01-23 NOTE — ED Notes (Addendum)
Pt called ems c/o LLQ abd pain onset x 3hrs.  Reports diarrhea nausea but denies emesis. Intial BP 72/40 after 250 ml NS ivf  178/102.

## 2012-01-23 NOTE — Evaluation (Signed)
Physical Therapy Evaluation Patient Details Name: [name] MRN: [id number] DOB: 03-22-1924 Today's Date: 01/23/2012 Time: 5284-1324 PT Time Calculation (min): 19 min  PT Assessment / Plan / Recommendation Clinical Impression  Pt admitted with diarrhea, abdominal pain and dehydration. Pt with decreased functional mobility secondary to dehydration and weakness. Pt will benefit from skilled PT in the acute care setting in order to maximize functional mobility and strength to return to PLOF    PT Assessment  Patient needs continued PT services    Follow Up Recommendations  No PT follow up;Supervision for mobility/OOB    Barriers to Discharge        Equipment Recommendations  None recommended by PT    Recommendations for Other Services     Frequency Min 3X/week    Precautions / Restrictions Precautions Precautions: Fall Restrictions Weight Bearing Restrictions: No   Pertinent Vitals/Pain No complaints of pain      Mobility  Bed Mobility Bed Mobility: Supine to Sit;Sitting - Scoot to Edge of Bed;Sit to Supine Supine to Sit: 6: Modified independent (Device/Increase time) Sitting - Scoot to Edge of Bed: 6: Modified independent (Device/Increase time) Sit to Supine: 6: Modified independent (Device/Increase time) Transfers Transfers: Stand to Sit;Sit to Stand Sit to Stand: 4: Min assist;With upper extremity assist;From bed;From chair/3-in-1 Stand to Sit: 4: Min assist;With upper extremity assist;To chair/3-in-1;To bed Details for Transfer Assistance: VC for hand placement for a safe transfer with RW. Increased assistance for anterior translation and controlled descent onto chair Ambulation/Gait Ambulation/Gait Assistance: 4: Min assist Ambulation Distance (Feet): 5 Feet (x 2) Assistive device: Rolling walker Ambulation/Gait Assistance Details: Pt ambulating well although requested to sit secondary to feeling lightheadedness and weak. Pt extremely dehydrated. Gait  Pattern: Step-to pattern;Decreased stride length;Narrow base of support Gait velocity: decreased gait speed    Exercises     PT Diagnosis: Generalized weakness  PT Problem List: Decreased activity tolerance;Decreased strength;Decreased mobility;Decreased knowledge of use of DME;Decreased safety awareness;Decreased knowledge of precautions;Pain PT Treatment Interventions: DME instruction;Gait training;Functional mobility training;Therapeutic activities;Therapeutic exercise;Patient/family education   PT Goals Acute Rehab PT Goals PT Goal Formulation: With patient Time For Goal Achievement: 01/30/12 Potential to Achieve Goals: Good Pt will go Sit to Stand: with modified independence PT Goal: Sit to Stand - Progress: Goal set today Pt will go Stand to Sit: with modified independence PT Goal: Stand to Sit - Progress: Goal set today Pt will Transfer Bed to Chair/Chair to Bed: with supervision PT Transfer Goal: Bed to Chair/Chair to Bed - Progress: Goal set today Pt will Ambulate: 51 - 150 feet;with supervision;with least restrictive assistive device PT Goal: Ambulate - Progress: Goal set today  Visit Information  Last PT Received On: 01/23/12 Assistance Needed: +1    Subjective Data      Prior Functioning  Home Living Lives With: Son Available Help at Discharge: Family;Available 24 hours/day Type of Home: House Home Access: Ramped entrance Home Layout: One level Bathroom Shower/Tub: Tub/shower unit;Curtain Bathroom Toilet: Handicapped height Bathroom Accessibility: Yes How Accessible: Accessible via walker Home Adaptive Equipment: Walker - rolling;Straight cane (scooter) Prior Function Level of Independence: Independent with assistive device(s) Able to Take Stairs?: No Driving: No 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 Dominant Hand: Right    Cognition  Overall Cognitive Status: Appears within functional limits for  tasks assessed/performed Arousal/Alertness: Awake/alert Orientation Level: Appears intact for tasks assessed Behavior During Session: [facility] for tasks performed    Extremity/Trunk Assessment Right Lower Extremity Assessment RLE ROM/Strength/Tone:  Deficits RLE ROM/Strength/Tone Deficits: grossly 4/5 Left Lower Extremity Assessment LLE ROM/Strength/Tone: Deficits LLE ROM/Strength/Tone Deficits: grossly 4/5   Balance    End of Session PT - End of Session Equipment Utilized During Treatment: Gait belt Activity Tolerance: Patient limited by fatigue Patient left: in bed;with call bell/phone within reach;with nursing in room Nurse Communication: Mobility status (pt requests to be cleaned)   Milana Kidney 01/23/2012, 5:49 PM  01/23/2012 Milana Kidney DPT PAGER: [phone] OFFICE: [phone]

## 2012-01-23 NOTE — Progress Notes (Signed)
PHARMACIST - PHYSICIAN COMMUNICATION DR:  Tat CONCERNING:  METFORMIN SAFE ADMINISTRATION POLICY  RECOMMENDATION: Metformin has been placed on DISCONTINUE (rejected order) STATUS and should be reordered only after any of the conditions below are ruled out.  Current safety recommendations include avoiding metformin for a minimum of 48 hours after the patient's exposure to intravenous contrast media.  DESCRIPTION:  The Pharmacy Committee has adopted a policy that restricts the use of metformin in hospitalized patients until all the contraindications to administration have been ruled out. Specific contraindications are: []  Serum creatinine ? 1.5 for males [x]  Serum creatinine ? 1.4 for females []  Shock, acute MI, sepsis, hypoxemia, dehydration []  Planned administration of intravenous iodinated contrast media []  Heart Failure patients with low EF []  Acute or chronic metabolic acidosis (including DKA)   This pt's S.Cr. Is 1.83. Metformin has been discontinued. This pt is also on a 24 hr extended release product that is being given twice a day.  Cardell Peach, PharmD

## 2012-01-24 DIAGNOSIS — J9600 Acute respiratory failure, unspecified whether with hypoxia or hypercapnia: Secondary | ICD-10-CM

## 2012-01-24 DIAGNOSIS — K551 Chronic vascular disorders of intestine: Secondary | ICD-10-CM

## 2012-01-24 LAB — BASIC METABOLIC PANEL
BUN: 12 mg/dL (ref 6–23)
Calcium: 8.1 mg/dL — ABNORMAL LOW (ref 8.4–10.5)
GFR calc non Af Amer: 38 mL/min — ABNORMAL LOW (ref >90)
Glucose, Bld: 69 mg/dL — ABNORMAL LOW (ref 70–99)
Potassium: 3.8 mEq/L (ref 3.5–5.1)

## 2012-01-24 LAB — CBC
Hemoglobin: 11.2 g/dL — ABNORMAL LOW (ref 12.0–15.0)
MCH: 31.8 pg (ref 26.0–34.0)
MCHC: 33.4 g/dL (ref 30.0–36.0)
MCV: 95.2 fL (ref 78.0–100.0)

## 2012-01-24 MED ORDER — ADULT MULTIVITAMIN W/MINERALS CH
1.0000 | ORAL_TABLET | Freq: Every day | ORAL | Status: DC
  Administered 2012-01-24 – 2012-01-27 (×4): 1 via ORAL
  Filled 2012-01-24 (×4): qty 1

## 2012-01-24 MED ORDER — ENSURE PUDDING PO PUDG
1.0000 | Freq: Three times a day (TID) | ORAL | Status: DC
  Administered 2012-01-24 – 2012-01-27 (×4): 1 via ORAL

## 2012-01-24 NOTE — Progress Notes (Signed)
Physical Therapy Treatment Patient Details Name: [name] MRN: [id number] DOB: [birth year] Today's Date: 01/24/2012 Time: 7829-5621 PT Time Calculation (min): 29 min  PT Assessment / Plan / Recommendation Comments on Treatment Session  Excellent progress, with gains in activity tolerance as well; On track for dc home    Follow Up Recommendations  Home health PT    Barriers to Discharge        Equipment Recommendations  None recommended by PT    Recommendations for Other Services    Frequency Min 3X/week   Plan Discharge plan remains appropriate    Precautions / Restrictions Precautions Precautions: Fall   Pertinent Vitals/Pain no apparent distress     Mobility  Transfers Transfers: Stand to Sit;Sit to Stand Sit to Stand: 4: Min guard;From chair/3-in-1;With upper extremity assist Stand to Sit: 4: Min guard;To chair/3-in-1;With armrests Details for Transfer Assistance: VC for hand placement for a safe transfer with RW. Increased assistance for anterior translation and controlled descent onto chair Ambulation/Gait Ambulation/Gait Assistance: 4: Min guard Ambulation Distance (Feet): 340 Feet Assistive device: Rolling walker Ambulation/Gait Assistance Details: No lightheadedness today; Excellent progress with amb Gait Pattern: Step-through pattern    Exercises     PT Diagnosis:    PT Problem List:   PT Treatment Interventions:     PT Goals Acute Rehab PT Goals Time For Goal Achievement: 01/30/12 Potential to Achieve Goals: Good Pt will go Sit to Stand: with modified independence PT Goal: Sit to Stand - Progress: Progressing toward goal Pt will go Stand to Sit: with modified independence PT Goal: Stand to Sit - Progress: Progressing toward goal Pt will Ambulate: 51 - 150 feet;with supervision;with least restrictive assistive device PT Goal: Ambulate - Progress: Progressing toward goal  Visit Information  Last PT Received On: 01/24/12 Assistance Needed:  +1    Subjective Data  Subjective: Agreeable to amb Patient Stated Goal: home   Cognition  Overall Cognitive Status: Appears within functional limits for tasks assessed/performed Arousal/Alertness: Awake/alert Orientation Level: Appears intact for tasks assessed Behavior During Session: [facility] for tasks performed    Balance     End of Session PT - End of Session Activity Tolerance: Patient tolerated treatment well Patient left: in chair;with call bell/phone within reach;with family/visitor present Nurse Communication: Mobility status   GP     [name] [name], [name] [phone]  01/24/2012, 4:59 PM

## 2012-01-24 NOTE — Progress Notes (Signed)
[name] was sitting up and getting her hair braided. During our brief encounter, the physical therapist came. Pt was thanking God that she had received food that day, because she had been on a liquid diet. Since the therapist wanted to take her for a walk, I offered and had prayer with pt.  She was very thankful for the visit.  Marjory Lies, Chaplain

## 2012-01-24 NOTE — Consult Note (Signed)
[facility]   Vascular Consult Note    Patient name: [name] MRN: [id number] DOB: Jul 26, 1923 Sex: female   Referred by: Dr Izola Price  Reason for referral: Mesenteric ischemia  HISTORY OF PRESENT ILLNESS: The patient is an 78 year old female with several year history of weight loss. This is been attributed to several events including possible depression. She does have a long history of diabetes. She was admitted to the hospital with abdominal pain and CT scan revealed thickening of her colon with probable ischemic colitis. She does specifically denies any postprandial pain. She denies abdominal pain prior to this most recent event. She reports that she is not specifically trying to lose weight. She does have extensive past medical history with peripheral vascular occlusive disease and coronary disease. She is status post right common iliac artery angioplasty and stenting in years past.  Past Medical History  Diagnosis Date  . Coronary artery disease   . MI (myocardial infarction)   . PVD (peripheral vascular disease)   . History of atrial flutter   . Tachy-brady syndrome   . Paroxysmal atrial fibrillation   . COPD (chronic obstructive pulmonary disease)   . Hyperlipidemia   . Diabetes mellitus   . Diastolic congestive heart failure   . Arthritis   . Chronic kidney disease     Past Surgical History  Procedure Date  . Cardiac catheterization   . Insert / replace / remove pacemaker     St. Jude  . Cholecystectomy   . Total knee arthroplasty     bilateral  . Bilateral hip arthroscopy   . 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 Myocardial perfusion study     EF 59% 4/04  . Adenosine myoview study     abn EF 53% 12/04/04  . Carotid doppler 2/99  . Echo with lvh 1/98    old records  . Stress cardiolite 3/04    EF 53%  . Gamma knife radiosurg 08/25/06    [facility]  . Pvd     with stent in  the right iliac     History   Social History  . Marital Status: Widowed    Spouse Name: N/A    Number of Children: N/A  . Years of Education: N/A   Occupational History  . Not on file.   Social History Main Topics  . Smoking status: Former Smoker -- 0.5 packs/day for 0 years    Types: Cigarettes    Quit date: 02/22/2011  . Smokeless tobacco: Never Used  . Alcohol Use: No  . Drug Use: No  . Sexually Active:    Other Topics Concern  . Not on file   Social History Narrative   Widowed, husband died at age 60-DM, HTN. 3 children. Nursing assistant in past, active in church. Son lives with her and does most of the housework and cooking. 1/10Pt signed party release form and gives Zane Herald, granddaughter [phone]), access to medical records. Can leave msg on machine [phone]) or cell.     Family History  Problem Relation Age of Onset  . Cancer Neg Hx   . Depression      ?  . Diabetes      DM and HBP- family hx    Allergies  Allergen Reactions  . Naproxen     REACTION: gastritis    Prior to Admission medications   Medication Sig Start  Date End Date Taking? Authorizing Provider  acetaminophen (TYLENOL) 650 MG CR tablet Take 1 tablet (650 mg total) by mouth every 8 (eight) hours as needed. 06/03/11  Yes Dianne Dun, MD  aspirin 325 MG tablet Take 325 mg by mouth daily.   Yes Historical Provider, MD  atorvastatin (LIPITOR) 40 MG tablet Take 1 tablet (40 mg total) by mouth daily. 05/20/11  Yes Dianne Dun, MD  carvedilol (COREG) 3.125 MG tablet Take 1 tablet (3.125 mg total) by mouth 2 (two) times daily with a meal. 05/20/11  Yes Dianne Dun, MD  digoxin (LANOXIN) 0.125 MG tablet Take 1 tablet (125 mcg total) by mouth daily. 01/03/12  Yes Dianne Dun, MD  docusate sodium (COLACE) 100 MG capsule Take 1 capsule (100 mg total) by mouth 2 (two) times daily. 10/13/11  Yes Dianne Dun, MD  furosemide (LASIX) 20 MG tablet Take 10 mg by mouth daily. Takes 1/2 tablet daily. 08/16/11  08/15/12 Yes Dianne Dun, MD  meloxicam (MOBIC) 7.5 MG tablet Take 1 tablet (7.5 mg total) by mouth daily. 01/03/12  Yes Dianne Dun, MD  metFORMIN (GLUMETZA) 500 MG (MOD) 24 hr tablet Take 1 tablet (500 mg total) by mouth 2 (two) times daily with a meal. 07/19/11  Yes Dianne Dun, MD  ramipril (ALTACE) 5 MG capsule Take 1 capsule (5 mg total) by mouth daily. 04/22/11  Yes Dianne Dun, MD  Incontinence Supply Disposable (CONTROL PADS EXTRA ABSORBENCY) MISC Use as directed. 11/26/11   Dianne Dun, MD  mirtazapine (REMERON) 30 MG tablet Take 1 tablet (30 mg total) by mouth at bedtime. 11/09/11   Dianne Dun, MD  nitroGLYCERIN (NITROSTAT) 0.4 MG SL tablet Place 0.4 mg under the tongue every 5 (five) minutes as needed.      Historical Provider, MD     REVIEW OF SYSTEMS: Reviewed in history and physical with nothing to add other than her past medical history  PHYSICAL EXAMINATION:  Filed Vitals:   01/24/12 1000  BP: 162/73  Pulse: 72  Temp: 98 F (36.7 C)  Resp: 18    General: The patient appears their stated age. Pulmonary: There is a good air exchange bilaterally  Abdomen: Soft and non-tender with normal pitch bowel sounds. No tenderness and no distention. Musculoskeletal: There are no major deformities.  There is no significant extremity pain. Neurologic: No focal weakness or paresthesias are detected, Skin: There are no ulcer or rashes noted. Psychiatric: The patient has normal affect. Pulse status: 2+ femoral pulses bilaterally. 2+ left popliteal pulse. Absent right popliteal pulse and absent distal pulses bilaterally. 2+ left brachial pulse and 1-2+ left radial pulse. Absent right brachial and radial pulse.  Diagnostic Studies: Reviewed the CT scan from 01/23/2012 and compared this to October 2011 study. The patient has extensive calcification in her aorta from her diaphragmatic hiatus distally into the iliac vessels. She has a complete circumferential calcification of her infrarenal  aorta. There is extensive calcification which is occluding a significant amount of the lumen at the level of the spare mesenteric artery takeoff on the posterior wall. This does not appear to be dramatically changed from the October 2011 study. There is poor timing of the IV contrast on the current study making the actual flow lumen visualization difficult. She does have extensive calcification occlusive disease in [place] celiac and superior mesenteric arteries.    Medication Changes: None  Assessment:  Chronic mesenteric ischemia with recent bout of probable ischemic  colitis. Documentation of chronic aortic and mesenteric occlusive disease  Plan: I discussed this at length with the patient. I have recommended arteriography for further evaluation and possible treatment. I explained that she would be at significantly high risk for aortic surgery but may have narrowing that is amenable to angioplasty. She is scheduled for diagnostic arteriogram tomorrow and possible angioplasty if indicated. I explained that this will be done by my partner Dr. Myra Gianotti. She does have mild renal insufficiency with creatinine in the 1.35 so therefore a contrast will be limited  Amario Longmore 9/9/20131:26 PM

## 2012-01-24 NOTE — Progress Notes (Signed)
INITIAL ADULT NUTRITION ASSESSMENT Date: 01/24/2012   Time: 10:58 AM  Reason for Assessment: Malnutrition Screening  INTERVENTION: 1. Ensure Complete po BID, each supplement provides 350 kcal and 13 grams of protein. 2. MVI daily 3. RD to continue to follow nutrition care plan  DOCUMENTATION CODES Per approved criteria -Severe malnutrition in the context of chronic illness  ASSESSMENT: Female 78 y.o.  Dx: abdominal pain  Hx:  Past Medical History  Diagnosis Date  . Coronary artery disease   . MI (myocardial infarction)   . PVD (peripheral vascular disease)   . History of atrial flutter   . Tachy-brady syndrome   . Paroxysmal atrial fibrillation   . COPD (chronic obstructive pulmonary disease)   . Hyperlipidemia   . Diabetes mellitus   . Diastolic congestive heart failure   . Arthritis   . Chronic kidney disease    Past Surgical History  Procedure Date  . Cardiac catheterization   . Insert / replace / remove pacemaker     [employer]  . Cholecystectomy   . Total knee arthroplasty     bilateral  . Bilateral hip arthroscopy   . 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 Myocardial perfusion study     EF 59% 4/04  . Adenosine myoview study     abn EF 53% 12/04/04  . Carotid doppler 2/99  . Echo with lvh 1/98    old records  . Stress cardiolite 3/04    EF 53%  . Gamma knife radiosurg 08/25/06    [facility]  . Pvd     with stent in the right iliac    Related Meds:     . sodium chloride   Intravenous STAT  . aspirin  325 mg Oral Daily  . atorvastatin  40 mg Oral q1800  . carvedilol  3.125 mg Oral BID WC  . digoxin  125 mcg Oral Daily  . docusate sodium  100 mg Oral BID  . mirtazapine  30 mg Oral QHS  . nicotine  21 mg Transdermal Daily  . ramipril  5 mg Oral Daily  . sodium chloride  3 mL Intravenous Q12H  . DISCONTD: iohexol  20 mL Oral Q1 Hr x 2  . DISCONTD: metFORMIN  500 mg Oral BID WC   Ht: 5\' 5"  (165.1 cm)  Wt:  128 lb (58.06 kg)  Ideal Wt: 56.8 kg % Ideal Wt: 102%  Wt Readings from Last 15 Encounters:  01/23/12 128 lb (58.06 kg)  11/09/11 128 lb (58.06 kg)  11/02/11 124 lb (56.246 kg)  05/31/11 154 lb 4 oz (69.967 kg)  03/25/11 157 lb (71.215 kg)  10/02/10 138 lb 1.9 oz (62.651 kg)  09/30/10 139 lb (63.05 kg)  05/21/10 155 lb 8 oz (70.534 kg)  04/02/10 158 lb (71.668 kg)  03/09/10 161 lb (73.029 kg)  01/07/10 170 lb 2.1 oz (77.171 kg)  12/03/09 167 lb (75.751 kg)  10/23/09 177 lb 6.1 oz (80.46 kg)  09/15/09 180 lb (81.647 kg)  08/13/09 178 lb 6.1 oz (80.913 kg)  Usual Wt: 165 - 170 lb - 1 year ago % Usual Wt: 76%  Body mass index is 21.30 kg/(m^2). Weight is WNL.  Food/Nutrition Related Hx: variable intake PTA  Labs:  CMP     Component Value Date/Time   NA 139 01/24/2012 0650   NA 140 11/02/2011 1611   K 3.8 01/24/2012 0650   CL 104 01/24/2012 0650   CO2 26  01/24/2012 0650   GLUCOSE 69* 01/24/2012 0650   GLUCOSE 104* 11/02/2011 1611   BUN 12 01/24/2012 0650   BUN 20 11/02/2011 1611   CREATININE 1.24* 01/24/2012 0650   CALCIUM 8.1* 01/24/2012 0650   PROT 7.2 01/23/2012 0118   PROT 6.6 11/02/2011 1611   ALBUMIN 3.9 01/23/2012 0118   AST 24 01/23/2012 0118   ALT 10 01/23/2012 0118   ALKPHOS 103 01/23/2012 0118   BILITOT 0.3 01/23/2012 0118   GFRNONAA 38* 01/24/2012 0650   GFRAA 45* 01/24/2012 0650   No phosphorus or magnesium available.   Intake/Output Summary (Last 24 hours) at 01/24/12 1101 Last data filed at 01/24/12 0900  Gross per 24 hour  Intake 1592.5 ml  Output      0 ml  Net 1592.5 ml   Diet Order: General  Supplements/Tube Feeding: none  IVF:    sodium chloride Last Rate: 75 mL/hr at 01/24/12 0031    Estimated Nutritional Needs:   Kcal: 1300 - 1500 kcal Protein: 50 - 65 grams Fluid: at least 1.5 liters daily  Pt admitted with abdominal/epigastric pain. Per patient this started several months PTA and has been associated with poor oral intake and wt loss of unknown amount. Pt  states that she used to weigh 200 lb a few years ago. More recently per records, pt was weighing 165 - 170 lb a year ago. Current weight is down to 128 lb. This is a weight change of 24% x 1 year. This is significant wt loss. Based on patient's dietary recall, pt likely meeting <75% of estimated energy intake for at least 1 month. Pt meets criteria for severe malnutrition in the context of chronic illness as evidenced by intake of <75% of estimated energy requirement for at least 1 month and 24% wt loss loss x 1 year; also likely muscle and fat wasting.  Pt is currently on a Regular diet, consumed 100% of clear liquid breakfast this morning. Pt just advanced to Regular diet. She states that her stomach is feeling a lot better and is ready to eat solid foods.  Noted pt on Remeron. She says that she has taken Ensure before and that she enjoys it, however it is expensive and she cannot afford to take it regularly. RD provided pt with coupons.  NUTRITION DIAGNOSIS: -Inadequate oral intake (NI-2.1).  Status: Ongoing  RELATED TO: variable appetite and recent GI distress  AS EVIDENCE BY: pt report and ongoing wt loss  MONITORING/EVALUATION(Goals): Goal: Pt to meet >/= 90% of their estimated nutrition needs Monitor: weight trends, lab trends, I/O's, PO intake, supplement tolerance  EDUCATION NEEDS: -Education needs addressed  Jarold Motto MS, RD, LDN Pager: [phone] After-hours pager: [phone]

## 2012-01-24 NOTE — Progress Notes (Signed)
Patient ID: [name], female   DOB: January 02, 1924, 78 y.o.   MRN: [id number]  [facility] PROGRESS NOTE  [name] DGU:[id number] DOB: July 19, 1923 DOA: 01/23/2012 PCP: Loreen Freud, DO  Brief narrative: Pt is 78 yo female who presents with main concern of intermittent episodes of epigastric pain, that initially started several months prior to admission and has been associated with poor oral intake and weight loss of unknown amount. Pt reports that pain is sharp, intermittent in nature, 7/10 in severity when present, non radiating, aggravated by food and with no specific alleviating factors, associated with progressive weight loss. She denies any chest pain, shortness of breath, no urinary concerns. Admitted 09/08 for further evaluation of likely mesenteric ischemia.   Assessment and Plan:  Active Problems:  Abdominal pain  - likely secondary to an ischemic colitis as is suggested by symptoms, rather than the infectious etiology  - pt reports feeling better this AM and would like to try eating - appreciate VVS input and will continue to follow up on recommendations - plan for arteriogram in AM with possible angioplasty  Acute on chronic renal failure  - with component of pre renal etiology  - will continue IVF for now but with gently hydration  - d/c Rampiril for now and if needed increase the dose of other BP medications - when renal function improves hopefully by tomorrow may consider adding Rampiril if indicated - BMP in AM   Leukocytosis  - secondary to principal problem  - now resolved - CBC in AM   Consultants:  Dr. Tawanna Cooler Early with vascular surgery  Procedures/Studies:  Ct Abdomen Pelvis Wo Contrast 01/23/2012   IMPRESSION:   1.  Marked colonic wall thickening involving the splenic flexure, descending colon and proximal sigmoid colon.  The process is most prominent at the splenic flexure where there is surrounding inflammatory change.  Given the location of  the epicenter of the process at the SMA/IMA water shed, and the background of extensive underlying atherosclerotic vascular disease, ischemic colitis is favored.  Additional differential considerations include infectious, and inflammatory colitis.   2.  Extensive atherosclerotic vascular disease as detailed above. Evaluation is limited in the absence of intravenous contrast.   3. Additional ancillary findings as detailed above without significant interval change.    US Abdomen Complete 01/23/2012  IMPRESSION:  Surgical absence of the gallbladder.  Small cysts in the liver.  Prominent pancreatic duct similar to previous CT study.  No acute changes identified.     Dg Chest Port 1 View 01/23/2012    IMPRESSION:  Emphysematous changes and scattered fibrosis in the lungs.  No evidence of active pulmonary disease.   Stable appearance since previous study.     Antibiotics:  None  Code Status: Full Family Communication: Pt at bedside Disposition Plan: Home when medically stable, PT done and no recommendations for further care  HPI/Subjective: No events overnight.   Objective: Filed Vitals:   01/23/12 1802 01/23/12 2056 01/24/12 0546 01/24/12 1000  BP: 117/57 139/72 104/54 162/73  Pulse: 71 63 60 72  Temp: 98.2 F (36.8 C) 98.3 F (36.8 C) 98.6 F (37 C) 98 F (36.7 C)  TempSrc: Oral Oral Oral Oral  Resp: 18 18 18 18   Height:      Weight:      SpO2: 100% 94% 96% 92%    Intake/Output Summary (Last 24 hours) at 01/24/12 1501 Last data filed at 01/24/12 0900  Gross per 24 hour  Intake 1376.25 ml  Output      0 ml  Net 1376.25 ml    Exam:   General:  Pt is alert, follows commands appropriately, not in acute distress  Cardiovascular: Regular rate and rhythm, S1/S2, no murmurs, no rubs, no gallops  Respiratory: Clear to auscultation bilaterally, no wheezing, no crackles, no rhonchi  Abdomen: Soft, non tender, non distended, bowel sounds present, no guarding  Extremities:  No edema, pulses DP and PT palpable bilaterally  Neuro: Grossly nonfocal  Data Reviewed: Basic Metabolic Panel:  Lab 01/24/12 8413 01/23/12 0118  NA 139 140  K 3.8 4.5  CL 104 100  CO2 26 29  GLUCOSE 69* 112*  BUN 12 17  CREATININE 1.24* 1.83*  CALCIUM 8.1* 9.4  MG -- --  PHOS -- --   Liver Function Tests:  Lab 01/23/12 0118  AST 24  ALT 10  ALKPHOS 103  BILITOT 0.3  PROT 7.2  ALBUMIN 3.9    Lab 01/23/12 0118  LIPASE 11  AMYLASE --   No results found for this basename: AMMONIA:5 in the last 168 hours CBC:  Lab 01/24/12 0650 01/23/12 0118  WBC 7.8 15.2*  NEUTROABS -- 13.2*  HGB 11.2* 14.0  HCT 33.5* 41.3  MCV 95.2 95.4  PLT 274 326   CBG:  Lab 01/24/12 1116 01/24/12 0852 01/24/12 0800 01/23/12 1539  GLUCAP 70 121* 68* 100*    Recent Results (from the past 240 hour(s))  URINE CULTURE     Status: Normal (Preliminary result)   Collection Time   01/23/12  3:17 AM      Component Value Range Status Comment   Specimen Description URINE, CLEAN CATCH   Final    Special Requests NONE   Final    Culture  Setup Time 01/23/2012 13:58   Final    Colony Count PENDING   Incomplete    Culture Culture reincubated for better growth   Final    Report Status PENDING   Incomplete      Scheduled Meds:   . sodium chloride   Intravenous STAT  . aspirin  325 mg Oral Daily  . atorvastatin  40 mg Oral q1800  . carvedilol  3.125 mg Oral BID WC  . digoxin  125 mcg Oral Daily  . docusate sodium  100 mg Oral BID  . feeding supplement  1 Container Oral TID BM  . mirtazapine  30 mg Oral QHS  . multivitamin with minerals  1 tablet Oral Daily  . nicotine  21 mg Transdermal Daily  . ramipril  5 mg Oral Daily  . sodium chloride  3 mL Intravenous Q12H   Continuous Infusions:   . sodium chloride 75 mL/hr at 01/24/12 0031     Debbora Presto, MD  [facility] Pager [phone]  If 7PM-7AM, please contact night-coverage [email or web address] Password  [email or web address] 01/24/2012, 3:01 PM   LOS: 1 day

## 2012-01-24 NOTE — Progress Notes (Signed)
Utilization review completed.  

## 2012-01-25 ENCOUNTER — Encounter (HOSPITAL_COMMUNITY): Payer: Self-pay | Admitting: Physician Assistant

## 2012-01-25 ENCOUNTER — Encounter (HOSPITAL_COMMUNITY)
Admission: EM | Disposition: A | Discharge status: Home / Self Care | Payer: Self-pay | Source: Home / Self Care | Attending: Internal Medicine

## 2012-01-25 DIAGNOSIS — R933 Abnormal findings on diagnostic imaging of other parts of digestive tract: Secondary | ICD-10-CM

## 2012-01-25 DIAGNOSIS — R1084 Generalized abdominal pain: Secondary | ICD-10-CM

## 2012-01-25 HISTORY — PX: ABDOMINAL AORTAGRAM: SHX5454

## 2012-01-25 LAB — BASIC METABOLIC PANEL WITH GFR
BUN: 13 mg/dL (ref 6–23)
CO2: 28 meq/L (ref 19–32)
Calcium: 8.5 mg/dL (ref 8.4–10.5)
Chloride: 110 meq/L (ref 96–112)
Creatinine, Ser: 1.23 mg/dL — ABNORMAL HIGH (ref 0.50–1.10)
GFR calc Af Amer: 45 mL/min — ABNORMAL LOW
GFR calc non Af Amer: 39 mL/min — ABNORMAL LOW
Glucose, Bld: 86 mg/dL (ref 70–99)
Potassium: 4 meq/L (ref 3.5–5.1)
Sodium: 145 meq/L (ref 135–145)

## 2012-01-25 LAB — CBC
HCT: 31.6 % — ABNORMAL LOW (ref 36.0–46.0)
Hemoglobin: 10.6 g/dL — ABNORMAL LOW (ref 12.0–15.0)
MCH: 32.1 pg (ref 26.0–34.0)
MCHC: 33.5 g/dL (ref 30.0–36.0)
MCV: 95.8 fL (ref 78.0–100.0)
Platelets: 245 K/uL (ref 150–400)
RBC: 3.3 MIL/uL — ABNORMAL LOW (ref 3.87–5.11)
RDW: 15.2 % (ref 11.5–15.5)
WBC: 8 K/uL (ref 4.0–10.5)

## 2012-01-25 LAB — URINE CULTURE: Colony Count: 55000

## 2012-01-25 LAB — GLUCOSE, CAPILLARY: Glucose-Capillary: 80 mg/dL (ref 70–99)

## 2012-01-25 SURGERY — ABDOMINAL AORTAGRAM
Anesthesia: LOCAL

## 2012-01-25 MED ORDER — PANTOPRAZOLE SODIUM 40 MG PO TBEC
40.0000 mg | DELAYED_RELEASE_TABLET | Freq: Every day | ORAL | Status: DC
  Administered 2012-01-27: 40 mg via ORAL
  Filled 2012-01-25 (×2): qty 1

## 2012-01-25 MED ORDER — METOPROLOL TARTRATE 1 MG/ML IV SOLN
2.0000 mg | INTRAVENOUS | Status: DC | PRN
  Administered 2012-01-25: 5 mg via INTRAVENOUS
  Filled 2012-01-25: qty 5

## 2012-01-25 MED ORDER — ALUM & MAG HYDROXIDE-SIMETH 200-200-20 MG/5ML PO SUSP
15.0000 mL | ORAL | Status: DC | PRN
  Filled 2012-01-25: qty 30

## 2012-01-25 MED ORDER — METOPROLOL TARTRATE 1 MG/ML IV SOLN
INTRAVENOUS | Status: AC
  Filled 2012-01-25: qty 5

## 2012-01-25 MED ORDER — CLONIDINE HCL 0.2 MG PO TABS
0.2000 mg | ORAL_TABLET | ORAL | Status: DC | PRN
  Filled 2012-01-25: qty 1

## 2012-01-25 MED ORDER — FENTANYL CITRATE 0.05 MG/ML IJ SOLN
INTRAMUSCULAR | Status: AC
  Filled 2012-01-25: qty 2

## 2012-01-25 MED ORDER — ACETAMINOPHEN 325 MG PO TABS: 325.0000 mg | ORAL_TABLET | ORAL | Status: DC | PRN

## 2012-01-25 MED ORDER — LORAZEPAM 2 MG/ML IJ SOLN
1.0000 mg | Freq: Once | INTRAMUSCULAR | Status: AC
  Administered 2012-01-25: 1 mg via INTRAVENOUS
  Filled 2012-01-25 (×2): qty 1

## 2012-01-25 MED ORDER — SODIUM CHLORIDE 0.9 % IV SOLN: INTRAVENOUS | Status: DC

## 2012-01-25 MED ORDER — ONDANSETRON HCL 4 MG/2ML IJ SOLN: 4.0000 mg | Freq: Four times a day (QID) | INTRAMUSCULAR | Status: DC | PRN

## 2012-01-25 MED ORDER — PHENOL 1.4 % MT LIQD
1.0000 | OROMUCOSAL | Status: DC | PRN
  Filled 2012-01-25: qty 177

## 2012-01-25 MED ORDER — HEPARIN (PORCINE) IN NACL 2-0.9 UNIT/ML-% IJ SOLN
INTRAMUSCULAR | Status: AC
  Filled 2012-01-25: qty 1000

## 2012-01-25 MED ORDER — CLONIDINE HCL 0.2 MG PO TABS
0.2000 mg | ORAL_TABLET | Freq: Two times a day (BID) | ORAL | Status: DC
  Administered 2012-01-25 – 2012-01-27 (×5): 0.2 mg via ORAL
  Filled 2012-01-25 (×6): qty 1

## 2012-01-25 MED ORDER — CARVEDILOL 6.25 MG PO TABS
6.2500 mg | ORAL_TABLET | Freq: Two times a day (BID) | ORAL | Status: DC
  Administered 2012-01-25 – 2012-01-27 (×4): 6.25 mg via ORAL
  Filled 2012-01-25 (×6): qty 1

## 2012-01-25 MED ORDER — GUAIFENESIN-DM 100-10 MG/5ML PO SYRP
15.0000 mL | ORAL_SOLUTION | ORAL | Status: DC | PRN
  Filled 2012-01-25: qty 15

## 2012-01-25 MED ORDER — LIDOCAINE HCL (PF) 1 % IJ SOLN
INTRAMUSCULAR | Status: AC
  Filled 2012-01-25: qty 30

## 2012-01-25 MED ORDER — ACETAMINOPHEN 650 MG RE SUPP: 325.0000 mg | RECTAL | Status: DC | PRN

## 2012-01-25 MED ORDER — MIDAZOLAM HCL 2 MG/2ML IJ SOLN
INTRAMUSCULAR | Status: AC
  Filled 2012-01-25: qty 2

## 2012-01-25 NOTE — Progress Notes (Signed)
PT Cancellation Note  Treatment cancelled today due to patient receiving procedure or test, and then on bedrest for 4 hours post angiography;  Will follow-up tomorrow,  Thanks,  Van Clines [facility] 01/25/2012, 4:30 PM

## 2012-01-25 NOTE — Progress Notes (Signed)
Patient ID: [name], female   DOB: Oct 13, 1923, 78 y.o.   MRN: [id number] I have reviewed the patient's arteriogram and discussed them with Dr. Myra Gianotti and with the patient's sister by telephone. She has patency of all 3 mesenteric vessels. She does have extensive calcification but does not appear to have any flow limitation to her celiac or inferior mesenteric artery. She does have some moderate stenosis with a gradient in her superior mesenteric artery. With flow in all 3 mesenteric vessels, I do not feel that she has any significant mesenteric ischemia to explain her weight loss or abdominal pain or colitis. She could potentially have angioplasty to the superior mesenteric artery I feel that this has very little chance of improving her symptoms and significant chance for harm. She does have extreme calcification of her aorta with a decentered plaque at the area of the intermesenteric artery. Intervention at this area would put her at high risk for either embolus or rupture. I would recommend continued evaluation for weight loss and abdominal pain and conservative treatment regarding her aortic calcification and relatively isolated spear mesenteric artery stenosis

## 2012-01-25 NOTE — Progress Notes (Addendum)
Patient ID: [name], female   DOB: 27-Aug-1923, 78 y.o.   MRN: [id number]  [facility] PROGRESS NOTE  KRISANN MCKENNA GEX:[id number] DOB: 16-Nov-1923 DOA: 01/23/2012 PCP: Loreen Freud, DO  Brief narrative:  Pt is 78 yo female who presents with main concern of intermittent episodes of epigastric pain, that initially started several months prior to admission and has been associated with poor oral intake and weight loss of unknown amount. Pt reports that pain is sharp, intermittent in nature, 7/10 in severity when present, non radiating, aggravated by food and with no specific alleviating factors, associated with progressive weight loss. She denies any chest pain, shortness of breath, no urinary concerns. Admitted 09/08 for further evaluation of likely mesenteric ischemia.   Assessment and Plan:  Active Problems:  Abdominal pain  - associated with weight loss but unclear etiology at this time - arteriogram done today and all 3 mesenteric vessels patent - pt reports feeling better this AM and would like to try eating  - appreciate VVS input and will continue to follow up on recommendations  - plan for GI consult today for further recommendations on weight loss  Acute on chronic renal failure  - with component of pre renal etiology  - creatinine is now at pt's baseline - hold Rampiril for now  - BMP in AM   Leukocytosis  - secondary to principal problem  - now resolved  - CBC in AM    HTN, accelerated - will add additional antihypertensive medicine to pt regimen, Clonidine - will continue Coreg but will increase the dose to 6.25 mg PO BID - monitor vitals per floor protocol   Anemia - drop in Hg since admission - pt denies blood in stool - could be dilutional as well, will d/c IVF - anemia panel and CBC in AM  Consultants:  Dr. Tawanna Cooler Early with vascular surgery  Procedures/Studies:  Ct Abdomen Pelvis Wo Contrast  01/23/2012  IMPRESSION:  1. Marked colonic wall  thickening involving the splenic flexure, descending colon and proximal sigmoid colon. The process is most prominent at the splenic flexure where there is surrounding inflammatory change. Given the location of the epicenter of the process at the SMA/IMA water shed, and the background of extensive underlying atherosclerotic vascular disease, ischemic colitis is favored. Additional differential considerations include infectious, and inflammatory colitis.  2. Extensive atherosclerotic vascular disease as detailed above. Evaluation is limited in the absence of intravenous contrast.  3. Additional ancillary findings as detailed above without significant interval change.   US Abdomen Complete  01/23/2012  IMPRESSION:  Surgical absence of the gallbladder. Small cysts in the liver.  Prominent pancreatic duct similar to previous CT study. No acute changes identified.   Dg Chest Port 1 View  01/23/2012  IMPRESSION:  Emphysematous changes and scattered fibrosis in the lungs. No evidence of active pulmonary disease.  Stable appearance since previous study.   Antibiotics:  None  Code Status: Full  Family Communication: Pt at bedside  Disposition Plan: Home when medically stable, PT done and no recommendations for further care   HPI/Subjective: No events overnight.   Objective: Filed Vitals:   01/25/12 0747 01/25/12 0945 01/25/12 1114 01/25/12 1244  BP:  190/79 199/70 190/60  Pulse: 70 61 60   Temp:  98 F (36.7 C)    TempSrc:  Oral    Resp:  18    Height:      Weight:      SpO2:  98%  Intake/Output Summary (Last 24 hours) at 01/25/12 1420 Last data filed at 01/25/12 1100  Gross per 24 hour  Intake 2417.92 ml  Output      0 ml  Net 2417.92 ml    Exam:   General:  Pt is alert, follows commands appropriately, not in acute distress  Cardiovascular: Regular rate and rhythm, S1/S2, no murmurs, no rubs, no gallops  Respiratory: Clear to auscultation bilaterally, no wheezing, no  crackles, no rhonchi  Abdomen: Soft, non tender, non distended, bowel sounds present, no guarding  Extremities: No edema, pulses DP and PT palpable bilaterally  Neuro: Grossly nonfocal  Data Reviewed: Basic Metabolic Panel:  Lab 01/25/12 1191 01/24/12 0650 01/23/12 0118  NA 145 139 140  K 4.0 3.8 4.5  CL 110 104 100  CO2 28 26 29   GLUCOSE 86 69* 112*  BUN 13 12 17   CREATININE 1.23* 1.24* 1.83*  CALCIUM 8.5 8.1* 9.4  MG -- -- --  PHOS -- -- --   Liver Function Tests:  Lab 01/23/12 0118  AST 24  ALT 10  ALKPHOS 103  BILITOT 0.3  PROT 7.2  ALBUMIN 3.9    Lab 01/23/12 0118  LIPASE 11  AMYLASE --   No results found for this basename: AMMONIA:5 in the last 168 hours CBC:  Lab 01/25/12 0602 01/24/12 0650 01/23/12 0118  WBC 8.0 7.8 15.2*  NEUTROABS -- -- 13.2*  HGB 10.6* 11.2* 14.0  HCT 31.6* 33.5* 41.3  MCV 95.8 95.2 95.4  PLT 245 274 326   CBG:  Lab 01/25/12 0941 01/25/12 0837 01/24/12 1116 01/24/12 0852 01/24/12 0800  GLUCAP 93 80 70 121* 68*    Recent Results (from the past 240 hour(s))  URINE CULTURE     Status: Normal   Collection Time   01/23/12  3:17 AM      Component Value Range Status Comment   Specimen Description URINE, CLEAN CATCH   Final    Special Requests NONE   Final    Culture  Setup Time 01/23/2012 13:58   Final    Colony Count 55,000 COLONIES/ML   Final    Culture     Final    Value: Multiple bacterial morphotypes present, none predominant. Suggest appropriate recollection if clinically indicated.   Report Status 01/25/2012 FINAL   Final      Scheduled Meds:   . aspirin  325 mg Oral Daily  . atorvastatin  40 mg Oral q1800  . carvedilol  3.125 mg Oral BID WC  . digoxin  125 mcg Oral Daily  . docusate sodium  100 mg Oral BID  . feeding supplement  1 Container Oral TID BM  . fentaNYL      . heparin      . lidocaine      . LORazepam  1 mg Intravenous Once  . midazolam      . mirtazapine  30 mg Oral QHS  . multivitamin with  minerals  1 tablet Oral Daily  . nicotine  21 mg Transdermal Daily  . sodium chloride  3 mL Intravenous Q12H  . DISCONTD: ramipril  5 mg Oral Daily   Continuous Infusions:   . DISCONTD: sodium chloride 50 mL/hr at 01/25/12 1107  . DISCONTD: sodium chloride       Debbora Presto, MD  [facility] Pager [phone]  If 7PM-7AM, please contact night-coverage [email or web address] Password [email or web address] 01/25/2012, 2:20 PM   LOS: 2 days

## 2012-01-25 NOTE — Op Note (Signed)
[facility]  Patient name: [name] MRN: [id number] DOB: [birth year] Sex: female  01/23/2012 - 01/25/2012 Pre-operative Diagnosis: Rule out Stenosis / ischemia Post-operative diagnosis:  Same Surgeon:  Jorge Ny Procedure Performed:  1.  ultrasound-guided access, right common femoral artery  2.  abdominal aortogram  3.  first order catheterization (celiac artery)  4.  celiac artery angiogram  5.  first order catheterization (superior mesenteric artery)  6.  superior mesenteric artery angiogram    Indications:  The patient presented to the hospital for weight loss and abdominal pain with evidence of ischemic colitis. She comes in today for further evaluation for mesenteric ischemia. A CT scan suggested mesenteric stenosis and a calcified aorta  Procedure:  The patient was identified in the holding area and taken to room 8.  The patient was then placed supine on the table and prepped and draped in the usual sterile fashion.  A time out was called.  Ultrasound was used to evaluate the right common femoral artery.  It was patent .  A digital ultrasound image was acquired.  A micropuncture needle was used to access the right common femoral artery under ultrasound guidance.  An 018 wire was advanced without resistance and a micropuncture sheath was placed.  The 018 wire was removed and a benson wire was placed.  The micropuncture sheath was exchanged for a 5 french sheath.  An omniflush catheter was advanced over the wire to the level of T-12. Next, using a SOS catheter the celiac axis, and the superior mesenteric artery were individually selected. Celiac angiography and superior mesenteric artery angiography were performed.    Findings:   Aortogram:  The supra-celiac aorta appears ectatic. The celiac and superior mesenteric artery are patent. There appears to be ostial stenosis. A large marginal arteries is visualized which does not have angiographic  evidence of stenosis at the inferior mesenteric artery appeared the left renal artery appears to be widely patent. The origin of the right renal artery is not well evaluated due to a large calcific plaque extending into the aorta at the level of the renal arteries.  Celiac axis:  The distal branches of the celiac axis appear to be widely patent without stenosis. The origin of the celiac artery has a approximately a 50-60% stenosis. However, there is no pressure gradient across this area, suggesting that this is not a hemodynamically significant stenosis.  Superior mesenteric artery:  There is ostial stenosis of the superior mesenteric artery. The distal portion of the superior mesenteric artery appears to be widely patent. There was approximately a 50 mm pressure gradient across the ostial lesion , which suggest a hemodynamically significant lesion.  Intervention:  None was performed today due to the potential risks to the patient was need to be adequately explained to her, as well as the total administration of contrast both from her CT scan and angiography today. She received 105 mL of contrast today.  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  #2  ostial stenosis of the superior mesenteric artery with a 50 mm pressure gradient, suggesting that this is a hemodynamically significant stenosis.  #3  large marginal artery present with no evidence of inferior mesenteric artery stenosis  #4  possible right renal artery stenosis which is not well evaluated due to a large calcified plaque within the aorta at this level  #5  widely patent right common iliac stent. 60% stenosis at the origin of the left common iliac artery   V. Durene Cal, M.D. [facility] [place] Office: [phone] Pager:  [phone]

## 2012-01-25 NOTE — Consult Note (Signed)
[facility] Consult: 2:47 PM 01/25/2012   Referring Provider: Dr Lenise Arena Primary Care Physician:  Loreen Freud, DO Primary Gastroenterologist: None, unassigned  Reason for Consultation:  Ischemic colitis, post-prandial abdominal pain  HPI: [name] is a 78 y.o. female.  Has CAD and ASPVD, DM, s/p pacemaker.  S/p RCA angioplasty and stenting.  Pt had segmental colitis in descending colon in 10/ 2011, attributed to infectious etiology.  Admitted 01/23/12 with acute upper abdominal pain, and CT showed left sided colitis, most likely ischemic etiology.  Dr Myra Gianotti did abd aortogram today.   Dr Early states: "She has patency of all 3 mesenteric vessels. She does have extensive calcification but does not appear to have any flow limitation to her celiac or inferior mesenteric artery. She does have some moderate stenosis with a gradient in her superior mesenteric artery. With flow in all 3 mesenteric vessels, I do not feel that she has any significant mesenteric ischemia to explain her weight loss or abdominal pain or colitis. She could potentially have angioplasty to the superior mesenteric artery I feel that this has very little chance of improving her symptoms and significant chance for harm. She does have extreme calcification of her aorta with a decentered plaque at the area of the intermesenteric artery. Intervention at this area would put her at high risk for either embolus or rupture. I would recommend continued evaluation for weight loss and abdominal pain and conservative treatment regarding her aortic calcification and relatively isolated spear mesenteric artery stenosis".   Pt says the pain was acute in onset.  She currently denies any lengthy history of post-prandial pain, in contradiction to H & P stating several months of abdominal pain.  But she had 3 abdominal CT scans in 2011, all done to evaluate abdominal pain.   She denies nausea,  bloody stools, diarrhea, constipation, dysphagia, heartburn.  Stool FOB test not performed.  Weight in 2008 was 173 #,  Jan 2013 at [facility] was 154 #, currently it it 128 #.   2011 office visit note to Dr Dayton Martes mentions appetite decrease along with anxiety and depression, started pt on Remeron  Dr Lenise Arena is seeking GI input as to mgt. At present pt denies abdominal pain and nausea.   No previous EGD or colonoscopies in records [facility] reviewed too).  Pt denies having had these in past.  However in looking up the "allergy" to Naproxen, gastritis is listed as the adverse event.  ROS No headache, no dizziness, no joint pain, no foot swelling, no chest pain, no SOB Uses walker.  Lives with her son. No rash or itching + bladder incontinence.  Denies fecal incontinence.  No fevers or chills.   No insomnia.  No nose bleeds.  No prior blood transfusion. Home oxygen PRN.   Past Medical History  Diagnosis Date  . Coronary artery disease   . MI (myocardial infarction)   . PVD (peripheral vascular disease)   . History of atrial flutter   . Tachy-brady syndrome   . Paroxysmal atrial fibrillation   . COPD (chronic obstructive pulmonary disease)   . Hyperlipidemia   . Diabetes mellitus   . Diastolic congestive heart failure   . Arthritis   . Chronic kidney disease     Past Surgical History  Procedure Date  . Cardiac catheterization   . Insert / replace / remove pacemaker     [employer]  . Cholecystectomy   . Total knee arthroplasty     bilateral  . Bilateral  hip arthroscopy   . 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 Myocardial perfusion study     EF 59% 4/04  . Adenosine myoview study     abn EF 53% 12/04/04  . Carotid doppler 2/99  . Echo with lvh 1/98    old records  . Stress cardiolite 3/04    EF 53%  . Gamma knife radiosurg 08/25/06    [facility]  . Pvd     with stent in the right iliac     Prior to Admission medications   Medication Sig  Start Date End Date Taking? Authorizing Provider  acetaminophen (TYLENOL) 650 MG CR tablet Take 1 tablet (650 mg total) by mouth every 8 (eight) hours as needed. 06/03/11  Yes Dianne Dun, MD  aspirin 325 MG tablet Take 325 mg by mouth daily.   Yes Historical Provider, MD  atorvastatin (LIPITOR) 40 MG tablet Take 1 tablet (40 mg total) by mouth daily. 05/20/11  Yes Dianne Dun, MD  carvedilol (COREG) 3.125 MG tablet Take 1 tablet (3.125 mg total) by mouth 2 (two) times daily with a meal. 05/20/11  Yes Dianne Dun, MD  digoxin (LANOXIN) 0.125 MG tablet Take 1 tablet (125 mcg total) by mouth daily. 01/03/12  Yes Dianne Dun, MD  docusate sodium (COLACE) 100 MG capsule Take 1 capsule (100 mg total) by mouth 2 (two) times daily. 10/13/11  Yes Dianne Dun, MD  furosemide (LASIX) 20 MG tablet Take 10 mg by mouth daily. Takes 1/2 tablet daily. 08/16/11 08/15/12 Yes Dianne Dun, MD  meloxicam (MOBIC) 7.5 MG tablet Take 1 tablet (7.5 mg total) by mouth daily. 01/03/12  Yes Dianne Dun, MD  metFORMIN (GLUMETZA) 500 MG (MOD) 24 hr tablet Take 1 tablet (500 mg total) by mouth 2 (two) times daily with a meal. 07/19/11  Yes Dianne Dun, MD  ramipril (ALTACE) 5 MG capsule Take 1 capsule (5 mg total) by mouth daily. 04/22/11  Yes Dianne Dun, MD  Incontinence Supply Disposable (CONTROL PADS EXTRA ABSORBENCY) MISC Use as directed. 11/26/11   Dianne Dun, MD  mirtazapine (REMERON) 30 MG tablet Take 1 tablet (30 mg total) by mouth at bedtime. 11/09/11   Dianne Dun, MD  nitroGLYCERIN (NITROSTAT) 0.4 MG SL tablet Place 0.4 mg under the tongue every 5 (five) minutes as needed.      Historical Provider, MD    Scheduled Meds:    . aspirin  325 mg Oral Daily  . atorvastatin  40 mg Oral q1800  . carvedilol  6.25 mg Oral BID WC  . cloNIDine  0.2 mg Oral BID  . digoxin  125 mcg Oral Daily  . docusate sodium  100 mg Oral BID  . feeding supplement  1 Container Oral TID BM  . fentaNYL      . heparin      . lidocaine      .  LORazepam  1 mg Intravenous Once  . midazolam      . mirtazapine  30 mg Oral QHS  . multivitamin with minerals  1 tablet Oral Daily  . nicotine  21 mg Transdermal Daily  . sodium chloride  3 mL Intravenous Q12H    PRN Meds: acetaminophen, acetaminophen, alum & mag hydroxide-simeth, guaiFENesin-dextromethorphan, HYDROcodone-acetaminophen, metoprolol, morphine injection, nitroGLYCERIN, ondansetron (ZOFRAN) IV, ondansetron, phenol,    Allergies as of 01/23/2012 - Review Complete 01/23/2012  Allergen Reaction Noted  . Naproxen  Family History  Problem Relation Age of Onset  . Cancer Neg Hx   . Depression      ?  . Diabetes      DM and HBP- family hx    History   Social History  . Marital Status: Widowed    Spouse Name: N/A    Number of Children: N/A  . Years of Education: N/A   Occupational History  . Not on file.   Social History Main Topics  . Smoking status: Former Smoker -- 0.5 packs/day for 0 years    Types: Cigarettes    Quit date: 02/22/2011  . Smokeless tobacco: Never Used  . Alcohol Use: No  . Drug Use: No  . Sexually Active:    Other Topics Concern  . Not on file   Social History Narrative   Widowed, husband died at age 97-DM, HTN. 3 children. Nursing assistant in past, active in church. Son lives with her and does most of the housework and cooking. 1/10Pt signed party release form and gives Zane Herald, granddaughter [phone]), access to medical records. Can leave msg on machine [phone]) or cell.     PHYSICAL EXAM: Vital signs in last 24 hours: Temp:  [97.7 F (36.5 C)-98.8 F (37.1 C)] 98 F (36.7 C) (09/10 0945) Pulse Rate:  [60-84] 60  (09/10 1114) Resp:  [18] 18  (09/10 0945) BP: (135-199)/(60-79) 190/60 mmHg (09/10 1244) SpO2:  [92 %-98 %] 98 % (09/10 0945) Weight:  [128 lb (58.06 kg)] 128 lb (58.06 kg) (09/09 2022)  General: thin, elderly AAF.  Seems depressed Head:  No facial swelling or asymmetry  Eyes:  No icterus or  pallor Ears:  Not HOH  Nose:  No discharge Mouth:  Moist, pink, clear MM.  No teeth Neck:  No mass or JVD Lungs:  Clear B.  Loose cough.  No dyspnea Heart: RRR.  No MRG.  s1 and 2 present Abdomen:  Soft, thin, ND.  No bruits, hernias,tenderness or masses.  No HSM.[name]   Rectal: not performed   Musc/Skeltl: post op knee appearance B Extremities:  No pedal edema GU:  Incontinent of urine, gown and bedding are wet, though pt is wearing depends  Neurologic:  Oriented to year, self, [place]. Moves all 4s.  No tremor Skin:  No rash.  Skin is dry and shedding skin seen in bed linens Tattoos:  none Nodes:  No cervical adenopathy   Psych:  Flat affect, pt not looking at interviewer when she speaks. Not agitated  LAB RESULTS:  Basename 01/25/12 0602 01/24/12 0650 01/23/12 0118  WBC 8.0 7.8 15.2*  HGB 10.6* 11.2* 14.0  HCT 31.6* 33.5* 41.3  PLT 245 274 326  MCV           95   BMET Lab Results  Component Value Date   NA 145 01/25/2012   NA 139 01/24/2012   NA 140 01/23/2012   K 4.0 01/25/2012   K 3.8 01/24/2012   K 4.5 01/23/2012   CL 110 01/25/2012   CL 104 01/24/2012   CL 100 01/23/2012   CO2 28 01/25/2012   CO2 26 01/24/2012   CO2 29 01/23/2012   GLUCOSE 86 01/25/2012   GLUCOSE 69* 01/24/2012   GLUCOSE 112* 01/23/2012   BUN 13 01/25/2012   BUN 12 01/24/2012   BUN 17 01/23/2012   CREATININE 1.23* 01/25/2012   CREATININE 1.24* 01/24/2012   CREATININE 1.83* 01/23/2012   CALCIUM 8.5 01/25/2012   CALCIUM 8.1* 01/24/2012  CALCIUM 9.4 01/23/2012   LFT  Basename 01/23/12 0118  PROT 7.2  ALBUMIN 3.9  AST 24  ALT 10  ALKPHOS 103  BILITOT 0.3  BILIDIR --  IBILI --   PT/INR Lab Results  Component Value Date   INR 0.98 09/20/2010   INR 1.6* 01/23/2008   INR 2.2* 01/21/2008    RADIOLOGY STUDIES: 01/23/2012  Abdominal Ultrasound Clinical Data: Left-sided abdominal pain. Diarrhea.  COMPLETE ABDOMINAL ULTRASOUND  Comparison: CT abdomen and pelvis 02/27/2010  Findings:  Gallbladder: Gallbladder is  surgically absent.  Common bile duct: Normal caliber with measured diameter of 4.5 mm.  Liver: Normal homogeneous liver parenchymal echotexture. Small  cystic lesions measuring less than 1 cm diameter corresponding to  cysts seen on previous CT.  IVC: Appears normal.  Pancreas: Pancreatic duct is upper limits of normal diameter,  measuring 1.7 mm. Diffuse pancreatic atrophy. Stable since  previous CT.  Spleen: Spleen length measures 4.1 cm. Normal homogeneous  parenchymal echotexture.  Right Kidney: Right kidney measures 8.6 cm length. Diffuse  parenchymal atrophy is suggested. No hydronephrosis.  Left Kidney: Left kidney measures 8.6 cm length. Diffuse  parenchymal atrophy. No hydronephrosis.  Abdominal aorta: No aneurysm identified.  IMPRESSION:  Surgical absence of the gallbladder. Small cysts in the liver.  Prominent pancreatic duct similar to previous CT study. No acute  changes identified.  Original Report Authenticated By: Marlon Pel, M.D.  01/23/2012 CT abdomen and Pelvis without contrast IMPRESSION:  1. Marked colonic wall thickening involving the splenic flexure,  descending colon and proximal sigmoid colon. The process is most  prominent at the splenic flexure where there is surrounding  inflammatory change. Given the location of the epicenter of the  process at the SMA/IMA water shed, and the background of extensive  underlying atherosclerotic vascular disease, ischemic colitis is  favored. Additional differential considerations include  infectious, and inflammatory colitis.  2. Extensive atherosclerotic vascular disease as detailed above.  Evaluation is limited in the absence of intravenous contrast.  3. Additional ancillary findings as detailed above without  significant interval change.  Original Report Authenticated By: HEATH      IMPRESSION: *  Ischemic colitis in setting of non-flow limiting mesenteric vascular disease. Pt has extensive peripharal  vasc dz as well as history of cardiac stent in setting of acute MI.  Given prior hx of segmental colitis in 2011, this is almost certainly ischemic in etiology  PLAN: *  Per Dr Arlyce Dice.  No clear indication for endoscopy, though if  EGD demonstrated PUD, NSAID (Mobic) injury, GERD would give support to treating her with chronic PPI *  May need to initiate prn narcotic mgt for pain *  Given chronic Mobic, will add prophylactic Protonix/PPI to medical regimen, thought doubt it will lessen incidence of abdominal pain..   LOS: 2 days   Jennye Moccasin  01/25/2012, 2:47 PM Pager: [phone]

## 2012-01-25 NOTE — Progress Notes (Signed)
Utilization review completed.  

## 2012-01-25 NOTE — H&P (Signed)
[facility]   Vascular Consult Note  Patient name: [name] MRN: [id number] DOB: 12-May-1924 Sex: female  Referred by: Dr Izola Price  Reason for referral: Mesenteric ischemia  HISTORY OF PRESENT ILLNESS:  The patient is an 78 year old female with several year history of weight loss. This is been attributed to several events including possible depression. She does have a long history of diabetes. She was admitted to the hospital with abdominal pain and CT scan revealed thickening of her colon with probable ischemic colitis. She does specifically denies any postprandial pain. She denies abdominal pain prior to this most recent event. She reports that she is not specifically trying to lose weight. She does have extensive past medical history with peripheral vascular occlusive disease and coronary disease. She is status post right common iliac artery angioplasty and stenting in years past.   Past Medical History   Diagnosis  Date   .  Coronary artery disease    .  MI (myocardial infarction)    .  PVD (peripheral vascular disease)    .  History of atrial flutter    .  Tachy-brady syndrome    .  Paroxysmal atrial fibrillation    .  COPD (chronic obstructive pulmonary disease)    .  Hyperlipidemia    .  Diabetes mellitus    .  Diastolic congestive heart failure    .  Arthritis    .  Chronic kidney disease     Past Surgical History   Procedure  Date   .  Cardiac catheterization    .  Insert / replace / remove pacemaker      [employer]   .  Cholecystectomy    .  Total knee arthroplasty      bilateral   .  Bilateral hip arthroscopy    .  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 recurrent tumor-[facility] 1/08   .  Myocardial perfusion study      EF 59% 4/04   .  Adenosine myoview study      abn EF 53% 12/04/04   .  Carotid doppler  2/99   .  Echo with [facility]vh  1/98     old records   .  Stress cardiolite  3/04     EF 53%   .  Gamma knife radiosurg   08/25/06     [facility]   .  Pvd      with stent in the right iliac    History    Social History   .  Marital Status:  Widowed     Spouse Name:  N/A     Number of Children:  N/A   .  Years of Education:  N/A    Occupational History   .  Not on file.    Social History Main Topics   .  Smoking status:  Former Smoker -- 0.5 packs/day for 0 years     Types:  Cigarettes     Quit date:  02/22/2011   .  Smokeless tobacco:  Never Used   .  Alcohol Use:  No   .  Drug Use:  No   .  Sexually Active:     Other Topics  Concern   .  Not on file    Social History Narrative    Widowed, husband died at age 64-DM, HTN. 3 children. Nursing assistant in past, active in church. Son lives with her and does  most of the housework and cooking. 1/10Pt signed party release form and gives Zane Herald, granddaughter [phone]), access to medical records. Can leave msg on machine [phone]) or cell.    Family History   Problem  Relation  Age of Onset   .  Cancer  Neg Hx    .  Depression        ?    .  Diabetes        DM and HBP- family hx    Allergies   Allergen  Reactions   .  Naproxen      REACTION: gastritis    Prior to Admission medications   Medication  Sig  Start Date  End Date  Taking?  Authorizing Provider   acetaminophen (TYLENOL) 650 MG CR tablet  Take 1 tablet (650 mg total) by mouth every 8 (eight) hours as needed.  06/03/11   Yes  Dianne Dun, MD   aspirin 325 MG tablet  Take 325 mg by mouth daily.    Yes  Historical Provider, MD   atorvastatin (LIPITOR) 40 MG tablet  Take 1 tablet (40 mg total) by mouth daily.  05/20/11   Yes  Dianne Dun, MD   carvedilol (COREG) 3.125 MG tablet  Take 1 tablet (3.125 mg total) by mouth 2 (two) times daily with a meal.  05/20/11   Yes  Dianne Dun, MD   digoxin (LANOXIN) 0.125 MG tablet  Take 1 tablet (125 mcg total) by mouth daily.  01/03/12   Yes  Dianne Dun, MD   docusate sodium (COLACE) 100 MG capsule  Take 1 capsule (100 mg total) by mouth 2  (two) times daily.  10/13/11   Yes  Dianne Dun, MD   furosemide (LASIX) 20 MG tablet  Take 10 mg by mouth daily. Takes 1/2 tablet daily.  08/16/11  08/15/12  Yes  Dianne Dun, MD   meloxicam (MOBIC) 7.5 MG tablet  Take 1 tablet (7.5 mg total) by mouth daily.  01/03/12   Yes  Dianne Dun, MD   metFORMIN (GLUMETZA) 500 MG (MOD) 24 hr tablet  Take 1 tablet (500 mg total) by mouth 2 (two) times daily with a meal.  07/19/11   Yes  Dianne Dun, MD   ramipril (ALTACE) 5 MG capsule  Take 1 capsule (5 mg total) by mouth daily.  04/22/11   Yes  Dianne Dun, MD   Incontinence Supply Disposable (CONTROL PADS EXTRA ABSORBENCY) MISC  Use as directed.  11/26/11    Dianne Dun, MD   mirtazapine (REMERON) 30 MG tablet  Take 1 tablet (30 mg total) by mouth at bedtime.  11/09/11    Dianne Dun, MD   nitroGLYCERIN (NITROSTAT) 0.4 MG SL tablet  Place 0.4 mg under the tongue every 5 (five) minutes as needed.     Historical Provider, MD   REVIEW OF SYSTEMS:  Reviewed in history and physical with nothing to add other than her past medical history  PHYSICAL EXAMINATION:  Filed Vitals:    01/24/12 1000   BP:  162/73   Pulse:  72   Temp:  98 F (36.7 C)   Resp:  18    General: The patient appears their stated age.  Pulmonary: There is a good air exchange bilaterally  Abdomen: Soft and non-tender with normal pitch bowel sounds. No tenderness and no distention.  Musculoskeletal: There are no major deformities. There is no significant extremity pain.  Neurologic: No focal weakness or paresthesias are detected,  Skin: There are no ulcer or rashes noted.  Psychiatric: The patient has normal affect.  Pulse status: 2+ femoral pulses bilaterally. 2+ left popliteal pulse. Absent right popliteal pulse and absent distal pulses bilaterally. 2+ left brachial pulse and 1-2+ left radial pulse. Absent right brachial and radial pulse.  Diagnostic Studies:  Reviewed the CT scan from 01/23/2012 and compared this to October 2011 study.  The patient has extensive calcification in her aorta from her diaphragmatic hiatus distally into the iliac vessels. She has a complete circumferential calcification of her infrarenal aorta. There is extensive calcification which is occluding a significant amount of the lumen at the level of the spare mesenteric artery takeoff on the posterior wall. This does not appear to be dramatically changed from the October 2011 study. There is poor timing of the IV contrast on the current study making the actual flow lumen visualization difficult. She does have extensive calcification occlusive disease in [place] celiac and superior mesenteric arteries.  Medication Changes:  None  Assessment:  Chronic mesenteric ischemia with recent bout of probable ischemic colitis. Documentation of chronic aortic and mesenteric occlusive disease  Plan:  I discussed this at length with the patient. I have recommended arteriography for further evaluation and possible treatment. I explained that she would be at significantly high risk for aortic surgery but may have narrowing that is amenable to angioplasty. She is scheduled for diagnostic arteriogram tomorrow and possible angioplasty if indicated. I explained that this will be done by my partner Dr. Myra Gianotti. She does have mild renal insufficiency with creatinine in the 1.35 so therefore a contrast will be limited

## 2012-01-25 NOTE — Consult Note (Signed)
Chart was reviewed and patient was examined. X-rays were reviewed.   CT clearly demonstrates colonic wall thickening.  By distribution this likely is due to ischemic colitis.  Absence of ongoing pain mitigates against ongoing ischemia.  The usual presentation of ischemic colitis is bloody diarrhea, not pain, per se.  Nonetheless, there's no evidence for another active intraabdominal process.   Recommend colonoscopy.  Will schedule for 9/12.  Barbette Hair. Arlyce Dice, M.D., [facility] Cell [phone]

## 2012-01-26 DIAGNOSIS — N289 Disorder of kidney and ureter, unspecified: Secondary | ICD-10-CM

## 2012-01-26 DIAGNOSIS — I739 Peripheral vascular disease, unspecified: Secondary | ICD-10-CM

## 2012-01-26 DIAGNOSIS — K5289 Other specified noninfective gastroenteritis and colitis: Secondary | ICD-10-CM

## 2012-01-26 DIAGNOSIS — I5032 Chronic diastolic (congestive) heart failure: Secondary | ICD-10-CM

## 2012-01-26 DIAGNOSIS — K551 Chronic vascular disorders of intestine: Secondary | ICD-10-CM

## 2012-01-26 DIAGNOSIS — R197 Diarrhea, unspecified: Secondary | ICD-10-CM

## 2012-01-26 LAB — FERRITIN: Ferritin: 64 ng/mL (ref 10–291)

## 2012-01-26 LAB — GLUCOSE, CAPILLARY
Glucose-Capillary: 113 mg/dL — ABNORMAL HIGH (ref 70–99)
Glucose-Capillary: 102 mg/dL — ABNORMAL HIGH (ref 70–99)

## 2012-01-26 LAB — BASIC METABOLIC PANEL
BUN: 10 mg/dL (ref 6–23)
Chloride: 108 mEq/L (ref 96–112)
GFR calc non Af Amer: 40 mL/min — ABNORMAL LOW (ref >90)
Glucose, Bld: 102 mg/dL — ABNORMAL HIGH (ref 70–99)
Potassium: 4.2 mEq/L (ref 3.5–5.1)

## 2012-01-26 LAB — RETICULOCYTES: Retic Ct Pct: 1.1 % (ref 0.4–3.1)

## 2012-01-26 LAB — CBC
HCT: 33.1 % — ABNORMAL LOW (ref 36.0–46.0)
Hemoglobin: 11 g/dL — ABNORMAL LOW (ref 12.0–15.0)
MCHC: 33.2 g/dL (ref 30.0–36.0)

## 2012-01-26 LAB — IRON AND TIBC
Saturation Ratios: 25 % (ref 20–55)
TIBC: 247 ug/dL — ABNORMAL LOW (ref 250–470)

## 2012-01-26 MED ORDER — PEG-KCL-NACL-NASULF-NA ASC-C 100 G PO SOLR
1.0000 | Freq: Once | ORAL | Status: AC
  Administered 2012-01-26: 100 g via ORAL
  Filled 2012-01-26: qty 1

## 2012-01-26 MED ORDER — SODIUM CHLORIDE 0.9 % IV SOLN
INTRAVENOUS | Status: DC
  Administered 2012-01-27: 500 mL via INTRAVENOUS

## 2012-01-26 MED ORDER — BISACODYL 5 MG PO TBEC
5.0000 mg | DELAYED_RELEASE_TABLET | Freq: Once | ORAL | Status: AC
  Administered 2012-01-26: 5 mg via ORAL
  Filled 2012-01-26: qty 1

## 2012-01-26 NOTE — Progress Notes (Signed)
History of Present Illness:      Review of Systems: Pertinent positive and negative review of systems were noted in the above HPI section. All other review of systems were otherwise negative.    Current Medications, Allergies, Past Medical History, Past Surgical History, Family History and Social History were reviewed in [facility] electronic medical record  Vital signs were reviewed in today's medical record. Physical Exam: General: Well developed , well nourished, no acute distress Head: Normocephalic and atraumatic Eyes:  sclerae anicteric, EOMI Ears: Normal auditory acuity Mouth: No deformity or lesions Lungs: Clear throughout to auscultation Heart: Regular rate and rhythm; no murmurs, rubs or bruits Abdomen: Soft, non tender and non distended. No masses, hepatosplenomegaly or hernias noted. Normal Bowel sounds Rectal:deferred Musculoskeletal: Symmetrical with no gross deformities  Pulses:  Normal pulses noted Extremities: No clubbing, cyanosis, edema or deformities noted Neurological: Alert oriented x 4, grossly nonfocal Psychological:  Alert and cooperative. Normal mood and affect    

## 2012-01-26 NOTE — Progress Notes (Signed)
Subjective: Interval History: none.. comfortable sitting up in chair. She denies any abdominal pain.  Objective: Vital signs in last 24 hours: Temp:  [98 F (36.7 C)-98.4 F (36.9 C)] 98.4 F (36.9 C) (09/11 0835) Pulse Rate:  [60-69] 68  (09/11 0835) Resp:  [18] 18  (09/11 0835) BP: (112-170)/(53-78) 136/67 mmHg (09/11 0835) SpO2:  [93 %-98 %] 97 % (09/11 0835) Weight:  [128 lb (58.06 kg)] 128 lb (58.06 kg) (09/10 2109)  Intake/Output from previous day: 09/10 0701 - 09/11 0700 In: 720 [P.O.:720] Out: -  Intake/Output this shift: Total I/O In: 240 [P.O.:240] Out: -   Abdomen soft and nontender. The groin puncture without hematoma.  Lab Results:  Basename 01/26/12 0545 01/25/12 0602  WBC 8.5 8.0  HGB 11.0* 10.6*  HCT 33.1* 31.6*  PLT 274 245   BMET  Basename 01/26/12 0545 01/25/12 0602  NA 143 145  K 4.2 4.0  CL 108 110  CO2 26 28  GLUCOSE 102* 86  BUN 10 13  CREATININE 1.21* 1.23*  CALCIUM 8.7 8.5    Studies/Results: Ct Abdomen Pelvis Wo Contrast  01/23/2012  *RADIOLOGY REPORT*  Clinical Data:  Abdominal pain, diarrhea, nausea  CT ABDOMEN AND PELVIS WITHOUT CONTRAST  Technique:  Multidetector CT imaging of the abdomen and pelvis was performed following the standard protocol without intravenous contrast.  Comparison: Abdominal ultrasound performed today, 01/23/2012, most recent prior CT scan of the abdomen and pelvis 02/27/2010  Findings:  Lower Chest:  The pacing lead is identified in the right ventricular apex.  There is caseous calcification of the mitral valve annulus.  The visualized heart is within normal limits for size.  Trace dependent atelectasis.  The lung bases are otherwise clear  Abdomen: Evaluation of the abdominal organs and vasculature is significantly limited in the absence of intravenous contrast material.  Given the underlying limitations, the appearance of the stomach, duodenum, adrenal glands pancreas and spleen are unremarkable.  The spleen is  relatively small in size.  A nonspecific 9 mm hypoattenuating lesion in hepatic segment 4B is unchanged from prior and likely reflects a benign hepatic cyst. The gallbladder is surgically absent.  Minimal biliary ductal dilatation, unchanged from prior no hydronephrosis, or nephrolithiasis.  Prominent dromedary hump of the left kidney, unchanged from prior studies dating back to 2011.  Diffuse colonic wall thickening beginning in the region of the splenic flexure and extending down the descending colon to the proximal sigmoid colon. The degree of greatest wall thickening is noted at the splenic flexure and there is surrounding interstitial stranding in the pericolonic fat.  Scattered diverticula are noted the distal descending and sigmoid colon, however there is minimal to no inflammatory stranding around the diverticular to suggest diverticulosis.  Normal-caliber small bowel, no evidence of bowel obstruction.  No ascites, or suspicious adenopathy.  Pelvis: The bladder is distended with urine.  Evaluation the prostate gland limited by streak artifact from bilateral hip prosthesis.  No free fluid, or suspicious adenopathy.  Bones: No acute fracture or aggressive appearing lytic or blastic osseous lesion.  Incompletely imaged bilateral total hip arthroplasty prosthesis.  Evaluation the prosthesis limited by extensive streak artifact. Multilevel degenerative disc disease and advanced lower lumbar facet arthropathy. Levoconvex scoliosis of the lumbar spine centered at the L4.  Vascular: Significantly limited evaluation the absence of intravenous contrast material.  There is extensive calcified atherosclerotic vascular disease with a large mass-like calcification in the abdominal aorta at the level of the celiac origin which occupies at least  60% of the aortic lumen.  Diffuse calcifications involve the celiac artery and its branches, and the SMA origin. A metallic stent is noted in the proximal right common iliac artery  there is focal ectasia of the distal thoracic aorta which measures up to 3 cm at the aortic hiatus.  IMPRESSION:  1.  Marked colonic wall thickening involving the splenic flexure, descending colon and proximal sigmoid colon.  The process is most prominent at the splenic flexure where there is surrounding inflammatory change.  Given the location of the epicenter of the process at the SMA/IMA water shed, and the background of extensive underlying atherosclerotic vascular disease, ischemic colitis is favored.  Additional differential considerations include infectious, and inflammatory colitis.  2.  Extensive atherosclerotic vascular disease as detailed above. Evaluation is limited in the absence of intravenous contrast.  3. Additional ancillary findings as detailed above without significant interval change.   Original Report Authenticated By: Vilma Prader    US Abdomen Complete  01/23/2012  *RADIOLOGY REPORT*  Clinical Data:  Left-sided abdominal pain.  Diarrhea.  COMPLETE ABDOMINAL ULTRASOUND  Comparison:  CT abdomen and pelvis 02/27/2010  Findings:  Gallbladder:  Gallbladder is surgically absent.  Common bile duct:  Normal caliber with measured diameter of 4.5 mm.  Liver:  Normal homogeneous liver parenchymal echotexture.  Small cystic lesions measuring less than 1 cm diameter corresponding to cysts seen on previous CT.  IVC:  Appears normal.  Pancreas:  Pancreatic duct is upper limits of normal diameter, measuring 1.7 mm.  Diffuse pancreatic atrophy.  Stable since previous CT.  Spleen:  Spleen length measures 4.1 cm.  Normal homogeneous parenchymal echotexture.  Right Kidney:  Right kidney measures 8.6 cm length.  Diffuse parenchymal atrophy is suggested.  No hydronephrosis.  Left Kidney:  Left kidney measures 8.6 cm length.  Diffuse parenchymal atrophy.  No hydronephrosis.  Abdominal aorta:  No aneurysm identified.  IMPRESSION: Surgical absence of the gallbladder.  Small cysts in the liver. Prominent pancreatic duct  similar to previous CT study.  No acute changes identified.   Original Report Authenticated By: Marlon Pel, M.D.    Dg Chest Port 1 View  01/23/2012  *RADIOLOGY REPORT*  Clinical Data: Left lower quadrant abdominal pain.  PORTABLE CHEST - 1 VIEW  Comparison: 09/21/2010  Findings: Stable appearance of cardiac pacemaker.  Emphysematous changes and scattered fibrosis in the lungs.  Normal heart size and pulmonary vascularity.  No focal airspace consolidation in the lungs.  No blunting of costophrenic angles.  No pneumothorax. Mediastinal contours appear intact.  Calcification of the aorta. Calcified hilar lymph nodes.  Calcified granulomas in the lungs. Degenerative changes in the spine and shoulders.  Stable appearance since previous study.  IMPRESSION: Emphysematous changes and scattered fibrosis in the lungs.  No evidence of active pulmonary disease.  Stable appearance since previous study.   Original Report Authenticated By: Marlon Pel, M.D.    Anti-infectives: Anti-infectives    None      Assessment/Plan: s/p Procedure(s) (LRB) with comments: ABDOMINAL AORTAGRAM (N/A) Plans for colonoscopy noted. Again discussed with the patient she has patency of her celiac, superior mesenteric artery and inferior mesenteric artery vessels. No evidence of branch vessel mesenteric ischemia. We will not follow actively. Please consult [place] if we can provide further assistance.   LOS: 3 days   Trase Bunda 01/26/2012, 3:07 PM

## 2012-01-26 NOTE — Progress Notes (Signed)
PT Cancellation Note  Treatment cancelled today due to patient's refusal to participate.  Politely declined, stating, "I wish I could" -- reports is too tired.  Will follow up tomorrow as able (noted for colonoscopy), or Friday. Thanks,   Van Clines [facility] 01/26/2012, 3:16 PM

## 2012-01-26 NOTE — Progress Notes (Signed)
Patient ID: [name], female   DOB: July 06, 1923, 78 y.o.   MRN: [id number]  [facility] PROGRESS NOTE  [name] ION:[id number] DOB: August 18, 1923 DOA: 01/23/2012 PCP: Loreen Freud, DO  Brief narrative:  Pt is 78 yo female who presents with main concern of intermittent episodes of epigastric pain, that initially started several months prior to admission and has been associated with poor oral intake and weight loss of unknown amount. Pt reports that pain is sharp, intermittent in nature, 7/10 in severity when present, non radiating, aggravated by food and with no specific alleviating factors, associated with progressive weight loss. She denies any chest pain, shortness of breath, no urinary concerns. Admitted 09/08 for further evaluation of likely mesenteric ischemia.   Assessment and Plan:  Active Problems:  Abdominal pain  - associated with weight loss but unclear etiology at this time - arteriogram done today and all 3 mesenteric vessels patent - pt reports feeling better this AM and would like to try eating  - appreciate [facility] input and will continue to follow up on recommendations  - GI plans on colonoscopy tomorrow.  Acute on chronic renal failure  - with component of pre renal etiology  - creatinine is now at pt's baseline - hold Rampiril for now  - BMP in AM   Leukocytosis  - secondary to principal problem  - now resolved  - CBC in AM    HTN, accelerated - will add additional antihypertensive medicine to pt regimen, Clonidine - will continue Coreg but will increase the dose to 6.25 mg PO BID - monitor vitals per floor protocol; BP improved today.   Anemia - drop in Hg since admission - pt denies blood in stool - could be dilutional as well, will d/c IVF - anemia panel and CBC in AM  Consultants:  Dr. Tawanna Cooler Early with vascular surgery Dr. Melvia Heaps with GI  Procedures/Studies:  Ct Abdomen Pelvis Wo Contrast  01/23/2012  IMPRESSION:  1. Marked  colonic wall thickening involving the splenic flexure, descending colon and proximal sigmoid colon. The process is most prominent at the splenic flexure where there is surrounding inflammatory change. Given the location of the epicenter of the process at the SMA/IMA water shed, and the background of extensive underlying atherosclerotic vascular disease, ischemic colitis is favored. Additional differential considerations include infectious, and inflammatory colitis.  2. Extensive atherosclerotic vascular disease as detailed above. Evaluation is limited in the absence of intravenous contrast.  3. Additional ancillary findings as detailed above without significant interval change.   US Abdomen Complete  01/23/2012  IMPRESSION:  Surgical absence of the gallbladder. Small cysts in the liver.  Prominent pancreatic duct similar to previous CT study. No acute changes identified.   Dg Chest Port 1 View  01/23/2012  IMPRESSION:  Emphysematous changes and scattered fibrosis in the lungs. No evidence of active pulmonary disease.  Stable appearance since previous study.   Antibiotics:  None  Code Status: Full  Family Communication: Pt at bedside  Disposition Plan: Home when medically stable, PT done and no recommendations for further care   HPI/Subjective: No events overnight.   Objective: Filed Vitals:   01/25/12 1723 01/25/12 2109 01/26/12 0451 01/26/12 0835  BP: 112/78 170/53 168/67 136/67  Pulse: 69 60 60 68  Temp: 98.2 F (36.8 C) 98 F (36.7 C) 98.1 F (36.7 C) 98.4 F (36.9 C)  TempSrc: Oral Oral Oral Oral  Resp: 18 18 18 18   Height:  5\' 5"  (1.651 m)  Weight:  58.06 kg (128 lb)    SpO2: 98% 93% 96% 97%    Intake/Output Summary (Last 24 hours) at 01/26/12 1434 Last data filed at 01/26/12 0857  Gross per 24 hour  Intake    720 ml  Output      0 ml  Net    720 ml    Exam:   General:  Pt is alert, follows commands appropriately, not in acute distress  Cardiovascular:  Regular rate and rhythm, S1/S2, no murmurs, no rubs, no gallops  Respiratory: Clear to auscultation bilaterally, no wheezing, no crackles, no rhonchi  Abdomen: Soft, non tender, non distended, bowel sounds present, no guarding  Extremities: No edema, pulses DP and PT palpable bilaterally  Neuro: Grossly nonfocal  Data Reviewed: Basic Metabolic Panel:  Lab 01/26/12 8469 01/25/12 0602 01/24/12 0650 01/23/12 0118  NA 143 145 139 140  K 4.2 4.0 3.8 4.5  CL 108 110 104 100  CO2 26 28 26 29   GLUCOSE 102* 86 69* 112*  BUN 10 13 12 17   CREATININE 1.21* 1.23* 1.24* 1.83*  CALCIUM 8.7 8.5 8.1* 9.4  MG -- -- -- --  PHOS -- -- -- --   Liver Function Tests:  Lab 01/23/12 0118  AST 24  ALT 10  ALKPHOS 103  BILITOT 0.3  PROT 7.2  ALBUMIN 3.9    Lab 01/23/12 0118  LIPASE 11  AMYLASE --   No results found for this basename: AMMONIA:5 in the last 168 hours CBC:  Lab 01/26/12 0545 01/25/12 0602 01/24/12 0650 01/23/12 0118  WBC 8.5 8.0 7.8 15.2*  NEUTROABS -- -- -- 13.2*  HGB 11.0* 10.6* 11.2* 14.0  HCT 33.1* 31.6* 33.5* 41.3  MCV 95.9 95.8 95.2 95.4  PLT 274 245 274 326   CBG:  Lab 01/26/12 1244 01/26/12 0825 01/25/12 0941 01/25/12 0837 01/24/12 1116  GLUCAP 102* 113* 93 80 70    Recent Results (from the past 240 hour(s))  URINE CULTURE     Status: Normal   Collection Time   01/23/12  3:17 AM      Component Value Range Status Comment   Specimen Description URINE, CLEAN CATCH   Final    Special Requests NONE   Final    Culture  Setup Time 01/23/2012 13:58   Final    Colony Count 55,000 COLONIES/ML   Final    Culture     Final    Value: Multiple bacterial morphotypes present, none predominant. Suggest appropriate recollection if clinically indicated.   Report Status 01/25/2012 FINAL   Final      Scheduled Meds:    . aspirin  325 mg Oral Daily  . atorvastatin  40 mg Oral q1800  . bisacodyl  5 mg Oral Once  . carvedilol  6.25 mg Oral BID WC  . cloNIDine  0.2 mg  Oral BID  . digoxin  125 mcg Oral Daily  . docusate sodium  100 mg Oral BID  . feeding supplement  1 Container Oral TID BM  . mirtazapine  30 mg Oral QHS  . multivitamin with minerals  1 tablet Oral Daily  . nicotine  21 mg Transdermal Daily  . pantoprazole  40 mg Oral Q0600  . peg 3350 powder  1 kit Oral Once  . sodium chloride  3 mL Intravenous Q12H   Continuous Infusions:    . sodium chloride 10 mL/hr at 01/26/12 1127     Chaya Jan, MD  [facility] Pager [phone]  If 7PM-7AM, please contact night-coverage [email or web address] Password [email or web address] 01/26/2012, 2:34 PM   LOS: 3 days

## 2012-01-26 NOTE — Progress Notes (Signed)
     [facility] Daily Rounding Note 01/26/2012, 8:47 AM  SUBJECTIVE:       Denies pain in abdomen.  Eating solid breakfast.   OBJECTIVE:         Vital signs in last 24 hours:    Temp:  [98 F (36.7 C)-98.2 F (36.8 C)] 98.1 F (36.7 C) (09/11 0451) Pulse Rate:  [60-69] 60  (09/11 0451) Resp:  [18] 18  (09/11 0451) BP: (112-199)/(53-79) 168/67 mmHg (09/11 0451) SpO2:  [93 %-98 %] 96 % (09/11 0451) Weight:  [128 lb (58.06 kg)] 128 lb (58.06 kg) (09/10 2109) Last BM Date: 01/22/12 General: looks well   Heart: RRR Chest: clear B Abdomen: NT, active BS, soft  Extremities: no pedal edema. Neuro/Psych:  Alert, not confused, moves all 4s.   Lab Results:  Basename 01/26/12 0545 01/25/12 0602 01/24/12 0650  WBC 8.5 8.0 7.8  HGB 11.0* 10.6* 11.2*  HCT 33.1* 31.6* 33.5*  PLT 274 245 274   BMET  Basename 01/26/12 0545 01/25/12 0602 01/24/12 0650  NA 143 145 139  K 4.2 4.0 3.8  CL 108 110 104  CO2 26 28 26   GLUCOSE 102* 86 69*  BUN 10 13 12   CREATININE 1.21* 1.23* 1.24*  CALCIUM 8.7 8.5 8.1*   ASSESMENT: *  Colitis, ischemic.  Abdominal pain resolved *  Weight loss and anorexia. *  ASPVD *  Renal insufficiency.    PLAN: *  Colonoscopy on 01/27/12.  D/w pt, she is agreeable. `   LOS: 3 days   Jennye Moccasin  01/26/2012, 8:47 AM Pager: [phone]

## 2012-01-27 ENCOUNTER — Encounter (HOSPITAL_COMMUNITY)
Admission: EM | Disposition: A | Discharge status: Home / Self Care | Payer: Self-pay | Source: Home / Self Care | Attending: Internal Medicine

## 2012-01-27 ENCOUNTER — Encounter (HOSPITAL_COMMUNITY): Payer: Self-pay | Admitting: Gastroenterology

## 2012-01-27 DIAGNOSIS — K5730 Diverticulosis of large intestine without perforation or abscess without bleeding: Secondary | ICD-10-CM

## 2012-01-27 DIAGNOSIS — R109 Unspecified abdominal pain: Secondary | ICD-10-CM

## 2012-01-27 HISTORY — DX: Diverticulosis of large intestine without perforation or abscess without bleeding: K57.30

## 2012-01-27 HISTORY — PX: COLONOSCOPY: SHX5424

## 2012-01-27 SURGERY — COLONOSCOPY
Anesthesia: Moderate Sedation

## 2012-01-27 MED ORDER — FENTANYL CITRATE 0.05 MG/ML IJ SOLN
INTRAMUSCULAR | Status: AC
  Filled 2012-01-27: qty 2

## 2012-01-27 MED ORDER — ONDANSETRON HCL 4 MG PO TABS: 4.0000 mg | ORAL_TABLET | Freq: Four times a day (QID) | ORAL | Status: DC | PRN

## 2012-01-27 MED ORDER — FENTANYL CITRATE 0.05 MG/ML IJ SOLN
INTRAMUSCULAR | Status: DC | PRN
  Administered 2012-01-27 (×3): 25 ug via INTRAVENOUS

## 2012-01-27 MED ORDER — MIDAZOLAM HCL 5 MG/5ML IJ SOLN
INTRAMUSCULAR | Status: DC | PRN
  Administered 2012-01-27 (×2): 2.5 mg via INTRAVENOUS

## 2012-01-27 MED ORDER — CLONIDINE HCL 0.2 MG PO TABS: 0.2000 mg | ORAL_TABLET | Freq: Two times a day (BID) | ORAL | Status: DC

## 2012-01-27 MED ORDER — HYDROCODONE-ACETAMINOPHEN 5-325 MG PO TABS: 1.0000 | ORAL_TABLET | ORAL | Status: AC | PRN

## 2012-01-27 MED ORDER — MIDAZOLAM HCL 5 MG/ML IJ SOLN
INTRAMUSCULAR | Status: AC
  Filled 2012-01-27: qty 2

## 2012-01-27 MED ORDER — CARVEDILOL 6.25 MG PO TABS: 6.2500 mg | ORAL_TABLET | Freq: Two times a day (BID) | ORAL | Status: DC

## 2012-01-27 MED ORDER — ADULT MULTIVITAMIN W/MINERALS CH: 1.0000 | ORAL_TABLET | Freq: Every day | ORAL | Status: DC

## 2012-01-27 MED ORDER — ALUM & MAG HYDROXIDE-SIMETH 200-200-20 MG/5ML PO SUSP: 15.0000 mL | ORAL | Status: DC | PRN

## 2012-01-27 NOTE — Interval H&P Note (Signed)
History and Physical Interval Note:  01/27/2012 8:42 AM  [name]  has presented today for surgery, with the diagnosis of colitis  The various methods of treatment have been discussed with the patient and family. After consideration of risks, benefits and other options for treatment, the patient has consented to  Procedure(s) (LRB) with comments: COLONOSCOPY (N/A) as a surgical intervention .  The patient's history has been reviewed, patient examined, no change in status, stable for surgery.  I have reviewed the patient's chart and labs.  Questions were answered to the patient's satisfaction.    The recent H&P (dated **01/26/12 *) was reviewed, the patient was examined and there is no change in the patients condition since that H&P was completed.   Melvia Heaps  01/27/2012, 8:42 AM    Melvia Heaps

## 2012-01-27 NOTE — Progress Notes (Signed)
No mucosal abnormalities on colonoscopy.  Diverticulosis is seen.  Abdominal pain presumably was due to ischemic colitis.  In the absence of ongoing pain recommend no further GI w/u.  Signing off.

## 2012-01-27 NOTE — Progress Notes (Signed)
Pt left narcotic script in room. Telephoned pt at home to assess whether she could have someone pick it up. Pt request that I call her Nephew Rev. Guilford Shi @ [phone] to see if he can pick up for her. No answer at Mr. Theora Gianotti. Attempt to call Pt back at home to inform her x 3 with no answer. To readdress in the am.

## 2012-01-27 NOTE — Op Note (Signed)
[name] [address] [place], [address]   COLONOSCOPY PROCEDURE REPORT  PATIENT: [name], [name]  MR#: [id number] BIRTHDATE: [birth year] , 87  yrs. old GENDER: Female ENDOSCOPIST: Louis Meckel, MD REFERRED BY: PROCEDURE DATE:  01/27/2012 PROCEDURE:   Colonoscopy, diagnostic ASA CLASS:   Class II INDICATIONS:an abnormal CT. MEDICATIONS: These medications were titrated to patient response per physician's verbal order, Fentanyl 75 mcg IV, and Versed-Detailed 5 mg IV  DESCRIPTION OF PROCEDURE:   After the risks benefits and alternatives of the procedure were thoroughly explained, informed consent was obtained.  A digital rectal exam revealed external hemorrhoids.   The Pentax Colonoscope [id number]  endoscope was introduced through the anus and advanced to the cecum, which was identified by the ileocecal valve. No adverse events experienced. The quality of the prep was Moviprep fair  The instrument was then slowly withdrawn as the colon was fully examined.      COLON FINDINGS: Moderate diverticulosis was noted in the distal descending colon and proximal sigmoid colon.   The colon mucosa was otherwise normal.  Retroflexed views revealed no abnormalities. The time to cecum=  .  Withdrawal time=9 minutes 0 seconds.  The scope was withdrawn and the procedure completed. COMPLICATIONS: There were no complications.  ENDOSCOPIC IMPRESSION: 1.   Moderate diverticulosis was noted in the distal descending colon and proximal sigmoid colon 2.   The colon mucosa was otherwise normal  RECOMMENDATIONS:No further GI w/u   eSigned:  Louis Meckel, MD 01/27/2012 9:13 AM   cc:

## 2012-01-27 NOTE — Progress Notes (Signed)
Pt discharged to home. Pt. Is alert and oriented. Pt is hemodynamically stable. AVS reviewed with pt. Capable of re verbalizing medication regimen with daughter. Discharge plan appropriate and in place.

## 2012-01-27 NOTE — Discharge Summary (Addendum)
Physician Discharge Summary  Patient ID: [name] MRN: [id number] DOB/AGE: Aug 16, 1923 78 y.o.  Admit date: 01/23/2012 Discharge date: 02/10/2012  Primary Care Physician:  Ruthe Mannan, MD   Discharge Diagnoses:    Active Problems:  Nonspecific (abnormal) findings on radiological and other examination of gastrointestinal tract  Abdominal pain, generalized  Diverticulosis of colon (without mention of hemorrhage)  Mild protein-calorie malnutrition      Medication List     As of 02/10/2012 11:14 AM    STOP taking these medications         furosemide 20 MG tablet   Commonly known as: LASIX      meloxicam 7.5 MG tablet   Commonly known as: MOBIC      ramipril 5 MG capsule   Commonly known as: ALTACE      TAKE these medications         atorvastatin 40 MG tablet   Commonly known as: LIPITOR   Take 1 tablet (40 mg total) by mouth daily.      carvedilol 6.25 MG tablet   Commonly known as: COREG   Take 1 tablet (6.25 mg total) by mouth 2 (two) times daily with a meal.      digoxin 0.125 MG tablet   Commonly known as: LANOXIN   Take 1 tablet (125 mcg total) by mouth daily.      docusate sodium 100 MG capsule   Commonly known as: COLACE   Take 1 capsule (100 mg total) by mouth 2 (two) times daily.      metFORMIN 500 MG (MOD) 24 hr tablet   Commonly known as: GLUMETZA   Take 1 tablet (500 mg total) by mouth 2 (two) times daily with a meal.      mirtazapine 30 MG tablet   Commonly known as: REMERON   Take 1 tablet (30 mg total) by mouth at bedtime.      multivitamin with minerals Tabs   Take 1 tablet by mouth daily.      nitroGLYCERIN 0.4 MG SL tablet   Commonly known as: NITROSTAT   Place 0.4 mg under the tongue every 5 (five) minutes as needed.          Disposition and Follow-up:  Will be discharged home today in stable condition. To follow up with her PCP in 2 weeks. Small frequent meals throughout the day.  Consults:   GI Dr. Arlyce Dice, Vascular  surgery Dr. Arbie Cookey   Significant Diagnostic Studies:  CXR: IMPRESSION:  Emphysematous changes and scattered fibrosis in the lungs. No  evidence of active pulmonary disease. Stable appearance since  previous study.  CT Abd/Pelvis: IMPRESSION:  1. Marked colonic wall thickening involving the splenic flexure,  descending colon and proximal sigmoid colon. The process is most  prominent at the splenic flexure where there is surrounding  inflammatory change. Given the location of the epicenter of the  process at the SMA/IMA water shed, and the background of extensive  underlying atherosclerotic vascular disease, ischemic colitis is  favored. Additional differential considerations include  infectious, and inflammatory colitis.  2. Extensive atherosclerotic vascular disease as detailed above.  Evaluation is limited in the absence of intravenous contrast.  3. Additional ancillary findings as detailed above without  significant interval change.  Abd [place]: IMPRESSION:  Surgical absence of the gallbladder. Small cysts in the liver.  Prominent pancreatic duct similar to previous CT study. No acute  changes identified.  Colonoscopy: Diverticulosis without mucosal abnormalities.  Brief H and P: For  complete details please refer to admission H and P, but in brief patient is 78 yo female who presents with main concern of intermittent episodes of epigastric pain, that initially started several months prior to admission and has been associated with poor oral intake and weight loss of unknown amount. Pt reports that pain is sharp, intermittent in nature, 7/10 in severity when present, non radiating, aggravated by food and with no specific alleviating factors, associated with progressive weight loss. She denies any chest pain, shortness of breath, no urinary concerns. We were asked to admit her for further evaluation and management.     Hospital Course:  Active Problems:  Nonspecific (abnormal)  findings on radiological and other examination of gastrointestinal tract  Abdominal pain, generalized  Diverticulosis of colon (without mention of hemorrhage)  Mild protein-calorie malnutrition  Abdominal pain  - associated with weight loss. - arteriogram done today and all 3 mesenteric vessels patent, with extensive calcifications and moderate stenosis in the SMA. - pt reports feeling better this AM and would like to try eating.  - Colonoscopy without abnormality other than diverticulosis. -Likely related to mild mesenteric ischemia. -Patient counseled on eating small, frequent meals thruout the day.  Acute on chronic renal failure  - with component of pre renal etiology  - creatinine is now at pt's baseline  - hold Rampiril for now   HTN, accelerated  - will add additional antihypertensive medicine to pt regimen, Clonidine  - will continue Coreg but will increase the dose to 6.25 mg PO BID  - BP improved on DC with BP med changes.    Time spent on Discharge: Greater than 30 minutes.  SignedChaya Jan [facility] Pager: [phone] 02/10/2012, 11:14 AM

## 2012-01-28 ENCOUNTER — Encounter (HOSPITAL_COMMUNITY): Payer: Self-pay | Admitting: Gastroenterology

## 2012-01-28 ENCOUNTER — Encounter (HOSPITAL_COMMUNITY): Payer: Self-pay

## 2012-02-04 ENCOUNTER — Emergency Department (HOSPITAL_COMMUNITY): Payer: Medicare Other

## 2012-02-04 ENCOUNTER — Inpatient Hospital Stay (HOSPITAL_COMMUNITY)
Admission: EM | Admit: 2012-02-04 | Discharge: 2012-02-07 | DRG: 393 | Disposition: A | Discharge status: Home / Self Care | Payer: Medicare Other | Attending: Internal Medicine | Admitting: Internal Medicine

## 2012-02-04 ENCOUNTER — Ambulatory Visit (INDEPENDENT_AMBULATORY_CARE_PROVIDER_SITE_OTHER): Payer: Medicare Other | Admitting: Family Medicine

## 2012-02-04 ENCOUNTER — Telehealth: Payer: Self-pay | Admitting: Family Medicine

## 2012-02-04 ENCOUNTER — Encounter (HOSPITAL_COMMUNITY): Payer: Self-pay | Admitting: Physical Medicine and Rehabilitation

## 2012-02-04 ENCOUNTER — Inpatient Hospital Stay (HOSPITAL_COMMUNITY): Payer: Medicare Other

## 2012-02-04 DIAGNOSIS — J4489 Other specified chronic obstructive pulmonary disease: Secondary | ICD-10-CM | POA: Diagnosis present

## 2012-02-04 DIAGNOSIS — Z79899 Other long term (current) drug therapy: Secondary | ICD-10-CM

## 2012-02-04 DIAGNOSIS — R1084 Generalized abdominal pain: Secondary | ICD-10-CM

## 2012-02-04 DIAGNOSIS — R1012 Left upper quadrant pain: Secondary | ICD-10-CM

## 2012-02-04 DIAGNOSIS — R109 Unspecified abdominal pain: Secondary | ICD-10-CM

## 2012-02-04 DIAGNOSIS — K5730 Diverticulosis of large intestine without perforation or abscess without bleeding: Secondary | ICD-10-CM | POA: Diagnosis present

## 2012-02-04 DIAGNOSIS — I252 Old myocardial infarction: Secondary | ICD-10-CM

## 2012-02-04 DIAGNOSIS — Z7982 Long term (current) use of aspirin: Secondary | ICD-10-CM

## 2012-02-04 DIAGNOSIS — I129 Hypertensive chronic kidney disease with stage 1 through stage 4 chronic kidney disease, or unspecified chronic kidney disease: Secondary | ICD-10-CM | POA: Diagnosis present

## 2012-02-04 DIAGNOSIS — Z23 Encounter for immunization: Secondary | ICD-10-CM

## 2012-02-04 DIAGNOSIS — I5030 Unspecified diastolic (congestive) heart failure: Secondary | ICD-10-CM

## 2012-02-04 DIAGNOSIS — I509 Heart failure, unspecified: Secondary | ICD-10-CM | POA: Diagnosis present

## 2012-02-04 DIAGNOSIS — E785 Hyperlipidemia, unspecified: Secondary | ICD-10-CM | POA: Diagnosis present

## 2012-02-04 DIAGNOSIS — Z87891 Personal history of nicotine dependence: Secondary | ICD-10-CM

## 2012-02-04 DIAGNOSIS — K551 Chronic vascular disorders of intestine: Secondary | ICD-10-CM

## 2012-02-04 DIAGNOSIS — M129 Arthropathy, unspecified: Secondary | ICD-10-CM | POA: Diagnosis present

## 2012-02-04 DIAGNOSIS — N183 Chronic kidney disease, stage 3 unspecified: Secondary | ICD-10-CM | POA: Diagnosis present

## 2012-02-04 DIAGNOSIS — Z0279 Encounter for issue of other medical certificate: Secondary | ICD-10-CM

## 2012-02-04 DIAGNOSIS — J449 Chronic obstructive pulmonary disease, unspecified: Secondary | ICD-10-CM | POA: Diagnosis present

## 2012-02-04 DIAGNOSIS — K649 Unspecified hemorrhoids: Principal | ICD-10-CM | POA: Diagnosis present

## 2012-02-04 DIAGNOSIS — I779 Disorder of arteries and arterioles, unspecified: Secondary | ICD-10-CM

## 2012-02-04 DIAGNOSIS — K625 Hemorrhage of anus and rectum: Secondary | ICD-10-CM

## 2012-02-04 DIAGNOSIS — I2510 Atherosclerotic heart disease of native coronary artery without angina pectoris: Secondary | ICD-10-CM | POA: Diagnosis present

## 2012-02-04 DIAGNOSIS — Z96649 Presence of unspecified artificial hip joint: Secondary | ICD-10-CM

## 2012-02-04 DIAGNOSIS — I6529 Occlusion and stenosis of unspecified carotid artery: Secondary | ICD-10-CM | POA: Diagnosis present

## 2012-02-04 DIAGNOSIS — I495 Sick sinus syndrome: Secondary | ICD-10-CM | POA: Diagnosis present

## 2012-02-04 DIAGNOSIS — F411 Generalized anxiety disorder: Secondary | ICD-10-CM | POA: Diagnosis present

## 2012-02-04 DIAGNOSIS — J9600 Acute respiratory failure, unspecified whether with hypoxia or hypercapnia: Secondary | ICD-10-CM

## 2012-02-04 DIAGNOSIS — E119 Type 2 diabetes mellitus without complications: Secondary | ICD-10-CM | POA: Diagnosis present

## 2012-02-04 DIAGNOSIS — Z950 Presence of cardiac pacemaker: Secondary | ICD-10-CM

## 2012-02-04 DIAGNOSIS — K5731 Diverticulosis of large intestine without perforation or abscess with bleeding: Secondary | ICD-10-CM | POA: Diagnosis present

## 2012-02-04 DIAGNOSIS — I739 Peripheral vascular disease, unspecified: Secondary | ICD-10-CM

## 2012-02-04 DIAGNOSIS — Z96659 Presence of unspecified artificial knee joint: Secondary | ICD-10-CM

## 2012-02-04 DIAGNOSIS — I4891 Unspecified atrial fibrillation: Secondary | ICD-10-CM | POA: Diagnosis present

## 2012-02-04 DIAGNOSIS — I5032 Chronic diastolic (congestive) heart failure: Secondary | ICD-10-CM

## 2012-02-04 DIAGNOSIS — N189 Chronic kidney disease, unspecified: Secondary | ICD-10-CM

## 2012-02-04 LAB — BASIC METABOLIC PANEL
BUN: 17 mg/dL (ref 6–23)
Calcium: 9.1 mg/dL (ref 8.4–10.5)
Chloride: 102 mEq/L (ref 96–112)
Creatinine, Ser: 1.21 mg/dL — ABNORMAL HIGH (ref 0.50–1.10)
GFR calc Af Amer: 46 mL/min — ABNORMAL LOW (ref >90)
GFR calc non Af Amer: 40 mL/min — ABNORMAL LOW (ref >90)

## 2012-02-04 LAB — CBC WITH DIFFERENTIAL/PLATELET
Basophils Absolute: 0 10*3/uL (ref 0.0–0.1)
Basophils Relative: 0 % (ref 0–1)
Eosinophils Absolute: 0.1 10*3/uL (ref 0.0–0.7)
Eosinophils Relative: 1 % (ref 0–5)
HCT: 34.4 % — ABNORMAL LOW (ref 36.0–46.0)
MCH: 31.9 pg (ref 26.0–34.0)
MCHC: 33.4 g/dL (ref 30.0–36.0)
Monocytes Absolute: 0.6 10*3/uL (ref 0.1–1.0)
Monocytes Relative: 6 % (ref 3–12)
Neutro Abs: 7.9 10*3/uL — ABNORMAL HIGH (ref 1.7–7.7)
RDW: 15.3 % (ref 11.5–15.5)

## 2012-02-04 LAB — OCCULT BLOOD, POC DEVICE: Fecal Occult Bld: POSITIVE

## 2012-02-04 LAB — LACTIC ACID, PLASMA: Lactic Acid, Venous: 0.9 mmol/L (ref 0.5–2.2)

## 2012-02-04 MED ORDER — CARVEDILOL 6.25 MG PO TABS
6.2500 mg | ORAL_TABLET | Freq: Two times a day (BID) | ORAL | Status: DC
  Administered 2012-02-05: 6.25 mg via ORAL
  Filled 2012-02-04 (×3): qty 1

## 2012-02-04 MED ORDER — LABETALOL HCL 5 MG/ML IV SOLN
10.0000 mg | Freq: Once | INTRAVENOUS | Status: AC
  Administered 2012-02-04: 10 mg via INTRAVENOUS

## 2012-02-04 MED ORDER — ONDANSETRON HCL 4 MG/2ML IJ SOLN: 4.0000 mg | Freq: Four times a day (QID) | INTRAMUSCULAR | Status: DC | PRN

## 2012-02-04 MED ORDER — MORPHINE SULFATE 2 MG/ML IJ SOLN: 1.0000 mg | INTRAMUSCULAR | Status: DC | PRN

## 2012-02-04 MED ORDER — ADULT MULTIVITAMIN W/MINERALS CH
1.0000 | ORAL_TABLET | Freq: Every day | ORAL | Status: DC
  Administered 2012-02-05 – 2012-02-07 (×3): 1 via ORAL
  Filled 2012-02-04 (×3): qty 1

## 2012-02-04 MED ORDER — IOHEXOL 300 MG/ML  SOLN
20.0000 mL | INTRAMUSCULAR | Status: AC
  Administered 2012-02-04 (×2): 20 mL via ORAL

## 2012-02-04 MED ORDER — DIGOXIN 125 MCG PO TABS
125.0000 ug | ORAL_TABLET | Freq: Every day | ORAL | Status: DC
  Administered 2012-02-04 – 2012-02-05 (×2): 125 ug via ORAL
  Filled 2012-02-04 (×4): qty 1

## 2012-02-04 MED ORDER — ONDANSETRON HCL 4 MG PO TABS: 4.0000 mg | ORAL_TABLET | Freq: Four times a day (QID) | ORAL | Status: DC | PRN

## 2012-02-04 MED ORDER — ASPIRIN 81 MG PO TABS: 81.0000 mg | ORAL_TABLET | Freq: Every day | ORAL | Status: DC

## 2012-02-04 MED ORDER — DOCUSATE SODIUM 100 MG PO CAPS
100.0000 mg | ORAL_CAPSULE | Freq: Two times a day (BID) | ORAL | Status: DC
  Administered 2012-02-05 – 2012-02-07 (×6): 100 mg via ORAL
  Filled 2012-02-04 (×8): qty 1

## 2012-02-04 MED ORDER — HYDROMORPHONE HCL PF 1 MG/ML IJ SOLN: 1.0000 mg | INTRAMUSCULAR | Status: AC | PRN

## 2012-02-04 MED ORDER — SODIUM CHLORIDE 0.9 % IJ SOLN
3.0000 mL | Freq: Two times a day (BID) | INTRAMUSCULAR | Status: DC
  Administered 2012-02-05 – 2012-02-07 (×5): 3 mL via INTRAVENOUS

## 2012-02-04 MED ORDER — HYDRALAZINE HCL 20 MG/ML IJ SOLN
10.0000 mg | Freq: Four times a day (QID) | INTRAMUSCULAR | Status: DC | PRN
  Administered 2012-02-05: 10 mg via INTRAVENOUS
  Filled 2012-02-04 (×3): qty 0.5

## 2012-02-04 MED ORDER — HYDROCODONE-ACETAMINOPHEN 5-325 MG PO TABS: 1.0000 | ORAL_TABLET | ORAL | Status: DC | PRN

## 2012-02-04 MED ORDER — MIRTAZAPINE 30 MG PO TABS
30.0000 mg | ORAL_TABLET | Freq: Every day | ORAL | Status: DC
  Administered 2012-02-05 – 2012-02-06 (×3): 30 mg via ORAL
  Filled 2012-02-04 (×5): qty 1

## 2012-02-04 MED ORDER — ATORVASTATIN CALCIUM 40 MG PO TABS
40.0000 mg | ORAL_TABLET | Freq: Every day | ORAL | Status: DC
  Administered 2012-02-05 – 2012-02-07 (×4): 40 mg via ORAL
  Filled 2012-02-04 (×5): qty 1

## 2012-02-04 MED ORDER — ACETAMINOPHEN 325 MG PO TABS: 650.0000 mg | ORAL_TABLET | Freq: Four times a day (QID) | ORAL | Status: DC | PRN

## 2012-02-04 MED ORDER — SODIUM CHLORIDE 0.9 % IV SOLN
INTRAVENOUS | Status: AC
  Administered 2012-02-04: 17:00:00 via INTRAVENOUS

## 2012-02-04 MED ORDER — ONDANSETRON HCL 4 MG/2ML IJ SOLN: 4.0000 mg | Freq: Three times a day (TID) | INTRAMUSCULAR | Status: AC | PRN

## 2012-02-04 MED ORDER — ACETAMINOPHEN 650 MG RE SUPP: 650.0000 mg | Freq: Four times a day (QID) | RECTAL | Status: DC | PRN

## 2012-02-04 MED ORDER — ASPIRIN EC 81 MG PO TBEC
81.0000 mg | DELAYED_RELEASE_TABLET | Freq: Every day | ORAL | Status: DC
  Administered 2012-02-05 – 2012-02-07 (×4): 81 mg via ORAL
  Filled 2012-02-04 (×5): qty 1

## 2012-02-04 MED ORDER — LABETALOL HCL 5 MG/ML IV SOLN
INTRAVENOUS | Status: AC
  Filled 2012-02-04: qty 4

## 2012-02-04 MED ORDER — IOHEXOL 350 MG/ML SOLN
100.0000 mL | Freq: Once | INTRAVENOUS | Status: AC | PRN
  Administered 2012-02-04: 100 mL via INTRAVENOUS

## 2012-02-04 MED ORDER — CLONIDINE HCL 0.2 MG PO TABS
0.2000 mg | ORAL_TABLET | Freq: Two times a day (BID) | ORAL | Status: DC
  Administered 2012-02-04 – 2012-02-06 (×4): 0.2 mg via ORAL
  Filled 2012-02-04 (×5): qty 1

## 2012-02-04 NOTE — Consult Note (Signed)
Asked by ER Tommy Rainwater PA to evaluate pt for ischemic colitis.  78 y/o F recently admitted to the hospital approximately 10 days ago with abdominal pain and CT scan revealed thickening of her colon with probable ischemic colitis. She was evaluated for ischemic colitis by my partners Dr Arbie Cookey and Myra Gianotti.  Recent arteriogram by Dr Myra Gianotti showed 50-60% celiac, 60% SMA, 0% IMA stenosis.  It was Dr Bosie Helper impression that her recent colitis was not related to mesenteric occlusive disease. She presented to the ER today c/o bright red blood per rectum.  This was not really accompanied by abdominal pain.  She denies prior history of blood per rectum, melena, hemorrhoids or constipation.  The pt has no history of postprandial abdominal pain.  She does have a several year history of weight loss. This is been attributed to several events including possible depression. She does have a long history of diabetes. She does have extensive past medical history with peripheral vascular occlusive disease and coronary disease. She is status post right common iliac artery angioplasty and stenting in years past.  She has not been febrile.  She has had a productive cough.  Past Medical History  Diagnosis Date  . Coronary artery disease     acute MI 2003, nonstemi 2008  . MI (myocardial infarction) 2003  . PVD (peripheral vascular disease)   . History of atrial flutter 2009    ablation  . Tachy-brady syndrome   . Paroxysmal atrial fibrillation   . COPD (chronic obstructive pulmonary disease)   . Hyperlipidemia   . Diabetes mellitus   . Diastolic congestive heart failure   . Arthritis   . Chronic kidney disease   . Carotid artery disease   . SSS (sick sinus syndrome)     ablation 2009  . Pneumonia   . Anxiety     adjustment disorder   Past Surgical History  Procedure Date  . Cardiac catheterization     2003 cardiac stent  . Insert / replace / remove pacemaker 2009    [employer]  . Cholecystectomy   . Total  knee arthroplasty     bilateral  . Bilateral hip arthroscopy   . 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 Myocardial perfusion study     EF 59% 4/04  . Adenosine myoview study     abn EF 53% 12/04/04  . Carotid doppler 2/99  . Echo with lvh 1/98    old records  . Stress cardiolite 3/04    EF 53%  . Gamma knife radiosurg 08/25/06    [facility]  . Pvd     with stent in the right iliac   . Colonoscopy 01/27/2012    Procedure: COLONOSCOPY;  Surgeon: Louis Meckel, MD;  [facility] ENDOSCOPY;  Service: Endoscopy;  Laterality: N/A;   ROS: [x]  Positive   [ ]  Negative   [ ]  All sytems reviewed and are negative  General: [x ] Weight loss, [ ]  Fever, [ ]  chills Neurologic: [ ]  Dizziness, [ ]  Blackouts, [ ]  Seizure [ ]  Stroke, [ ]  "Mini stroke", [ ]  Slurred speech, [ ]  Temporary blindness; [ ]  weakness in arms or legs, [ ]  Hoarseness Cardiac: [ ]  Chest pain/pressure, [ ]  Shortness of breath at rest [x ] Shortness of breath with exertion, [x ] Atrial fibrillation or irregular heartbeat Vascular: [ ]  Pain in legs with walking, [ ]  Pain in legs at rest, [ ]  Pain in  legs at night,  [ ]  Non-healing ulcer, [ ]  Blood clot in vein/DVT,   Pulmonary: [ ]  Home oxygen, [x ] Productive cough, [ ]  Coughing up blood, [ ]  Asthma,  [ ]  Wheezing Musculoskeletal:  [x ] Arthritis, [ ]  Low back pain, [ ]  Joint pain Hematologic: [ ]  Easy Bruising, [ ]  Anemia; [ ]  Hepatitis Gastrointestinal: [x ] Blood in stool, [ ]  Gastroesophageal Reflux/heartburn, [ ]  Trouble swallowing Urinary: [ ]  chronic Kidney disease, [ ]  on HD - [ ]  MWF or [ ]  TTHS, [ ]  Burning with urination, [ ]  Difficulty urinating Skin: [ ]  Rashes, [ ]  Wounds Psychological: [x ] Anxiety, [x ] Depression  Social History History  Substance Use Topics  . Smoking status: Former Smoker -- 0.5 packs/day for 0 years    Types: Cigarettes    Quit date: 02/22/2011  . Smokeless tobacco: Never Used  . Alcohol Use: No      Family History Family History  Problem Relation Age of Onset  . Cancer Neg Hx   . Depression      ?  . Diabetes      DM and HBP- family hx    Allergies  Allergies  Allergen Reactions  . Naproxen     REACTION: gastritis     Current Facility-Administered Medications  Medication Dose Route Frequency Provider Last Rate Last Dose  . 0.9 %  sodium chloride infusion   Intravenous STAT Fayrene Helper, PA-C      . HYDROmorphone (DILAUDID) injection 1 mg  1 mg Intravenous Q4H PRN Fayrene Helper, PA-C      . iohexol (OMNIPAQUE) 300 MG/ML solution 20 mL  20 mL Oral Q1 Hr x 2 Medication Radiologist, MD   20 mL at 02/04/12 1645  . iohexol (OMNIPAQUE) 350 MG/ML injection 100 mL  100 mL Intravenous Once PRN Medication Radiologist, MD   100 mL at 02/04/12 1840  . ondansetron (ZOFRAN) injection 4 mg  4 mg Intravenous Q8H PRN Fayrene Helper, PA-C       Current Outpatient Prescriptions  Medication Sig Dispense Refill  . aspirin 81 MG tablet Take 81 mg by mouth daily.      [name] atorvastatin (LIPITOR) 40 MG tablet Take 1 tablet (40 mg total) by mouth daily.  30 tablet  6  . carvedilol (COREG) 6.25 MG tablet Take 1 tablet (6.25 mg total) by mouth 2 (two) times daily with a meal.  60 tablet  1  . cloNIDine (CATAPRES) 0.2 MG tablet Take 1 tablet (0.2 mg total) by mouth 2 (two) times daily.  60 tablet  1  . digoxin (LANOXIN) 0.125 MG tablet Take 1 tablet (125 mcg total) by mouth daily.  30 tablet  6  . docusate sodium (COLACE) 100 MG capsule Take 1 capsule (100 mg total) by mouth 2 (two) times daily.  60 capsule  4  . HYDROcodone-acetaminophen (NORCO/VICODIN) 5-325 MG per tablet Take 1-2 tablets by mouth every 4 (four) hours as needed.  30 tablet  0  . meloxicam (MOBIC) 7.5 MG tablet Take 1 tablet (7.5 mg total) by mouth daily.  30 tablet  0  . metFORMIN (GLUMETZA) 500 MG (MOD) 24 hr tablet Take 1 tablet (500 mg total) by mouth 2 (two) times daily with a meal.  60 tablet  6  . mirtazapine (REMERON) 30 MG tablet  Take 1 tablet (30 mg total) by mouth at bedtime.  30 tablet  11  . Multiple Vitamin (MULTIVITAMIN WITH MINERALS) TABS Take 1  tablet by mouth daily.      . nitroGLYCERIN (NITROSTAT) 0.4 MG SL tablet Place 0.4 mg under the tongue every 5 (five) minutes as needed.        . ondansetron (ZOFRAN) 4 MG tablet Take 4 mg by mouth every 8 (eight) hours as needed.         Physical Examination  Filed Vitals:   02/04/12 1303 02/04/12 1645  BP: 140/75 149/93  Pulse: 60 59  Temp: 98.2 F (36.8 C)   TempSrc: Oral   Resp: 20 19  SpO2: 99% 98%    There is no height or weight on file to calculate BMI.  General:  Alert and oriented, no acute distress, frail appearing HEENT: Normal Neck: No bruit or JVD Pulmonary: Clear to auscultation bilaterally Cardiac: Regular Rate and Rhythm Abdomen: Soft, non-tender, non-distended, no mass Skin: No rash Extremity Pulses:  2+ radial, brachial, femoral, absent dorsalis pedis, posterior tibial pulses bilaterally Musculoskeletal: No deformity trace edema  Neurologic: Upper and lower extremity motor 5/5 and symmetric  DATA: CT angio SMA celiac and IMA patent, no pneumatosis or small bowel wall edema Abundant stool in desc colon rectum  CBC    Component Value Date/Time   WBC 10.2 02/04/2012 1414   WBC 8.5 11/02/2011 1611   RBC 3.61* 02/04/2012 1414   RBC 3.89 11/02/2011 1611   HGB 11.5* 02/04/2012 1414   HCT 34.4* 02/04/2012 1414   PLT 304 02/04/2012 1414   MCV 95.3 02/04/2012 1414   MCH 31.9 02/04/2012 1414   MCH 31.9 11/02/2011 1611   MCHC 33.4 02/04/2012 1414   MCHC 33.8 11/02/2011 1611   RDW 15.3 02/04/2012 1414   RDW 14.7 11/02/2011 1611   LYMPHSABS 1.6 02/04/2012 1414   LYMPHSABS 1.7 11/02/2011 1611   MONOABS 0.6 02/04/2012 1414   EOSABS 0.1 02/04/2012 1414   EOSABS 0.2 11/02/2011 1611   BASOSABS 0.0 02/04/2012 1414   BASOSABS 0.0 11/02/2011 1611    BMET    Component Value Date/Time   NA 141 02/04/2012 1414   NA 140 11/02/2011 1611   K 4.5 02/04/2012 1414    CL 102 02/04/2012 1414   CO2 28 02/04/2012 1414   GLUCOSE 92 02/04/2012 1414   GLUCOSE 104* 11/02/2011 1611   BUN 17 02/04/2012 1414   BUN 20 11/02/2011 1611   CREATININE 1.21* 02/04/2012 1414   CALCIUM 9.1 02/04/2012 1414   GFRNONAA 40* 02/04/2012 1414   GFRAA 46* 02/04/2012 1414       ASSESSMENT: No evidence of acute mesenteric ischemia by clinical exam or on CT angio with all 3 mesenteric vessels filling and patent.  No leukocytosis.   PLAN:  This is not acute mesenteric ischemia and as per Dr Bosie Helper prior assessment doubt mesenteric occlusive disease is the cause of her current symptoms.  If recurrent ischemic colitis is suspected there is no current indication for vascular surgical intervention.  She has no abdominal pain to suggest impending colonic necrosis.  Would advise bowel rest IV fluids NPO and continued observation.    Fabienne Bruns, MD [facility] Office: [phone] Pager: [phone]

## 2012-02-04 NOTE — ED Provider Notes (Signed)
Patient placed in CDU by Fayrene Helper, PA-C awaiting admission for rectal bleed. Patient is here for rectal bleeding and diffuse abdominal pain and has received tablet workup with positive Hemoccult.   Plan per previous provider is to admit to MedSurg floor for further evaluation and management of her GI bleed.  Patient re-evaluated and is resting comfortable, VSS, with no new complaints or concerns at this time.  She denies abdominal pain at this time. On exam: hemodynamically stable, NAD, heart w/ RRR, lungs with diffuse rhonchi, Abd mildly tender to palpation, 2+ pitting peripheral edema,  no calf tenderness.    4:32pm  Patient assigned to an inpatient bed 5599.  Dierdre Forth, PA-C 02/04/12 1736   RN states patient is hypertensive.  Last blood pressure 234/65.  This was double checked in both arms.  Patient without signs of endorgan damage. Patient denies headache, chest pain, nausea, vomiting, shortness of breath. Labetalol 10 mg IV ordered.  Nurses on the floor notified.  Labetalol redosed and night clonidine given with a decrease in BP to  182/52. Dr. Effie Shy was consulted and asked that the charge nurse be involved with moving the patient.  Discussed the patient with the nurse practitioner, Everett Graff, who is written for when necessary hydralazine to help manage the patient's blood pressure. He states the patient should be moved to the floor at this time.  Dahlia Client Jacklynn Dehaas, PA-C 02/04/12 2321

## 2012-02-04 NOTE — ED Notes (Signed)
Pt presents to department via [facility] for evaluation of diffuse abdominal pain. Onset this morning. States she had bright red blood in bowel movement. No vomiting. Denies pain upon arrival. She is conscious alert and oriented x4.

## 2012-02-04 NOTE — ED Provider Notes (Signed)
History     CSN: [id number]  Arrival date & time 02/04/12  1254   First MD Initiated Contact with Patient 02/04/12 1256      Chief Complaint  Patient presents with  . Abdominal Pain    (Consider location/radiation/quality/duration/timing/severity/associated sxs/prior treatment) HPI  78 year old female with history of CAD, diabetes, and a recent hospital admission for treatment of colitis presents complaining of epigastric abdominal pain.  Pt was d/c from hospital 1 week ago for treatment of colitis.  She reports feeling normal until this AM when she experienced throbbing pain to her epigastric region.  She went to use the bath room and notice bright red blood when wipe.  Also noticed a few blood drops in the toilet bowl. Denies any rectal pain.  Denies lightheadedness, dizziness, cp, sob, cough, or back pain. No urinary sxs.  No fever, chills or rash.  No hx of rectal bleeding.  The bleeding concerns her, which prompted her to come to ER.   Pt is not on any blood thinner medication.    Past Medical History  Diagnosis Date  . Coronary artery disease     acute MI 2003, nonstemi 2008  . MI (myocardial infarction) 2003  . PVD (peripheral vascular disease)   . History of atrial flutter 2009    ablation  . Tachy-brady syndrome   . Paroxysmal atrial fibrillation   . COPD (chronic obstructive pulmonary disease)   . Hyperlipidemia   . Diabetes mellitus   . Diastolic congestive heart failure   . Arthritis   . Chronic kidney disease   . Carotid artery disease   . SSS (sick sinus syndrome)     ablation 2009  . Pneumonia   . Anxiety     adjustment disorder    Past Surgical History  Procedure Date  . Cardiac catheterization     2003 cardiac stent  . Insert / replace / remove pacemaker 2009    [employer]  . Cholecystectomy   . Total knee arthroplasty     bilateral  . Bilateral hip arthroscopy   . 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  Myocardial perfusion study     EF 59% 4/04  . Adenosine myoview study     abn EF 53% 12/04/04  . Carotid doppler 2/99  . Echo with lvh 1/98    old records  . Stress cardiolite 3/04    EF 53%  . Gamma knife radiosurg 08/25/06    [facility]  . Pvd     with stent in the right iliac   . Colonoscopy 01/27/2012    Procedure: COLONOSCOPY;  Surgeon: Louis Meckel, MD;  Location: [facility] ENDOSCOPY;  Service: Endoscopy;  Laterality: N/A;    Family History  Problem Relation Age of Onset  . Cancer Neg Hx   . Depression      ?  . Diabetes      DM and HBP- family hx    History  Substance Use Topics  . Smoking status: Former Smoker -- 0.5 packs/day for 0 years    Types: Cigarettes    Quit date: 02/22/2011  . Smokeless tobacco: Never Used  . Alcohol Use: No    OB History    Grav Para Term Preterm Abortions TAB SAB Ect Mult Living                  Review of Systems  All other systems reviewed and are negative.  Allergies  Naproxen  Home Medications   Current Outpatient Rx  Name Route Sig Dispense Refill  . ACETAMINOPHEN ER 650 MG PO TBCR Oral Take 1 tablet (650 mg total) by mouth every 8 (eight) hours as needed. 60 tablet 0  . ALUM & MAG HYDROXIDE-SIMETH 200-200-20 MG/5ML PO SUSP Oral Take 15-30 mLs by mouth every 2 (two) hours as needed for indigestion. 355 mL   . ASPIRIN 325 MG PO TABS Oral Take 325 mg by mouth daily.    . ATORVASTATIN CALCIUM 40 MG PO TABS Oral Take 1 tablet (40 mg total) by mouth daily. 30 tablet 6  . CARVEDILOL 6.25 MG PO TABS Oral Take 1 tablet (6.25 mg total) by mouth 2 (two) times daily with a meal. 60 tablet 1  . CLONIDINE HCL 0.2 MG PO TABS Oral Take 1 tablet (0.2 mg total) by mouth 2 (two) times daily. 60 tablet 1  . DIGOXIN 0.125 MG PO TABS Oral Take 1 tablet (125 mcg total) by mouth daily. 30 tablet 6  . DOCUSATE SODIUM 100 MG PO CAPS Oral Take 1 capsule (100 mg total) by mouth 2 (two) times daily. 60 capsule 4  . HYDROCODONE-ACETAMINOPHEN 5-325 MG  PO TABS Oral Take 1-2 tablets by mouth every 4 (four) hours as needed. 30 tablet 0  . CONTROL PADS EXTRA ABSORBENCY MISC  Use as directed. 20 each 11  . MELOXICAM 7.5 MG PO TABS Oral Take 1 tablet (7.5 mg total) by mouth daily. 30 tablet 0  . METFORMIN HCL ER (MOD) 500 MG PO TB24 Oral Take 1 tablet (500 mg total) by mouth 2 (two) times daily with a meal. 60 tablet 6  . MIRTAZAPINE 30 MG PO TABS Oral Take 1 tablet (30 mg total) by mouth at bedtime. 30 tablet 11  . ADULT MULTIVITAMIN W/MINERALS CH Oral Take 1 tablet by mouth daily.    [name] NITROGLYCERIN 0.4 MG SL SUBL Sublingual Place 0.4 mg under the tongue every 5 (five) minutes as needed.      [name] ONDANSETRON HCL 4 MG PO TABS Oral Take 1 tablet (4 mg total) by mouth every 6 (six) hours as needed for nausea. 20 tablet 0    There were no vitals taken for this visit.  Physical Exam  Nursing note and vitals reviewed. Constitutional: She appears well-developed and well-nourished. No distress.       Awake, alert, nontoxic appearance  HENT:  Head: Atraumatic.       Tongue with green/brown coating.  No blood  Eyes: Conjunctivae normal are normal. Right eye exhibits no discharge. Left eye exhibits no discharge.  Neck: Neck supple.  Cardiovascular: Normal rate and regular rhythm.   Pulmonary/Chest: Effort normal. No respiratory distress. She has rales. She exhibits no tenderness.       Scattered rhonchi noted to all lung fields  Abdominal: Soft. There is tenderness in the epigastric area. There is no rigidity, no rebound, no guarding, no CVA tenderness, no tenderness at McBurney's point and negative Murphy's sign. No hernia.  Genitourinary:       Chaperone present.  Frank blood noted in rectal vault, stool is brown, no melena.  Rectal exam is tender without obvious anal fissure or hemorrhoid noted.   Musculoskeletal: She exhibits no tenderness.       ROM appears intact, no obvious focal weakness.  Bilateral lower extremity edema 1+, with palpable pedal  pulse, no calves tenderness, cord, erythema, neg homan's sign.    Neurological: She is alert.  Mental status and motor strength appears intact  Skin: No rash noted.  Psychiatric: She has a normal mood and affect.    ED Course  Procedures (including critical care time)  Labs Reviewed - No data to display No results found.   No diagnosis found.   Date: 02/04/2012  Rate: 69  Rhythm: normal sinus rhythm  QRS Axis: normal  Intervals: normal  ST/T Wave abnormalities: nonspecific ST/T changes  Conduction Disutrbances:none  Narrative Interpretation: flattening of T-waves in lateral leads  Old EKG Reviewed: changes noted  Results for orders placed during the hospital encounter of 02/04/12  CBC WITH DIFFERENTIAL      Component Value Range   WBC 10.2  4.0 - 10.5 K/uL   RBC 3.61 (*) 3.87 - 5.11 MIL/uL   Hemoglobin 11.5 (*) 12.0 - 15.0 g/dL   HCT 16.1 (*) 09.6 - 04.5 %   MCV 95.3  78.0 - 100.0 fL   MCH 31.9  26.0 - 34.0 pg   MCHC 33.4  30.0 - 36.0 g/dL   RDW 40.9  81.1 - 91.4 %   Platelets 304  150 - 400 K/uL   Neutrophils Relative 78 (*) 43 - 77 %   Neutro Abs 7.9 (*) 1.7 - 7.7 K/uL   Lymphocytes Relative 15  12 - 46 %   Lymphs Abs 1.6  0.7 - 4.0 K/uL   Monocytes Relative 6  3 - 12 %   Monocytes Absolute 0.6  0.1 - 1.0 K/uL   Eosinophils Relative 1  0 - 5 %   Eosinophils Absolute 0.1  0.0 - 0.7 K/uL   Basophils Relative 0  0 - 1 %   Basophils Absolute 0.0  0.0 - 0.1 K/uL  BASIC METABOLIC PANEL      Component Value Range   Sodium 141  135 - 145 mEq/L   Potassium 4.5  3.5 - 5.1 mEq/L   Chloride 102  96 - 112 mEq/L   CO2 28  19 - 32 mEq/L   Glucose, Bld 92  70 - 99 mg/dL   BUN 17  6 - 23 mg/dL   Creatinine, Ser 7.82 (*) 0.50 - 1.10 mg/dL   Calcium 9.1  8.4 - 95.6 mg/dL   GFR calc non Af Amer 40 (*) >90 mL/min   GFR calc Af Amer 46 (*) >90 mL/min  PROTIME-INR      Component Value Range   Prothrombin Time 13.5  11.6 - 15.2 seconds   INR 1.04  0.00 - 1.49  OCCULT  BLOOD, POC DEVICE      Component Value Range   Fecal Occult Bld POSITIVE    LACTIC ACID, PLASMA      Component Value Range   Lactic Acid, Venous 0.9  0.5 - 2.2 mmol/L   Ct Abdomen Pelvis Wo Contrast  01/23/2012  *RADIOLOGY REPORT*  Clinical Data:  Abdominal pain, diarrhea, nausea  CT ABDOMEN AND PELVIS WITHOUT CONTRAST  Technique:  Multidetector CT imaging of the abdomen and pelvis was performed following the standard protocol without intravenous contrast.  Comparison: Abdominal ultrasound performed today, 01/23/2012, most recent prior CT scan of the abdomen and pelvis 02/27/2010  Findings:  Lower Chest:  The pacing lead is identified in the right ventricular apex.  There is caseous calcification of the mitral valve annulus.  The visualized heart is within normal limits for size.  Trace dependent atelectasis.  The lung bases are otherwise clear  Abdomen: Evaluation of the abdominal organs and vasculature is significantly limited  in the absence of intravenous contrast material.  Given the underlying limitations, the appearance of the stomach, duodenum, adrenal glands pancreas and spleen are unremarkable.  The spleen is relatively small in size.  A nonspecific 9 mm hypoattenuating lesion in hepatic segment 4B is unchanged from prior and likely reflects a benign hepatic cyst. The gallbladder is surgically absent.  Minimal biliary ductal dilatation, unchanged from prior no hydronephrosis, or nephrolithiasis.  Prominent dromedary hump of the left kidney, unchanged from prior studies dating back to 2011.  Diffuse colonic wall thickening beginning in the region of the splenic flexure and extending down the descending colon to the proximal sigmoid colon. The degree of greatest wall thickening is noted at the splenic flexure and there is surrounding interstitial stranding in the pericolonic fat.  Scattered diverticula are noted the distal descending and sigmoid colon, however there is minimal to no inflammatory  stranding around the diverticular to suggest diverticulosis.  Normal-caliber small bowel, no evidence of bowel obstruction.  No ascites, or suspicious adenopathy.  Pelvis: The bladder is distended with urine.  Evaluation the prostate gland limited by streak artifact from bilateral hip prosthesis.  No free fluid, or suspicious adenopathy.  Bones: No acute fracture or aggressive appearing lytic or blastic osseous lesion.  Incompletely imaged bilateral total hip arthroplasty prosthesis.  Evaluation the prosthesis limited by extensive streak artifact. Multilevel degenerative disc disease and advanced lower lumbar facet arthropathy. Levoconvex scoliosis of the lumbar spine centered at the L4.  Vascular: Significantly limited evaluation the absence of intravenous contrast material.  There is extensive calcified atherosclerotic vascular disease with a large mass-like calcification in the abdominal aorta at the level of the celiac origin which occupies at least 60% of the aortic lumen.  Diffuse calcifications involve the celiac artery and its branches, and the SMA origin. A metallic stent is noted in the proximal right common iliac artery there is focal ectasia of the distal thoracic aorta which measures up to 3 cm at the aortic hiatus.  IMPRESSION:  1.  Marked colonic wall thickening involving the splenic flexure, descending colon and proximal sigmoid colon.  The process is most prominent at the splenic flexure where there is surrounding inflammatory change.  Given the location of the epicenter of the process at the SMA/IMA water shed, and the background of extensive underlying atherosclerotic vascular disease, ischemic colitis is favored.  Additional differential considerations include infectious, and inflammatory colitis.  2.  Extensive atherosclerotic vascular disease as detailed above. Evaluation is limited in the absence of intravenous contrast.  3. Additional ancillary findings as detailed above without significant  interval change.   Original Report Authenticated By: Alvino Blood Chest 2 View  02/04/2012  *RADIOLOGY REPORT*  Clinical Data: Cough.  Rhonchi on physical examination.  CHEST - 2 VIEW  Comparison: Portable chest x-ray 01/23/2012, 09/21/2010, 03/01/2010.  Two-view chest x-ray 12/04/2009.  Findings: Cardiac silhouette mildly enlarged but stable.  The subclavian dual lead transvenous pacemaker unchanged appears intact.  Mitral annular calcification.  Thoracic aorta mildly tortuous atherosclerotic, unchanged.  Hilar and mediastinal contours otherwise unremarkable.  Lungs hyperinflated but clear. No visible pleural effusions.  Degenerative changes involving the thoracic spine.  Degenerative changes in both shoulder joints, right greater than left.  IMPRESSION: Stable mild cardiomegaly.  Hyperinflation consistent with COPD and/or asthma.  No acute cardiopulmonary disease.   Original Report Authenticated By: Arnell Sieving, M.D.    US Abdomen Complete  01/23/2012  *RADIOLOGY REPORT*  Clinical Data:  Left-sided abdominal pain.  Diarrhea.  COMPLETE ABDOMINAL ULTRASOUND  Comparison:  CT abdomen and pelvis 02/27/2010  Findings:  Gallbladder:  Gallbladder is surgically absent.  Common bile duct:  Normal caliber with measured diameter of 4.5 mm.  Liver:  Normal homogeneous liver parenchymal echotexture.  Small cystic lesions measuring less than 1 cm diameter corresponding to cysts seen on previous CT.  IVC:  Appears normal.  Pancreas:  Pancreatic duct is upper limits of normal diameter, measuring 1.7 mm.  Diffuse pancreatic atrophy.  Stable since previous CT.  Spleen:  Spleen length measures 4.1 cm.  Normal homogeneous parenchymal echotexture.  Right Kidney:  Right kidney measures 8.6 cm length.  Diffuse parenchymal atrophy is suggested.  No hydronephrosis.  Left Kidney:  Left kidney measures 8.6 cm length.  Diffuse parenchymal atrophy.  No hydronephrosis.  Abdominal aorta:  No aneurysm identified.  IMPRESSION: Surgical  absence of the gallbladder.  Small cysts in the liver. Prominent pancreatic duct similar to previous CT study.  No acute changes identified.   Original Report Authenticated By: Marlon Pel, M.D.    Dg Chest Port 1 View  01/23/2012  *RADIOLOGY REPORT*  Clinical Data: Left lower quadrant abdominal pain.  PORTABLE CHEST - 1 VIEW  Comparison: 09/21/2010  Findings: Stable appearance of cardiac pacemaker.  Emphysematous changes and scattered fibrosis in the lungs.  Normal heart size and pulmonary vascularity.  No focal airspace consolidation in the lungs.  No blunting of costophrenic angles.  No pneumothorax. Mediastinal contours appear intact.  Calcification of the aorta. Calcified hilar lymph nodes.  Calcified granulomas in the lungs. Degenerative changes in the spine and shoulders.  Stable appearance since previous study.  IMPRESSION: Emphysematous changes and scattered fibrosis in the lungs.  No evidence of active pulmonary disease.  Stable appearance since previous study.   Original Report Authenticated By: Marlon Pel, M.D.    1. Rectal bleeding 2. Abdominal pain 3. ECG changes   MDM  Epigastric abd pain and BRBPR since today.  Was recently admitted to hospital for ischemic colitis 2 weeks ago.  Had colonoscopy and arteriogram done which shows evidence of mild mesenteric ischemia during her admission.  Since pt has worsening abd pain and new rectal bleeding, Work up initiated.    Pt has lateral T wave flattening on ECG which is new compare to prior.  WIll check labs and will obtain abd/pelvic CT for further evaluation.  Pt will likely need admission for further management  3:52 PM Pt report given to CDU PA and Dr. Effie Shy.  Pt currently awaits abd/pelvic CT scan.  Pt will need to be consulted with [facility] for admission due to rectal bleeding, increase abdominal pain, and ECG changes.     BP 140/75  Pulse 60  Temp 98.2 F (36.8 C) (Oral)  Resp 20  SpO2 99%  Nursing notes  reviewed and considered in documentation  Previous records reviewed and considered  All labs/vitals reviewed and considered  xrays reviewed and considered  4:30 PM i have consulted with [facility], who recommend admit pt to med surg, team 10, Dr. Virginia Rochester.  Will cancel CT scan at this time.    Fayrene Helper, PA-C 02/04/12 1633

## 2012-02-04 NOTE — Progress Notes (Signed)
Disposition Note  Willma Obando, is a 78 y.o. female,   MRN: [id number]  -  DOB - 03-26-1924  Outpatient Primary MD for the patient is Ruthe Mannan, MD Gastroenterologist:  Dr. Melvia Heaps Vascular Surgeon:  Dr. Coral Else  Blood pressure 140/75, pulse 60, temperature 98.2 F (36.8 C), temperature source Oral, resp. rate 20, SpO2 99.00%.  Active Problems:  Bright red blood per rectum  Diverticulosis of colon (without mention of hemorrhage)  PVD (peripheral vascular disease)  Tachy-brady syndrome  COPD (chronic obstructive pulmonary disease)  Hyperlipidemia  Diastolic congestive heart failure  Chronic kidney disease  Carotid artery disease   78 yo female with recent hospitalization for Abdominal pain diagnosed with ischemic colitis - likely mesenteric ischemia.  She has been undergoing work up with Dr. Myra Gianotti including a diagnostic arteriogram done 9/13.  She presents to the ED today with new onset of BRBPR (in small amounts) in addition to abdominal pain. Colonoscopy done 9/12 by Dr. Melvia Heaps and was essentially normal with the exception of diverticulosis.  Hbg is 11.5.  Up from 11.0 on 01/26/12.  Further the ED reports hearing rhonchi in all lung fields.  CXR shows hyperinflation consistent with COPD or asthma.  As the patient is hemodynamically stable I have request a med surg bed.  I have also consulted Vascular for their most recent assessment/information as I believe they have seen her as an outpatient since discharge.   Algis Downs, PA-C [facility] Pager: [phone]

## 2012-02-04 NOTE — Telephone Encounter (Signed)
Message copied by Verner Chol on Fri Feb 04, 2012 12:05 PM ------      Message from: Almeta Monas P      Created: Wed Feb 02, 2012  1:46 PM      Regarding: RE: ED f/u        This is a patient of Dr.Talia Dayton Martes.   :)      ----- Message -----         From: Theodis Sato McDaniels         Sent: 02/02/2012  10:59 AM           To: Arnette Norris, CMA      Subject: ED f/u                                                   Need approval for 3pm slot put patient in for post hospital f/u 2-wks from 9.8.13      appt made for 9.26.13 3pm       Also it appears dr.lowne has not seen her in a while      If I need to move let me know

## 2012-02-04 NOTE — ED Notes (Signed)
Pt presents to department for evaluation of diffuse abdominal pain and bright red blood in stool. Onset of symptoms this morning. 5/10 abdominal pain at the time. No vomiting, states intermittent nausea. Skin warm and dry. Rhonchi heard all lung fields. Pt is conscious alert and oriented x4.

## 2012-02-04 NOTE — ED Notes (Signed)
Patient transported to CT 

## 2012-02-04 NOTE — ED Notes (Signed)
PA paging admitting MD about BP.

## 2012-02-04 NOTE — ED Notes (Signed)
RT to bedside

## 2012-02-04 NOTE — H&P (Signed)
[facility] History and Physical  [name] ZHY:[id number] DOB: 1924-04-06 DOA: 02/04/2012  Referring physician: Fayrene Helper, PA-C  PCP: Ruthe Mannan, MD   Chief Complaint: abdominal pain  HPI: [name] is a 78 y.o. female with past medical history of coronary artery disease, chronic CHF and peripheral vascular disease. Patient came in to the hospital because of left sided abdominal pain. Patient said she was in her usual state of health till this morning when she developed left upper quadrant abdominal pain, she rated the pain as 5/10, sharp, does not radiate to any area and she did not remember anything increases or decreases the pain. Patient had similar episode recently when she was admitted to the hospital because of that and she was diagnosed with mesenteric ischemia. Upon initial evaluation in the emergency department patient also mentioned that she have bright red blood per rectum, she noticed that when she was wiping after bowel movement today. She did not see a lot of blood in the pelvic bowl. Vascular surgery evaluated the patient last time, a consultation was done also this time.  Review of Systems:  Constitutional: generalized weakness and fatigability Eyes: negative for irritation, redness and visual disturbance Ears, nose, mouth, throat, and face: negative for earaches, epistaxis, nasal congestion and sore throat Respiratory: negative for cough, dyspnea on exertion, sputum and wheezing Cardiovascular: negative for chest pain, dyspnea, lower extremity edema, orthopnea, palpitations and syncope Gastrointestinal: LUQ abdominal pain, bright red blood per rectum Genitourinary:negative for dysuria, frequency and hematuria Hematologic/lymphatic: negative for bleeding, easy bruising and lymphadenopathy Musculoskeletal:negative for arthralgias, muscle weakness and stiff joints Neurological: negative for coordination problems, gait problems, headaches and  weakness Endocrine: negative for diabetic symptoms including polydipsia, polyuria and weight loss Allergic/Immunologic: negative for anaphylaxis, hay fever and urticaria  Past Medical History  Diagnosis Date  . Coronary artery disease     acute MI 2003, nonstemi 2008  . MI (myocardial infarction) 2003  . PVD (peripheral vascular disease)   . History of atrial flutter 2009    ablation  . Tachy-brady syndrome   . Paroxysmal atrial fibrillation   . COPD (chronic obstructive pulmonary disease)   . Hyperlipidemia   . Diabetes mellitus   . Diastolic congestive heart failure   . Arthritis   . Chronic kidney disease   . Carotid artery disease   . SSS (sick sinus syndrome)     ablation 2009  . Pneumonia   . Anxiety     adjustment disorder   Past Surgical History  Procedure Date  . Cardiac catheterization     2003 cardiac stent  . Insert / replace / remove pacemaker 2009    [employer]  . Cholecystectomy   . Total knee arthroplasty     bilateral  . Bilateral hip arthroscopy   . 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 Myocardial perfusion study     EF 59% 4/04  . Adenosine myoview study     abn EF 53% 12/04/04  . Carotid doppler 2/99  . Echo with lvh 1/98    old records  . Stress cardiolite 3/04    EF 53%  . Gamma knife radiosurg 08/25/06    [facility]  . Pvd     with stent in the right iliac   . Colonoscopy 01/27/2012    Procedure: COLONOSCOPY;  Surgeon: Louis Meckel, MD;  Location: [facility] END[facility]COPY;  Service: Endoscopy;  Laterality: N/A;   Social History:  reports  that she quit smoking about a year ago. Her smoking use included Cigarettes. She smoked .5 packs per day for 0 years. She has never used smokeless tobacco. She reports that she does not drink alcohol or use illicit drugs.   Allergies  Allergen Reactions  . Naproxen     REACTION: gastritis    Family History  Problem Relation Age of Onset  . Cancer Neg Hx   . Depression       ?  . Diabetes      DM and HBP- family hx    Prior to Admission medications   Medication Sig Start Date End Date Taking? Authorizing Provider  aspirin 81 MG tablet Take 81 mg by mouth daily.   Yes Historical Provider, MD  atorvastatin (LIPITOR) 40 MG tablet Take 1 tablet (40 mg total) by mouth daily. 05/20/11  Yes Dianne Dun, MD  carvedilol (COREG) 6.25 MG tablet Take 1 tablet (6.25 mg total) by mouth 2 (two) times daily with a meal. 01/27/12 01/26/13 Yes Estela Isaiah Blakes, MD  cloNIDine (CATAPRES) 0.2 MG tablet Take 1 tablet (0.2 mg total) by mouth 2 (two) times daily. 01/27/12 01/26/13 Yes Estela Isaiah Blakes, MD  digoxin (LANOXIN) 0.125 MG tablet Take 1 tablet (125 mcg total) by mouth daily. 01/03/12  Yes Dianne Dun, MD  docusate sodium (COLACE) 100 MG capsule Take 1 capsule (100 mg total) by mouth 2 (two) times daily. 10/13/11  Yes Dianne Dun, MD  HYDROcodone-acetaminophen (NORCO/VICODIN) 5-325 MG per tablet Take 1-2 tablets by mouth every 4 (four) hours as needed. 01/27/12 02/06/12 Yes Estela Isaiah Blakes, MD  meloxicam (MOBIC) 7.5 MG tablet Take 1 tablet (7.5 mg total) by mouth daily. 01/03/12  Yes Dianne Dun, MD  metFORMIN (GLUMETZA) 500 MG (MOD) 24 hr tablet Take 1 tablet (500 mg total) by mouth 2 (two) times daily with a meal. 07/19/11  Yes Dianne Dun, MD  mirtazapine (REMERON) 30 MG tablet Take 1 tablet (30 mg total) by mouth at bedtime. 11/09/11  Yes Dianne Dun, MD  Multiple Vitamin (MULTIVITAMIN WITH MINERALS) TABS Take 1 tablet by mouth daily. 01/27/12  Yes Estela Isaiah Blakes, MD  nitroGLYCERIN (NITROSTAT) 0.4 MG SL tablet Place 0.4 mg under the tongue every 5 (five) minutes as needed.     Yes Historical Provider, MD  ondansetron (ZOFRAN) 4 MG tablet Take 4 mg by mouth every 8 (eight) hours as needed.   Yes Historical Provider, MD   Physical Exam: Filed Vitals:   02/04/12 1303 02/04/12 1645  BP: 140/75 149/93  Pulse: 60 59  Temp: 98.2 F (36.8 C)    TempSrc: Oral   Resp: 20 19  SpO2: 99% 98%   General appearance: alert, cooperative and no distress  Head: Normocephalic, without obvious abnormality, atraumatic  Eyes: conjunctivae/corneas clear. PERRL, EOM's intact. Fundi benign.  Nose: Nares normal. Septum midline. Mucosa normal. No drainage or sinus tenderness.  Throat: lips, mucosa, and tongue normal; teeth and gums normal  Neck: Supple, no masses, no cervical lymphadenopathy, no JVD appreciated, no meningeal signs Resp: clear to auscultation bilaterally  Chest wall: no tenderness  Cardio: regular rate and rhythm, S1, S2 normal, no murmur, click, rub or gallop  GI: soft, moderate tenderness in the left flank Extremities: extremities normal, atraumatic, no cyanosis or edema  Skin: Skin color, texture, turgor normal. No rashes or lesions  Neurologic: Alert and oriented X 3, normal strength and tone. Normal symmetric reflexes. Normal coordination  and gait  Labs on Admission:  Basic Metabolic Panel:  Lab 02/04/12 1610  NA 141  K 4.5  CL 102  CO2 28  GLUCOSE 92  BUN 17  CREATININE 1.21*  CALCIUM 9.1  MG --  PHOS --   Liver Function Tests: No results found for this basename: AST:5,ALT:5,ALKPHOS:5,BILITOT:5,PROT:5,ALBUMIN:5 in the last 168 hours No results found for this basename: LIPASE:5,AMYLASE:5 in the last 168 hours No results found for this basename: AMMONIA:5 in the last 168 hours CBC:  Lab 02/04/12 1414  WBC 10.2  NEUTROABS 7.9*  HGB 11.5*  HCT 34.4*  MCV 95.3  PLT 304   Cardiac Enzymes:  Lab 02/04/12 1627  CKTOTAL --  CKMB --  CKMBINDEX --  TROPONINI <0.30    BNP (last 3 results)  Basename 05/31/11 1242  PROBNP 542.0*   CBG: No results found for this basename: GLUCAP:5 in the last 168 hours  Radiological Exams on Admission: Dg Chest 2 View  02/04/2012  *RADIOLOGY REPORT*  Clinical Data: Cough.  Rhonchi on physical examination.  CHEST - 2 VIEW  Comparison: Portable chest x-ray 01/23/2012,  09/21/2010, 03/01/2010.  Two-view chest x-ray 12/04/2009.  Findings: Cardiac silhouette mildly enlarged but stable.  The subclavian dual lead transvenous pacemaker unchanged appears intact.  Mitral annular calcification.  Thoracic aorta mildly tortuous atherosclerotic, unchanged.  Hilar and mediastinal contours otherwise unremarkable.  Lungs hyperinflated but clear. No visible pleural effusions.  Degenerative changes involving the thoracic spine.  Degenerative changes in both shoulder joints, right greater than left.  IMPRESSION: Stable mild cardiomegaly.  Hyperinflation consistent with COPD and/or asthma.  No acute cardiopulmonary disease.   Original Report Authenticated By: Arnell Sieving, M.D.     EKG: Independently reviewed.   Assessment/Plan Principal Problem:  *Bright red blood per rectum Active Problems:  Diverticulosis of colon (without mention of hemorrhage)  PVD (peripheral vascular disease)  Tachy-brady syndrome  COPD (chronic obstructive pulmonary disease)  Hyperlipidemia  Diastolic congestive heart failure  Chronic kidney disease  Abdominal pain, left upper quadrant    Abdominal pain -Likely secondary to recently diagnosed ischemic colitis. -Patient was seen in consultation by vascular surgery previously,she has hemodynamically significant ostial SMA stenosis. -vascular surgery recommended CT angio of the abdomen pelvis for further recommendation.  Bright red blood per rectum -It can be related to her ischemic colitis, can be also related to anorectal condition. -Patient saw the blood after she wiped after a bowel movement which is typical for anorectal conditions. -Follow the CBC closely, check CBC in the morning.  CHF, diastolic -Patient is on multiple medications including Coreg and digoxin. -currently she has +2 pedal edema, I did not start IV fluids and bowel check her renal function closely..  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 -Vascular surgery to evaluate.  History of tachybradycardia syndrome -has permanent pacemaker.  CKD stage III -Creatinine baseline is 1.2, patient at baseline now. -Patient to have CT angio of abdomen pelvis, creatinine expected to increase after iodine-based contrast dye.  Code Status: full Family Communication: granddaughter at bedside Disposition Plan: inpatient  Time spent: 70 minutes  [facility] Pager [phone]  If 7PM-7AM, please contact night-coverage [email or web address] Password [facility] 02/04/2012, 5:55 PM

## 2012-02-04 NOTE — ED Notes (Signed)
Night meds ordered from pharmacy

## 2012-02-05 DIAGNOSIS — R109 Unspecified abdominal pain: Secondary | ICD-10-CM

## 2012-02-05 DIAGNOSIS — R1084 Generalized abdominal pain: Secondary | ICD-10-CM

## 2012-02-05 DIAGNOSIS — I5032 Chronic diastolic (congestive) heart failure: Secondary | ICD-10-CM

## 2012-02-05 LAB — CBC
Hemoglobin: 11 g/dL — ABNORMAL LOW (ref 12.0–15.0)
MCH: 31.6 pg (ref 26.0–34.0)
MCV: 93.4 fL (ref 78.0–100.0)
RBC: 3.48 MIL/uL — ABNORMAL LOW (ref 3.87–5.11)

## 2012-02-05 MED ORDER — AMLODIPINE BESYLATE 10 MG PO TABS
10.0000 mg | ORAL_TABLET | Freq: Every day | ORAL | Status: DC
  Administered 2012-02-05 – 2012-02-07 (×3): 10 mg via ORAL
  Filled 2012-02-05 (×3): qty 1

## 2012-02-05 MED ORDER — LABETALOL HCL 5 MG/ML IV SOLN: 10.0000 mg | INTRAVENOUS | Status: DC | PRN

## 2012-02-05 MED ORDER — BOOST / RESOURCE BREEZE PO LIQD
1.0000 | Freq: Two times a day (BID) | ORAL | Status: DC
  Administered 2012-02-05 – 2012-02-07 (×6): 1 via ORAL

## 2012-02-05 MED ORDER — CARVEDILOL 6.25 MG PO TABS
6.2500 mg | ORAL_TABLET | Freq: Two times a day (BID) | ORAL | Status: DC
  Administered 2012-02-05 – 2012-02-07 (×4): 6.25 mg via ORAL
  Filled 2012-02-05 (×6): qty 1

## 2012-02-05 MED ORDER — POLYETHYLENE GLYCOL 3350 17 G PO PACK
17.0000 g | PACK | Freq: Every day | ORAL | Status: DC
  Administered 2012-02-05 – 2012-02-07 (×3): 17 g via ORAL
  Filled 2012-02-05 (×3): qty 1

## 2012-02-05 MED ORDER — LABETALOL HCL 5 MG/ML IV SOLN
10.0000 mg | INTRAVENOUS | Status: DC | PRN
Start: 2012-02-05 — End: 2012-02-07
  Administered 2012-02-05 (×2): 10 mg via INTRAVENOUS
  Filled 2012-02-05 (×3): qty 4

## 2012-02-05 NOTE — Progress Notes (Signed)
INITIAL ADULT NUTRITION ASSESSMENT Date: 02/05/2012   Time: 9:15 AM  INTERVENTION:  Resource Breeze supplement 2 times daily between meals (250 kcals, 9 gm protein per 8 fl oz bottle) RD to follow for nutrition care plan  Reason for Assessment: Malnutrition Screening Tool Report  ASSESSMENT: Female 78 y.o.  Dx: Bright red blood per rectum  Hx:  Past Medical History  Diagnosis Date  . Coronary artery disease     acute MI 2003, nonstemi 2008  . MI (myocardial infarction) 2003  . PVD (peripheral vascular disease)   . History of atrial flutter 2009    ablation  . Tachy-brady syndrome   . Paroxysmal atrial fibrillation   . COPD (chronic obstructive pulmonary disease)   . Hyperlipidemia   . Diabetes mellitus   . Diastolic congestive heart failure   . Arthritis   . Chronic kidney disease   . Carotid artery disease   . SSS (sick sinus syndrome)     ablation 2009  . Pneumonia   . Anxiety     adjustment disorder    Related Meds:     . sodium chloride   Intravenous STAT  . aspirin EC  81 mg Oral Daily  . atorvastatin  40 mg Oral Daily  . carvedilol  6.25 mg Oral BID WC  . cloNIDine  0.2 mg Oral BID  . digoxin  125 mcg Oral Daily  . docusate sodium  100 mg Oral BID  . iohexol  20 mL Oral Q1 Hr x 2  . labetalol  10 mg Intravenous Once  . labetalol  10 mg Intravenous Once  . mirtazapine  30 mg Oral QHS  . multivitamin with minerals  1 tablet Oral Daily  . sodium chloride  3 mL Intravenous Q12H  . DISCONTD: aspirin  81 mg Oral Daily    Ht: 5\' 5"  (165.1 cm)  Wt: 147 lb 7.8 oz (66.9 kg)  Ideal Wt: 56.8 kg % Ideal Wt: 117%  Usual Wt: 71 kg -- November 2012 % Usual Wt: 94%  Body mass index is 24.54 kg/(m^2).  Food/Nutrition Related Hx: recent weight lost without trying and decreased appetite per admission nutrition screen  Labs:  CMP     Component Value Date/Time   NA 141 02/04/2012 1414   NA 140 11/02/2011 1611   K 4.5 02/04/2012 1414   CL 102 02/04/2012 1414    CO2 28 02/04/2012 1414   GLUCOSE 92 02/04/2012 1414   GLUCOSE 104* 11/02/2011 1611   BUN 17 02/04/2012 1414   BUN 20 11/02/2011 1611   CREATININE 1.21* 02/04/2012 1414   CALCIUM 9.1 02/04/2012 1414   PROT 7.2 01/23/2012 0118   PROT 6.6 11/02/2011 1611   ALBUMIN 3.9 01/23/2012 0118   AST 24 01/23/2012 0118   ALT 10 01/23/2012 0118   ALKPHOS 103 01/23/2012 0118   BILITOT 0.3 01/23/2012 0118   GFRNONAA 40* 02/04/2012 1414   GFRAA 46* 02/04/2012 1414     Intake/Output Summary (Last 24 hours) at 02/05/12 0916 Last data filed at 02/05/12 0051  Gross per 24 hour  Intake    120 ml  Output      0 ml  Net    120 ml    Diet Order: Clear Liquid  Supplements/Tube Feeding: N/A  IVF: N/A  Estimated Nutritional Needs:   Kcal: 1600-1800 Protein: 80-90 gm Fluid: 1.6-1.8 L  Patient admitted due to left sided abdominal pain and blood per rectum; reports her appetite isn't very good; doesn't now know  if she's lost weight or not; per flowsheet records, patient has had some progressive weight loss over the past year, however, not significant for time frame; no skin breakdown noted; would benefit from addition of clear liquid supplement -- patient amenable -- RD to order.  NUTRITION DIAGNOSIS: -Inadequate oral intake (NI-2.1).  Status: Ongoing  RELATED TO: poor appetite  AS EVIDENCE BY: patient report  MONITORING/EVALUATION(Goals): Goal: Oral intake with meals & supplements to meet >/= 90% of estimated nutrition needs Monitor: PO & supplemental intake, weight, labs, I/O's  EDUCATION NEEDS: -No education needs identified at this time  Dietitian #: [phone]  DOCUMENTATION CODES Per approved criteria  -Not Applicable    Alger Memos 02/05/2012, 9:15 AM

## 2012-02-05 NOTE — Progress Notes (Signed)
[facility] PROGRESS NOTE  Virginia MARCOUX Rice:[id number] DOB: 1923/11/14 DOA: 02/04/2012 PCP: Ruthe Mannan, MD  Assessment/Plan: Principal Problem:  *Bright red blood per rectum Active Problems:  Diverticulosis of colon (without mention of hemorrhage)  PVD (peripheral vascular disease)  Tachy-brady syndrome  COPD (chronic obstructive pulmonary disease)  Hyperlipidemia  Diastolic congestive heart failure  Chronic kidney disease  Abdominal pain, left upper quadrant   Abdominal pain  -Likely secondary to recently diagnosed ischemic colitis.  -Patient was seen in consultation by vascular surgery previously,she has hemodynamically significant ostial SMA stenosis.  -CT angio of the abdomen and pelvis did not show any hemodynamically significant stenosis. -Unclear etiology of abdominal pain, but is improving, I will advance her diet.  Bright red blood per rectum  -It can be related to her ischemic colitis, can be also related to anorectal condition.  -Patient saw the blood after she wiped after a bowel movement which is typical for anorectal conditions.  -Hemoglobin is stable overnight, folate hemoglobin closely   CHF, diastolic  -Patient is on multiple medications including Coreg and digoxin.  -currently she has +2 pedal edema, I did not start IV fluids and bowel check her renal function closely.  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  -Vascular surgery recommended supportive management as there is no surgery planned.  History of tachybradycardia syndrome  -has permanent pacemaker.   CKD stage III  -Creatinine baseline is 1.2, patient at baseline now.  -Patient to have CT angio of abdomen pelvis, creatinine expected to increase after iodine-based contrast dye.   Code Status: Full Family Communication:  Disposition Plan: Remains inpatient   Brief  narrative: [name] is a 78 y.o. female with past medical history of coronary artery disease, chronic CHF and peripheral vascular disease. Patient came in to the hospital because of left sided abdominal pain. Patient said she was in her usual state of health till this morning when she developed left upper quadrant abdominal pain, she rated the pain as 5/10, sharp, does not radiate to any area and she did not remember anything increases or decreases the pain. Patient had similar episode recently when she was admitted to the hospital because of that and she was diagnosed with mesenteric ischemia. Upon initial evaluation in the emergency department patient also mentioned that she have bright red blood per rectum, she noticed that when she was wiping after bowel movement today. She did not see a lot of blood in the pelvic bowl. Vascular surgery evaluated the patient last time, a consultation was done also this time.   Consultants:  Vascular surgery, they signed off  Procedures:  None  Antibiotics:  None  HPI/Subjective: Feels much better, hungry and wants to eat.  Objective: Filed Vitals:   02/05/12 0339 02/05/12 0420 02/05/12 0544 02/05/12 0634  BP: 172/71 166/46 182/52 165/52  Pulse: 64 62  65  Temp:    98 F (36.7 C)  TempSrc:    Oral  Resp:    18  Height:      Weight:      SpO2:    100%    Intake/Output Summary (Last 24 hours) at 02/05/12 1319 Last data filed at 02/05/12 0051  Gross per 24 hour  Intake    120 ml  Output      0 ml  Net    120 ml   Filed Weights   02/04/12 2344  Weight: 66.9 kg (147 lb 7.8 oz)    Exam:  General: Alert and awake, oriented x3, not in any acute distress. HEENT: anicteric sclera, pupils reactive to light and accommodation, EOMI CVS: S1-S2 clear, no murmur rubs or gallops Chest: clear to auscultation bilaterally, no wheezing, rales or rhonchi Abdomen: soft nontender, nondistended, normal bowel sounds, no organomegaly Extremities:  no cyanosis, clubbing or edema noted bilaterally Neuro: Cranial nerves II-XII intact, no focal neurological deficits  Data Reviewed: Basic Metabolic Panel:  Lab 02/04/12 8469  NA 141  K 4.5  CL 102  CO2 28  GLUCOSE 92  BUN 17  CREATININE 1.21*  CALCIUM 9.1  MG --  PHOS --   Liver Function Tests: No results found for this basename: AST:5,ALT:5,ALKPHOS:5,BILITOT:5,PROT:5,ALBUMIN:5 in the last 168 hours No results found for this basename: LIPASE:5,AMYLASE:5 in the last 168 hours No results found for this basename: AMMONIA:5 in the last 168 hours CBC:  Lab 02/05/12 0800 02/04/12 1414  WBC 9.3 10.2  NEUTROABS -- 7.9*  HGB 11.0* 11.5*  HCT 32.5* 34.4*  MCV 93.4 95.3  PLT 309 304   Cardiac Enzymes:  Lab 02/04/12 1627  CKTOTAL --  CKMB --  CKMBINDEX --  TROPONINI <0.30   BNP (last 3 results)  Basename 05/31/11 1242  PROBNP 542.0*   CBG: No results found for this basename: GLUCAP:5 in the last 168 hours  No results found for this or any previous visit (from the past 240 hour(s)).   Studies: Dg Chest 2 View  02/04/2012  *RADIOLOGY REPORT*  Clinical Data: Cough.  Rhonchi on physical examination.  CHEST - 2 VIEW  Comparison: Portable chest x-ray 01/23/2012, 09/21/2010, 03/01/2010.  Two-view chest x-ray 12/04/2009.  Findings: Cardiac silhouette mildly enlarged but stable.  The subclavian dual lead transvenous pacemaker unchanged appears intact.  Mitral annular calcification.  Thoracic aorta mildly tortuous atherosclerotic, unchanged.  Hilar and mediastinal contours otherwise unremarkable.  Lungs hyperinflated but clear. No visible pleural effusions.  Degenerative changes involving the thoracic spine.  Degenerative changes in both shoulder joints, right greater than left.  IMPRESSION: Stable mild cardiomegaly.  Hyperinflation consistent with COPD and/or asthma.  No acute cardiopulmonary disease.   Original Report Authenticated By: Arnell Sieving, M.D.    Ct Angio Abd/pel  W/ And/or W/o  02/04/2012  *RADIOLOGY REPORT*  Clinical Data: Left abdominal pain.  Coronary artery disease. Mesenteric ischemia.  CT ANGIOGRAPHY OF THE ABDOMEN PELVIS WITH CONTRAST  Technique:  Pre and postcontrast CT angiogram images were obtained with multiplanar reconstruction.  Contrast:  100 ml Omnipaque 350  Comparison: 01/23/2012  Findings: Dense calcification of the mitral valve noted with some calcification in the aortic valve.  Pacer leads noted.  Mild right atrial enlargement is present.  There is apical thinning in the left ventricle, potentially related to myocardial infarction.  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 Small focal chronic outpouching of the aorta at the hiatus is observed on image 34 of series 6, similar to priors.  Mild to moderately stenotic origin of the celiac trunk is observed, with calcification involving portions of the common hepatic artery and proper static artery, and atherosclerotic calcification in the splenic artery.  Superior mesenteric artery is patent although significantly narrowed at its ostium due to plaque and intimal thickening.  Just below the ostium of the patent inferior mesenteric artery, a small focal dissection is present and has a chronic appearance.  A stent is present proximally in the right common iliac artery. There is  a large calcified plaque posteriorly in the distal abdominal aorta just above the level of the bifurcation.  This calcified plaque is chronic.  The common femoral artery is patent although there is significant atheromatous narrowing and peripheral calcification in the right superficial femoral artery.  There are two left renal arteries both of which appear patent. Single right renal artery appears patent although there is a large calcified plaque extending into the aortic lumen just proximal to the right renal artery origin.  This is chronic.  Small right and trace left pleural effusions noted mild intrahepatic  biliary dilatation is observed.  The gallbladder is surgically absent.  A 0.9 x 0.6 cm hypodense lesion is observed in segment 4B of the liver.  This is similar to the prior exam.  Spleen unremarkable. Fullness of the adrenal glands noted without overt mass.  Levoconvex lumbar scoliosis noted.  Aside from persistent fetal lobulation and mild scarring of the right mid kidney, no specific renal abnormality is observed.  No pneumatosis or portal venous gas.  Portal vein, hepatic veins, and splenic vein appear patent.  Orally administered contrast extends through to the colon.  Sigmoid diverticulosis noted without active diverticulitis.  Low- level presacral edema is nonspecific.  Lumbar spondylosis and degenerative loss of intervertebral disc height noted at all levels in the lumbar spine.  IMPRESSION:  1.  Atherosclerotic narrowing at the origins of the celiac trunk and SMA, with both of these vessels are patent.  The IMA is patent. 2.  Mild bowel wall thickening in the descending colon, query colitis.  No pneumatosis. 3.  Small right and trace left pleural effusions. This is similar in distribution but significantly less striking compared to the prior exam from 01/23/2012. 4.  Apical thinning in the left ventricle, query prior myocardial infarction. 5.  Extensive atherosclerosis with a focal chronic dissection in the vicinity of the lower descending thoracic aorta, and large chronic calcific plaques extending into the aortic lumen at the level of the right renal artery and just above the bifurcation. The appearance of these lesions is similar to prior exam from 2011. 6.  Stent in the right common iliac artery. 7.  Stenosis in the proximal right superficial femoral artery due to atherosclerosis. 8.  Sigmoid diverticulosis. 9.  Lumbar spondylosis, scoliosis, and degenerative disc disease. 10.  Dense calcification of the mitral valve.   Original Report Authenticated By: Dellia Cloud, M.D.     Scheduled Meds:     . sodium chloride   Intravenous STAT  . aspirin EC  81 mg Oral Daily  . atorvastatin  40 mg Oral Daily  . carvedilol  6.25 mg Oral BID WC  . cloNIDine  0.2 mg Oral BID  . digoxin  125 mcg Oral Daily  . docusate sodium  100 mg Oral BID  . feeding supplement  1 Container Oral BID BM  . iohexol  20 mL Oral Q1 Hr x 2  . labetalol  10 mg Intravenous Once  . labetalol  10 mg Intravenous Once  . mirtazapine  30 mg Oral QHS  . multivitamin with minerals  1 tablet Oral Daily  . sodium chloride  3 mL Intravenous Q12H  . DISCONTD: aspirin  81 mg Oral Daily   Continuous Infusions:   Principal Problem:  *Bright red blood per rectum Active Problems:  Diverticulosis of colon (without mention of hemorrhage)  PVD (peripheral vascular disease)  Tachy-brady syndrome  COPD (chronic obstructive pulmonary disease)  Hyperlipidemia  Diastolic congestive heart failure  Chronic kidney disease  Abdominal pain, left upper quadrant    Time spent: 35 minutes    [facility]  [facility] Pager [phone]. If 8PM-8AM, please contact night-coverage at [email or web address], password [facility] 02/05/2012, 1:19 PM  LOS: 1 day

## 2012-02-05 NOTE — Progress Notes (Signed)
Pt given IV lebatalol at 03:40 per MD order for BP 172/71. BP rechecked at 166/46. MD aware of high blood pressures. Will continue to monitor and assess and consider lebatolol dose 2 hours from dose at 03:40 per MD order and BP parameters. Baron Hamper RN 4:47 AM 02/05/2012

## 2012-02-05 NOTE — Progress Notes (Signed)
When pt arrived to floor BP 165/115 at 23:44. Pharmacy notified to send dose of PRN hydralazine.  Pt BP 187/51 at 01:29. Pt given 10mg  of PRN IV hydralazine at 01:32. BP 198/65 at 02:06. MD notified. Will continue to monitor and assess. Jobe Igo A RN, 02/05/2012

## 2012-02-05 NOTE — Progress Notes (Signed)
PT was brought up from the ED. Pt in no apparent distress. Pt was oriented to room. Pt's vital signs were taken bp 165/115, hr-60, t-97.2, r-20. RN will give pt PRN hydralazine dose and continue to monitor bp.

## 2012-02-05 NOTE — Progress Notes (Signed)
BP 182/52 manual, pt lying down. Notified pharmacy x2 for PRN IV labetalol dose. Waiting on dose. Baron Hamper RN 5:46 AM 02/05/2012

## 2012-02-06 DIAGNOSIS — I779 Disorder of arteries and arterioles, unspecified: Secondary | ICD-10-CM

## 2012-02-06 DIAGNOSIS — Z950 Presence of cardiac pacemaker: Secondary | ICD-10-CM

## 2012-02-06 LAB — BASIC METABOLIC PANEL
CO2: 27 mEq/L (ref 19–32)
Calcium: 8.9 mg/dL (ref 8.4–10.5)
Chloride: 103 mEq/L (ref 96–112)
Creatinine, Ser: 1.26 mg/dL — ABNORMAL HIGH (ref 0.50–1.10)
Glucose, Bld: 110 mg/dL — ABNORMAL HIGH (ref 70–99)
Sodium: 142 mEq/L (ref 135–145)

## 2012-02-06 LAB — CBC
HCT: 34.1 % — ABNORMAL LOW (ref 36.0–46.0)
MCH: 31.6 pg (ref 26.0–34.0)
MCV: 94.5 fL (ref 78.0–100.0)
Platelets: 313 10*3/uL (ref 150–400)
RBC: 3.61 MIL/uL — ABNORMAL LOW (ref 3.87–5.11)

## 2012-02-06 MED ORDER — HYDRALAZINE HCL 50 MG PO TABS
50.0000 mg | ORAL_TABLET | Freq: Three times a day (TID) | ORAL | Status: DC
  Administered 2012-02-06 – 2012-02-07 (×4): 50 mg via ORAL
  Filled 2012-02-06 (×6): qty 1

## 2012-02-06 MED ORDER — CLONIDINE HCL 0.2 MG PO TABS
0.2000 mg | ORAL_TABLET | Freq: Three times a day (TID) | ORAL | Status: DC
  Administered 2012-02-06 – 2012-02-07 (×3): 0.2 mg via ORAL
  Filled 2012-02-06 (×5): qty 1

## 2012-02-06 NOTE — Plan of Care (Signed)
Problem: Phase II Progression Outcomes Goal: Progress activity as tolerated unless otherwise ordered Outcome: Completed/Met Date Met:  02/06/12 OOB to chair

## 2012-02-06 NOTE — Progress Notes (Signed)
[facility] PROGRESS NOTE  [name]:[id number] DOB: 1923-07-13 DOA: 02/04/2012 PCP: Ruthe Mannan, MD  Assessment/Plan: Principal Problem:  *Bright red blood per rectum Active Problems:  Diverticulosis of colon (without mention of hemorrhage)  PVD (peripheral vascular disease)  Tachy-brady syndrome  COPD (chronic obstructive pulmonary disease)  Hyperlipidemia  Diastolic congestive heart failure  Chronic kidney disease  Abdominal pain, left upper quadrant   Abdominal pain  -Likely secondary to recently diagnosed ischemic colitis.  -Patient was seen in consultation by vascular surgery previously,she has hemodynamically significant ostial SMA stenosis.  -CT angio of the abdomen and pelvis did not show any hemodynamically significant stenosis. -Unclear etiology of abdominal pain, but is improving, I will advance her diet.  Bright red blood per rectum  -It can be related to her ischemic colitis, can be also related to anorectal condition.  -Patient saw the blood after she wiped after a bowel movement which is typical for anorectal conditions.  -Hemoglobin is stable overnight, we will follow her hemoglobin closely.  Accelerated hypertension -Her blood pressure is in the high side, her home medications were continued. -She is on clonidine and Coreg. Amlodipine at a 10 mg daily. I will increase her clonidine and add hydralazine.  CHF, diastolic  -Patient is on multiple medications including Coreg and digoxin.  -currently she has +2 pedal edema, I did not start IV fluids and bowel check her renal function closely.  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  -Vascular surgery recommended supportive management as there is no surgery planned.  History of tachybradycardia syndrome  -has permanent pacemaker.   CKD stage III  -Creatinine baseline is 1.2,  patient at baseline now.  -Patient to have CT angio of abdomen pelvis, creatinine expected to increase after iodine-based contrast dye.   Code Status: Full Family Communication:  Disposition Plan: Remains inpatient   Brief narrative: [name] is a 78 y.o. female with past medical history of coronary artery disease, chronic CHF and peripheral vascular disease. Patient came in to the hospital because of left sided abdominal pain. Patient said she was in her usual state of health till this morning when she developed left upper quadrant abdominal pain, she rated the pain as 5/10, sharp, does not radiate to any area and she did not remember anything increases or decreases the pain. Patient had similar episode recently when she was admitted to the hospital because of that and she was diagnosed with mesenteric ischemia. Upon initial evaluation in the emergency department patient also mentioned that she have bright red blood per rectum, she noticed that when she was wiping after bowel movement today. She did not see a lot of blood in the pelvic bowl. Vascular surgery evaluated the patient last time, a consultation was done also this time.   Consultants:  Vascular surgery, they signed off  Procedures:  None  Antibiotics:  None  HPI/Subjective: Feels much better, hungry and wants to eat.  Objective: Filed Vitals:   02/05/12 0634 02/05/12 1354 02/05/12 2053 02/06/12 0658  BP: 165/52 170/50 154/64 180/66  Pulse: 65 60 62 60  Temp: 98 F (36.7 C) 97.6 F (36.4 C) 97.9 F (36.6 C) 97.5 F (36.4 C)  TempSrc: Oral Oral Oral Oral  Resp: 18 18 20 18   Height:      Weight:    69.3 kg (152 lb 12.5 oz)  SpO2: 100% 100% 93% 96%  Intake/Output Summary (Last 24 hours) at 02/06/12 1126 Last data filed at 02/05/12 2146  Gross per 24 hour  Intake    243 ml  Output      0 ml  Net    243 ml   Filed Weights   02/04/12 2344 02/06/12 0658  Weight: 66.9 kg (147 lb 7.8 oz) 69.3 kg (152  lb 12.5 oz)    Exam:  General: Alert and awake, oriented x3, not in any acute distress. HEENT: anicteric sclera, pupils reactive to light and accommodation, EOMI CVS: S1-S2 clear, no murmur rubs or gallops Chest: clear to auscultation bilaterally, no wheezing, rales or rhonchi Abdomen: soft nontender, nondistended, normal bowel sounds, no organomegaly Extremities: no cyanosis, clubbing or edema noted bilaterally Neuro: Cranial nerves II-XII intact, no focal neurological deficits  Data Reviewed: Basic Metabolic Panel:  Lab 02/06/12 1610 02/04/12 1414  NA 142 141  K 4.0 4.5  CL 103 102  CO2 27 28  GLUCOSE 110* 92  BUN 15 17  CREATININE 1.26* 1.21*  CALCIUM 8.9 9.1  MG -- --  PHOS -- --   Liver Function Tests: No results found for this basename: AST:5,ALT:5,ALKPHOS:5,BILITOT:5,PROT:5,ALBUMIN:5 in the last 168 hours No results found for this basename: LIPASE:5,AMYLASE:5 in the last 168 hours No results found for this basename: AMMONIA:5 in the last 168 hours CBC:  Lab 02/06/12 0750 02/05/12 0800 02/04/12 1414  WBC 9.1 9.3 10.2  NEUTROABS -- -- 7.9*  HGB 11.4* 11.0* 11.5*  HCT 34.1* 32.5* 34.4*  MCV 94.5 93.4 95.3  PLT 313 309 304   Cardiac Enzymes:  Lab 02/04/12 1627  CKTOTAL --  CKMB --  CKMBINDEX --  TROPONINI <0.30   BNP (last 3 results)  Basename 05/31/11 1242  PROBNP 542.0*   CBG: No results found for this basename: GLUCAP:5 in the last 168 hours  No results found for this or any previous visit (from the past 240 hour(s)).   Studies: Dg Chest 2 View  02/04/2012  *RADIOLOGY REPORT*  Clinical Data: Cough.  Rhonchi on physical examination.  CHEST - 2 VIEW  Comparison: Portable chest x-ray 01/23/2012, 09/21/2010, 03/01/2010.  Two-view chest x-ray 12/04/2009.  Findings: Cardiac silhouette mildly enlarged but stable.  The subclavian dual lead transvenous pacemaker unchanged appears intact.  Mitral annular calcification.  Thoracic aorta mildly tortuous  atherosclerotic, unchanged.  Hilar and mediastinal contours otherwise unremarkable.  Lungs hyperinflated but clear. No visible pleural effusions.  Degenerative changes involving the thoracic spine.  Degenerative changes in both shoulder joints, right greater than left.  IMPRESSION: Stable mild cardiomegaly.  Hyperinflation consistent with COPD and/or asthma.  No acute cardiopulmonary disease.   Original Report Authenticated By: Arnell Sieving, M.D.    Ct Angio Abd/pel W/ And/or W/o  02/04/2012  *RADIOLOGY REPORT*  Clinical Data: Left abdominal pain.  Coronary artery disease. Mesenteric ischemia.  CT ANGIOGRAPHY OF THE ABDOMEN PELVIS WITH CONTRAST  Technique:  Pre and postcontrast CT angiogram images were obtained with multiplanar reconstruction.  Contrast:  100 ml Omnipaque 350  Comparison: 01/23/2012  Findings: Dense calcification of the mitral valve noted with some calcification in the aortic valve.  Pacer leads noted.  Mild right atrial enlargement is present.  There is apical thinning in the left ventricle, potentially related to myocardial infarction.  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 Small focal chronic outpouching of the aorta at the hiatus is observed on image 34 of series 6, similar to priors.  Mild to moderately stenotic origin of  the celiac trunk is observed, with calcification involving portions of the common hepatic artery and proper static artery, and atherosclerotic calcification in the splenic artery.  Superior mesenteric artery is patent although significantly narrowed at its ostium due to plaque and intimal thickening.  Just below the ostium of the patent inferior mesenteric artery, a small focal dissection is present and has a chronic appearance.  A stent is present proximally in the right common iliac artery. There is a large calcified plaque posteriorly in the distal abdominal aorta just above the level of the bifurcation.  This calcified plaque is chronic.   The common femoral artery is patent although there is significant atheromatous narrowing and peripheral calcification in the right superficial femoral artery.  There are two left renal arteries both of which appear patent. Single right renal artery appears patent although there is a large calcified plaque extending into the aortic lumen just proximal to the right renal artery origin.  This is chronic.  Small right and trace left pleural effusions noted mild intrahepatic biliary dilatation is observed.  The gallbladder is surgically absent.  A 0.9 x 0.6 cm hypodense lesion is observed in segment 4B of the liver.  This is similar to the prior exam.  Spleen unremarkable. Fullness of the adrenal glands noted without overt mass.  Levoconvex lumbar scoliosis noted.  Aside from persistent fetal lobulation and mild scarring of the right mid kidney, no specific renal abnormality is observed.  No pneumatosis or portal venous gas.  Portal vein, hepatic veins, and splenic vein appear patent.  Orally administered contrast extends through to the colon.  Sigmoid diverticulosis noted without active diverticulitis.  Low- level presacral edema is nonspecific.  Lumbar spondylosis and degenerative loss of intervertebral disc height noted at all levels in the lumbar spine.  IMPRESSION:  1.  Atherosclerotic narrowing at the origins of the celiac trunk and SMA, with both of these vessels are patent.  The IMA is patent. 2.  Mild bowel wall thickening in the descending colon, query colitis.  No pneumatosis. 3.  Small right and trace left pleural effusions. This is similar in distribution but significantly less striking compared to the prior exam from 01/23/2012. 4.  Apical thinning in the left ventricle, query prior myocardial infarction. 5.  Extensive atherosclerosis with a focal chronic dissection in the vicinity of the lower descending thoracic aorta, and large chronic calcific plaques extending into the aortic lumen at the level of the  right renal artery and just above the bifurcation. The appearance of these lesions is similar to prior exam from 2011. 6.  Stent in the right common iliac artery. 7.  Stenosis in the proximal right superficial femoral artery due to atherosclerosis. 8.  Sigmoid diverticulosis. 9.  Lumbar spondylosis, scoliosis, and degenerative disc disease. 10.  Dense calcification of the mitral valve.   Original Report Authenticated By: Dellia Cloud, M.D.     Scheduled Meds:    . amLODipine  10 mg Oral Daily  . aspirin EC  81 mg Oral Daily  . atorvastatin  40 mg Oral Daily  . carvedilol  6.25 mg Oral BID WC  . cloNIDine  0.2 mg Oral BID  . digoxin  125 mcg Oral Daily  . docusate sodium  100 mg Oral BID  . feeding supplement  1 Container Oral BID BM  . mirtazapine  30 mg Oral QHS  . multivitamin with minerals  1 tablet Oral Daily  . polyethylene glycol  17 g Oral Daily  .  sodium chloride  3 mL Intravenous Q12H  . DISCONTD: carvedilol  6.25 mg Oral BID WC   Continuous Infusions:   Principal Problem:  *Bright red blood per rectum Active Problems:  Diverticulosis of colon (without mention of hemorrhage)  PVD (peripheral vascular disease)  Tachy-brady syndrome  COPD (chronic obstructive pulmonary disease)  Hyperlipidemia  Diastolic congestive heart failure  Chronic kidney disease  Abdominal pain, left upper quadrant    Time spent: 35 minutes    [facility]  [facility] Pager [phone]. If 8PM-8AM, please contact night-coverage at [email or web address], password [facility] 02/06/2012, 11:26 AM  LOS: 2 days

## 2012-02-07 DIAGNOSIS — I739 Peripheral vascular disease, unspecified: Secondary | ICD-10-CM

## 2012-02-07 DIAGNOSIS — J9600 Acute respiratory failure, unspecified whether with hypoxia or hypercapnia: Secondary | ICD-10-CM

## 2012-02-07 LAB — CBC
HCT: 30.8 % — ABNORMAL LOW (ref 36.0–46.0)
MCH: 31.3 pg (ref 26.0–34.0)
MCHC: 33.1 g/dL (ref 30.0–36.0)
MCV: 94.5 fL (ref 78.0–100.0)
Platelets: 275 10*3/uL (ref 150–400)
RDW: 15.6 % — ABNORMAL HIGH (ref 11.5–15.5)
WBC: 10 10*3/uL (ref 4.0–10.5)

## 2012-02-07 LAB — BASIC METABOLIC PANEL
BUN: 20 mg/dL (ref 6–23)
CO2: 28 mEq/L (ref 19–32)
Calcium: 8.4 mg/dL (ref 8.4–10.5)
Creatinine, Ser: 1.32 mg/dL — ABNORMAL HIGH (ref 0.50–1.10)
Glucose, Bld: 111 mg/dL — ABNORMAL HIGH (ref 70–99)

## 2012-02-07 MED ORDER — ACETAMINOPHEN 500 MG PO TABS: 500.0000 mg | ORAL_TABLET | Freq: Four times a day (QID) | ORAL | Status: DC | PRN

## 2012-02-07 MED ORDER — INFLUENZA VIRUS VACC SPLIT PF IM SUSP
0.5000 mL | INTRAMUSCULAR | Status: AC | PRN
  Administered 2012-02-07: 0.5 mL via INTRAMUSCULAR
  Filled 2012-02-07: qty 0.5

## 2012-02-07 MED ORDER — CLONIDINE HCL 0.2 MG PO TABS: 0.2000 mg | ORAL_TABLET | Freq: Three times a day (TID) | ORAL | Status: DC

## 2012-02-07 NOTE — ED Provider Notes (Signed)
Medical screening examination/treatment/procedure(s) were conducted as a shared visit with non-physician practitioner(s) and myself.  I personally evaluated the patient during the encounter   Latysha Thackston, MD 02/07/12 1556 

## 2012-02-07 NOTE — Discharge Summary (Signed)
Physician Discharge Summary  [name] WGN:[id number] DOB: 1923/08/29 DOA: 02/04/2012  PCP: Ruthe Mannan, MD  Admit date: 02/04/2012 Discharge date: 02/07/2012  Recommendations for Outpatient Follow-up:  Followup with primary care physician, followup CBC Discharge Diagnoses:  Principal Problem:  *Bright red blood per rectum Active Problems:  Diverticulosis of colon (without mention of hemorrhage)  PVD (peripheral vascular disease)  Tachy-brady syndrome  COPD (chronic obstructive pulmonary disease)  Hyperlipidemia  Diastolic congestive heart failure  Chronic kidney disease  Abdominal pain, left upper quadrant   Discharge Condition: Stable  Diet recommendation: Heart healthy diet  Filed Weights   02/04/12 2344 02/06/12 0658 02/07/12 0627  Weight: 66.9 kg (147 lb 7.8 oz) 69.3 kg (152 lb 12.5 oz) 68.8 kg (151 lb 10.8 oz)    History of present illness:  [name] is a 78 y.o. female with past medical history of coronary artery disease, chronic CHF and peripheral vascular disease. Patient came in to the hospital because of left sided abdominal pain. Patient said she was in her usual state of health till this morning when she developed left upper quadrant abdominal pain, she rated the pain as 5/10, sharp, does not radiate to any area and she did not remember anything increases or decreases the pain. Patient had similar episode recently when she was admitted to the hospital because of that and she was diagnosed with mesenteric ischemia. Upon initial evaluation in the emergency department patient also mentioned that she have bright red blood per rectum, she noticed that when she was wiping after bowel movement today. She did not see a lot of blood in the pelvic bowl. Vascular surgery evaluated the patient last time, a consultation was done also this time.   Hospital Course:    1. Bright red blood per rectum: This is was thought initially to be secondary to her previously  diagnosed ischemic colitis, especially in presence of abdominal pain. But the bleeding did not recur, patient mentioned that she did notice the blood when she is wiping, and there is no much in the toilet bowl. I think from the history is more consistent with anorectal condition likely hemorrhoids or other self-limiting diverticular bleed. Patient had a bowel movement in the hospital without any blood. Because of the bleeding is self limiting patient was observed without any intervention, and patient to followup with her primary care physician. She did have recent colonoscopy, done by Dr. Arlyce Dice on 01/27/2012. However at the time of discharge patient hemoglobin slightly decreased from 11.0 on day of admission to 10.2 on day of discharge, followup as outpatient.  2. Abdominal pain: patient had recurrent left upper quadrant abdominal pain, this is as mentioned above was thought initially to be secondary to her previously diagnosed ischemic colitis. Patient was seen in consultation by Dr. Darrick Penna, he recommended CT angio of abdomen and pelvis, this was done and showed no hemodynamically significant lesions affecting the arteries in the abdomen or pelvis. Dr. Darrick Penna recommended no further intervention or surgical evaluation. Her diet advanced peripherally, patient tolerated diet fully without any problems, and she had bowel movements without any problems.  3. CK stage III: Her creatinine baseline is 1.2, patient had baseline at the time of admission. Patient had a CT angio of abdomen pelvis using IV contrast, because of the iodine based contrast dye her creatinine expected to increase, but she has very minimal increased to 1.3.  4. Diastolic CHF: Chronic diastolic CHF, patient is on multiple medication including Coreg and digoxin, when she  came in she had +1 to +2 pedal edema. No IV fluids were started, no IV diuresis were given patient self diuresed and her edema resolved prior to discharge.  5. Peripheral  vascular disease: Including carotid artery disease. 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 It seems to be stable and none of these is hemodynamically significant.  6. History of tachybradycardia syndrome: Has permanent pacemaker.  7. Hypertension: Difficult to control blood pressure, I think pain was making blood pressure control is difficult. At the time of discharge her clonidine increased to 3 times a day instead of twice a day.  Procedures:  None  Consultations:  Dr. Fabienne Bruns of vascular surgery  Discharge Exam: Filed Vitals:   02/07/12 0627 02/07/12 0801 02/07/12 1037 02/07/12 1336  BP: 108/57 122/61  113/53  Pulse: 60 60 59 59  Temp: 98 F (36.7 C)   97.4 F (36.3 C)  TempSrc: Oral   Oral  Resp: 18 16  19   Height:      Weight: 68.8 kg (151 lb 10.8 oz)     SpO2: 98%   93%   General: Alert and awake, oriented x3, not in any acute distress. HEENT: anicteric sclera, pupils reactive to light and accommodation, EOMI CVS: S1-S2 clear, no murmur rubs or gallops Chest: clear to auscultation bilaterally, no wheezing, rales or rhonchi Abdomen: soft nontender, nondistended, normal bowel sounds, no organomegaly Extremities: no cyanosis, clubbing or edema noted bilaterally Neuro: Cranial nerves II-XII intact, no focal neurological deficits   Discharge Instructions  Discharge Orders    Future Orders Please Complete By Expires   Diet - low sodium heart healthy      Increase activity slowly          Medication List     As of 02/07/2012  3:24 PM    STOP taking these medications         HYDROcodone-acetaminophen 5-325 MG per tablet   Commonly known as: NORCO/VICODIN      meloxicam 7.5 MG tablet   Commonly known as: MOBIC      TAKE these medications         acetaminophen 500 MG tablet   Commonly known as: TYLENOL   Take 1-2 tablets (500-1,000 mg total) by mouth every 6 (six) hours as needed for pain.       aspirin 81 MG tablet   Take 81 mg by mouth daily.      atorvastatin 40 MG tablet   Commonly known as: LIPITOR   Take 1 tablet (40 mg total) by mouth daily.      carvedilol 6.25 MG tablet   Commonly known as: COREG   Take 1 tablet (6.25 mg total) by mouth 2 (two) times daily with a meal.      cloNIDine 0.2 MG tablet   Commonly known as: CATAPRES   Take 1 tablet (0.2 mg total) by mouth 3 (three) times daily.      digoxin 0.125 MG tablet   Commonly known as: LANOXIN   Take 1 tablet (125 mcg total) by mouth daily.      docusate sodium 100 MG capsule   Commonly known as: COLACE   Take 1 capsule (100 mg total) by mouth 2 (two) times daily.      metFORMIN 500 MG (MOD) 24 hr tablet   Commonly known as: GLUMETZA   Take 1 tablet (500 mg total) by mouth 2 (two) times daily with a meal.  mirtazapine 30 MG tablet   Commonly known as: REMERON   Take 1 tablet (30 mg total) by mouth at bedtime.      multivitamin with minerals Tabs   Take 1 tablet by mouth daily.      nitroGLYCERIN 0.4 MG SL tablet   Commonly known as: NITROSTAT   Place 0.4 mg under the tongue every 5 (five) minutes as needed.      ondansetron 4 MG tablet   Commonly known as: ZOFRAN   Take 4 mg by mouth every 8 (eight) hours as needed.           Follow-up Information    Follow up with Ruthe Mannan, MD. In 1 week.   Contact information:   [address] [place] [address] [place] [address] [phone]           The results of significant diagnostics from this hospitalization (including imaging, microbiology, ancillary and laboratory) are listed below for reference.    Significant Diagnostic Studies: Ct Abdomen Pelvis Wo Contrast  01/23/2012  *RADIOLOGY REPORT*  Clinical Data:  Abdominal pain, diarrhea, nausea  CT ABDOMEN AND PELVIS WITHOUT CONTRAST  Technique:  Multidetector CT imaging of the abdomen and pelvis was performed following the standard protocol without intravenous contrast.   Comparison: Abdominal ultrasound performed today, 01/23/2012, most recent prior CT scan of the abdomen and pelvis 02/27/2010  Findings:  Lower Chest:  The pacing lead is identified in the right ventricular apex.  There is caseous calcification of the mitral valve annulus.  The visualized heart is within normal limits for size.  Trace dependent atelectasis.  The lung bases are otherwise clear  Abdomen: Evaluation of the abdominal organs and vasculature is significantly limited in the absence of intravenous contrast material.  Given the underlying limitations, the appearance of the stomach, duodenum, adrenal glands pancreas and spleen are unremarkable.  The spleen is relatively small in size.  A nonspecific 9 mm hypoattenuating lesion in hepatic segment 4B is unchanged from prior and likely reflects a benign hepatic cyst. The gallbladder is surgically absent.  Minimal biliary ductal dilatation, unchanged from prior no hydronephrosis, or nephrolithiasis.  Prominent dromedary hump of the left kidney, unchanged from prior studies dating back to 2011.  Diffuse colonic wall thickening beginning in the region of the splenic flexure and extending down the descending colon to the proximal sigmoid colon. The degree of greatest wall thickening is noted at the splenic flexure and there is surrounding interstitial stranding in the pericolonic fat.  Scattered diverticula are noted the distal descending and sigmoid colon, however there is minimal to no inflammatory stranding around the diverticular to suggest diverticulosis.  Normal-caliber small bowel, no evidence of bowel obstruction.  No ascites, or suspicious adenopathy.  Pelvis: The bladder is distended with urine.  Evaluation the prostate gland limited by streak artifact from bilateral hip prosthesis.  No free fluid, or suspicious adenopathy.  Bones: No acute fracture or aggressive appearing lytic or blastic osseous lesion.  Incompletely imaged bilateral total hip  arthroplasty prosthesis.  Evaluation the prosthesis limited by extensive streak artifact. Multilevel degenerative disc disease and advanced lower lumbar facet arthropathy. Levoconvex scoliosis of the lumbar spine centered at the L4.  Vascular: Significantly limited evaluation the absence of intravenous contrast material.  There is extensive calcified atherosclerotic vascular disease with a large mass-like calcification in the abdominal aorta at the level of the celiac origin which occupies at least 60% of the aortic lumen.  Diffuse calcifications involve the  celiac artery and its branches, and the SMA origin. A metallic stent is noted in the proximal right common iliac artery there is focal ectasia of the distal thoracic aorta which measures up to 3 cm at the aortic hiatus.  IMPRESSION:  1.  Marked colonic wall thickening involving the splenic flexure, descending colon and proximal sigmoid colon.  The process is most prominent at the splenic flexure where there is surrounding inflammatory change.  Given the location of the epicenter of the process at the SMA/IMA water shed, and the background of extensive underlying atherosclerotic vascular disease, ischemic colitis is favored.  Additional differential considerations include infectious, and inflammatory colitis.  2.  Extensive atherosclerotic vascular disease as detailed above. Evaluation is limited in the absence of intravenous contrast.  3. Additional ancillary findings as detailed above without significant interval change.   Original Report Authenticated By: Alvino Blood Chest 2 View  02/04/2012  *RADIOLOGY REPORT*  Clinical Data: Cough.  Rhonchi on physical examination.  CHEST - 2 VIEW  Comparison: Portable chest x-ray 01/23/2012, 09/21/2010, 03/01/2010.  Two-view chest x-ray 12/04/2009.  Findings: Cardiac silhouette mildly enlarged but stable.  The subclavian dual lead transvenous pacemaker unchanged appears intact.  Mitral annular calcification.  Thoracic  aorta mildly tortuous atherosclerotic, unchanged.  Hilar and mediastinal contours otherwise unremarkable.  Lungs hyperinflated but clear. No visible pleural effusions.  Degenerative changes involving the thoracic spine.  Degenerative changes in both shoulder joints, right greater than left.  IMPRESSION: Stable mild cardiomegaly.  Hyperinflation consistent with COPD and/or asthma.  No acute cardiopulmonary disease.   Original Report Authenticated By: Arnell Sieving, M.D.    US Abdomen Complete  01/23/2012  *RADIOLOGY REPORT*  Clinical Data:  Left-sided abdominal pain.  Diarrhea.  COMPLETE ABDOMINAL ULTRASOUND  Comparison:  CT abdomen and pelvis 02/27/2010  Findings:  Gallbladder:  Gallbladder is surgically absent.  Common bile duct:  Normal caliber with measured diameter of 4.5 mm.  Liver:  Normal homogeneous liver parenchymal echotexture.  Small cystic lesions measuring less than 1 cm diameter corresponding to cysts seen on previous CT.  IVC:  Appears normal.  Pancreas:  Pancreatic duct is upper limits of normal diameter, measuring 1.7 mm.  Diffuse pancreatic atrophy.  Stable since previous CT.  Spleen:  Spleen length measures 4.1 cm.  Normal homogeneous parenchymal echotexture.  Right Kidney:  Right kidney measures 8.6 cm length.  Diffuse parenchymal atrophy is suggested.  No hydronephrosis.  Left Kidney:  Left kidney measures 8.6 cm length.  Diffuse parenchymal atrophy.  No hydronephrosis.  Abdominal aorta:  No aneurysm identified.  IMPRESSION: Surgical absence of the gallbladder.  Small cysts in the liver. Prominent pancreatic duct similar to previous CT study.  No acute changes identified.   Original Report Authenticated By: Marlon Pel, M.D.    Dg Chest Port 1 View  01/23/2012  *RADIOLOGY REPORT*  Clinical Data: Left lower quadrant abdominal pain.  PORTABLE CHEST - 1 VIEW  Comparison: 09/21/2010  Findings: Stable appearance of cardiac pacemaker.  Emphysematous changes and scattered fibrosis in  the lungs.  Normal heart size and pulmonary vascularity.  No focal airspace consolidation in the lungs.  No blunting of costophrenic angles.  No pneumothorax. Mediastinal contours appear intact.  Calcification of the aorta. Calcified hilar lymph nodes.  Calcified granulomas in the lungs. Degenerative changes in the spine and shoulders.  Stable appearance since previous study.  IMPRESSION: Emphysematous changes and scattered fibrosis in the lungs.  No evidence of active pulmonary disease.  Stable appearance since previous study.   Original Report Authenticated By: Marlon Pel, M.D.    Ct Angio Abd/pel W/ And/or W/o  02/04/2012  *RADIOLOGY REPORT*  Clinical Data: Left abdominal pain.  Coronary artery disease. Mesenteric ischemia.  CT ANGIOGRAPHY OF THE ABDOMEN PELVIS WITH CONTRAST  Technique:  Pre and postcontrast CT angiogram images were obtained with multiplanar reconstruction.  Contrast:  100 ml Omnipaque 350  Comparison: 01/23/2012  Findings: Dense calcification of the mitral valve noted with some calcification in the aortic valve.  Pacer leads noted.  Mild right atrial enlargement is present.  There is apical thinning in the left ventricle, potentially related to myocardial infarction.  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 Small focal chronic outpouching of the aorta at the hiatus is observed on image 34 of series 6, similar to priors.  Mild to moderately stenotic origin of the celiac trunk is observed, with calcification involving portions of the common hepatic artery and proper static artery, and atherosclerotic calcification in the splenic artery.  Superior mesenteric artery is patent although significantly narrowed at its ostium due to plaque and intimal thickening.  Just below the ostium of the patent inferior mesenteric artery, a small focal dissection is present and has a chronic appearance.  A stent is present proximally in the right common iliac artery. There is a  large calcified plaque posteriorly in the distal abdominal aorta just above the level of the bifurcation.  This calcified plaque is chronic.  The common femoral artery is patent although there is significant atheromatous narrowing and peripheral calcification in the right superficial femoral artery.  There are two left renal arteries both of which appear patent. Single right renal artery appears patent although there is a large calcified plaque extending into the aortic lumen just proximal to the right renal artery origin.  This is chronic.  Small right and trace left pleural effusions noted mild intrahepatic biliary dilatation is observed.  The gallbladder is surgically absent.  A 0.9 x 0.6 cm hypodense lesion is observed in segment 4B of the liver.  This is similar to the prior exam.  Spleen unremarkable. Fullness of the adrenal glands noted without overt mass.  Levoconvex lumbar scoliosis noted.  Aside from persistent fetal lobulation and mild scarring of the right mid kidney, no specific renal abnormality is observed.  No pneumatosis or portal venous gas.  Portal vein, hepatic veins, and splenic vein appear patent.  Orally administered contrast extends through to the colon.  Sigmoid diverticulosis noted without active diverticulitis.  Low- level presacral edema is nonspecific.  Lumbar spondylosis and degenerative loss of intervertebral disc height noted at all levels in the lumbar spine.  IMPRESSION:  1.  Atherosclerotic narrowing at the origins of the celiac trunk and SMA, with both of these vessels are patent.  The IMA is patent. 2.  Mild bowel wall thickening in the descending colon, query colitis.  No pneumatosis. 3.  Small right and trace left pleural effusions. This is similar in distribution but significantly less striking compared to the prior exam from 01/23/2012. 4.  Apical thinning in the left ventricle, query prior myocardial infarction. 5.  Extensive atherosclerosis with a focal chronic dissection  in the vicinity of the lower descending thoracic aorta, and large chronic calcific plaques extending into the aortic lumen at the level of the right renal artery and just above the bifurcation. The appearance of these lesions is similar to prior exam from 2011. 6.  Stent in the right  common iliac artery. 7.  Stenosis in the proximal right superficial femoral artery due to atherosclerosis. 8.  Sigmoid diverticulosis. 9.  Lumbar spondylosis, scoliosis, and degenerative disc disease. 10.  Dense calcification of the mitral valve.   Original Report Authenticated By: Dellia Cloud, M.D.     Microbiology: No results found for this or any previous visit (from the past 240 hour(s)).   Labs: Basic Metabolic Panel:  Lab 02/07/12 1610 02/06/12 0750 02/04/12 1414  NA 140 142 141  K 4.3 4.0 4.5  CL 103 103 102  CO2 28 27 28   GLUCOSE 111* 110* 92  BUN 20 15 17   CREATININE 1.32* 1.26* 1.21*  CALCIUM 8.4 8.9 9.1  MG -- -- --  PHOS -- -- --   Liver Function Tests: No results found for this basename: AST:5,ALT:5,ALKPHOS:5,BILITOT:5,PROT:5,ALBUMIN:5 in the last 168 hours No results found for this basename: LIPASE:5,AMYLASE:5 in the last 168 hours No results found for this basename: AMMONIA:5 in the last 168 hours CBC:  Lab 02/07/12 0654 02/06/12 0750 02/05/12 0800 02/04/12 1414  WBC 10.0 9.1 9.3 10.2  NEUTROABS -- -- -- 7.9*  HGB 10.2* 11.4* 11.0* 11.5*  HCT 30.8* 34.1* 32.5* 34.4*  MCV 94.5 94.5 93.4 95.3  PLT 275 313 309 304   Cardiac Enzymes:  Lab 02/04/12 1627  CKTOTAL --  CKMB --  CKMBINDEX --  TROPONINI <0.30   BNP: BNP (last 3 results)  Basename 05/31/11 1242  PROBNP 542.0*   CBG: No results found for this basename: GLUCAP:5 in the last 168 hours  Time coordinating discharge: 40 minutes  Signed:  Joane Postel A  [facility] 02/07/2012, 3:24 PM

## 2012-02-07 NOTE — Progress Notes (Signed)
Nsg Discharge Note  Admit Date:  02/04/2012 Discharge date: 02/07/2012   Alonna Minium to be D/C'd Home per MD order.  AVS completed.  Copy for chart, and copy for patient signed, and dated. Patient/caregiver able to verbalize understanding.  Discharge Medication:  Bekki, Man  Home Medication Instructions [id number]   Printed on:02/07/12 1706  Medication Information                    nitroGLYCERIN (NITROSTAT) 0.4 MG SL tablet Place 0.4 mg under the tongue every 5 (five) minutes as needed.             atorvastatin (LIPITOR) 40 MG tablet Take 1 tablet (40 mg total) by mouth daily.           metFORMIN (GLUMETZA) 500 MG (MOD) 24 hr tablet Take 1 tablet (500 mg total) by mouth 2 (two) times daily with a meal.           docusate sodium (COLACE) 100 MG capsule Take 1 capsule (100 mg total) by mouth 2 (two) times daily.           mirtazapine (REMERON) 30 MG tablet Take 1 tablet (30 mg total) by mouth at bedtime.           digoxin (LANOXIN) 0.125 MG tablet Take 1 tablet (125 mcg total) by mouth daily.           carvedilol (COREG) 6.25 MG tablet Take 1 tablet (6.25 mg total) by mouth 2 (two) times daily with a meal.           Multiple Vitamin (MULTIVITAMIN WITH MINERALS) TABS Take 1 tablet by mouth daily.           ondansetron (ZOFRAN) 4 MG tablet Take 4 mg by mouth every 8 (eight) hours as needed.           aspirin 81 MG tablet Take 81 mg by mouth daily.           acetaminophen (TYLENOL EX ST ARTHRITIS PAIN) 500 MG tablet Take 1-2 tablets (500-1,000 mg total) by mouth every 6 (six) hours as needed for pain.           cloNIDine (CATAPRES) 0.2 MG tablet Take 1 tablet (0.2 mg total) by mouth 3 (three) times daily.             Discharge Assessment: Filed Vitals:   02/07/12 1336  BP: 113/53  Pulse: 59  Temp: 97.4 F (36.3 C)  Resp: 19   Skin clean, dry and intact without evidence of skin break down, no evidence of skin tears noted. IV catheter discontinued  intact. Site without signs and symptoms of complications - no redness or edema noted at insertion site, patient denies c/o pain - only slight tenderness at site.  Dressing with slight pressure applied.  D/c Instructions-Education: Discharge instructions given to patient/family with verbalized understanding. D/c education completed with patient/family including follow up instructions, medication list, d/c activities limitations if indicated, with other d/c instructions as indicated by MD - patient able to verbalize understanding, all questions fully answered. Patient instructed to return to ED, call 911, or call MD for any changes in condition.  Patient escorted via WC, and D/C home via private auto.  Genea Rheaume Consuella Lose, RN 02/07/2012 5:06 PM

## 2012-02-07 NOTE — ED Provider Notes (Signed)
Medical screening examination/treatment/procedure(s) were conducted as a shared visit with non-physician practitioner(s) and myself.  I personally evaluated the patient during the encounter Patient with recent hospitalization with ischemic colitis and signs of extensive atherosclerosis. Patient returns today after being home from the hospital about one week with him prior leaking per rectum. There are no signs of hemorrhoids externally and from rectal exam was nontender. Unclear where the bleeding is coming from but today patient has diffuse abdominal pain more tenderness in the lower abdomen. Will repeat labs and admit patient for further evaluation  Gwyneth Sprout, MD 02/07/12 1559

## 2012-02-08 ENCOUNTER — Ambulatory Visit: Payer: Medicare Other | Admitting: Family Medicine

## 2012-02-10 ENCOUNTER — Telehealth: Payer: Self-pay

## 2012-02-10 ENCOUNTER — Ambulatory Visit: Payer: Medicare Other | Admitting: Family Medicine

## 2012-02-10 DIAGNOSIS — E441 Mild protein-calorie malnutrition: Secondary | ICD-10-CM

## 2012-02-10 NOTE — Telephone Encounter (Signed)
Letter written

## 2012-02-10 NOTE — Telephone Encounter (Signed)
Pts nephew said pt does not have heat in pts home. [place] [employer] needs letter from PCP that no heat in pt's home is against pt's health. Once letter received [place] will have fuel delivered. Call when letter ready for pick up.

## 2012-02-10 NOTE — Telephone Encounter (Signed)
Advised patient's sister that letter is ready, she will have nephew pick it up.  Letter placed at front desk.

## 2012-02-14 ENCOUNTER — Ambulatory Visit (INDEPENDENT_AMBULATORY_CARE_PROVIDER_SITE_OTHER): Payer: Medicare Other | Admitting: Family Medicine

## 2012-02-14 ENCOUNTER — Encounter: Payer: Self-pay | Admitting: *Deleted

## 2012-02-14 ENCOUNTER — Encounter: Payer: Self-pay | Admitting: Family Medicine

## 2012-02-14 VITALS — BP 180/62 | HR 60 | Temp 97.7°F | Wt 144.0 lb

## 2012-02-14 DIAGNOSIS — R1084 Generalized abdominal pain: Secondary | ICD-10-CM

## 2012-02-14 DIAGNOSIS — N189 Chronic kidney disease, unspecified: Secondary | ICD-10-CM

## 2012-02-14 DIAGNOSIS — K625 Hemorrhage of anus and rectum: Secondary | ICD-10-CM

## 2012-02-14 DIAGNOSIS — I4891 Unspecified atrial fibrillation: Secondary | ICD-10-CM

## 2012-02-14 DIAGNOSIS — I10 Essential (primary) hypertension: Secondary | ICD-10-CM

## 2012-02-14 DIAGNOSIS — R1012 Left upper quadrant pain: Secondary | ICD-10-CM

## 2012-02-14 LAB — CBC WITH DIFFERENTIAL/PLATELET
Basophils Absolute: 0.1 10*3/uL (ref 0.0–0.1)
Eosinophils Absolute: 0.3 10*3/uL (ref 0.0–0.7)
Eosinophils Relative: 3.1 % (ref 0.0–5.0)
HCT: 33.5 % — ABNORMAL LOW (ref 36.0–46.0)
Lymphs Abs: 1.9 10*3/uL (ref 0.7–4.0)
MCV: 101.1 fl — ABNORMAL HIGH (ref 78.0–100.0)
Monocytes Absolute: 0.9 10*3/uL (ref 0.1–1.0)
Neutrophils Relative %: 62.2 % (ref 43.0–77.0)
Platelets: 353 10*3/uL (ref 150.0–400.0)
RDW: 15.7 % — ABNORMAL HIGH (ref 11.5–14.6)
WBC: 8.5 10*3/uL (ref 4.5–10.5)

## 2012-02-14 LAB — BASIC METABOLIC PANEL
Calcium: 8.5 mg/dL (ref 8.4–10.5)
GFR: 57.53 mL/min — ABNORMAL LOW (ref >60.00)
Glucose, Bld: 95 mg/dL (ref 70–99)
Potassium: 4.2 mEq/L (ref 3.5–5.1)
Sodium: 142 mEq/L (ref 135–145)

## 2012-02-14 MED ORDER — HYDRALAZINE HCL 25 MG PO TABS: 25.0000 mg | ORAL_TABLET | Freq: Three times a day (TID) | ORAL | Status: DC

## 2012-02-14 NOTE — Patient Instructions (Addendum)
Good to see you. We are adding hydralazine 25 mg three times daily to your clonidine. Let's make an appointment with your heart doctor. Please stop by to see [name] on your way out. We are also checking your blood work today and will call you with those results.

## 2012-02-14 NOTE — Progress Notes (Signed)
[name] is a very pleasant 78 yo female here for hospital follow up.   Admitted to [facility] on 9/8.   Notes reviewed.  Went to ED on 9/8 for acute onset of sharp, intermittent abdominal pain.    GI consulted with the following work up-  1.  arteriogram showed all 3 mesenteric vessels patent, with extensive calcifications and moderate stenosis in the SMA.  2.   Colonoscopy without abnormality other than diverticulosis.   It was felt that pain was likely related to mild mesenteric ischemia.   Unfortunately readmitted on 02/04/2012 and discharged on 02/07/2012(notes again reviewed) for bright red blood per rectum- felt this was not due to her ischemic colitis. It was self resolving.  Hemoglobin did drop to 10.2 on day of discharge.   Lab Results  Component Value Date   WBC 10.0 02/07/2012   HGB 10.2* 02/07/2012   HCT 30.8* 02/07/2012   MCV 94.5 02/07/2012   PLT 275 02/07/2012   Lab Results  Component Value Date   CREATININE 1.32* 02/07/2012   She also did have some LUQ recurrent pain.  Dr. Darrick Penna was consulted due to her h/o ischemic colitis- CT angio of abdomen and pelvis showed no hemodynamically significant lesions and he suggested no further work up. She has advanced her diet without issue, abdominal pain has resolved.  HTN- BP deteriorated since rampiril held due to worsening renal insufficiency.  Multiple extremely elevated readings while in hospital. Clonidine was increased to three times daily  CK stage III- cr at basline is 1.2. She did have contrast IV dye- Lab Results  Component Value Date   CREATININE 1.32* 02/07/2012      Patient Active Problem List  Diagnosis  . COLITIS ENTERIT&GASTROENTERIT INF ORIGIN  . PITUITARY ADENOMA  . ADRENAL MASS, BILATERAL  . PURE HYPERCHOLESTEROLEMIA  . HYPERLIPIDEMIA-MIXED  . TOBACCO ABUSE  . ADJUSTMENT DISORDER WITH ANXIETY  . HYPERTENSION, BENIGN ESSENTIAL  . CAD, NATIVE VESSEL  . ATRIAL FIBRILLATION  . CHRONIC DIASTOLIC  HEART FAILURE  . ATHEROSCLEROSIS W /INT CLAUDICATION  . PVD  . PNEUMONIA  . COPD  . ACUTE RESPIRATORY FAILURE  . DEGENERATIVE JOINT DISEASE, ADVANCED  . DEGENERATIVE JOINT DISEASE, LEFT HIP  . ARTHRITIS  . SHOULDER PAIN  . KNEE PAIN, BILATERAL  . DIZZINESS  . FATIGUE  . LYMPHADENOPATHY  . CAROTID BRUIT, RIGHT  . CHEST PAIN, ATYPICAL  . ABDOMINAL PAIN RIGHT UPPER QUADRANT  . MICROALBUMINURIA  . CARDIAC PACEMAKER IN SITU  . Other specified general medical examination  . Pneumonia  . Renal insufficiency  . Weight loss  . Nonspecific (abnormal) findings on radiological and other examination of gastrointestinal tract  . Abdominal pain, generalized  . Diverticulosis of colon (without mention of hemorrhage)  . PVD (peripheral vascular disease)  . Tachy-brady syndrome  . COPD (chronic obstructive pulmonary disease)  . Hyperlipidemia  . Diastolic congestive heart failure  . Chronic kidney disease  . Carotid artery disease  . Bright red blood per rectum  . Abdominal pain, left upper quadrant  . Mild protein-calorie malnutrition   Past Medical History  Diagnosis Date  . Coronary artery disease     acute MI 2003, nonstemi 2008  . MI (myocardial infarction) 2003  . PVD (peripheral vascular disease)   . History of atrial flutter 2009    ablation  . Tachy-brady syndrome   . Paroxysmal atrial fibrillation   . COPD (chronic obstructive pulmonary disease)   . Hyperlipidemia   . Diabetes mellitus   .  Diastolic congestive heart failure   . Arthritis   . Chronic kidney disease   . Carotid artery disease   . SSS (sick sinus syndrome)     ablation 2009  . Pneumonia   . Anxiety     adjustment disorder   Past Surgical History  Procedure Date  . Cardiac catheterization     2003 cardiac stent  . Insert / replace / remove pacemaker 2009    [employer]  . Cholecystectomy   . Total knee arthroplasty     bilateral  . Bilateral hip arthroscopy   . 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 Myocardial perfusion study     EF 59% 4/04  . Adenosine myoview study     abn EF 53% 12/04/04  . Carotid doppler 2/99  . Echo with lvh 1/98    old records  . Stress cardiolite 3/04    EF 53%  . Gamma knife radiosurg 08/25/06    [facility]  . Pvd     with stent in the right iliac   . Colonoscopy 01/27/2012    Procedure: COLONOSCOPY;  Surgeon: Louis Meckel, MD;  Location: [facility] ENDOSCOPY;  Service: Endoscopy;  Laterality: N/A;   History  Substance Use Topics  . Smoking status: Former Smoker -- 0.5 packs/day for 0 years    Types: Cigarettes    Quit date: 02/22/2011  . Smokeless tobacco: Never Used  . Alcohol Use: No   Family History  Problem Relation Age of Onset  . Cancer Neg Hx   . Depression      ?  . Diabetes      DM and HBP- family hx   Allergies  Allergen Reactions  . Naproxen     REACTION: gastritis   Current Outpatient Prescriptions on File Prior to Visit  Medication Sig Dispense Refill  . acetaminophen (TYLENOL EX ST ARTHRITIS PAIN) 500 MG tablet Take 1-2 tablets (500-1,000 mg total) by mouth every 6 (six) hours as needed for pain.      [name] aspirin 81 MG tablet Take 81 mg by mouth daily.      [name] atorvastatin (LIPITOR) 40 MG tablet Take 1 tablet (40 mg total) by mouth daily.  30 tablet  6  . carvedilol (COREG) 6.25 MG tablet Take 1 tablet (6.25 mg total) by mouth 2 (two) times daily with a meal.  60 tablet  1  . cloNIDine (CATAPRES) 0.2 MG tablet Take 1 tablet (0.2 mg total) by mouth 3 (three) times daily.  90 tablet  1  . digoxin (LANOXIN) 0.125 MG tablet Take 1 tablet (125 mcg total) by mouth daily.  30 tablet  6  . docusate sodium (COLACE) 100 MG capsule Take 1 capsule (100 mg total) by mouth 2 (two) times daily.  60 capsule  4  . metFORMIN (GLUMETZA) 500 MG (MOD) 24 hr tablet Take 1 tablet (500 mg total) by mouth 2 (two) times daily with a meal.  60 tablet  6  . mirtazapine (REMERON) 30 MG tablet Take 1  tablet (30 mg total) by mouth at bedtime.  30 tablet  11  . Multiple Vitamin (MULTIVITAMIN WITH MINERALS) TABS Take 1 tablet by mouth daily.      . nitroGLYCERIN (NITROSTAT) 0.4 MG SL tablet Place 0.4 mg under the tongue every 5 (five) minutes as needed.        . ondansetron (ZOFRAN) 4 MG tablet Take 4 mg by mouth every 8 (eight)  hours as needed.       The PMH, PSH, Social History, Family History, Medications, and allergies have been reviewed in [facility], and have been updated if relevant.    Physical Exam: BP 180/62  Pulse 60  Temp 97.7 F (36.5 C)  Wt 144 lb (65.318 kg) Wt Readings from Last 3 Encounters:  02/14/12 144 lb (65.318 kg)  02/07/12 151 lb 10.8 oz (68.8 kg)  01/26/12 134 lb 6.4 oz (60.963 kg)     Chronically ill appearing elderly woman, NAD HEENT: Unremarkable Neck:  No JVD, no thyromegally Lungs:  Clear with no wheezes, rales, or rhonchi. HEART:  Irregularly irregular, no murmurs, no rubs, no clicks Abd:  soft, positive bowel sounds, no organomegally, no rebound, no guarding Ext:  2 plus pulses, no edema, no cyanosis, no clubbing Skin:  No rashes no nodules Neuro:  CN II through XII intact, motor grossly intact   Assessment and Plan: 1. Abdominal pain, left upper quadrant  New resolved.  Has advanced diet and actually gaining a little weight.   2. Chronic kidney disease  Deteriorated.  Recheck Cr today. Basic Metabolic Panel  3. Bright red blood per rectum  Resolved.   CBC with Differential  4. HYPERTENSION, BENIGN ESSENTIAL Deteriorated. Will add Hydralazine 25 mg three times daily and ask cardiology to see her. The patient indicates understanding of these issues and agrees with the plan.   Ambulatory referral to Cardiology  5. Atrial fibrillation  Rate controlled. On digoxin. Ambulatory referral to Cardiology

## 2012-02-15 ENCOUNTER — Other Ambulatory Visit: Payer: Self-pay | Admitting: *Deleted

## 2012-02-15 MED ORDER — ATORVASTATIN CALCIUM 40 MG PO TABS: 40.0000 mg | ORAL_TABLET | Freq: Every day | ORAL | Status: DC

## 2012-02-18 ENCOUNTER — Encounter: Payer: Self-pay | Admitting: Cardiovascular Disease

## 2012-02-18 ENCOUNTER — Ambulatory Visit (INDEPENDENT_AMBULATORY_CARE_PROVIDER_SITE_OTHER): Payer: Medicare Other | Admitting: Cardiovascular Disease

## 2012-02-18 VITALS — BP 158/63 | HR 60 | Ht 65.0 in | Wt 145.5 lb

## 2012-02-18 DIAGNOSIS — I779 Disorder of arteries and arterioles, unspecified: Secondary | ICD-10-CM

## 2012-02-18 DIAGNOSIS — I509 Heart failure, unspecified: Secondary | ICD-10-CM

## 2012-02-18 DIAGNOSIS — I5030 Unspecified diastolic (congestive) heart failure: Secondary | ICD-10-CM

## 2012-02-18 DIAGNOSIS — I4891 Unspecified atrial fibrillation: Secondary | ICD-10-CM

## 2012-02-18 DIAGNOSIS — E785 Hyperlipidemia, unspecified: Secondary | ICD-10-CM

## 2012-02-18 DIAGNOSIS — R609 Edema, unspecified: Secondary | ICD-10-CM

## 2012-02-18 DIAGNOSIS — I2510 Atherosclerotic heart disease of native coronary artery without angina pectoris: Secondary | ICD-10-CM

## 2012-02-18 MED ORDER — FUROSEMIDE 20 MG PO TABS: 20.0000 mg | ORAL_TABLET | Freq: Every day | ORAL | Status: DC

## 2012-02-18 NOTE — Patient Instructions (Addendum)
You are doing well. Pleas restart lasix one a day until the edema improves,  Then decrease the dose to 1/2 pill daily or full pill every other day  Please call us if you have new issues that need to be addressed before your next appt.  Your physician wants you to follow-up in: 3 months.

## 2012-02-18 NOTE — Assessment & Plan Note (Signed)
She reports that she is followed by Dr. Lorretta Harp for her carotid and peripheral vascular disease.

## 2012-02-18 NOTE — Assessment & Plan Note (Signed)
Currently with no symptoms of angina. No further workup at this time. Continue current medication regimen. We have recommended she stop smoking.

## 2012-02-18 NOTE — Assessment & Plan Note (Addendum)
Mild acute on chronic diastolic CHF. We have suggested she restart her Lasix daily. After several pounds of weight loss, improvement of her lower extremity edema, she could take Lasix every other day. I suspect we will need to run her creatinine mildly elevated and she most likely has mild pulmonary hypertension/diastolic heart failure from long history of smoking.  We have counseled her on decreasing her oral fluid intake.

## 2012-02-18 NOTE — Assessment & Plan Note (Signed)
Worsening edema today. We'll restart Lasix we can use this on more of a as needed basis once her edema has improved. She also notes some abdominal bloating likely from fluid retention.

## 2012-02-18 NOTE — Assessment & Plan Note (Signed)
Goal LDL less than 70. We have suggested she stay on her statin.

## 2012-02-18 NOTE — Progress Notes (Signed)
Patient ID: [name], female    DOB: March 16, 1924, 78 y.o.   MRN: [id number]  HPI Comments: [name] is a very pleasant 78 year old woman with past medical history of coronary artery disease, stent placed to her left circumflex in 2003 which was patent on catheterization in 2008, history of peripheral vascular disease with bilateral carotid disease, 79% of the right internal carotid, 40-59% left internal carotid artery, also history of COPD, history of respiratory failure in August 2009 secondary to rapid atrial fibrillation and diastolic relaxation abnormality, history of sick sinus syndrome with ablation in May of 2009 with discontinuation of Coumadin in September of 09 secondary to bleeding.  She has had a recent hospital admission from October 10 to March 03, 2011. This was for pneumonia. Gentle diuresis was started she had acute renal failure. She had mildly elevated troponin which was secondary to demand ischemia. Normal LV function on echocardiogram with mild MR and TR.  CT Scan in the hospital showed high-grade stenosis at the origin and proximal portion of the innominate artery, high-grade stenosis of the right carotid bifurcation, moderate stenosis of the proximal prevertebral left subclavian artery.  She reports having recent hospital admission x2 for abdominal pain, diagnosis of mesenteric disease, possible mild mesenteric ischemia. She reports that the second episode to the hospital was from a episode of constipation with bleeding. She was diagnosed with diverticuli. Her Lasix was held after discharge from the hospital. Her granddaughter reports that she has been drinking significant fluids at home and her weight is increasing, ankle and leg swelling is getting worse without Lasix.   history of knee replacement bilaterally over 10 years ago. She states that she seen her orthopedic physician sometime ago and x-rays were done though nothing was performed. She has tried  Aleve and Tylenol with no improvement of her pain.  EKG shows normal sinus rhythm with rate 60 beats per minute, nonspecific ST abnormality   Outpatient Encounter Prescriptions as of 02/18/2012  Medication Sig Dispense Refill  . acetaminophen (TYLENOL EX ST ARTHRITIS PAIN) 500 MG tablet Take 1-2 tablets (500-1,000 mg total) by mouth every 6 (six) hours as needed for pain.      [name] aspirin 81 MG tablet Take 81 mg by mouth daily.      [name] atorvastatin (LIPITOR) 40 MG tablet Take 1 tablet (40 mg total) by mouth daily.  30 tablet  3  . carvedilol (COREG) 6.25 MG tablet Take 1 tablet (6.25 mg total) by mouth 2 (two) times daily with a meal.  60 tablet  1  . cloNIDine (CATAPRES) 0.2 MG tablet Take 1 tablet (0.2 mg total) by mouth 3 (three) times daily.  90 tablet  1  . digoxin (LANOXIN) 0.125 MG tablet Take 1 tablet (125 mcg total) by mouth daily.  30 tablet  6  . docusate sodium (COLACE) 100 MG capsule Take 1 capsule (100 mg total) by mouth 2 (two) times daily.  60 capsule  4  . hydrALAZINE (APRESOLINE) 25 MG tablet Take 1 tablet (25 mg total) by mouth 3 (three) times daily.  90 tablet  3  . metFORMIN (GLUMETZA) 500 MG (MOD) 24 hr tablet Take 1 tablet (500 mg total) by mouth 2 (two) times daily with a meal.  60 tablet  6  . mirtazapine (REMERON) 30 MG tablet Take 1 tablet (30 mg total) by mouth at bedtime.  30 tablet  11  . Multiple Vitamin (MULTIVITAMIN WITH MINERALS) TABS Take 1 tablet by mouth daily.      [name]  nitroGLYCERIN (NITROSTAT) 0.4 MG SL tablet Place 0.4 mg under the tongue every 5 (five) minutes as needed.        . ondansetron (ZOFRAN) 4 MG tablet Take 4 mg by mouth every 8 (eight) hours as needed.        Review of Systems  Constitutional: Negative.        Insomnia  HENT: Negative.   Eyes: Negative.   Respiratory: Negative.   Cardiovascular: Positive for leg swelling.  Gastrointestinal: Negative.   Musculoskeletal: Negative.   Skin: Negative.   Neurological: Negative.   Hematological:  Negative.   Psychiatric/Behavioral: Positive for disturbed wake/sleep cycle.  All other systems reviewed and are negative.    BP 158/63  Pulse 60  Ht 5\' 5"  (1.651 m)  Wt 145 lb 8 oz (65.998 kg)  BMI 24.21 kg/m2  Physical Exam  Nursing note and vitals reviewed. Constitutional: She is oriented to person, place, and time. She appears well-developed and well-nourished.  HENT:  Head: Normocephalic.  Nose: Nose normal.  Mouth/Throat: Oropharynx is clear and moist.  Eyes: Conjunctivae normal are normal. Pupils are equal, round, and reactive to light.  Neck: Normal range of motion. Neck supple. No JVD present.  Cardiovascular: Normal rate, regular rhythm, S1 normal, S2 normal and intact distal pulses.  Exam reveals no gallop and no friction rub.   Murmur heard.  Systolic murmur is present with a grade of 2/6       Trace to 1+ pitting edema to the mid shins bilaterally  Pulmonary/Chest: Effort normal and breath sounds normal. No respiratory distress. She has no wheezes. She has no rales. She exhibits no tenderness.  Abdominal: Soft. Bowel sounds are normal. She exhibits no distension. There is no tenderness.  Musculoskeletal: Normal range of motion. She exhibits no edema and no tenderness.  Lymphadenopathy:    She has no cervical adenopathy.  Neurological: She is alert and oriented to person, place, and time. Coordination normal.  Skin: Skin is warm and dry. No rash noted. No erythema.  Psychiatric: She has a normal mood and affect. Her behavior is normal. Judgment and thought content normal.         Assessment and Plan

## 2012-03-30 ENCOUNTER — Other Ambulatory Visit: Payer: Self-pay | Admitting: *Deleted

## 2012-03-30 MED ORDER — CLONIDINE HCL 0.2 MG PO TABS: 0.2000 mg | ORAL_TABLET | Freq: Three times a day (TID) | ORAL | Status: DC

## 2012-04-03 ENCOUNTER — Encounter (HOSPITAL_COMMUNITY): Payer: Self-pay | Admitting: *Deleted

## 2012-04-03 ENCOUNTER — Inpatient Hospital Stay (HOSPITAL_COMMUNITY)
Admission: EM | Admit: 2012-04-03 | Discharge: 2012-04-07 | DRG: 149 | Disposition: A | Discharge status: Skilled Nursing Facility | Payer: Medicare Other | Attending: Internal Medicine | Admitting: Internal Medicine

## 2012-04-03 ENCOUNTER — Emergency Department (HOSPITAL_COMMUNITY): Payer: Medicare Other

## 2012-04-03 DIAGNOSIS — E7800 Pure hypercholesterolemia, unspecified: Secondary | ICD-10-CM

## 2012-04-03 DIAGNOSIS — R1084 Generalized abdominal pain: Secondary | ICD-10-CM

## 2012-04-03 DIAGNOSIS — D352 Benign neoplasm of pituitary gland: Secondary | ICD-10-CM | POA: Diagnosis present

## 2012-04-03 DIAGNOSIS — I10 Essential (primary) hypertension: Secondary | ICD-10-CM | POA: Diagnosis present

## 2012-04-03 DIAGNOSIS — I771 Stricture of artery: Secondary | ICD-10-CM | POA: Diagnosis present

## 2012-04-03 DIAGNOSIS — E278 Other specified disorders of adrenal gland: Secondary | ICD-10-CM

## 2012-04-03 DIAGNOSIS — Z85858 Personal history of malignant neoplasm of other endocrine glands: Secondary | ICD-10-CM

## 2012-04-03 DIAGNOSIS — M169 Osteoarthritis of hip, unspecified: Secondary | ICD-10-CM

## 2012-04-03 DIAGNOSIS — Z96659 Presence of unspecified artificial knee joint: Secondary | ICD-10-CM

## 2012-04-03 DIAGNOSIS — J189 Pneumonia, unspecified organism: Secondary | ICD-10-CM

## 2012-04-03 DIAGNOSIS — Z950 Presence of cardiac pacemaker: Secondary | ICD-10-CM

## 2012-04-03 DIAGNOSIS — M1990 Unspecified osteoarthritis, unspecified site: Secondary | ICD-10-CM

## 2012-04-03 DIAGNOSIS — K529 Noninfective gastroenteritis and colitis, unspecified: Secondary | ICD-10-CM

## 2012-04-03 DIAGNOSIS — M129 Arthropathy, unspecified: Secondary | ICD-10-CM

## 2012-04-03 DIAGNOSIS — N289 Disorder of kidney and ureter, unspecified: Secondary | ICD-10-CM | POA: Diagnosis present

## 2012-04-03 DIAGNOSIS — J4489 Other specified chronic obstructive pulmonary disease: Secondary | ICD-10-CM | POA: Diagnosis present

## 2012-04-03 DIAGNOSIS — E860 Dehydration: Secondary | ICD-10-CM | POA: Diagnosis present

## 2012-04-03 DIAGNOSIS — M25519 Pain in unspecified shoulder: Secondary | ICD-10-CM

## 2012-04-03 DIAGNOSIS — I495 Sick sinus syndrome: Secondary | ICD-10-CM

## 2012-04-03 DIAGNOSIS — I4891 Unspecified atrial fibrillation: Secondary | ICD-10-CM | POA: Diagnosis present

## 2012-04-03 DIAGNOSIS — J9600 Acute respiratory failure, unspecified whether with hypoxia or hypercapnia: Secondary | ICD-10-CM

## 2012-04-03 DIAGNOSIS — I5030 Unspecified diastolic (congestive) heart failure: Secondary | ICD-10-CM

## 2012-04-03 DIAGNOSIS — R809 Proteinuria, unspecified: Secondary | ICD-10-CM

## 2012-04-03 DIAGNOSIS — I129 Hypertensive chronic kidney disease with stage 1 through stage 4 chronic kidney disease, or unspecified chronic kidney disease: Secondary | ICD-10-CM | POA: Diagnosis present

## 2012-04-03 DIAGNOSIS — E785 Hyperlipidemia, unspecified: Secondary | ICD-10-CM | POA: Diagnosis present

## 2012-04-03 DIAGNOSIS — I5032 Chronic diastolic (congestive) heart failure: Secondary | ICD-10-CM | POA: Diagnosis present

## 2012-04-03 DIAGNOSIS — R0989 Other specified symptoms and signs involving the circulatory and respiratory systems: Secondary | ICD-10-CM

## 2012-04-03 DIAGNOSIS — Z008 Encounter for other general examination: Secondary | ICD-10-CM

## 2012-04-03 DIAGNOSIS — R1012 Left upper quadrant pain: Secondary | ICD-10-CM

## 2012-04-03 DIAGNOSIS — R609 Edema, unspecified: Secondary | ICD-10-CM

## 2012-04-03 DIAGNOSIS — I959 Hypotension, unspecified: Secondary | ICD-10-CM

## 2012-04-03 DIAGNOSIS — R42 Dizziness and giddiness: Principal | ICD-10-CM | POA: Diagnosis present

## 2012-04-03 DIAGNOSIS — M25569 Pain in unspecified knee: Secondary | ICD-10-CM

## 2012-04-03 DIAGNOSIS — Z9861 Coronary angioplasty status: Secondary | ICD-10-CM

## 2012-04-03 DIAGNOSIS — R1011 Right upper quadrant pain: Secondary | ICD-10-CM

## 2012-04-03 DIAGNOSIS — R5381 Other malaise: Secondary | ICD-10-CM | POA: Diagnosis present

## 2012-04-03 DIAGNOSIS — Z79899 Other long term (current) drug therapy: Secondary | ICD-10-CM

## 2012-04-03 DIAGNOSIS — N189 Chronic kidney disease, unspecified: Secondary | ICD-10-CM

## 2012-04-03 DIAGNOSIS — J449 Chronic obstructive pulmonary disease, unspecified: Secondary | ICD-10-CM | POA: Diagnosis present

## 2012-04-03 DIAGNOSIS — I70219 Atherosclerosis of native arteries of extremities with intermittent claudication, unspecified extremity: Secondary | ICD-10-CM

## 2012-04-03 DIAGNOSIS — R599 Enlarged lymph nodes, unspecified: Secondary | ICD-10-CM

## 2012-04-03 DIAGNOSIS — I6529 Occlusion and stenosis of unspecified carotid artery: Secondary | ICD-10-CM | POA: Diagnosis present

## 2012-04-03 DIAGNOSIS — R933 Abnormal findings on diagnostic imaging of other parts of digestive tract: Secondary | ICD-10-CM

## 2012-04-03 DIAGNOSIS — I509 Heart failure, unspecified: Secondary | ICD-10-CM | POA: Diagnosis present

## 2012-04-03 DIAGNOSIS — R634 Abnormal weight loss: Secondary | ICD-10-CM

## 2012-04-03 DIAGNOSIS — Z7982 Long term (current) use of aspirin: Secondary | ICD-10-CM

## 2012-04-03 DIAGNOSIS — N183 Chronic kidney disease, stage 3 unspecified: Secondary | ICD-10-CM | POA: Diagnosis present

## 2012-04-03 DIAGNOSIS — K5730 Diverticulosis of large intestine without perforation or abscess without bleeding: Secondary | ICD-10-CM

## 2012-04-03 DIAGNOSIS — R0789 Other chest pain: Secondary | ICD-10-CM

## 2012-04-03 DIAGNOSIS — I252 Old myocardial infarction: Secondary | ICD-10-CM

## 2012-04-03 DIAGNOSIS — E869 Volume depletion, unspecified: Secondary | ICD-10-CM | POA: Diagnosis present

## 2012-04-03 DIAGNOSIS — E441 Mild protein-calorie malnutrition: Secondary | ICD-10-CM

## 2012-04-03 DIAGNOSIS — K551 Chronic vascular disorders of intestine: Secondary | ICD-10-CM | POA: Diagnosis present

## 2012-04-03 DIAGNOSIS — R5383 Other fatigue: Secondary | ICD-10-CM

## 2012-04-03 DIAGNOSIS — Z96649 Presence of unspecified artificial hip joint: Secondary | ICD-10-CM

## 2012-04-03 DIAGNOSIS — I2510 Atherosclerotic heart disease of native coronary artery without angina pectoris: Secondary | ICD-10-CM | POA: Diagnosis present

## 2012-04-03 DIAGNOSIS — I739 Peripheral vascular disease, unspecified: Secondary | ICD-10-CM | POA: Diagnosis present

## 2012-04-03 DIAGNOSIS — F17200 Nicotine dependence, unspecified, uncomplicated: Secondary | ICD-10-CM | POA: Diagnosis present

## 2012-04-03 DIAGNOSIS — F4322 Adjustment disorder with anxiety: Secondary | ICD-10-CM | POA: Diagnosis present

## 2012-04-03 DIAGNOSIS — K625 Hemorrhage of anus and rectum: Secondary | ICD-10-CM

## 2012-04-03 HISTORY — DX: Vascular disorder of intestine, unspecified: K55.9

## 2012-04-03 HISTORY — DX: Neoplasm of unspecified behavior of endocrine glands and other parts of nervous system: D49.7

## 2012-04-03 HISTORY — DX: Diverticulosis of large intestine without perforation or abscess without bleeding: K57.30

## 2012-04-03 HISTORY — DX: Presence of cardiac pacemaker: Z95.0

## 2012-04-03 LAB — URINALYSIS, ROUTINE W REFLEX MICROSCOPIC
Glucose, UA: NEGATIVE mg/dL
Hgb urine dipstick: NEGATIVE
Ketones, ur: NEGATIVE mg/dL
Protein, ur: NEGATIVE mg/dL
Urobilinogen, UA: 0.2 mg/dL (ref 0.0–1.0)

## 2012-04-03 LAB — COMPREHENSIVE METABOLIC PANEL
AST: 20 U/L (ref 0–37)
Albumin: 3.8 g/dL (ref 3.5–5.2)
Alkaline Phosphatase: 87 U/L (ref 39–117)
BUN: 27 mg/dL — ABNORMAL HIGH (ref 6–23)
CO2: 26 mEq/L (ref 19–32)
Chloride: 99 mEq/L (ref 96–112)
Creatinine, Ser: 1.6 mg/dL — ABNORMAL HIGH (ref 0.50–1.10)
GFR calc non Af Amer: 28 mL/min — ABNORMAL LOW (ref >90)
Potassium: 4.2 mEq/L (ref 3.5–5.1)
Total Bilirubin: 0.2 mg/dL — ABNORMAL LOW (ref 0.3–1.2)

## 2012-04-03 LAB — CBC WITH DIFFERENTIAL/PLATELET
Basophils Relative: 0 % (ref 0–1)
HCT: 37.8 % (ref 36.0–46.0)
Hemoglobin: 13.1 g/dL (ref 12.0–15.0)
Lymphocytes Relative: 24 % (ref 12–46)
Lymphs Abs: 1.9 10*3/uL (ref 0.7–4.0)
MCHC: 34.7 g/dL (ref 30.0–36.0)
Monocytes Absolute: 0.8 10*3/uL (ref 0.1–1.0)
Monocytes Relative: 10 % (ref 3–12)
Neutro Abs: 5.1 10*3/uL (ref 1.7–7.7)
Neutrophils Relative %: 65 % (ref 43–77)
RBC: 4.08 MIL/uL (ref 3.87–5.11)
WBC: 7.9 10*3/uL (ref 4.0–10.5)

## 2012-04-03 MED ORDER — SODIUM CHLORIDE 0.9 % IV BOLUS (SEPSIS)
1000.0000 mL | Freq: Once | INTRAVENOUS | Status: AC
  Administered 2012-04-04: 1000 mL via INTRAVENOUS

## 2012-04-03 MED ORDER — SODIUM CHLORIDE 0.9 % IV BOLUS (SEPSIS)
1000.0000 mL | Freq: Once | INTRAVENOUS | Status: AC
  Administered 2012-04-03: 1000 mL via INTRAVENOUS

## 2012-04-03 NOTE — ED Notes (Signed)
Pt complaining of feeling like her heart is racing, Pt denies any other symptoms, repeat EKG preformed and given to EDP.

## 2012-04-03 NOTE — ED Notes (Signed)
Per EMS pt has had generalized weakness and dizziness with standing since yesterday.

## 2012-04-03 NOTE — ED Provider Notes (Signed)
History     CSN: [id number]  Arrival date & time 04/03/12  1931   First MD Initiated Contact with Patient 04/03/12 2034      Chief Complaint  Patient presents with  . Weakness  . Dizziness    (Consider location/radiation/quality/duration/timing/severity/associated sxs/prior treatment) Patient is a 78 y.o. female presenting with weakness. The history is provided by the patient.  Weakness  Additional symptoms include weakness.  She states that she had onset yesterday of dizziness and weakness when she stood up. She feels fine when she is lying down. Denies chest pain, heaviness, tightness, pressure. She denies nausea, vomiting, diarrhea. She denies dyspnea. She denies diaphoresis. Symptoms are severe. Nothing makes it better other than lying down. She also noted anorexia. She has a pacemaker in shoe is told that her pacemaker may not be working properly.  Past Medical History  Diagnosis Date  . Coronary artery disease     acute MI 2003, nonstemi 2008  . MI (myocardial infarction) 2003  . PVD (peripheral vascular disease)   . History of atrial flutter 2009    ablation  . Tachy-brady syndrome   . Paroxysmal atrial fibrillation   . COPD (chronic obstructive pulmonary disease)   . Hyperlipidemia   . Diabetes mellitus   . Diastolic congestive heart failure   . Arthritis   . Chronic kidney disease   . Carotid artery disease   . SSS (sick sinus syndrome)     ablation 2009  . Pneumonia   . Anxiety     adjustment disorder    Past Surgical History  Procedure Date  . Cardiac catheterization     2003 cardiac stent  . Insert / replace / remove pacemaker 2009    [employer] Jude  . Cholecystectomy   . Total knee arthroplasty     bilateral  . Bilateral hip arthroscopy   . 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 Myocardial perfusion study     EF 59% 4/04  . Adenosine myoview study     abn EF 53% 12/04/04  . Carotid doppler 2/99  . Echo with  lvh 1/98    old records  . Stress cardiolite 3/04    EF 53%  . Gamma knife radiosurg 08/25/06    [facility]  . Pvd     with stent in the right iliac   . Colonoscopy 01/27/2012    Procedure: COLONOSCOPY;  Surgeon: Louis Meckel, MD;  Location: [facility] ENDOSCOPY;  Service: Endoscopy;  Laterality: N/A;    Family History  Problem Relation Age of Onset  . Cancer Neg Hx   . Depression      ?  . Diabetes      DM and HBP- family hx    History  Substance Use Topics  . Smoking status: Former Smoker -- 0.5 packs/day for 0 years    Types: Cigarettes    Quit date: 02/22/2011  . Smokeless tobacco: Never Used  . Alcohol Use: No    OB History    Grav Para Term Preterm Abortions TAB SAB Ect Mult Living                  Review of Systems  Neurological: Positive for weakness.  All other systems reviewed and are negative.    Allergies  Naproxen  Home Medications   Current Outpatient Rx  Name  Route  Sig  Dispense  Refill  . ACETAMINOPHEN 500 MG PO TABS   Oral  Take 1-2 tablets (500-1,000 mg total) by mouth every 6 (six) hours as needed for pain.         . ASPIRIN 81 MG PO TABS   Oral   Take 81 mg by mouth daily.         . ATORVASTATIN CALCIUM 40 MG PO TABS   Oral   Take 1 tablet (40 mg total) by mouth daily.   30 tablet   3   . CARVEDILOL 6.25 MG PO TABS   Oral   Take 1 tablet (6.25 mg total) by mouth 2 (two) times daily with a meal.   60 tablet   1   . CLONIDINE HCL 0.2 MG PO TABS   Oral   Take 1 tablet (0.2 mg total) by mouth 3 (three) times daily.   90 tablet   5   . DIGOXIN 0.125 MG PO TABS   Oral   Take 1 tablet (125 mcg total) by mouth daily.   30 tablet   6   . DOCUSATE SODIUM 100 MG PO CAPS   Oral   Take 1 capsule (100 mg total) by mouth 2 (two) times daily.   60 capsule   4   . FUROSEMIDE 20 MG PO TABS   Oral   Take 1 tablet (20 mg total) by mouth daily.   90 tablet   3   . HYDRALAZINE HCL 25 MG PO TABS   Oral   Take 1 tablet (25 mg  total) by mouth 3 (three) times daily.   90 tablet   3   . METFORMIN HCL ER (MOD) 500 MG PO TB24   Oral   Take 1 tablet (500 mg total) by mouth 2 (two) times daily with a meal.   60 tablet   6   . MIRTAZAPINE 30 MG PO TABS   Oral   Take 1 tablet (30 mg total) by mouth at bedtime.   30 tablet   11   . ADULT MULTIVITAMIN W/MINERALS CH   Oral   Take 1 tablet by mouth daily.         [name] NITROGLYCERIN 0.4 MG SL SUBL   Sublingual   Place 0.4 mg under the tongue every 5 (five) minutes as needed. For chest pain         . ONDANSETRON HCL 4 MG PO TABS   Oral   Take 4 mg by mouth every 8 (eight) hours as needed. For nausea           BP 74/52  Pulse 60  Temp 97.6 F (36.4 C) (Oral)  Resp 25  SpO2 96%  Physical Exam  Nursing note and vitals reviewed. 78 year old female, resting comfortably and in no acute distress. Vital signs are significant for hypotension with blood pressure 74/52. Oxygen saturation is 96%, which is normal. Head is normocephalic and atraumatic. PERRLA, EOMI. Oropharynx is clear. Neck is nontender and supple without adenopathy or JVD. No carotid bruits are heard. Back is nontender and there is no CVA tenderness. Lungs are clear without rales, wheezes, or rhonchi. Chest is nontender. Heart has regular rate and rhythm without murmur. Abdomen is soft, flat, nontender without masses or hepatosplenomegaly and peristalsis is normoactive. Extremities have no cyanosis or edema, full range of motion is present. Skin is warm and dry without rash. Neurologic: Mental status is normal, cranial nerves are intact, there are no motor or sensory deficits.   ED Course  Procedures (including critical care time)  Results  for orders placed during the hospital encounter of 04/03/12  CBC WITH DIFFERENTIAL      Component Value Range   WBC 7.9  4.0 - 10.5 K/uL   RBC 4.08  3.87 - 5.11 MIL/uL   Hemoglobin 13.1  12.0 - 15.0 g/dL   HCT 16.1  09.6 - 04.5 %   MCV 92.6  78.0 -  100.0 fL   MCH 32.1  26.0 - 34.0 pg   MCHC 34.7  30.0 - 36.0 g/dL   RDW 40.9  81.1 - 91.4 %   Platelets 305  150 - 400 K/uL   Neutrophils Relative 65  43 - 77 %   Neutro Abs 5.1  1.7 - 7.7 K/uL   Lymphocytes Relative 24  12 - 46 %   Lymphs Abs 1.9  0.7 - 4.0 K/uL   Monocytes Relative 10  3 - 12 %   Monocytes Absolute 0.8  0.1 - 1.0 K/uL   Eosinophils Relative 1  0 - 5 %   Eosinophils Absolute 0.1  0.0 - 0.7 K/uL   Basophils Relative 0  0 - 1 %   Basophils Absolute 0.0  0.0 - 0.1 K/uL  COMPREHENSIVE METABOLIC PANEL      Component Value Range   Sodium 139  135 - 145 mEq/L   Potassium 4.2  3.5 - 5.1 mEq/L   Chloride 99  96 - 112 mEq/L   CO2 26  19 - 32 mEq/L   Glucose, Bld 93  70 - 99 mg/dL   BUN 27 (*) 6 - 23 mg/dL   Creatinine, Ser 7.82 (*) 0.50 - 1.10 mg/dL   Calcium 8.4  8.4 - 95.6 mg/dL   Total Protein 7.0  6.0 - 8.3 g/dL   Albumin 3.8  3.5 - 5.2 g/dL   AST 20  0 - 37 U/L   ALT 8  0 - 35 U/L   Alkaline Phosphatase 87  39 - 117 U/L   Total Bilirubin 0.2 (*) 0.3 - 1.2 mg/dL   GFR calc non Af Amer 28 (*) >90 mL/min   GFR calc Af Amer 33 (*) >90 mL/min  URINALYSIS, ROUTINE W REFLEX MICROSCOPIC      Component Value Range   Color, Urine YELLOW  YELLOW   APPearance CLEAR  CLEAR   Specific Gravity, Urine 1.008  1.005 - 1.030   pH 5.0  5.0 - 8.0   Glucose, UA NEGATIVE  NEGATIVE mg/dL   Hgb urine dipstick NEGATIVE  NEGATIVE   Bilirubin Urine NEGATIVE  NEGATIVE   Ketones, ur NEGATIVE  NEGATIVE mg/dL   Protein, ur NEGATIVE  NEGATIVE mg/dL   Urobilinogen, UA 0.2  0.0 - 1.0 mg/dL   Nitrite NEGATIVE  NEGATIVE   Leukocytes, UA NEGATIVE  NEGATIVE  TROPONIN I      Component Value Range   Troponin I <0.30  <0.30 ng/mL     Date: 04/03/2012 2102  Rate: 107  Rhythm: AV dual paced rhythm  QRS Axis: left  Intervals: normal  Narrative Interpretation: AV dual paced rhythm. Unable to assess QRS or ST segment or T-wave because of pacemaker. When compared with ECG of 02/18/2012, AV dual  paced rhythm has replaced sinus rhythm.  Old EKG Reviewed: unchanged   Date: 04/03/2012 2249  Rate: 111  Rhythm: AV dual paced rhythm  QRS Axis: left  Intervals: normal  Narrative Interpretation: AV dual paced rhythm. Unable to assess QRS or ST segment or T-wave because of pacemaker. When compared with ECG of  02/18/2012, AV dual paced rhythm has replaced sinus rhythm.  Old EKG Reviewed: unchanged   1. Hypotension     CRITICAL CARE Performed by: Dione Booze   Total critical care time: 40 minutes  Critical care time was exclusive of separately billable procedures and treating other patients.  Critical care was necessary to treat or prevent imminent or life-threatening deterioration.  Critical care was time spent personally by me on the following activities: development of treatment plan with patient and/or surrogate as well as nursing, discussions with consultants, evaluation of patient's response to treatment, examination of patient, obtaining history from patient or surrogate, ordering and performing treatments and interventions, ordering and review of laboratory studies, ordering and review of radiographic studies, pulse oximetry and re-evaluation of patient's condition.   MDM  Weakness and hypotension of uncertain cause. Workup has been initiated. She will be treated with IV fluid bolus and blood pressure response assessed. Old records are reviewed, and she had pacemaker inserted about 5 years ago for tachybradycardia syndrome and she also had ablation of focus for atrial flutter. Last check with cardiologist indicated she was in atrial fibrillation less than 1% of the time.  Workup is unremarkable including negative troponin. There's been a slight rise in BUN and creatinine over baseline. Blood pressures come up with one liter of saline. Case is discussed with Dr. Toniann Fail of [facility] who agrees to admit the patient. He requests that serum digoxin level be  obtained.     Dione Booze, MD 04/03/12 [phone]

## 2012-04-04 ENCOUNTER — Encounter (HOSPITAL_COMMUNITY): Payer: Self-pay | Admitting: *Deleted

## 2012-04-04 DIAGNOSIS — K551 Chronic vascular disorders of intestine: Secondary | ICD-10-CM

## 2012-04-04 DIAGNOSIS — I4891 Unspecified atrial fibrillation: Secondary | ICD-10-CM

## 2012-04-04 DIAGNOSIS — I771 Stricture of artery: Secondary | ICD-10-CM | POA: Diagnosis present

## 2012-04-04 DIAGNOSIS — I959 Hypotension, unspecified: Secondary | ICD-10-CM | POA: Diagnosis present

## 2012-04-04 DIAGNOSIS — I2510 Atherosclerotic heart disease of native coronary artery without angina pectoris: Secondary | ICD-10-CM

## 2012-04-04 DIAGNOSIS — J449 Chronic obstructive pulmonary disease, unspecified: Secondary | ICD-10-CM

## 2012-04-04 DIAGNOSIS — E860 Dehydration: Secondary | ICD-10-CM | POA: Diagnosis present

## 2012-04-04 DIAGNOSIS — I6529 Occlusion and stenosis of unspecified carotid artery: Secondary | ICD-10-CM | POA: Diagnosis present

## 2012-04-04 HISTORY — DX: Chronic vascular disorders of intestine: K55.1

## 2012-04-04 HISTORY — DX: Stricture of artery: I77.1

## 2012-04-04 LAB — COMPREHENSIVE METABOLIC PANEL
CO2: 26 mEq/L (ref 19–32)
Calcium: 7.4 mg/dL — ABNORMAL LOW (ref 8.4–10.5)
Creatinine, Ser: 1.42 mg/dL — ABNORMAL HIGH (ref 0.50–1.10)
GFR calc Af Amer: 38 mL/min — ABNORMAL LOW (ref >90)
GFR calc non Af Amer: 32 mL/min — ABNORMAL LOW (ref >90)
Glucose, Bld: 122 mg/dL — ABNORMAL HIGH (ref 70–99)
Total Bilirubin: 0.2 mg/dL — ABNORMAL LOW (ref 0.3–1.2)

## 2012-04-04 LAB — GLUCOSE, CAPILLARY
Glucose-Capillary: 105 mg/dL — ABNORMAL HIGH (ref 70–99)
Glucose-Capillary: 95 mg/dL (ref 70–99)

## 2012-04-04 LAB — CBC WITH DIFFERENTIAL/PLATELET
Eosinophils Relative: 1 % (ref 0–5)
HCT: 34 % — ABNORMAL LOW (ref 36.0–46.0)
Hemoglobin: 11.5 g/dL — ABNORMAL LOW (ref 12.0–15.0)
Lymphocytes Relative: 28 % (ref 12–46)
Lymphs Abs: 2 10*3/uL (ref 0.7–4.0)
MCV: 93.2 fL (ref 78.0–100.0)
Monocytes Absolute: 0.8 10*3/uL (ref 0.1–1.0)
Monocytes Relative: 12 % (ref 3–12)
RBC: 3.65 MIL/uL — ABNORMAL LOW (ref 3.87–5.11)
WBC: 7.1 10*3/uL (ref 4.0–10.5)

## 2012-04-04 MED ORDER — DIGOXIN 125 MCG PO TABS
125.0000 ug | ORAL_TABLET | Freq: Every day | ORAL | Status: DC
  Administered 2012-04-04 – 2012-04-07 (×3): 125 ug via ORAL
  Filled 2012-04-04 (×4): qty 1

## 2012-04-04 MED ORDER — ONDANSETRON HCL 4 MG/2ML IJ SOLN: 4.0000 mg | Freq: Four times a day (QID) | INTRAMUSCULAR | Status: DC | PRN

## 2012-04-04 MED ORDER — ACETAMINOPHEN 325 MG PO TABS: 650.0000 mg | ORAL_TABLET | Freq: Four times a day (QID) | ORAL | Status: DC | PRN

## 2012-04-04 MED ORDER — ONDANSETRON HCL 4 MG/2ML IJ SOLN: 4.0000 mg | Freq: Three times a day (TID) | INTRAMUSCULAR | Status: DC | PRN

## 2012-04-04 MED ORDER — SODIUM CHLORIDE 0.9 % IV SOLN
INTRAVENOUS | Status: DC
  Administered 2012-04-04: 02:00:00 via INTRAVENOUS

## 2012-04-04 MED ORDER — ACETAMINOPHEN 650 MG RE SUPP: 650.0000 mg | Freq: Four times a day (QID) | RECTAL | Status: DC | PRN

## 2012-04-04 MED ORDER — CARVEDILOL 6.25 MG PO TABS
6.2500 mg | ORAL_TABLET | Freq: Two times a day (BID) | ORAL | Status: DC
  Administered 2012-04-04 – 2012-04-07 (×6): 6.25 mg via ORAL
  Filled 2012-04-04 (×8): qty 1

## 2012-04-04 MED ORDER — DOCUSATE SODIUM 100 MG PO CAPS
100.0000 mg | ORAL_CAPSULE | Freq: Two times a day (BID) | ORAL | Status: DC
  Administered 2012-04-04 – 2012-04-06 (×4): 100 mg via ORAL
  Filled 2012-04-04 (×8): qty 1

## 2012-04-04 MED ORDER — ENSURE COMPLETE PO LIQD
237.0000 mL | Freq: Three times a day (TID) | ORAL | Status: DC
  Administered 2012-04-04 – 2012-04-07 (×10): 237 mL via ORAL

## 2012-04-04 MED ORDER — SODIUM CHLORIDE 0.9 % IJ SOLN
3.0000 mL | Freq: Two times a day (BID) | INTRAMUSCULAR | Status: DC
  Administered 2012-04-04: 3 mL via INTRAVENOUS

## 2012-04-04 MED ORDER — HYDRALAZINE HCL 25 MG PO TABS
25.0000 mg | ORAL_TABLET | Freq: Three times a day (TID) | ORAL | Status: DC
  Administered 2012-04-04 (×2): 25 mg via ORAL
  Filled 2012-04-04 (×5): qty 1

## 2012-04-04 MED ORDER — ASPIRIN EC 81 MG PO TBEC
81.0000 mg | DELAYED_RELEASE_TABLET | Freq: Every day | ORAL | Status: DC
  Administered 2012-04-04 – 2012-04-07 (×4): 81 mg via ORAL
  Filled 2012-04-04 (×4): qty 1

## 2012-04-04 MED ORDER — INSULIN ASPART 100 UNIT/ML ~~LOC~~ SOLN
0.0000 [IU] | Freq: Three times a day (TID) | SUBCUTANEOUS | Status: DC
  Administered 2012-04-05 (×2): 2 [IU] via SUBCUTANEOUS
  Administered 2012-04-06: 1 [IU] via SUBCUTANEOUS
  Administered 2012-04-06 – 2012-04-07 (×2): 2 [IU] via SUBCUTANEOUS

## 2012-04-04 MED ORDER — ATORVASTATIN CALCIUM 40 MG PO TABS
40.0000 mg | ORAL_TABLET | Freq: Every day | ORAL | Status: DC
  Administered 2012-04-04 – 2012-04-07 (×4): 40 mg via ORAL
  Filled 2012-04-04 (×4): qty 1

## 2012-04-04 MED ORDER — SODIUM CHLORIDE 0.9 % IV SOLN: INTRAVENOUS | Status: AC

## 2012-04-04 MED ORDER — CLONIDINE HCL 0.2 MG PO TABS
0.2000 mg | ORAL_TABLET | Freq: Three times a day (TID) | ORAL | Status: DC
  Administered 2012-04-04 (×2): 0.2 mg via ORAL
  Filled 2012-04-04 (×5): qty 1

## 2012-04-04 MED ORDER — ONDANSETRON HCL 4 MG PO TABS: 4.0000 mg | ORAL_TABLET | Freq: Four times a day (QID) | ORAL | Status: DC | PRN

## 2012-04-04 NOTE — Progress Notes (Signed)
Pt transferred via stretcher on tele monitor with ER RN at side. Oriented patient to unit and room. Instructed on call bell and placed at side. VSS at this time. Paged admit MD to notify. No family at this time. Will continue to monitor.

## 2012-04-04 NOTE — ED Notes (Signed)
FS of 95

## 2012-04-04 NOTE — Progress Notes (Signed)
[facility] Progress Note  TEAM 1 - Stepdown/ICU TEAM   [name] ZOX:[id number] DOB: Jun 12, 1923 DOA: 04/03/2012 PCP: Ruthe Mannan, MD  Brief narrative: 78 year old female patient with history of known CAD and stents as well as significant peripheral vascular disease. She was brought to the emergency department because of complaints of dizziness and weakness. It is noted that her Lasix was resumed in the beginning of October. She was brought to the emergency department via EMS and was found to be hypotensive. No other associated symptoms. She did not lose consciousness. Her labs were unremarkable. She was given 2 L of normal saline and her blood pressure include to 90 systolic range. During the admitting physician's examination the patient endorsed having some brief chest discomfort. She was subsequently admitted to the step down unit for further monitoring and treatment.  Assessment/Plan: Principal Problem:  *Hypotension/?? Subclavian artery stenosis, right *Unclear which arm blood pressures were being obtained in at time of admission *Since admission patient's blood pressures have remained low when taken in the right arm but when taken in the left arm patient was actually hypertensive *Doubt accuracy of initial low blood pressure readings so we'll pursue a workup for possible subclavian stenosis by ordering arterial duplex studies  Active Problems:  HYPERTENSION, BENIGN ESSENTIAL *Now that blood pressures been clarified we'll resume all home antihypertensive medication   PVD (peripheral vascular disease)/Carotid stenosis *As above regarding subclavian stenosis risk factor   Dehydration *Patient may truly have been volume depleted from outpatient addition of diuretics but since we do not know which arm blood pressures were taken in we cannot truly determine if her dizziness was related to volume depletion or due to elevated blood pressures *Since she is normotensive  and making adequate urinary output and renal function is improving we will place IV fluids at keep open *BUN has decreased with rehydration and baseline BUN around 18 to 20    CAD, NATIVE VESSEL-s/p stents *Transient chest pain in the ER but has not recurred *Mild bump in troponin may be related to altered perfusion either from severe hypertension or hypotension-see discussion above   Atrial fibrillation/ Cardiac pacemaker in situ *Currently rate controlled with 100 percent paced rhythm   Chronic diastolic heart failure *Compensated   CKD (chronic kidney disease) stage 3, GFR 30-59 ml/min *Likely related to volume depletion from diuretics so these remain on hold *It had significantly elevated blood pressures this could also contribute to acute decompensation and renal function   SMA stenosis-moderate *No current complaints of abdominal pain   history of PITUITARY ADENOMA- s/p gamma knife *Remote issue   Adjustment disorder with anxiety *Mood and affect appear appropriate   COPD *Appears compensated without wheezing   DVT prophylaxis: SCDs Code Status: Full Family Communication: Spoke with patient Disposition Plan: Transfer to telemetry  Consultants: None  Procedures: None  Antibiotics: None  HPI/Subjective: Patient somewhat groggy this morning states she is sleepy but otherwise is appropriate. No complaints of chest pain or shortness of breath verbalized   Objective: Blood pressure 139/43, pulse 69, temperature 98.3 F (36.8 C), temperature source Oral, resp. rate 18, height 5\' 5"  (1.651 m), weight 60.7 kg (133 lb 13.1 oz), SpO2 98.00%.  Intake/Output Summary (Last 24 hours) at 04/04/12 1457 Last data filed at 04/04/12 1200  Gross per 24 hour  Intake   2693 ml  Output    351 ml  Net   2342 ml     Exam: Follow up exam completed. No discrepancy between  right arm and left arm blood pressure readings with right arm being markedly lower than the left  arm  Data Reviewed: Basic Metabolic Panel:  Lab 04/04/12 9562 04/03/12 2112  NA 141 139  K 3.4* 4.2  CL 104 99  CO2 26 26  GLUCOSE 122* 93  BUN 24* 27*  CREATININE 1.42* 1.60*  CALCIUM 7.4* 8.4  MG -- --  PHOS -- --   Liver Function Tests:  Lab 04/04/12 0149 04/03/12 2112  AST 16 20  ALT 7 8  ALKPHOS 74 87  BILITOT 0.2* 0.2*  PROT 6.0 7.0  ALBUMIN 3.2* 3.8   No results found for this basename: LIPASE:5,AMYLASE:5 in the last 168 hours No results found for this basename: AMMONIA:5 in the last 168 hours CBC:  Lab 04/04/12 0149 04/03/12 2112  WBC 7.1 7.9  NEUTROABS 4.2 5.1  HGB 11.5* 13.1  HCT 34.0* 37.8  MCV 93.2 92.6  PLT 285 305   Cardiac Enzymes:  Lab 04/04/12 0945 04/04/12 0146 04/03/12 2113  CKTOTAL -- -- --  CKMB -- -- --  CKMBINDEX -- -- --  TROPONINI 0.45* 0.32* <0.30   BNP (last 3 results)  Basename 05/31/11 1242  PROBNP 542.0*   CBG:  Lab 04/04/12 1138 04/04/12 0745 04/04/12 0052  GLUCAP 116* 105* 95    Recent Results (from the past 240 hour(s))  MRSA PCR SCREENING     Status: Normal   Collection Time   04/04/12  2:00 AM      Component Value Range Status Comment   MRSA by PCR NEGATIVE  NEGATIVE Final      Studies:  Recent x-ray studies have been reviewed in detail by the Attending Physician  Scheduled Meds:  Reviewed in detail by the Attending Physician   Junious Silk, ANP [facility] Office  [phone] Pager [phone]  On-Call/Text Page:      [email or web address]      password [email or web address]  If 7PM-7AM, please contact night-coverage [email or web address] Password [facility] 04/04/2012, 2:57 PM   LOS: 1 day   I have examined the patient, reviewed the chart and modified the above note which I agree with.   Calvert Cantor, MD [phone]

## 2012-04-04 NOTE — Progress Notes (Signed)
INITIAL ADULT NUTRITION ASSESSMENT Date: 04/04/2012   Time: 3:25 PM  Reason for Assessment: Nutrition Risk report (MST=2)  INTERVENTION:  Ensure Complete PO TID, each supplement provides 350 kcal and 13 grams of protein.  DOCUMENTATION CODES Per approved criteria  -Not Applicable   ASSESSMENT: Female 78 y.o.  Dx: Hypotension  Hx:  Past Medical History  Diagnosis Date  . Coronary artery disease     acute MI 2003, nonstemi 2008  . MI (myocardial infarction) 2003  . PVD (peripheral vascular disease)   . History of atrial flutter 2009    ablation  . Tachy-brady syndrome   . Paroxysmal atrial fibrillation   . COPD (chronic obstructive pulmonary disease)   . Hyperlipidemia   . Diabetes mellitus   . Diastolic congestive heart failure   . Arthritis   . Chronic kidney disease   . Carotid artery disease   . SSS (sick sinus syndrome)     ablation 2009  . Pneumonia   . Anxiety     adjustment disorder  . Pacemaker     Past Surgical History  Procedure Date  . Cardiac catheterization     2003 cardiac stent  . Insert / replace / remove pacemaker 2009    [facility]  . Cholecystectomy   . Total knee arthroplasty     bilateral  . Bilateral hip arthroscopy   . 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 Myocardial perfusion study     EF 59% 4/04  . Adenosine myoview study     abn EF 53% 12/04/04  . Carotid doppler 2/99  . Echo with lvh 1/98    old records  . Stress cardiolite 3/04    EF 53%  . Gamma knife radiosurg 08/25/06    [facility]  . Pvd     with stent in the right iliac   . Colonoscopy 01/27/2012    Procedure: COLONOSCOPY;  Surgeon: Louis Meckel, MD;  Location[facility] ENDOSCOPY;  Service: Endoscopy;  Laterality: N/A;    Related Meds:  Scheduled Meds:   . aspirin EC  81 mg Oral Daily  . atorvastatin  40 mg Oral Daily  . carvedilol  6.25 mg Oral BID WC  . cloNIDine  0.2 mg Oral TID  . digoxin  125 mcg Oral Daily  .  docusate sodium  100 mg Oral BID  . hydrALAZINE  25 mg Oral TID  . insulin aspart  0-9 Units Subcutaneous TID WC  . [COMPLETED] sodium chloride  1,000 mL Intravenous Once  . [COMPLETED] sodium chloride  1,000 mL Intravenous Once  . [COMPLETED] sodium chloride   Intravenous STAT  . [DISCONTINUED] sodium chloride  3 mL Intravenous Q12H   Continuous Infusions:   . [EXPIRED] sodium chloride 150 mL/hr at 04/04/12 0200   PRN Meds:.acetaminophen, acetaminophen, ondansetron (ZOFRAN) IV, ondansetron, [DISCONTINUED] ondansetron (ZOFRAN) IV   Ht: 5\' 5"  (165.1 cm)  Wt: 133 lb 13.1 oz (60.7 kg)  Ideal Wt: 56.8 kg % Ideal Wt: 107%  Usual Wt:  Wt Readings from Last 10 Encounters:  04/04/12 133 lb 13.1 oz (60.7 kg)  02/18/12 145 lb 8 oz (65.998 kg)  02/14/12 144 lb (65.318 kg)  02/07/12 151 lb 10.8 oz (68.8 kg)  01/26/12 134 lb 6.4 oz (60.963 kg)  01/26/12 134 lb 6.4 oz (60.963 kg)  01/26/12 134 lb 6.4 oz (60.963 kg)  11/09/11 128 lb (58.06 kg)  11/02/11 124 lb (56.246 kg)  05/31/11 154 lb 4  oz (69.967 kg)   % Usual Wt: 92%  Body mass index is 22.27 kg/(m^2).  Food/Nutrition Related Hx: 8% weight loss in the past 6 weeks, suspect at least partially related to fluids.  Eats a regular diet at home, but doesn't eat very much.  Drinks Ensure 2-3 times daily.  Labs:  CMP     Component Value Date/Time   NA 141 04/04/2012 0149   NA 140 11/02/2011 1611   K 3.4* 04/04/2012 0149   CL 104 04/04/2012 0149   CO2 26 04/04/2012 0149   GLUCOSE 122* 04/04/2012 0149   GLUCOSE 104* 11/02/2011 1611   BUN 24* 04/04/2012 0149   BUN 20 11/02/2011 1611   CREATININE 1.42* 04/04/2012 0149   CALCIUM 7.4* 04/04/2012 0149   PROT 6.0 04/04/2012 0149   PROT 6.6 11/02/2011 1611   ALBUMIN 3.2* 04/04/2012 0149   AST 16 04/04/2012 0149   ALT 7 04/04/2012 0149   ALKPHOS 74 04/04/2012 0149   BILITOT 0.2* 04/04/2012 0149   GFRNONAA 32* 04/04/2012 0149   GFRAA 38* 04/04/2012 0149    CBG (last 3)   Basename  04/04/12 1138 04/04/12 0745 04/04/12 0052  GLUCAP 116* 105* 95     Intake/Output Summary (Last 24 hours) at 04/04/12 1528 Last data filed at 04/04/12 1200  Gross per 24 hour  Intake   2693 ml  Output    351 ml  Net   2342 ml     Diet Order: CHO-modified medium   IVF:    [EXPIRED] sodium chloride Last Rate: 150 mL/hr at 04/04/12 0200    Estimated Nutritional Needs:   Kcal: 1500-1700 Protein: 75-85 gm Fluid: 1.5-1.7 L  Patient reports poor intake over the past several months related to inability to taste.  Likes vanilla Ensure, drinks 2-3 per day at home.  Some weight loss suspected, but unable to quantify.  Suspect weight loss is at least partially related to fluid status.  Patient states that she consumed ~25% of breakfast and lunch today.  Patient is at nutrition risk given recent poor PO intake and weight loss.  NUTRITION DIAGNOSIS: Inadequate oral intake related to altered taste as evidenced by suspected weight loss PTA and 25% meal completion.  MONITORING/EVALUATION(Goals): Goal:  Intake to meet >90% of estimated nutrition needs. Monitor:  PO intake, labs, weight trend, supplement tolerance.  EDUCATION NEEDS: -No education needs identified at this time   Joaquin Courts, RD, LDN, CNSC Pager# [phone] After Hours Pager# [phone]  04/04/2012, 3:25 PM

## 2012-04-04 NOTE — H&P (Addendum)
[name] is an 78 y.o. female.   Patient was seen and examined on April 04, 2012. PCP - Dr. Ruthe Mannan. Chief Complaint: Dizziness and weakness. HPI: 77 year-old female with history of CAD status post stenting, peripheral vascular disease status post stenting, COPD with ongoing tobacco abuse, presents with complaints of dizziness. Last evening at home by patient was with her son suddenly felt dizzy and weak. CT once laydown on the bed. EMS was called and was brought to the ER. Patient was found to be hypotensive. Patient did not have any chest pain or shortness of breath nausea vomiting abdominal pain fever chills diarrhea at the time. Patient was nonfocal. Did not lose consciousness. Her labs were unremarkable. Patient was placed on 2 L normal saline bolus her blood pressure improved to the 90s systolic. At this time patient has been admitted for further management. When I was examining patient states she had a brief episode of chest pressure few minutes ago which had resolved. Patient's blood pressure is still in the 90s. Patient denies having taken any new medications.  Past Medical History  Diagnosis Date  . Coronary artery disease     acute MI 2003, nonstemi 2008  . MI (myocardial infarction) 2003  . PVD (peripheral vascular disease)   . History of atrial flutter 2009    ablation  . Tachy-brady syndrome   . Paroxysmal atrial fibrillation   . COPD (chronic obstructive pulmonary disease)   . Hyperlipidemia   . Diabetes mellitus   . Diastolic congestive heart failure   . Arthritis   . Chronic kidney disease   . Carotid artery disease   . SSS (sick sinus syndrome)     ablation 2009  . Pneumonia   . Anxiety     adjustment disorder  . Pacemaker     Past Surgical History  Procedure Date  . Cardiac catheterization     2003 cardiac stent  . Insert / replace / remove pacemaker 2009    [employer]  . Cholecystectomy   . Total knee arthroplasty     bilateral  . Bilateral  hip arthroscopy   . 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 Myocardial perfusion study     EF 59% 4/04  . Adenosine myoview study     abn EF 53% 12/04/04  . Carotid doppler 2/99  . Echo with lvh 1/98    old records  . Stress cardiolite 3/04    EF 53%  . Gamma knife radiosurg 08/25/06    [facility]  . Pvd     with stent in the right iliac   . Colonoscopy 01/27/2012    Procedure: COLONOSCOPY;  Surgeon: Louis Meckel, MD;  Location: [facility] ENDOSCOPY;  Service: Endoscopy;  Laterality: N/A;    Family History  Problem Relation Age of Onset  . Cancer Neg Hx   . Depression      ?  . Diabetes      DM and HBP- family hx   Social History:  reports that she has been smoking Cigarettes.  She has a 10 pack-year smoking history. She has never used smokeless tobacco. She reports that she does not drink alcohol or use illicit drugs.  Allergies:  Allergies  Allergen Reactions  . Naproxen     REACTION: gastritis    Medications Prior to Admission  Medication Sig Dispense Refill  . acetaminophen (TYLENOL EX ST ARTHRITIS PAIN) 500 MG tablet Take 1-2 tablets (500-1,000  mg total) by mouth every 6 (six) hours as needed for pain.      [name] aspirin 81 MG tablet Take 81 mg by mouth daily.      [name] atorvastatin (LIPITOR) 40 MG tablet Take 1 tablet (40 mg total) by mouth daily.  30 tablet  3  . carvedilol (COREG) 6.25 MG tablet Take 1 tablet (6.25 mg total) by mouth 2 (two) times daily with a meal.  60 tablet  1  . cloNIDine (CATAPRES) 0.2 MG tablet Take 1 tablet (0.2 mg total) by mouth 3 (three) times daily.  90 tablet  5  . digoxin (LANOXIN) 0.125 MG tablet Take 1 tablet (125 mcg total) by mouth daily.  30 tablet  6  . docusate sodium (COLACE) 100 MG capsule Take 1 capsule (100 mg total) by mouth 2 (two) times daily.  60 capsule  4  . furosemide (LASIX) 20 MG tablet Take 1 tablet (20 mg total) by mouth daily.  90 tablet  3  . hydrALAZINE (APRESOLINE) 25 MG tablet Take 1  tablet (25 mg total) by mouth 3 (three) times daily.  90 tablet  3  . metFORMIN (GLUMETZA) 500 MG (MOD) 24 hr tablet Take 1 tablet (500 mg total) by mouth 2 (two) times daily with a meal.  60 tablet  6  . mirtazapine (REMERON) 30 MG tablet Take 1 tablet (30 mg total) by mouth at bedtime.  30 tablet  11  . Multiple Vitamin (MULTIVITAMIN WITH MINERALS) TABS Take 1 tablet by mouth daily.      . nitroGLYCERIN (NITROSTAT) 0.4 MG SL tablet Place 0.4 mg under the tongue every 5 (five) minutes as needed. For chest pain      . ondansetron (ZOFRAN) 4 MG tablet Take 4 mg by mouth every 8 (eight) hours as needed. For nausea        Results for orders placed during the hospital encounter of 04/03/12 (from the past 48 hour(s))  CBC WITH DIFFERENTIAL     Status: Normal   Collection Time   04/03/12  9:12 PM      Component Value Range Comment   WBC 7.9  4.0 - 10.5 K/uL    RBC 4.08  3.87 - 5.11 MIL/uL    Hemoglobin 13.1  12.0 - 15.0 g/dL    HCT 16.1  09.6 - 04.5 %    MCV 92.6  78.0 - 100.0 fL    MCH 32.1  26.0 - 34.0 pg    MCHC 34.7  30.0 - 36.0 g/dL    RDW 40.9  81.1 - 91.4 %    Platelets 305  150 - 400 K/uL    Neutrophils Relative 65  43 - 77 %    Neutro Abs 5.1  1.7 - 7.7 K/uL    Lymphocytes Relative 24  12 - 46 %    Lymphs Abs 1.9  0.7 - 4.0 K/uL    Monocytes Relative 10  3 - 12 %    Monocytes Absolute 0.8  0.1 - 1.0 K/uL    Eosinophils Relative 1  0 - 5 %    Eosinophils Absolute 0.1  0.0 - 0.7 K/uL    Basophils Relative 0  0 - 1 %    Basophils Absolute 0.0  0.0 - 0.1 K/uL   COMPREHENSIVE METABOLIC PANEL     Status: Abnormal   Collection Time   04/03/12  9:12 PM      Component Value Range Comment   Sodium 139  135 -  145 mEq/L    Potassium 4.2  3.5 - 5.1 mEq/L    Chloride 99  96 - 112 mEq/L    CO2 26  19 - 32 mEq/L    Glucose, Bld 93  70 - 99 mg/dL    BUN 27 (*) 6 - 23 mg/dL    Creatinine, Ser 1.61 (*) 0.50 - 1.10 mg/dL    Calcium 8.4  8.4 - 09.6 mg/dL    Total Protein 7.0  6.0 - 8.3 g/dL     Albumin 3.8  3.5 - 5.2 g/dL    AST 20  0 - 37 U/L    ALT 8  0 - 35 U/L    Alkaline Phosphatase 87  39 - 117 U/L    Total Bilirubin 0.2 (*) 0.3 - 1.2 mg/dL    GFR calc non Af Amer 28 (*) >90 mL/min    GFR calc Af Amer 33 (*) >90 mL/min   TROPONIN I     Status: Normal   Collection Time   04/03/12  9:13 PM      Component Value Range Comment   Troponin I <0.30  <0.30 ng/mL   URINALYSIS, ROUTINE W REFLEX MICROSCOPIC     Status: Normal   Collection Time   04/03/12  9:14 PM      Component Value Range Comment   Color, Urine YELLOW  YELLOW    APPearance CLEAR  CLEAR    Specific Gravity, Urine 1.008  1.005 - 1.030    pH 5.0  5.0 - 8.0    Glucose, UA NEGATIVE  NEGATIVE mg/dL    Hgb urine dipstick NEGATIVE  NEGATIVE    Bilirubin Urine NEGATIVE  NEGATIVE    Ketones, ur NEGATIVE  NEGATIVE mg/dL    Protein, ur NEGATIVE  NEGATIVE mg/dL    Urobilinogen, UA 0.2  0.0 - 1.0 mg/dL    Nitrite NEGATIVE  NEGATIVE    Leukocytes, UA NEGATIVE  NEGATIVE MICROSCOPIC NOT DONE ON URINES WITH NEGATIVE PROTEIN, BLOOD, LEUKOCYTES, NITRITE, OR GLUCOSE <1000 mg/dL.  LACTIC ACID, PLASMA     Status: Normal   Collection Time   04/03/12 11:07 PM      Component Value Range Comment   Lactic Acid, Venous 1.1  0.5 - 2.2 mmol/L   DIGOXIN LEVEL     Status: Normal   Collection Time   04/03/12 11:07 PM      Component Value Range Comment   Digoxin Level 1.8  0.8 - 2.0 ng/mL   GLUCOSE, CAPILLARY     Status: Normal   Collection Time   04/04/12 12:52 AM      Component Value Range Comment   Glucose-Capillary 95  70 - 99 mg/dL    Dg Chest Portable 1 View  04/04/2012  *RADIOLOGY REPORT*  Clinical Data: Dizziness, weakness.  PORTABLE CHEST - 1 VIEW  Comparison: 02/04/2012  Findings: Left chest wall battery pack with lead tips project over the right atrium and right ventricle, similar to prior. Heart size upper normal to mildly enlarged. Aortic atherosclerosis. Hyperinflation with mild lung base opacities.  No pleural  effusion or pneumothorax.  Osteopenia. Mild thoracolumbar curvature.  IMPRESSION: Hyperinflation with mild lung base opacities, likely atelectasis.   Original Report Authenticated By: Jearld Lesch, M.D.     Review of Systems  HENT: Negative.   Eyes: Negative.   Respiratory: Negative.   Cardiovascular: Negative.   Gastrointestinal: Negative.   Genitourinary: Negative.   Musculoskeletal: Negative.   Skin: Negative.   Neurological: Positive  for dizziness and weakness.  Endo/Heme/Allergies: Negative.   Psychiatric/Behavioral: Negative.     Blood pressure 102/62, pulse 104, temperature 97.9 F (36.6 C), temperature source Oral, resp. rate 19, height 5\' 5"  (1.651 m), weight 60.7 kg (133 lb 13.1 oz), SpO2 98.00%. Physical Exam  Constitutional: She is oriented to person, place, and time. She appears well-developed and well-nourished. No distress.  HENT:  Head: Normocephalic and atraumatic.  Right Ear: External ear normal.  Left Ear: External ear normal.  Nose: Nose normal.  Mouth/Throat: Oropharynx is clear and moist. No oropharyngeal exudate.  Eyes: Conjunctivae normal are normal. Pupils are equal, round, and reactive to light. Right eye exhibits no discharge. Left eye exhibits no discharge. No scleral icterus.  Neck: Normal range of motion. Neck supple.  Cardiovascular: Normal rate and regular rhythm.   Respiratory: Effort normal and breath sounds normal. No respiratory distress. She has no wheezes. She has no rales.  GI: Soft. Bowel sounds are normal. She exhibits no distension. There is no tenderness. There is no rebound and no guarding.  Musculoskeletal: She exhibits no edema and no tenderness.  Neurological: She is alert and oriented to person, place, and time.       Moves all extremities.  Skin: Skin is warm and dry. She is not diaphoretic.     Assessment/Plan #1. Hypotension - cause is not clear. Patient does not look septic. Continue to hydrate with fluids and we will hold  off patient's antihypertensives for now. Lactic acid is normal. Cycle cardiac markers. Check stat cortisol levels. #2. CAD status post stenting - patient did experience chest pain earlier. We will cycle cardiac markers continue with aspirin. #3. History of atrial fibrillation and flutter status post ablation and pacemaker placement - presently rate controlled. Not on anticoagulation as per cardiology. #4. History of hypertension presently hypotensive - see #1. #5. Peripheral vascular disease status post stent placement - no acute issues. #6. COPD with ongoing tobacco abuse - presently not wheezing. Advised to quit smoking.  Patient does have history of diastolic CHF but since patient is hypotensive at this time patient's diuretics are on hold we will be hydrating.  Patient's charts mention diabetes mellitus but patient on no diabetic medications.  Patient will be observed in step down for now.  CODE STATUS - full code.  Javarion Douty N. 04/04/2012, 2:37 AM

## 2012-04-04 NOTE — Progress Notes (Signed)
CRITICAL VALUE ALERT  Critical value received:  Troponin 0.32  Date of notification:  04/04/12  Time of notification:  0355  Critical value read back:yes  Nurse who received alert:  D. Madelin Rear, RN  MD notified (1st page):  Donnamarie Poag, NP  Time of first page:  [phone]  MD notified (2nd page):  Time of second page:  Responding MD:  Donnamarie Poag, NP  Time MD responded:  0600  No new orders received.

## 2012-04-04 NOTE — Progress Notes (Signed)
Report called to Maralyn Sago, receiving RN on  3 [address]. VSS. Transferred to 3 west room 41 via wheelchair with personal belongings. Called patient's family member with new room number.Fulton Mole

## 2012-04-05 ENCOUNTER — Inpatient Hospital Stay (HOSPITAL_COMMUNITY): Payer: Medicare Other

## 2012-04-05 ENCOUNTER — Encounter (HOSPITAL_COMMUNITY): Payer: Self-pay | Admitting: Physician Assistant

## 2012-04-05 DIAGNOSIS — I10 Essential (primary) hypertension: Secondary | ICD-10-CM

## 2012-04-05 DIAGNOSIS — I959 Hypotension, unspecified: Secondary | ICD-10-CM

## 2012-04-05 DIAGNOSIS — R42 Dizziness and giddiness: Principal | ICD-10-CM

## 2012-04-05 LAB — GLUCOSE, CAPILLARY
Glucose-Capillary: 108 mg/dL — ABNORMAL HIGH (ref 70–99)
Glucose-Capillary: 158 mg/dL — ABNORMAL HIGH (ref 70–99)
Glucose-Capillary: 173 mg/dL — ABNORMAL HIGH (ref 70–99)

## 2012-04-05 LAB — PRO B NATRIURETIC PEPTIDE: Pro B Natriuretic peptide (BNP): 9560 pg/mL — ABNORMAL HIGH (ref 0–450)

## 2012-04-05 MED ORDER — CLONIDINE HCL 0.1 MG PO TABS
0.1000 mg | ORAL_TABLET | Freq: Three times a day (TID) | ORAL | Status: DC
  Administered 2012-04-05 – 2012-04-07 (×6): 0.1 mg via ORAL
  Filled 2012-04-05 (×8): qty 1

## 2012-04-05 MED ORDER — HYDRALAZINE HCL 25 MG PO TABS
25.0000 mg | ORAL_TABLET | Freq: Three times a day (TID) | ORAL | Status: DC
  Administered 2012-04-05 – 2012-04-06 (×6): 25 mg via ORAL
  Filled 2012-04-05 (×9): qty 1

## 2012-04-05 MED ORDER — CLONIDINE HCL 0.2 MG PO TABS
0.2000 mg | ORAL_TABLET | Freq: Three times a day (TID) | ORAL | Status: DC
  Administered 2012-04-05: 0.2 mg via ORAL
  Filled 2012-04-05 (×3): qty 1

## 2012-04-05 MED ORDER — DIPHENHYDRAMINE HCL 12.5 MG/5ML PO ELIX
12.5000 mg | ORAL_SOLUTION | Freq: Once | ORAL | Status: AC
  Administered 2012-04-05: 12.5 mg via ORAL
  Filled 2012-04-05: qty 5

## 2012-04-05 MED ORDER — LEVALBUTEROL HCL 0.63 MG/3ML IN NEBU
0.6300 mg | INHALATION_SOLUTION | Freq: Three times a day (TID) | RESPIRATORY_TRACT | Status: DC
  Administered 2012-04-05 (×2): 0.63 mg via RESPIRATORY_TRACT
  Filled 2012-04-05 (×6): qty 3

## 2012-04-05 NOTE — Progress Notes (Signed)
Patient seen and examined. Agree with note by Algis Downs, PA. Patient was admitted yesterday with complaints of weakness and dizziness. She was thought to be volume depleted on admission. Her dizziness has resolved with IVF. She also had some CP on admission. With her elevated troponins we have ordered EKG, ECHO. Think it is prudent to consult cards given her history of CAD with stents. It is possible that her troponins could be related to her CKD. She has different BPs on her arms, so arterial duplex scans have been ordered to check for subclavian stenosis. Will continue to follow.  Peggye Pitt, MD [facility] Pager: [phone]

## 2012-04-05 NOTE — Progress Notes (Signed)
[facility] Progress Note    [name] WUJ:[id number] DOB: Oct 11, 1923 DOA: 04/03/2012 PCP: Ruthe Mannan, MD  Brief narrative: 78 year old female patient with history of known CAD and stents as well as significant peripheral vascular disease. She was brought to the emergency department because of complaints of dizziness and weakness. It is noted that her Lasix was resumed in the beginning of October. She was brought to the emergency department via EMS and was found to be hypotensive. No other associated symptoms. She did not lose consciousness. Her labs were unremarkable. She was given 2 L of normal saline and her blood pressure include to 90 systolic range. During the admitting physician's examination the patient endorsed having some brief chest discomfort. She was subsequently admitted to the step down unit for further monitoring and treatment.  Assessment/Plan: Principal Problem:  Elevated Troponin CAD-s/p stents Transient chest pain in the ER  Will check 12 lead EKG stat Check Echo.   Mild bump in troponin may be related to altered perfusion, or possibly related to elevation in creatinine Cards Consult (She is a patient of Dr. Ladona Ridgel)  Hypotension/?? Subclavian artery stenosis, right Patient has chronically low BP in Right arm.  Accurate BP obtained in left arm. Doubt accuracy of initial low blood pressure readings so we'll pursue a workup for possible subclavian stenosis by ordering arterial duplex studies  HYPERTENSION, BENIGN ESSENTIAL Now that blood pressures been clarified we'll resume all home antihypertensive medication  PVD (peripheral vascular disease)/Carotid stenosis As above regarding subclavian stenosis risk factor  Dehydration Patient may truly have been volume depleted from outpatient addition of diuretics but since we do not know which arm blood pressures were taken in we cannot truly determine if her dizziness was related to volume depletion or due to  elevated blood pressures Since she is normotensive and making adequate urinary output and renal function is improving we will place IV fluids at keep open BUN has decreased with rehydration and baseline BUN around 18 to 20  Acute on CKD (chronic kidney disease) stage 3, GFR 30-59 ml/min Likely related to volume depletion from diuretics so these remain on hold  Chronic diastolic heart failure Lasix has been discontinued.  On Carvedilol.  Breath sounds are course. Update Echocardiogram ordered Will check BNP  (11/20) Previous CXR clear Cards consulting. Would appreciate their input on diuretic use. Base line creatinine appears to be 1.2   Atrial fibrillation/ Cardiac pacemaker in situ Currently rate controlled with 100 percent paced rhythm  SMA stenosis-moderate No current complaints of abdominal pain  history of PITUITARY ADENOMA- s/p gamma knife Remote issue  Adjustment disorder with anxiety Patient expresses depression.  I'm Depressed, I just can't do what I want to do. No on and SSRIs.   Refer for treatment outpatient.  COPD Breath sounds coarse, but no wheeze.  Does not have home medications for this. Recheck CXR.   Add Nebs.  Disposition PT/OT Lives at home with Son.  Family is considering placement and will make a decision today.    DVT prophylaxis: SCDs Code Status: Full Family Communication: Spoke with patient, and also talked with son on the phone.  Family is considering placement. Disposition Plan: Transfer to telemetry  Consultants: [facility]  Procedures: None  Antibiotics: None  HPI/Subjective: Reports that she is weak, cant walk well, and just cant do what she wants to do.  "Im depressed"   Objective: Blood pressure 112/71, pulse 109, temperature 98.4 F (36.9 C), temperature source Oral, resp. rate 18, height  5\' 5"  (1.651 m), weight 60.7 kg (133 lb 13.1 oz), SpO2 91.00%.  Intake/Output Summary (Last 24 hours) at 04/05/12 0910 Last  data filed at 04/04/12 2100  Gross per 24 hour  Intake     33 ml  Output      2 ml  Net     31 ml     Exam: General: Alert and oriented appears weak, no apparent distress. Head: Atraumatic normocephalic Neck: supple without adenopathy Chest: Nontender to palpation, regular rate and rhythm, systolic murmur noted. Respiratory: Coarse breath sounds throughout. No accessory muscle use. Abdomen: Soft nontender, nondistended, no masses. Extremities: No lower extremity edema. Psychiatric: Patient mood depressed.  Data Reviewed: Basic Metabolic Panel:  Lab 04/04/12 4782 04/03/12 2112  NA 141 139  K 3.4* 4.2  CL 104 99  CO2 26 26  GLUCOSE 122* 93  BUN 24* 27*  CREATININE 1.42* 1.60*  CALCIUM 7.4* 8.4  MG -- --  PHOS -- --   Liver Function Tests:  Lab 04/04/12 0149 04/03/12 2112  AST 16 20  ALT 7 8  ALKPHOS 74 87  BILITOT 0.2* 0.2*  PROT 6.0 7.0  ALBUMIN 3.2* 3.8   CBC:  Lab 04/04/12 0149 04/03/12 2112  WBC 7.1 7.9  NEUTROABS 4.2 5.1  HGB 11.5* 13.1  HCT 34.0* 37.8  MCV 93.2 92.6  PLT 285 305   Cardiac Enzymes:  Lab 04/04/12 1439 04/04/12 0945 04/04/12 0146 04/03/12 2113  CKTOTAL -- -- -- --  CKMB -- -- -- --  CKMBINDEX -- -- -- --  TROPONINI 0.46* 0.45* 0.32* <0.30   BNP (last 3 results)  Basename 05/31/11 1242  PROBNP 542.0*   CBG:  Lab 04/05/12 0730 04/04/12 2140 04/04/12 1607 04/04/12 1138 04/04/12 0745  GLUCAP 108* 123* 124* 116* 105*    Recent Results (from the past 240 hour(s))  MRSA PCR SCREENING     Status: Normal   Collection Time   04/04/12  2:00 AM      Component Value Range Status Comment   MRSA by PCR NEGATIVE  NEGATIVE Final      Studies:  Recent x-ray studies have been reviewed in detail by the Attending Physician  Scheduled Meds:  Reviewed in detail by the Attending Physician   Algis Downs, PA-C [facility] Pager: [phone]

## 2012-04-05 NOTE — Consult Note (Signed)
CARDIOLOGY CONSULT NOTE  Patient ID: [name], MRN: [id number], DOB/AGE: [birth year] 78 y.o. Admit date: 04/03/2012   Date of Consult: 04/05/2012 Primary Physician: Virginia Mannan, MD Primary Cardiologist: Dr. Mariah Rice  Chief Complaint: dizziness, weakness Reason for Consult: elevated troponin  HPI: [name] is an 78 y/o F with hx of CAD s/p LCx stent 2003, atrial fib/atrial flutter (not on Coumadin due to hemarthrosis/debilitation), PVD, DM, COPD, chronic diastolic CHF, SSS s/p pacemaker who presented to [facility] with dizziness. She had not quite felt well 2 days prior to admission. She would get intermittently dizzy and lightheaded. She had an episode of near-syncope associated with nausea, visual darkening, and chest pain with radiation down her left arm prompting her to come to the ER. In the ED, she was found to be hypotensive at 72/57. Her BP improved to the 90s with IVF. She has been found to have BP differential (R arm giving lower readings than L arm). Has h/o BP differential with CT angio at [facility] 02/2011 demonstrating moderate prox L subclavian stenosis, high-grade circumferential stenosis at origin/proximal portion of innominate artery. CXR nonacute. BUN/Cr 27/1.6. Troponin 0.30->0.32->0.45->0.46. Digoxin level WNL (1.8). pBNP 9560. Hgb 13.1->11.5 after hydration (latter was closer to prior values). Her Lasix has been held. She has not had any recent chest pain aside from with her presyncope, but generally feels weak. No SOB, LEE, orthopnea, PND, palpitations.  Past Medical History  Diagnosis Date  . Coronary artery disease     a. Acute MI 2003, stent to LCx. b. NSTEMI 2008 (cath w/o PCI).  c. +trop 02/2011 felt 2/2 demand ischemia.  [name] History of atrial flutter 2009    a. s/p ablation of typical flutter 09/2007.  [name] Paroxysmal atrial fibrillation     a. Dx 2009. b. Discontinuation of Coumadin 01/2008 2/2 hemarthrosis/chronic debilitation.  [name] COPD (chronic obstructive  pulmonary disease)   . Hyperlipidemia   . Diabetes mellitus   . Diastolic congestive heart failure   . Arthritis   . Chronic kidney disease   . Carotid artery disease     a. Bilateral  . SSS (sick sinus syndrome)     a. Symptomatic bradycardia/syncope and post-termination pauses - St. Jude dual chamber PPM 09/2007.   [name] Pneumonia   . Anxiety     adjustment disorder  . Pacemaker   . Mesenteric ischemia     a. Possible dx, 2013.  . Diverticula of colon     a. Colonoscopy 01/2012.  [name] PVD (peripheral vascular disease)     Carotid dz as noted, repoted aortoiliac occlusive dz, also reported high-grade stenosis at the origin & prox innominate artery, high-grade stenosis of the right carotid bifurcation, moderate stenosis of the proximal prevertebral left subclavian artery by CT  . Pituitary tumor     H/o per chart with surveillance at [facility] Recent Cardiac Studies: Echo Per [facility] D/C summary 02/2011 EF 55%, LVH was noted, LA was normal, TR was noted.  Cardiac Cath 2008  RESULTS:  1. Hemodynamics: LV 213/15, AO 213/67.  2. Coronaries: The left main was short with a distal 40% stenosis.  The LAD was large wrapping the apex. There was proximal 25%  stenosis. First diagonal was moderate size and normal. Circumflex  and the AV groove had long calcified lesion. There was 30%  stenosis. There is a mid stent which was widely patent. The mid  obtuse marginal was large with ostial 25% stenosis. Second obtuse  marginal was moderate sized and free of high-grade disease. The  right coronary artery was a moderate to small vessel. There was  ostial 99% stenosis followed by severe diffuse mid disease. There  was TIMI I flow of the left-to-right collaterals. This was  unchanged from the catheterization in 2003. Aortogram was obtained  demonstrating patent renal's with a right ostial 50% lesion. There  were diffuse nonobstructive irregularities in the aorta before the  bifurcation. The  right iliac had a patent stent.  3. Left ventriculogram: Left ventriculogram was obtained in the RAO  projection. The EF was 65% with normal wall motion.  CONCLUSIONS:  1. Patent circumflex stent.  2. Nonobstructive disease elsewhere in the left system.  3. High-grade disease and a small diffusely diseased right coronary  system unchanged from 2003.  4. Well-preserved ejection fraction.  5. Moderate aortic disease with a with patent iliac stent.  PLAN: Patient with continue to have aggressive secondary risk  reduction. She was quite hypertensive during the case. We will make  sure to pull the sheath only after her pressure has returned to  baseline.    Surgical History:  Past Surgical History  Procedure Date  . Cardiac catheterization     2003 cardiac stent  . Insert / replace / remove pacemaker 2009    St. Jude  . Cholecystectomy   . Total knee arthroplasty     bilateral  . Bilateral hip arthroscopy   . 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 Myocardial perfusion study     EF 59% 4/04  . Adenosine myoview study     abn EF 53% 12/04/04  . Carotid doppler 2/99  . Echo with lvh 1/98    old records  . Stress cardiolite 3/04    EF 53%  . Gamma knife radiosurg 08/25/06    [facility] B<[facility]  . Pvd     with stent in the right iliac   . Colonoscopy 01/27/2012    Procedure: COLONOSCOPY;  Surgeon: Virginia Meckel, MD;  Location: [facility] ENDOSCOPY;  Service: Endoscopy;  Laterality: N/A;     Home Meds: Prior to Admission medications   Medication Sig Start Date End Date Taking? Authorizing Provider  acetaminophen (TYLENOL EX ST ARTHRITIS PAIN) 500 MG tablet Take 1-2 tablets (500-1,000 mg total) by mouth every 6 (six) hours as needed for pain. 02/07/12  Yes Virginia Llano, MD  aspirin 81 MG tablet Take 81 mg by mouth daily.   Yes Historical Provider, MD  atorvastatin (LIPITOR) 40 MG tablet Take 1 tablet (40 mg total) by mouth daily. 02/15/12  Yes Virginia Dun,  MD  carvedilol (COREG) 6.25 MG tablet Take 1 tablet (6.25 mg total) by mouth 2 (two) times daily with a meal. 01/27/12 01/26/13 Yes Estela Isaiah Blakes, MD  cloNIDine (CATAPRES) 0.2 MG tablet Take 1 tablet (0.2 mg total) by mouth 3 (three) times daily. 03/30/12 03/30/13 Yes Virginia Dun, MD  digoxin (LANOXIN) 0.125 MG tablet Take 1 tablet (125 mcg total) by mouth daily. 01/03/12  Yes Virginia Dun, MD  docusate sodium (COLACE) 100 MG capsule Take 1 capsule (100 mg total) by mouth 2 (two) times daily. 10/13/11  Yes Virginia Dun, MD  furosemide (LASIX) 20 MG tablet Take 1 tablet (20 mg total) by mouth daily. 02/18/12  Yes Antonieta Iba, MD  hydrALAZINE (APRESOLINE) 25 MG tablet Take 1 tablet (25 mg total) by mouth 3 (three) times daily. 02/14/12  Yes  Virginia Dun, MD  metFORMIN (GLUMETZA) 500 MG (MOD) 24 hr tablet Take 1 tablet (500 mg total) by mouth 2 (two) times daily with a meal. 07/19/11  Yes Virginia Dun, MD  mirtazapine (REMERON) 30 MG tablet Take 1 tablet (30 mg total) by mouth at bedtime. 11/09/11  Yes Virginia Dun, MD  Multiple Vitamin (MULTIVITAMIN WITH MINERALS) TABS Take 1 tablet by mouth daily. 01/27/12  Yes Estela Isaiah Blakes, MD  nitroGLYCERIN (NITROSTAT) 0.4 MG SL tablet Place 0.4 mg under the tongue every 5 (five) minutes as needed. For chest pain   Yes Historical Provider, MD  ondansetron (ZOFRAN) 4 MG tablet Take 4 mg by mouth every 8 (eight) hours as needed. For nausea   Yes Historical Provider, MD    Inpatient Medications:     . aspirin EC  81 mg Oral Daily  . atorvastatin  40 mg Oral Daily  . carvedilol  6.25 mg Oral BID WC  . cloNIDine  0.2 mg Oral TID  . digoxin  125 mcg Oral Daily  . docusate sodium  100 mg Oral BID  . feeding supplement  237 mL Oral TID BM  . hydrALAZINE  25 mg Oral TID  . insulin aspart  0-9 Units Subcutaneous TID WC  . levalbuterol  0.63 mg Nebulization Q8H  . [DISCONTINUED] cloNIDine  0.2 mg Oral TID  . [DISCONTINUED] hydrALAZINE  25 mg  Oral TID    Allergies:  Allergies  Allergen Reactions  . Naproxen     REACTION: gastritis    History   Social History  . Marital Status: Widowed    Spouse Name: N/A    Number of Children: N/A  . Years of Education: N/A   Occupational History  . Not on file.   Social History Main Topics  . Smoking status: Current Every Day Smoker -- 0.2 packs/day for 40 years    Types: Cigarettes    Last Attempt to Quit: 04/02/2012  . Smokeless tobacco: Never Used  . Alcohol Use: No  . Drug Use: No  . Sexually Active:    Other Topics Concern  . Not on file   Social History Narrative   Widowed, husband died at age 68-DM, HTN. 3 children. Nursing assistant in past, active in church. Son lives with her and does most of the housework and cooking. 1/10Pt signed party release form and gives Zane Herald, granddaughter [phone]), access to medical records. Can leave msg on machine [phone]) or cell.      Family History  Problem Relation Age of Onset  . Cancer Neg Hx   . Depression      ?  . Diabetes      DM and HBP- family hx     Review of Systems: General: negative for chills, fever, night sweats or weight changes.  Cardiovascular: negative for chest pain, edema, orthopnea, palpitations, paroxysmal nocturnal dyspnea, shortness of breath or dyspnea on exertion Dermatological: negative for rash Respiratory: chronic wheezing Urologic: negative for hematuria Abdominal: negative for nausea, vomiting, bright red blood per rectum, melena, or hematemesis. Loose stools yesterday but only 1 BM Neurologic: see above All other systems reviewed and are otherwise negative except as noted above.  Labs:  [facility] 04/04/12 1439 04/04/12 0945 04/04/12 0146 04/03/12 2113  CKTOTAL -- -- -- --  CKMB -- -- -- --  TROPONINI 0.46* 0.45* 0.32* <0.30   Lab Results  Component Value Date   WBC 7.1 04/04/2012   HGB 11.5* 04/04/2012  HCT 34.0* 04/04/2012   MCV 93.2 04/04/2012   PLT 285 04/04/2012       Lab 04/04/12 0149  NA 141  K 3.4*  CL 104  CO2 26  BUN 24*  CREATININE 1.42*  CALCIUM 7.4*  PROT 6.0  BILITOT 0.2*  ALKPHOS 74  ALT 7  AST 16  GLUCOSE 122*   Lab Results  Component Value Date   CHOL 131 12/24/2008   HDL 64.90 12/24/2008   LDLCALC 52 12/24/2008   TRIG 71.0 12/24/2008    Radiology/Studies:  Dg Chest 2 View 04/05/2012  *RADIOLOGY REPORT*  Clinical Data: Abnormal breath sounds on physical exam  CHEST - 2 VIEW  Comparison: 04/03/2012  Findings: The heart and pulmonary vascularity are stable.  A pacing device is again seen.  The lungs are clear bilaterally.  No acute bony abnormality is seen.  IMPRESSION: No acute abnormality noted.   Original Report Authenticated By: Alcide Clever, M.D.    Dg Chest Portable 1 View 04/04/2012  *RADIOLOGY REPORT*  Clinical Data: Dizziness, weakness.  PORTABLE CHEST - 1 VIEW  Comparison: 02/04/2012  Findings: Left chest wall battery pack with lead tips project over the right atrium and right ventricle, similar to prior. Heart size upper normal to mildly enlarged. Aortic atherosclerosis. Hyperinflation with mild lung base opacities.  No pleural effusion or pneumothorax.  Osteopenia. Mild thoracolumbar curvature.  IMPRESSION: Hyperinflation with mild lung base opacities, likely atelectasis.   Original Report Authenticated By: Jearld Lesch, M.D.    EKG: atrial paced 60bpm downsloping TWI II, III, avF, V3-V6 Different from 02/2012 but has had similar abnormalities 01/2012  Physical Exam: Blood pressure 127/64, pulse 59, temperature 98.4 F (36.9 C), temperature source Oral, resp. rate 18, height 5\' 5"  (1.651 m), weight 133 lb 13.1 oz (60.7 kg), SpO2 91.00%. General: Frail elderly AAF in NAD Head: Normocephalic, atraumatic, sclera non-icteric, no xanthomas, nares are without discharge.  Neck: JVD not elevated. Lungs: Coarse, rhonchorous throughout with occasional very faint exp wheeze. Breathing is unlabored. Heart: RRR with S1 S2. No  murmurs, rubs, or gallops appreciated. Abdomen: Soft, non-tender, non-distended with normoactive bowel sounds. No hepatomegaly. No rebound/guarding. No obvious abdominal masses. Msk:  Strength and tone appear normal for age. Extremities: No clubbing or cyanosis. No edema.  Diminished pedal pulses bilaterally. Neuro: Alert and oriented X 3. No facial asymmetry. No focal deficit. Moves all extremities spontaneously. Psych:  Responds to questions appropriately with a normal affect.    Assessment and Plan:   1. Dizziness 2. Hypotension with blood pressure differential R<L 3. PVD with known moderate L subclavian stenosis, high-grade circumferential stenosis at origin/proximal portion of innominate artery by CT 02/2011 3. Mildly positive troponin with history of CAD with remote LCx stent (NSTEMI 2008 - no PCI, demand ischemia 02/2011)  4. H/o atrial fib/flutter s/p flutter ablation, not previously felt to be a Coumadin candidate 5. SSS s/p St. Jude pacemaker 6. Acute renal insufficiency, improving 7. Chronic anemia 8. COPD  At this time, her dizziness is felt precipitated by volume depletion. Blood pressures are unfortunately inconsistent given innominate artery (R) and subclavian artery (L) stenosis, but elevated BUN/Cr/Hgb compared to baseline give some clue to intravascular depletion. Agree with holding Lasix. May need to resume at lower dosing such as every other day. Per discussion with Dr. Patty Sermons, will also decrease clonidine to 0.1mg  TID. May need to run BP a little higher. She is not an ideal candidate for cath with her positive troponins given advanced age, demobility (  has previously not been felt to be a Coumadin candidate for these reasons). She is CP free now. Would manage conservatively with ASA, BB, statin. Await echo results. Interrogate pacemaker to ensure normal function. COPD rx per IM.  Signed, Ronie Spies PA-C 04/05/2012, 11:56 AM  The patient was seen with Ronie Spies PA-C.   Her examination by me was somewhat abbreviated because she openly expressed her feeling that she had had too many doctors checking her today.  Briefly on examination she does have coarse rhonchi bilaterally.  There is an audible systolic bruit above and below the right clavicle consistent with her known severe innominate stenosis.  Her pedal pulses appear to be diminished.  I agree with the assessment and plan as noted above.  Her initial presentation appeared to be secondary to volume depletion and her diuretic dose going forward will need to be lessened.  We will also cut back on her clonidine to avoid wide swings in her blood pressure.  Will await results of echo and pacemaker interrogation.  We do not anticipate the need for any invasive cardiac studies.

## 2012-04-06 DIAGNOSIS — I059 Rheumatic mitral valve disease, unspecified: Secondary | ICD-10-CM

## 2012-04-06 DIAGNOSIS — Z950 Presence of cardiac pacemaker: Secondary | ICD-10-CM

## 2012-04-06 DIAGNOSIS — I779 Disorder of arteries and arterioles, unspecified: Secondary | ICD-10-CM

## 2012-04-06 DIAGNOSIS — I5032 Chronic diastolic (congestive) heart failure: Secondary | ICD-10-CM

## 2012-04-06 LAB — BASIC METABOLIC PANEL
BUN: 18 mg/dL (ref 6–23)
Calcium: 8.3 mg/dL — ABNORMAL LOW (ref 8.4–10.5)
Chloride: 104 mEq/L (ref 96–112)
Creatinine, Ser: 1.31 mg/dL — ABNORMAL HIGH (ref 0.50–1.10)
GFR calc Af Amer: 41 mL/min — ABNORMAL LOW (ref >90)
GFR calc non Af Amer: 43 mL/min — ABNORMAL LOW (ref >90)
Glucose, Bld: 98 mg/dL (ref 70–99)
Sodium: 140 mEq/L (ref 135–145)

## 2012-04-06 LAB — GLUCOSE, CAPILLARY

## 2012-04-06 MED ORDER — POTASSIUM CHLORIDE CRYS ER 20 MEQ PO TBCR
40.0000 meq | EXTENDED_RELEASE_TABLET | Freq: Two times a day (BID) | ORAL | Status: DC
  Administered 2012-04-06 (×2): 40 meq via ORAL
  Filled 2012-04-06 (×4): qty 2

## 2012-04-06 MED ORDER — FUROSEMIDE 20 MG PO TABS
20.0000 mg | ORAL_TABLET | Freq: Every day | ORAL | Status: DC
  Administered 2012-04-06 – 2012-04-07 (×2): 20 mg via ORAL
  Filled 2012-04-06 (×2): qty 1

## 2012-04-06 MED ORDER — FUROSEMIDE 10 MG/ML IJ SOLN
20.0000 mg | Freq: Once | INTRAMUSCULAR | Status: DC
  Filled 2012-04-06: qty 2

## 2012-04-06 MED ORDER — DIPHENHYDRAMINE HCL 12.5 MG/5ML PO ELIX
12.5000 mg | ORAL_SOLUTION | Freq: Once | ORAL | Status: AC
  Administered 2012-04-07: 12.5 mg via ORAL
  Filled 2012-04-06: qty 5

## 2012-04-06 MED ORDER — LEVALBUTEROL TARTRATE 45 MCG/ACT IN AERO
2.0000 | INHALATION_SPRAY | Freq: Three times a day (TID) | RESPIRATORY_TRACT | Status: DC
  Administered 2012-04-06: 2 via RESPIRATORY_TRACT
  Filled 2012-04-06: qty 15

## 2012-04-06 NOTE — Progress Notes (Addendum)
   CARE MANAGEMENT NOTE 04/06/2012  Patient:  [name], [name]   Account Number:  [id number]  Date Initiated:  04/06/2012  Documentation initiated by:  Tanelle Lanzo  Subjective/Objective Assessment:   CM consult, pt most likely will d/c to SNF.     Action/Plan:   CM will follow for progression and assist with d/c needs if pt decides to d/c to home.  Referral placed for CSW to assist with SNF placement if pt agrees.  Anticipated DC Date:  04/09/2012   Anticipated DC Plan:  SKILLED NURSING FACILITY         Choice offered to / List presented to:             Status of service:  In process, will continue to follow Medicare Important Message given?   (If response is "NO", the following Medicare IM given date fields will be blank) Date Medicare IM given:   Date Additional Medicare IM given:    Discharge Disposition:  SKILLED NURSING FACILITY  Per UR Regulation:    If discussed at Long Length of Stay Meetings, dates discussed:    Comments:   04/06/2012 Referral to CSW for SNF placement.  Johny Shock RN MPH

## 2012-04-06 NOTE — Progress Notes (Signed)
Patient seen and examined. Agree with note by Algis Downs, PA. Appreciate cardiology assistance with her a fib and CHF. Plan at this point is DC to SNF hopefully in am, as long as cards is not planning on doing any further workup.  Peggye Pitt, MD [facility] Pager: [phone]

## 2012-04-06 NOTE — Progress Notes (Signed)
Patient Name: [name] Date of Encounter: 04/06/2012    Principal Problem:  *Hypotension Active Problems:  history of PITUITARY ADENOMA- s/p gamma knife  Adjustment disorder with anxiety  HYPERTENSION, BENIGN ESSENTIAL  CAD, NATIVE VESSEL-s/p stents  Atrial fibrillation  Chronic diastolic heart failure  COPD  Cardiac pacemaker in situ  PVD (peripheral vascular disease)  CKD (chronic kidney disease) stage 3, GFR 30-59 ml/min  Dehydration  SMA stenosis-moderate  Carotid stenosis  ?? Subclavian artery stenosis, right  Elevated troponin   SUBJECTIVE  "I don't feel well."  Cannot qualify further than that.  No pain, sob, nausea.  CURRENT MEDS    . aspirin EC  81 mg Oral Daily  . atorvastatin  40 mg Oral Daily  . carvedilol  6.25 mg Oral BID WC  . cloNIDine  0.1 mg Oral TID  . digoxin  125 mcg Oral Daily  . [COMPLETED] diphenhydrAMINE  12.5 mg Oral Once  . docusate sodium  100 mg Oral BID  . feeding supplement  237 mL Oral TID BM  . furosemide  20 mg Intravenous Once  . hydrALAZINE  25 mg Oral TID  . insulin aspart  0-9 Units Subcutaneous TID WC  . levalbuterol  0.63 mg Nebulization Q8H  . potassium chloride  40 mEq Oral BID  . [DISCONTINUED] cloNIDine  0.2 mg Oral TID    OBJECTIVE  Filed Vitals:   04/05/12 1702 04/05/12 2109 04/06/12 0600 04/06/12 0818  BP: 174/67 145/64 150/72 164/60  Pulse:  62 60 60  Temp:  98 F (36.7 C) 98.2 F (36.8 C)   TempSrc:  Oral Oral   Resp:  16 16   Height:      Weight:   143 lb 11.2 oz (65.182 kg)   SpO2:  98% 96%     Intake/Output Summary (Last 24 hours) at 04/06/12 0852 Last data filed at 04/05/12 1425  Gross per 24 hour  Intake    240 ml  Output      0 ml  Net    240 ml   Filed Weights   04/04/12 0125 04/06/12 0600  Weight: 133 lb 13.1 oz (60.7 kg) 143 lb 11.2 oz (65.182 kg)    PHYSICAL EXAM  General: Pleasant, NAD. Neuro: Alert and oriented X 3. Moves all extremities spontaneously. Psych: Flat  affect. HEENT:  Normal  Neck: Supple with bruit noted - R clavicular/carotid areas.  no JVD. Lungs:  Resp regular and unlabored, CTA. Heart: RRR no s3, s4, or murmurs. Abdomen: Soft, non-tender, non-distended, BS + x 4.  Extremities: No clubbing, cyanosis or edema. DP/PT/Radials 1+ and equal bilaterally.  Accessory Clinical Findings  CBC  Basename 04/04/12 0149 04/03/12 2112  WBC 7.1 7.9  NEUTROABS 4.2 5.1  HGB 11.5* 13.1  HCT 34.0* 37.8  MCV 93.2 92.6  PLT 285 305   Basic Metabolic Panel  Basename 04/06/12 0630 04/04/12 0149  NA 140 141  K 4.3 3.4*  CL 104 104  CO2 25 26  GLUCOSE 98 122*  BUN 19 24*  CREATININE 1.12* 1.42*  CALCIUM 8.3* 7.4*  MG -- --  PHOS -- --   Liver Function Tests  Basename 04/04/12 0149 04/03/12 2112  AST 16 20  ALT 7 8  ALKPHOS 74 87  BILITOT 0.2* 0.2*  PROT 6.0 7.0  ALBUMIN 3.2* 3.8   Cardiac Enzymes  Basename 04/04/12 1439 04/04/12 0945 04/04/12 0146  CKTOTAL -- -- --  CKMB -- -- --  CKMBINDEX -- -- --  TROPONINI 0.46* 0.45* 0.32*   TELE  AV paced, V paced in setting of PAF - rates up to 150's for brief periods.  Freq PAF over past 24 hrs.  Radiology/Studies  Dg Chest 2 View  04/05/2012  *RADIOLOGY REPORT*  Clinical Data: Abnormal breath sounds on physical exam  CHEST - 2 VIEW  Comparison: 04/03/2012  Findings: The heart and pulmonary vascularity are stable.  A pacing device is again seen.  The lungs are clear bilaterally.  No acute bony abnormality is seen.  IMPRESSION: No acute abnormality noted.   Original Report Authenticated By: Alcide Clever, M.D.    ASSESSMENT AND PLAN  1.  Hypotension:  Pressures improved overnight.  Looks like she received a dose of IV lasix earlier this AM.  No evidence of volume overload at this time.  Creat improved.  Resume home dose of PO lasix.  2.  Troponin Elevation/CAD:  No chest pain.  Trend relatively flat.  Echo pending.  Cont bb, statin, asa.  No current plans for further ischemic  evaluation.  3.  PAF:  Pt with runs of afib overnight.  Rates up to 150's for short periods.  Device check in June showed 1,737 mode switches - longest > 7 hrs, though overall this accounted for only 1% of her rhythm.  Frequent afib likely contributing to diastolic chf.  Will have device interrogated today to re-assess burden of afib.  We may need to consider antiarrhythmic therapy given prior hospitalizations for chf in setting of afib rvr.  She is not a coumadin candidate, having been taken off of it in 01/2008 2/2 bleeding.  4. Acute on chronic stage III kidney disease:  Creat improved.  According to prior outpt notes, she is very lasix sensitive with a seemingly narrow euvolemic window.  Dr. Windell Hummingbird last note from 10/4 states that we will likely have to run her creat slightly higher to avoid chf exacerbations.  5.  Chronic diastolic chf:  Volume currently stable.  ? Role of afib.  Resume home dose of lasix.  6.  PVD:  Conservative mgmt.  Signed, Nicolasa Ducking NP Patient examined and agree with plan. Echo ordered. She will do better if we run her on dry side.  Valera Castle, MD 04/06/2012 10:12 AM

## 2012-04-06 NOTE — Progress Notes (Signed)
OT Cancellation Note  Patient Details Name: [name] MRN: [id number] DOB: [birth year]   Cancelled Treatment:    Reason Eval/Treat Not Completed: Pain limiting ability to participate;Other (comment) (Pt too sleepy did not want to get out of bed.)  Will re-attempt on 04/07/12.  [name] Pager number [phone]  04/06/2012, 1:36 PM

## 2012-04-06 NOTE — Progress Notes (Signed)
  Echocardiogram 2D Echocardiogram has been performed.  [name] FRANCES 04/06/2012, 6:18 PM

## 2012-04-06 NOTE — Evaluation (Signed)
Physical Therapy Evaluation Patient Details Name: [name] MRN: [id number] DOB: [birth year] Today's Date: 04/06/2012 Time: 6644-0347 PT Time Calculation (min): 33 min  PT Assessment / Plan / Recommendation Clinical Impression  pt admitted with weakness and dizziness.  She reports no energy.  Pt was dehydrated, but as yet not total certain of the present causes.  Pt can benefit from PT to improve tolerance to activity, strength and general balance.    PT Assessment  Patient needs continued PT services    Follow Up Recommendations  SNF;Other (comment) (would be beneficial.  Home if pt is adamant with up to 24 hr)    Does the patient have the potential to tolerate intense rehabilitation      Barriers to Discharge        Equipment Recommendations  None recommended by PT    Recommendations for Other Services     Frequency Min 3X/week    Precautions / Restrictions Precautions Precautions: Fall Restrictions Weight Bearing Restrictions: No   Pertinent Vitals/Pain Standing  BP 185/73  HR 60's, Return to sitting after time in standing 192/64      Mobility  Bed Mobility Bed Mobility: Supine to Sit;Sitting - Scoot to [place] of Bed;Sit to Supine Supine to Sit: 4: Min guard Sitting - Scoot to [place] of Bed: 5: Supervision Sit to Supine: 4: Min guard Details for Bed Mobility Assistance: mild struggle with rail getting in/out, but no assist needed Transfers Transfers: Sit to Stand;Stand to Sit Sit to Stand: 4: Min assist;With upper extremity assist;4: Min guard;From bed;From chair/3-in-1 Details for Transfer Assistance: vc's for hand placement; initially needed stability assist then guarding on subsequent stands Ambulation/Gait Ambulation/Gait Assistance: 4: Min assist Ambulation Distance (Feet): 30 Feet Assistive device: Rolling walker Ambulation/Gait Assistance Details: mildly unsteady and visibly fatigued during short walk Gait Pattern: Step-through pattern;Decreased step  length - right;Decreased step length - left;Decreased stride length Stairs: No    Shoulder Instructions     Exercises     PT Diagnosis: Generalized weakness;Other (comment) (decr activity tolerance)  PT Problem List: Decreased strength;Decreased activity tolerance;Decreased balance;Decreased mobility;Decreased knowledge of use of DME PT Treatment Interventions: DME instruction;Gait training;Functional mobility training;Therapeutic activities;Therapeutic exercise;Balance training;Patient/family education   PT Goals Acute Rehab PT Goals PT Goal Formulation: With patient Time For Goal Achievement: 04/06/12 Potential to Achieve Goals: Good Pt will go Supine/Side to Sit: with supervision PT Goal: Supine/Side to Sit - Progress: Goal set today Pt will go Sit to Stand: with supervision PT Goal: Sit to Stand - Progress: Goal set today Pt will Transfer Bed to Chair/Chair to Bed: with supervision PT Transfer Goal: Bed to Chair/Chair to Bed - Progress: Goal set today Pt will Ambulate: 51 - 150 feet;with supervision;with rolling walker PT Goal: Ambulate - Progress: Goal set today  Visit Information  Last PT Received On: 04/06/12 Assistance Needed: +1    Subjective Data  Subjective: I don't want to go to a nursing home... Patient Stated Goal: Get home and get more energy   Prior Functioning  Home Living Lives With: Other (Comment);Son Available Help at Discharge: Family;Other (Comment) (son) Type of Home: House Home Access: Ramped entrance Home Layout: One level Bathroom Shower/Tub: Other (comment) Bathroom Toilet: Standard Bathroom Accessibility: Yes How Accessible: Accessible via walker Home Adaptive Equipment: Bedside commode/3-in-1;Walker - four wheeled Prior Function Level of Independence: Needs assistance Able to Take Stairs?: No Driving: No Communication Communication: No difficulties Dominant Hand: Right    Cognition  Overall Cognitive Status: Appears within functional  limits for tasks assessed/performed Arousal/Alertness: Awake/alert Orientation Level: Appears intact for tasks assessed Behavior During Session: [facility] for tasks performed    Extremity/Trunk Assessment Right Lower Extremity Assessment RLE ROM/Strength/Tone: Within functional levels RLE Sensation: WFL - Light Touch Left Lower Extremity Assessment LLE ROM/Strength/Tone: WFL for tasks assessed;Deficits LLE ROM/Strength/Tone Deficits: grossly weaker at 4- to 4/5  and painful knees Trunk Assessment Trunk Assessment: Normal   Balance Balance Balance Assessed: Yes Static Sitting Balance Static Sitting - Balance Support: No upper extremity supported;Feet supported Static Sitting - Level of Assistance: 5: Stand by assistance Static Sitting - Comment/# of Minutes: 5 Static Standing Balance Static Standing - Balance Support: Bilateral upper extremity supported;During functional activity (taking standing BP's) Static Standing - Level of Assistance: 4: Min assist Static Standing - Comment/# of Minutes: 2  End of Session PT - End of Session Activity Tolerance: Patient limited by fatigue Patient left: in bed;with call bell/phone within reach Nurse Communication: Mobility status  GP     Nashae Maudlin, Eliseo Gum 04/06/2012, 10:52 AM  04/06/2012  [place] Bing, PT [phone] (pager)

## 2012-04-06 NOTE — Progress Notes (Signed)
Nutrition Follow-up  Intervention:    Continue Ensure Complete supplement 3 times daily between meals (350 kcals, 13 gm protein per 8 fl oz bottle) RD to follow for nutrition care plan  Assessment:   RD spoke with patient's daughter.  States patient's appetite is good.  PO intake variable at 20-100% per flowsheet records.  She is drinking her Ensure supplements.  Hopeful discharge to SNF in AM.  Diet Order:  Carbohydrate Modified Medium Calorie  Meds: Scheduled Meds:   . aspirin EC  81 mg Oral Daily  . atorvastatin  40 mg Oral Daily  . carvedilol  6.25 mg Oral BID WC  . cloNIDine  0.1 mg Oral TID  . digoxin  125 mcg Oral Daily  . [COMPLETED] diphenhydrAMINE  12.5 mg Oral Once  . docusate sodium  100 mg Oral BID  . feeding supplement  237 mL Oral TID BM  . furosemide  20 mg Oral Daily  . hydrALAZINE  25 mg Oral TID  . insulin aspart  0-9 Units Subcutaneous TID WC  . levalbuterol  2 puff Inhalation Q8H  . potassium chloride  40 mEq Oral BID  . [DISCONTINUED] furosemide  20 mg Intravenous Once  . [DISCONTINUED] levalbuterol  0.63 mg Nebulization Q8H   Continuous Infusions:  PRN Meds:.acetaminophen, acetaminophen, ondansetron (ZOFRAN) IV, ondansetron   CMP     Component Value Date/Time   NA 141 04/06/2012 0942   NA 140 11/02/2011 1611   K 4.3 04/06/2012 0942   CL 104 04/06/2012 0942   CO2 30 04/06/2012 0942   GLUCOSE 122* 04/06/2012 0942   GLUCOSE 104* 11/02/2011 1611   BUN 18 04/06/2012 0942   BUN 20 11/02/2011 1611   CREATININE 1.31* 04/06/2012 0942   CALCIUM 8.3* 04/06/2012 0942   PROT 6.0 04/04/2012 0149   PROT 6.6 11/02/2011 1611   ALBUMIN 3.2* 04/04/2012 0149   AST 16 04/04/2012 0149   ALT 7 04/04/2012 0149   ALKPHOS 74 04/04/2012 0149   BILITOT 0.2* 04/04/2012 0149   GFRNONAA 36* 04/06/2012 0942   GFRAA 41* 04/06/2012 0942    CBG (last 3)   Basename 04/06/12 1120 04/06/12 0719 04/05/12 2045  GLUCAP 160* 97 173*     Intake/Output Summary (Last 24 hours)  at 04/06/12 1412 Last data filed at 04/06/12 1129  Gross per 24 hour  Intake   1059 ml  Output      0 ml  Net   1059 ml    Weight Status:  65.1 kg (11/21) -- trending up  Estimated needs:  1500-1700 kcals, 75-85 gm protein  Nutrition Dx:  Inadequate Oral Intake r/t altered taste as evidenced by PO intake 20-100%, ongoing  Goal:  Oral intake with meals & supplements to meet >/= 90% of estimated nutrition needs, progressing  Monitor:  PO & supplemental intake, weight, labs, I/O's  Kirkland Hun, RD, LDN Pager #: [phone] After-Hours Pager #: [phone]

## 2012-04-06 NOTE — Progress Notes (Signed)
[facility] Progress Note    [name] HYQ:[id number] DOB: 1923-11-10 DOA: 04/03/2012 PCP: Ruthe Mannan, MD  Brief narrative: 78 year old female patient with history of known CAD and stents as well as significant peripheral vascular disease. She was brought to the emergency department because of complaints of dizziness and weakness. It is noted that her Lasix was resumed in the beginning of October. She was brought to the emergency department via EMS and was found to be hypotensive. No other associated symptoms. She did not lose consciousness. Her labs were unremarkable. She was given 2 L of normal saline and her blood pressure include to 90 systolic range. During the admitting physician's examination the patient endorsed having some brief chest discomfort. She was subsequently admitted to the step down unit for further monitoring and treatment.  Assessment/Plan: Principal Problem:  Elevated Troponin CAD-s/p stents Transient chest pain in the ER  Check Echo.   Mild bump in troponin may be related to altered perfusion, or possibly related to elevation in creatinine Cards Consult appreciated.  Rec:  Continue bb, statin, asa.  No further ischemic evaluation.  Atrial fibrillation/ Cardiac pacemaker in situ Per Cardiology: Pt with runs of afib overnight. Rates up to 150's for short periods. Frequent afib likely contributing to diastolic chf. Will have device interrogated today to re-assess burden of afib.  We may need to consider antiarrhythmic therapy given prior hospitalizations for chf in setting of afib rvr.  She is not a coumadin candidate, having been taken off of it in 01/2008 2/2 bleeding.   Acute on CKD (chronic kidney disease) stage 3, GFR 30-59 ml/min Base line creatinine appears to be 1.2 Creat improved.  Per cards:  she is very lasix sensitive with a seemingly narrow euvolemic window. Dr. Windell Hummingbird last note from 10/4 states that we will likely have to run her creat  slightly higher to avoid chf exacerbations.   Chronic diastolic heart failure BNP 9000+ 04/05/12 On Carvedilol. ASA, statin,  Home dose lasix resumed. Update Echocardiogram ordered Appreciate cards consultation.  Episodes of Afib may be contributing to CHF exacerbations. Considering change in antiarrythmic therapy.  Hypotension -> Subclavian artery stenosis, right Patient has chronically low BP in Right arm.  Accurate BP obtained in left arm. Has h/o BP differential with CT angio at [facility] 02/2011 demonstrating moderate prox L subclavian stenosis, high-grade circumferential stenosis at origin/proximal portion of innominate artery.  No further work up necessary.  HYPERTENSION, BENIGN ESSENTIAL Patient with weakness/dizzyness Per cardiology her BP may need to run a little high Clonidine was reduced by Dr. Patty Sermons to 0.1 TID on 11/20.  PVD (peripheral vascular disease)/Carotid stenosis Conservative management  Adjustment disorder with anxiety Patient expresses depression.  I'm Depressed, I just can't do what I want to do. No on and SSRIs.   Refer for treatment outpatient.  COPD Breath sounds coarse, but no wheeze.  Does not have home medications for this. CXR neg.  Will change from Nebs to inhaler today and request Respiratory therapy to give inhaler teaching.   Disposition - to [facility].  Social work/Case management consult placed.  DVT prophylaxis: SCDs Code Status: Full Family Communication: Spoke with patient, and also talked with son on the phone.  Family is considering placement. Disposition Plan: Transfer to telemetry  Consultants: [facility]  Procedures: None  Antibiotics: None  HPI/Subjective: I'm worried about my health.  Feels better today.   Objective: Blood pressure 185/73, pulse 62, temperature 98.2 F (36.8 C), temperature source Oral, resp. rate  16, height 5\' 5"  (1.651 m), weight 65.182 kg (143 lb 11.2 oz), SpO2  96.00%.  Intake/Output Summary (Last 24 hours) at 04/06/12 1051 Last data filed at 04/06/12 0912  Gross per 24 hour  Intake    600 ml  Output      0 ml  Net    600 ml     Exam: General: Alert and oriented appears weak, no apparent distress. Head: Atraumatic normocephalic Neck: supple without adenopathy Chest: Nontender to palpation, regular rate and rhythm, systolic murmur noted. Respiratory: min expiratory wheeze in lower left base. Abdomen: Soft nontender, nondistended, no masses. Extremities: No lower extremity edema. Psychiatric: Patient mood slightly depressed.  Data Reviewed: Basic Metabolic Panel:  Lab 04/06/12 6213 04/04/12 0149 04/03/12 2112  NA 140 141 139  K 4.3 3.4* 4.2  CL 104 104 99  CO2 25 26 26   GLUCOSE 98 122* 93  BUN 19 24* 27*  CREATININE 1.12* 1.42* 1.60*  CALCIUM 8.3* 7.4* 8.4  MG -- -- --  PHOS -- -- --   Liver Function Tests:  Lab 04/04/12 0149 04/03/12 2112  AST 16 20  ALT 7 8  ALKPHOS 74 87  BILITOT 0.2* 0.2*  PROT 6.0 7.0  ALBUMIN 3.2* 3.8   CBC:  Lab 04/04/12 0149 04/03/12 2112  WBC 7.1 7.9  NEUTROABS 4.2 5.1  HGB 11.5* 13.1  HCT 34.0* 37.8  MCV 93.2 92.6  PLT 285 305   Cardiac Enzymes:  Lab 04/04/12 1439 04/04/12 0945 04/04/12 0146 04/03/12 2113  CKTOTAL -- -- -- --  CKMB -- -- -- --  CKMBINDEX -- -- -- --  TROPONINI 0.46* 0.45* 0.32* <0.30   BNP (last 3 results)  Basename 04/05/12 1017 05/31/11 1242  PROBNP 9560.0* 542.0*   CBG:  Lab 04/06/12 0719 04/05/12 2045 04/05/12 1702 04/05/12 1131 04/05/12 0730  GLUCAP 97 173* 155* 158* 108*    Recent Results (from the past 240 hour(s))  MRSA PCR SCREENING     Status: Normal   Collection Time   04/04/12  2:00 AM      Component Value Range Status Comment   MRSA by PCR NEGATIVE  NEGATIVE Final      Studies:  Recent x-ray studies have been reviewed in detail by the Attending Physician  Scheduled Meds:  Reviewed in detail by the Attending Physician   Algis Downs, PA-C [facility] Pager: [phone]

## 2012-04-07 ENCOUNTER — Encounter: Payer: Self-pay | Admitting: Internal Medicine

## 2012-04-07 DIAGNOSIS — R0789 Other chest pain: Secondary | ICD-10-CM

## 2012-04-07 DIAGNOSIS — I708 Atherosclerosis of other arteries: Secondary | ICD-10-CM

## 2012-04-07 DIAGNOSIS — R42 Dizziness and giddiness: Secondary | ICD-10-CM | POA: Diagnosis present

## 2012-04-07 LAB — BASIC METABOLIC PANEL
CO2: 27 mEq/L (ref 19–32)
CO2: 32 mEq/L (ref 19–32)
Calcium: 8.4 mg/dL (ref 8.4–10.5)
Chloride: 100 mEq/L (ref 96–112)
Chloride: 103 mEq/L (ref 96–112)
Chloride: 102 mEq/L (ref 96–112)
GFR calc Af Amer: 40 mL/min — ABNORMAL LOW (ref >90)
GFR calc Af Amer: 41 mL/min — ABNORMAL LOW (ref >90)
GFR calc non Af Amer: 35 mL/min — ABNORMAL LOW (ref >90)
Glucose, Bld: 90 mg/dL (ref 70–99)
Potassium: 5.7 mEq/L — ABNORMAL HIGH (ref 3.5–5.1)
Potassium: 5.2 mEq/L — ABNORMAL HIGH (ref 3.5–5.1)
Sodium: 135 mEq/L (ref 135–145)
Sodium: 142 mEq/L (ref 135–145)
Sodium: 141 mEq/L (ref 135–145)

## 2012-04-07 LAB — GLUCOSE, CAPILLARY
Glucose-Capillary: 95 mg/dL (ref 70–99)
Glucose-Capillary: 154 mg/dL — ABNORMAL HIGH (ref 70–99)

## 2012-04-07 MED ORDER — HYDRALAZINE HCL 50 MG PO TABS
50.0000 mg | ORAL_TABLET | Freq: Three times a day (TID) | ORAL | Status: DC
  Administered 2012-04-07: 50 mg via ORAL
  Filled 2012-04-07 (×3): qty 1

## 2012-04-07 MED ORDER — SODIUM POLYSTYRENE SULFONATE 15 GM/60ML PO SUSP
30.0000 g | Freq: Once | ORAL | Status: AC
  Administered 2012-04-07: 30 g via ORAL
  Filled 2012-04-07: qty 120

## 2012-04-07 MED ORDER — CLONIDINE HCL 0.2 MG PO TABS: 0.1000 mg | ORAL_TABLET | Freq: Three times a day (TID) | ORAL | Status: DC

## 2012-04-07 MED ORDER — ENSURE COMPLETE PO LIQD: 237.0000 mL | Freq: Three times a day (TID) | ORAL | Status: DC

## 2012-04-07 MED ORDER — LEVALBUTEROL TARTRATE 45 MCG/ACT IN AERO: 2.0000 | INHALATION_SPRAY | Freq: Three times a day (TID) | RESPIRATORY_TRACT | Status: DC

## 2012-04-07 MED ORDER — HYDRALAZINE HCL 50 MG PO TABS: 50.0000 mg | ORAL_TABLET | Freq: Three times a day (TID) | ORAL | Status: DC

## 2012-04-07 NOTE — Progress Notes (Addendum)
Clinical Psychologist, occupational summary to [employer]	[place].  CSW to continue to follow and assist as needed. Phone number for RN report: [phone]   Angelia Mould, MSW, [place] [phone]

## 2012-04-07 NOTE — Clinical Social Work Psychosocial (Signed)
     Clinical Social Work Department BRIEF PSYCHOSOCIAL ASSESSMENT 04/07/2012  Patient:  [name], [name]     Account Number:  [id number]     Admit date:  04/03/2012  Clinical Social Worker:  Margaree Mackintosh  Date/Time:  04/07/2012 12:00 M  Referred by:  Physician  Date Referred:  04/07/2012 Referred for  SNF Placement   Other Referral:   Interview type:  Patient Other interview type:   And Granddtr-Dene.    PSYCHOSOCIAL DATA Living Status:  FAMILY Admitted from facility:   Level of care:   Primary support name:  Dene: [phone] Primary support relationship to patient:  CHILD, ADULT Degree of support available:   Granddtr.    CURRENT CONCERNS Current Concerns  Post-Acute Placement   Other Concerns:    SOCIAL WORK ASSESSMENT / PLAN Clinical Social Worker recieved referral indicating pt may benefit from SNF at dc to assist with increasing pt's level of independence.  CSW reviewed chart and met with pt.  Pt is interested in [facility].  CSW phoned [facility] who has a bed available for today.  CSW submitted appropriate clinical information and updated MD.  Pt to dc today.  CSW inquired with pt if there was anyone pt would like this CSW to update.  Pt stated her granddtr.  CSW phoned granddtr and updated to plan.  Granddtr requesting pt go by ambulance. Granddtr states understanding that insurance will determine how much is covered and what part family will be responsible for.  CSW to continue to follow and assist as needed.   Assessment/plan status:  Information/Referral to [facility] Other assessment/ plan:   Information/referral to community resources:   SNF.    PATIENTS/FAMILYS RESPONSE TO PLAN OF CARE: Pt and granddtr were pleasant and engaged in conversation. Both thanked CSW for intervention.

## 2012-04-07 NOTE — Clinical Social Work Placement (Signed)
     Clinical Social Work Department CLINICAL SOCIAL WORK PLACEMENT NOTE 04/07/2012  Patient:  [name], [name]  Account Number:  [id number] Admit date:  04/03/2012  Clinical Social Worker:  Margaree Mackintosh  Date/time:  04/07/2012 11:09 AM  Clinical Social Work is seeking post-discharge placement for this patient at the following level of care:   SKILLED NURSING   (*CSW will update this form in Epic as items are completed)   04/07/2012  Patient/family provided with [facility] Department of Clinical Social Works list of facilities offering this level of care within the geographic area requested by the patient (or if unable, by the patients family).  04/07/2012  Patient/family informed of their freedom to choose among providers that offer the needed level of care, that participate in Medicare, Medicaid or managed care program needed by the patient, have an available bed and are willing to accept the patient.  04/07/2012  Patient/family informed of [facility] ownership interest in [facility], as well as of the fact that they are under no obligation to receive care at this facility.  PASARR submitted to EDS on 04/07/2012 PASARR number received from EDS on 04/07/2012  FL2 transmitted to all facilities in geographic area requested by pt/family on  04/07/2012 FL2 transmitted to all facilities within larger geographic area on   Patient informed that his/her managed care company has contracts with or will negotiate with  certain facilities, including the following:     Patient/family informed of bed offers received:  04/07/2012 Patient chooses bed at [facility] Physician recommends and patient chooses bed at  [facility]  Patient to be transferred to [facility] on  04/07/2012 Patient to be transferred to facility by [facility].  The following physician request were entered in Epic:   Additional Comments:

## 2012-04-07 NOTE — Progress Notes (Addendum)
Subjective:  Patient feels "blah"  No SOB  No CP.  Objective: Filed Vitals:   04/06/12 1738 04/06/12 2042 04/06/12 2321 04/07/12 0500  BP: 163/55 180/66 178/50 183/50  Pulse: 65 60  61  Temp:  98 F (36.7 C)  98.2 F (36.8 C)  TempSrc:  Oral  Oral  Resp:    18  Height:      Weight:      SpO2: 96% 95%  98%   Weight change:   Intake/Output Summary (Last 24 hours) at 04/07/12 0981 Last data filed at 04/06/12 1850  Gross per 24 hour  Intake   1059 ml  Output      0 ml  Net   1059 ml    General: Alert, awake, oriented x3, in no acute distress Neck:  JVP is normal Heart: Regular rate and rhythm, without murmurs, rubs, gallops.  Lungs  Rhonchi that clear with cough Exemities:  No edema.   Neuro: Grossly intact, nonfocal.  Tele:  Apaced.   Lab Results: Results for orders placed during the hospital encounter of 04/03/12 (from the past 24 hour(s))  BASIC METABOLIC PANEL     Status: Abnormal   Collection Time   04/06/12  9:42 AM      Component Value Range   Sodium 141  135 - 145 mEq/L   Potassium 4.3  3.5 - 5.1 mEq/L   Chloride 104  96 - 112 mEq/L   CO2 30  19 - 32 mEq/L   Glucose, Bld 122 (*) 70 - 99 mg/dL   BUN 18  6 - 23 mg/dL   Creatinine, Ser 1.91 (*) 0.50 - 1.10 mg/dL   Calcium 8.3 (*) 8.4 - 10.5 mg/dL   GFR calc non Af Amer 36 (*) >90 mL/min   GFR calc Af Amer 41 (*) >90 mL/min  GLUCOSE, CAPILLARY     Status: Abnormal   Collection Time   04/06/12 11:20 AM      Component Value Range   Glucose-Capillary 160 (*) 70 - 99 mg/dL  GLUCOSE, CAPILLARY     Status: Abnormal   Collection Time   04/06/12  4:13 PM      Component Value Range   Glucose-Capillary 144 (*) 70 - 99 mg/dL  GLUCOSE, CAPILLARY     Status: Abnormal   Collection Time   04/06/12  9:44 PM      Component Value Range   Glucose-Capillary 157 (*) 70 - 99 mg/dL   Comment 1 Notify RN    BASIC METABOLIC PANEL     Status: Abnormal   Collection Time   04/07/12  5:27 AM      Component Value Range   Sodium 135  135 - 145 mEq/L   Potassium 5.7 (*) 3.5 - 5.1 mEq/L   Chloride 100  96 - 112 mEq/L   CO2 27  19 - 32 mEq/L   Glucose, Bld 90  70 - 99 mg/dL   BUN 22  6 - 23 mg/dL   Creatinine, Ser 4.78 (*) 0.50 - 1.10 mg/dL   Calcium 8.4  8.4 - 29.5 mg/dL   GFR calc non Af Amer 41 (*) >90 mL/min   GFR calc Af Amer 47 (*) >90 mL/min  GLUCOSE, CAPILLARY     Status: Normal   Collection Time   04/07/12  7:42 AM      Component Value Range   Glucose-Capillary 95  70 - 99 mg/dL    Studies/Results: @RISRSLT24 @  Medications:Reviewed.   Patient Active  Hospital Problem List: Hypotension (04/04/2012)   Assessment: Resolved  Now hypertensive.   HYPERTENSION, BENIGN ESSENTIAL (10/27/2006)   Assessment: She came into hosp on Clonidine 0.2 tid; Coreg 6.25 bid and Hydral 25 tid.  Reasonable to try increasing Hydral to 50 tid  She is now on Clon 0.1 tid.  I would not push BP too low as I worry with afib it may go even lower  CAD, NATIVE VESSEL-s/p stents (06/19/2009)   Assessment: Mild bump in troponin.  May be related to hypotension   Clinically asymptomatic  Continue meds.  Atrial fibrillation (09/15/2009)   Assessment: No recurrence.     Plan:  Chronic diastolic heart failure (01/04/2008)   Assessment: Echo was done later yesterday  Will review   Cardiac pacemaker in situ (09/15/2009)   Assessment: CKD (chronic kidney disease) stage 3, GFR 30-59 ml/min (04/04/2012)   Assessment:  Patient's K was 5.7   Repeat pending  Pt was getting K supplement  Have D/C'd.    Now on lasix 40  Was on 20 lasix at home  Will need to follow closely as outpt.  LOS: 4 days   Dietrich Pates 04/07/2012, 8:22 AM

## 2012-04-07 NOTE — Progress Notes (Signed)
Occupational Therapy Treatment Patient Details Name: [name] MRN: [id number] DOB: [birth year] Today's Date: 04/07/2012 Time: 4098-1191 OT Time Calculation (min): 14 min  OT Assessment / Plan / Recommendation Comments on Treatment Session Pt seen again for OT to assist with toilet transfer practice. Pt admitted for dizziness and hypotension and reports feeling fatigued with minimal activity. She will benefit from SNF at d/c which pt is agreeable to.    Follow Up Recommendations  SNF    Barriers to Discharge       Equipment Recommendations  None recommended by PT;None recommended by OT    Recommendations for Other Services    Frequency Min 2X/week   Plan Discharge plan remains appropriate    Precautions / Restrictions Precautions Precautions: Fall Restrictions Weight Bearing Restrictions: No        ADL   Toilet Transfer: Performed;Minimal assistance Toilet Transfer Method: Stand pivot Toilet Transfer Equipment: Bedside commode Toileting - Clothing Manipulation and Hygiene: Performed;Minimal assistance;Other (comment) (see below) Where Assessed - Toileting Clothing Manipulation and Hygiene: Sit to stand from 3-in-1 or toilet Tub/Shower Transfer Method: Not assessed Equipment Used: Rolling walker ADL Comments: Pt up in chair and calling out to use BSC due to urgency of need to have bowel movement. OT assisted pt onto [facility] next to chair but due to urgency, didnt use RW. Min assist for stand pivot to [facility] and pt given min assist to help get Depends down due to urgency. Pt able to stand after BM and perform toilet hygiene with min guard assist. Returned to bed and bed alarm set.  Nursing informed of pt's large BM and return to bed.    OT Diagnosis: Generalized weakness  OT Problem List: Decreased strength;Decreased activity tolerance;Decreased knowledge of use of DME or AE OT Treatment Interventions: Self-care/ADL training;Therapeutic activities;DME and/or AE  instruction;Patient/family education   OT Goals Acute Rehab OT Goals OT Goal Formulation: With patient Time For Goal Achievement: 04/21/12 Potential to Achieve Goals: Good ADL Goals Pt Will Perform Grooming: with supervision;Other (comment) (3 tasks) ADL Goal: Grooming - Progress: Goal set today Pt Will Perform Lower Body Bathing: with supervision;Sit to stand from chair;Sit to stand from bed;Sit to stand in shower ADL Goal: Lower Body Bathing - Progress: Goal set today Pt Will Perform Lower Body Dressing: with supervision;Sit to stand from chair;Sit to stand from bed ADL Goal: Lower Body Dressing - Progress: Goal set today Pt Will Transfer to Toilet: with supervision;Ambulation;with DME;3-in-1 ADL Goal: Toilet Transfer - Progress: Progressing toward goals Pt Will Perform Toileting - Clothing Manipulation: with supervision;Standing ADL Goal: Toileting - Clothing Manipulation - Progress: Progressing toward goals  Visit Information  Last OT Received On: 04/07/12 Assistance Needed: +1    Subjective Data  Subjective: I really need to get on the commode Patient Stated Goal: go to the bathroom   Prior Functioning  Home Living Lives With: Other (Comment);Son Available Help at Discharge: Family;Other (Comment) (son) Type of Home: House Home Access: Ramped entrance Home Layout: One level Bathroom Shower/Tub: Other (comment) (pt sponge bathes) Bathroom Toilet: Standard Bathroom Accessibility: Yes How Accessible: Accessible via walker Home Adaptive Equipment: Bedside commode/3-in-1;Walker - four wheeled Prior Function Level of Independence: Needs assistance Needs Assistance: Meal Prep;Light Housekeeping Meal Prep: Total Light Housekeeping: Total Able to Take Stairs?: No Driving: No Communication Communication: No difficulties Dominant Hand: Right    Cognition  Overall Cognitive Status: Appears within functional limits for tasks assessed/performed Arousal/Alertness:  Awake/alert Orientation Level: Appears intact for tasks assessed Behavior During Session:  WFL for tasks performed    Mobility  Shoulder Instructions Bed Mobility Sit to Supine: 5: Supervision;HOB elevated Transfers Transfers: Sit to Stand;Stand to Sit Sit to Stand: 4: Min guard;From chair/3-in-1 Stand to Sit: 4: Min guard;To bed;To chair/3-in-1 Details for Transfer Assistance: min verbal cues for hand placement       Exercises      Balance Balance Balance Assessed: Yes Dynamic Standing Balance Dynamic Standing - Level of Assistance: 5: Stand by assistance   End of Session OT - End of Session Activity Tolerance: Patient limited by fatigue Patient left: in bed;with call bell/phone within reach;with bed alarm set Nurse Communication: Mobility status  GO     Lennox Laity [phone] 04/07/2012, 11:15 AM

## 2012-04-07 NOTE — Evaluation (Addendum)
Occupational Therapy Evaluation Patient Details Name: JOCEYLN CHASTAIN MRN: [id number] DOB: 1923-08-15 Today's Date: 04/07/2012 Time: 1308-6578 OT Time Calculation (min): 14 min  OT Assessment / Plan / Recommendation Clinical Impression  pt admitted with weakness and dizziness and reports feeling fatigued with minimal ADL activity. She will benefit from skilled OT services to improve ADL independence for next venue of care. Per nursing no chair alarm available. States she will help her back to bed to be on bed alarm.     OT Assessment  Patient needs continued OT Services    Follow Up Recommendations  SNF    Barriers to Discharge      Equipment Recommendations  None recommended by PT;None recommended by OT    Recommendations for Other Services    Frequency  Min 2X/week    Precautions / Restrictions Precautions Precautions: Fall Restrictions Weight Bearing Restrictions: No        ADL  Eating/Feeding: Simulated;Independent Where Assessed - Eating/Feeding: Chair Grooming: Simulated;Wash/dry hands;Set up Where Assessed - Grooming: Unsupported sitting Upper Body Bathing: Simulated;Chest;Right arm;Left arm;Abdomen;Set up;Supervision/safety Where Assessed - Upper Body Bathing: Unsupported sitting Lower Body Bathing: Simulated;Min guard Where Assessed - Lower Body Bathing: Supported sit to stand Upper Body Dressing: Simulated;Supervision/safety;Set up Where Assessed - Upper Body Dressing: Unsupported sitting Lower Body Dressing: Simulated;Min guard Where Assessed - Lower Body Dressing: Supported sit to stand Toilet Transfer: Simulated;Minimal assistance Toilet Transfer Method: up from bed, around the end of bed to chair with RW Toilet Transfer Equipment:  Toileting - Clothing Manipulation and Hygiene: Simulated with min guard assist Where Assessed - Engineer, mining and Hygiene: Sit to stand from 3-in-1 or toilet Tub/Shower Transfer Method: Not  assessed Equipment Used: Rolling walker ADL comments. Up to chair with RW. Pt declined need to use toilet.    OT Diagnosis: Generalized weakness  OT Problem List: Decreased strength;Decreased activity tolerance;Decreased knowledge of use of DME or AE OT Treatment Interventions: Self-care/ADL training;Therapeutic activities;DME and/or AE instruction;Patient/family education   OT Goals Acute Rehab OT Goals OT Goal Formulation: With patient Time For Goal Achievement: 04/21/12 Potential to Achieve Goals: Good ADL Goals Pt Will Perform Grooming: with supervision;Other (comment) (3 tasks) ADL Goal: Grooming - Progress: Goal set today Pt Will Perform Lower Body Bathing: with supervision;Sit to stand from chair;Sit to stand from bed;Sit to stand in shower ADL Goal: Lower Body Bathing - Progress: Goal set today Pt Will Perform Lower Body Dressing: with supervision;Sit to stand from chair;Sit to stand from bed ADL Goal: Lower Body Dressing - Progress: Goal set today Pt Will Transfer to Toilet: with supervision;Ambulation;with DME;3-in-1 ADL Goal: Toilet Transfer - Progress: Progressing toward goals Pt Will Perform Toileting - Clothing Manipulation: with supervision;Standing ADL Goal: Toileting - Clothing Manipulation - Progress: Progressing toward goals  Visit Information  Last OT Received On: 04/07/12 Assistance Needed: +1    Subjective Data  Subjective: I really need to get on the commode Patient Stated Goal: go to the bathroom   Prior Functioning     Home Living Lives With: Other (Comment);Son Available Help at Discharge: Family;Other (Comment) (son) Type of Home: House Home Access: Ramped entrance Home Layout: One level Bathroom Shower/Tub: Other (comment) (pt sponge bathes) Bathroom Toilet: Standard Bathroom Accessibility: Yes How Accessible: Accessible via walker Home Adaptive Equipment: Bedside commode/3-in-1;Walker - four wheeled Prior Function Level of Independence:  Needs assistance Needs Assistance: Meal Prep;Light Housekeeping Meal Prep: Total Light Housekeeping: Total Able to Take Stairs?: No Driving: No Communication Communication: No difficulties Dominant Hand:  Right         Vision/Perception     Cognition  Overall Cognitive Status: Appears within functional limits for tasks assessed/performed Arousal/Alertness: Awake/alert Orientation Level: Appears intact for tasks assessed Behavior During Session: [facility] for tasks performed    Extremity/Trunk Assessment Right Upper Extremity Assessment RUE ROM/Strength/Tone: [facility] for tasks assessed Left Upper Extremity Assessment LUE ROM/Strength/Tone: WFL for tasks assessed     Mobility Bed Mobility Bed Mobility: Supine to Sit Supine to Sit: 5: Supervision;HOB elevated Sitting - Scoot to Edge of Bed: 5: Supervision  Transfers Transfers: Sit to Stand;Stand to Sit Sit to Stand: 4: Min guard;From chair/3-in-1 Stand to Sit: 4: Min guard;To bed;To chair/3-in-1 Details for Transfer Assistance: min verbal cues for hand placement     Shoulder Instructions     Exercise     Balance Balance Balance Assessed: Yes Dynamic Standing Balance Dynamic Standing - Level of Assistance: 5: Stand by assistance   End of Session OT - End of Session Activity Tolerance: Patient limited by fatigue Patient left: in bed;with call bell/phone within reach;with bed alarm set Nurse Communication: Mobility status  GO     Lennox Laity [phone] 04/07/2012, 11:19 AM

## 2012-04-07 NOTE — Discharge Summary (Signed)
Physician Discharge Summary  CHRYS LANDGREBE ZOX:[id number] DOB: 20-Jul-1923 DOA: 04/03/2012  PCP: Ruthe Mannan, MD  Admit date: 04/03/2012 Discharge date: 04/07/2012  Time spent: 40 min minutes  Recommendations for Outpatient Follow-up:  1. Check a bmet on Monday 04/10/12.  Patient is on lasix and potassium has been stopped.  Recently had hypokalemia 2. For Physical Therapy, Occupational Therapy.   Weak, balance issues. 3. Take BP in left arm only.  right arm BP will always be low and in-accurate. 4. Monitor BP. BP medications changed during this hospitalization.   Better for SBP to run slightly high (120 - 150s) 5. Closely monitor volume status and creatinine.  Very sensitive to lasix. 6. Cardiology follow up with Dr. Mariah Milling in 1 week to evaluate diuretic dosing and possible change an antiarrythmic therapy.  Discharge Diagnoses:  Principal Problem:  *Dizziness Active Problems:  HYPERTENSION, BENIGN ESSENTIAL  Chronic diastolic heart failure  COPD  CKD (chronic kidney disease) stage 3, GFR 30-59 ml/min  Dehydration  history of PITUITARY ADENOMA- s/p gamma knife  Adjustment disorder with anxiety  CAD, NATIVE VESSEL-s/p stents  H/O Atrial fibrillation  Cardiac pacemaker in situ  PVD (peripheral vascular disease) H/O SMA stenosis-moderate    Discharge Condition: Feels weak.  Has rhonchi in lungs that clear on cough  Diet recommendation: Heart Healthy  Filed Weights   04/04/12 0125 04/06/12 0600  Weight: 60.7 kg (133 lb 13.1 oz) 65.182 kg (143 lb 11.2 oz)    History of present illness:  78 year old female patient with history of known CAD and stents as well as significant peripheral vascular disease. She was brought to the emergency department because of complaints of dizziness and weakness. It is noted that her Lasix was resumed in the beginning of October. She was brought to the emergency department via EMS and was found to be hypotensive in the left arm.  In hind sight  this was likely mis-leading as the patient has stenosis in her left subclavian artery and is always hypotensive when her BP is taken in the left arm.  During the admitting physician's examination the patient endorsed having some brief chest discomfort. She was subsequently admitted to the step down unit for further monitoring and treatment.  Hospital Course:   Dizziness Likely multifactorial. At the time of admission the patient was dehydrated and in acute on chronic renal failure possibly from lasix use and lack of PO intake.  She received IVF and improved.   Further she experienced runs of Afib, mild CHF and mild COPD exacerbations.  Finally she worked with PT and was found to be very deconditioned and weak.  While we do not have one definitive cause of dizziness, the combination of these factors would cause her symptoms.  Her dizziness has improved.  Elevated Troponin  H/o CAD-s/p stents  [facility] Consult appreciated. Recommended: Continue bb, statin, asa. No further ischemic evaluation.  Mild bump in troponin may be related to altered perfusion, or possibly related to elevation in creatinine.   Echocardiogram was updated results are listed below but EF was 55 - 60% with no wall motion abnormalities and mildly elevated pulmonary artery pressures.  Chest pain resolved after admission.  Atrial fibrillation/ Cardiac pacemaker in situ  The patient did have several runs of  Atrial fibrillation which were asymptomatic.  Rates were up to 150's for short periods. Atrial fibrillation likely contributing to diastolic chf. Will have device interrogated to re-assess burden of afib.  We may need to consider antiarrhythmic therapy given  prior hospitalizations for chf in setting of afib rvr.  This will be considered in hospital follow up as an outpatient.  She is not a coumadin candidate, having been taken off of it in 01/2008 secondary to bleeding.   Acute on CKD stage 3 (chronic kidney disease)  stage 3, GFR 30-59 ml/min  Caused by dehydration and lasix. Base line creatinine appears to be 1.2  She is very lasix sensitive with a seemingly narrow euvolemic window. Cardiology recommends that the patient remain slightly dry with mildly elevated creatinine to avoid chf exacerbations.  Her volume status needs to be closely monitored.  She may need every other day dosing of diuretics.  Chronic diastolic heart failure  BNP 9000+ 04/05/12  On Carvedilol. ASA, statin, Home dose lasix resumed.  Echocardiogram updated and resulted are listed below.   Appreciate cards consultation. Episodes of Afib may be contributing to CHF exacerbations. Considering change in antiarrythmic therapy in outpatient follow up.  Hypotension -> Subclavian artery stenosis, right  Patient has chronically low BP in Right arm. Accurate BP obtained in left arm.  Has h/o BP differential with CT angio at [facility] 02/2011 demonstrating moderate prox L subclavian stenosis, high-grade circumferential stenosis at origin/proximal portion of innominate artery. No further work up necessary.   HYPERTENSION, BENIGN ESSENTIAL  Patient with weakness/dizzyness  Per cardiology her BP may need to run a little high  Clonidine was reduced by Dr. Patty Sermons to 0.1 TID on 11/20. Hydralazine was increased to 50 mg TID on 11/22.  PVD (peripheral vascular disease)/Carotid stenosis  Conservative management   Adjustment disorder with anxiety  Patient expresses depression. I'm Depressed, I just can't do what I want to do.  No on and SSRIs.  Refer for treatment outpatient.   COPD  Breath sounds coarse, but no wheeze. Does not have home medications for this.  CXR neg.  Will change from Nebs to inhaler today and request Respiratory therapy to give inhaler teaching.   Procedures:  Echocardiogram, results pending.  Consultations:  [facility]  Discharge Exam: Filed Vitals:   04/07/12 0834 04/07/12 0900 04/07/12 1028 04/07/12 1029    BP: 156/71 165/71 189/49   Pulse:    65  Temp:      TempSrc:      Resp:      Height:      Weight:      SpO2:  98%      General: Alert and oriented appears weak, no apparent distress.  Head: Atraumatic normocephalic  Neck: supple without adenopathy  Chest: Nontender to palpation, regular rate and rhythm, systolic murmur noted. Subclavian bruit on the left.  Respiratory: Coarse breath sounds throughout. No accessory muscle use.  Abdomen: Soft nontender, nondistended, no masses.  Extremities: No lower extremity edema.  Psychiatric: Patient mood depressed.   Discharge Instructions      Discharge Orders    Future Appointments: Provider: Department: Dept Phone: Center:   05/08/2012 3:30 PM [facility] [place] [phone] [facility]   05/22/2012 2:30 PM Antonieta Iba, MD [facility] [place] [phone] [facility]     Future Orders Please Complete By Expires   Diet - low sodium heart healthy      Increase activity slowly          Medication List     As of 04/07/2012 12:24 PM    TAKE these medications         acetaminophen 500 MG tablet   Commonly known as: TYLENOL  Take 1-2 tablets (500-1,000 mg total) by mouth every 6 (six) hours as needed for pain.      aspirin 81 MG tablet   Take 81 mg by mouth daily.      atorvastatin 40 MG tablet   Commonly known as: LIPITOR   Take 1 tablet (40 mg total) by mouth daily.      carvedilol 6.25 MG tablet   Commonly known as: COREG   Take 1 tablet (6.25 mg total) by mouth 2 (two) times daily with a meal.      cloNIDine 0.2 MG tablet   Commonly known as: CATAPRES   Take 0.5 tablets (0.1 mg total) by mouth 3 (three) times daily.      digoxin 0.125 MG tablet   Commonly known as: LANOXIN   Take 1 tablet (125 mcg total) by mouth daily.      docusate sodium 100 MG capsule   Commonly known as: COLACE   Take 1 capsule (100 mg total) by mouth 2 (two) times daily.      feeding  supplement Liqd   Take 237 mLs by mouth 3 (three) times daily between meals.      furosemide 20 MG tablet   Commonly known as: LASIX   Take 1 tablet (20 mg total) by mouth daily.      hydrALAZINE 50 MG tablet   Commonly known as: APRESOLINE   Take 1 tablet (50 mg total) by mouth 3 (three) times daily.      levalbuterol 45 MCG/ACT inhaler   Commonly known as: XOPENEX HFA   Inhale 2 puffs into the lungs every 8 (eight) hours.      metFORMIN 500 MG (MOD) 24 hr tablet   Commonly known as: GLUMETZA   Take 1 tablet (500 mg total) by mouth 2 (two) times daily with a meal.      mirtazapine 30 MG tablet   Commonly known as: REMERON   Take 1 tablet (30 mg total) by mouth at bedtime.      multivitamin with minerals Tabs   Take 1 tablet by mouth daily.      nitroGLYCERIN 0.4 MG SL tablet   Commonly known as: NITROSTAT   Place 0.4 mg under the tongue every 5 (five) minutes as needed. For chest pain      ondansetron 4 MG tablet   Commonly known as: ZOFRAN   Take 4 mg by mouth every 8 (eight) hours as needed. For nausea         Follow-up Information    Follow up with Ruthe Mannan, MD. In 1 month.   Contact information:   [address] [place] [address] [place], [place] [address] [phone]       Follow up with Julien Nordmann, MD. Schedule an appointment as soon as possible for a visit in 1 week.   Contact information:   [address] [place] [address] [phone]           The results of significant diagnostics from this hospitalization (including imaging, microbiology, ancillary and laboratory) are listed below for reference.    Significant Diagnostic Studies:  2D echocardiogram Study Conclusions  - Left ventricle: The cavity size was normal. Wall thickness was increased in a pattern of mild LVH. Systolic function was normal. The estimated ejection fraction was in the range of 55% to 60%. Wall motion was normal; there were no regional  wall motion abnormalities. Doppler parameters are consistent with high ventricular filling  pressure. - Mitral valve: Moderately calcified annulus. Mildly thickened leaflets . Mild regurgitation. - Left atrium: The atrium was moderately dilated. - Right ventricle: The cavity size was mildly dilated. - Right atrium: The atrium was moderately dilated. - Pulmonary arteries: Systolic pressure was mildly to moderately increased. PA peak pressure: 48mm Hg (S).  Dg Chest 2 View  04/05/2012  *RADIOLOGY REPORT*  Clinical Data: Abnormal breath sounds on physical exam  CHEST - 2 VIEW  Comparison: 04/03/2012  Findings: The heart and pulmonary vascularity are stable.  A pacing device is again seen.  The lungs are clear bilaterally.  No acute bony abnormality is seen.  IMPRESSION: No acute abnormality noted.   Original Report Authenticated By: Alcide Clever, M.D.    Dg Chest Portable 1 View  04/04/2012  *RADIOLOGY REPORT*  Clinical Data: Dizziness, weakness.  PORTABLE CHEST - 1 VIEW  Comparison: 02/04/2012  Findings: Left chest wall battery pack with lead tips project over the right atrium and right ventricle, similar to prior. Heart size upper normal to mildly enlarged. Aortic atherosclerosis. Hyperinflation with mild lung base opacities.  No pleural effusion or pneumothorax.  Osteopenia. Mild thoracolumbar curvature.  IMPRESSION: Hyperinflation with mild lung base opacities, likely atelectasis.   Original Report Authenticated By: Jearld Lesch, M.D.     Microbiology: Recent Results (from the past 240 hour(s))  MRSA PCR SCREENING     Status: Normal   Collection Time   04/04/12  2:00 AM      Component Value Range Status Comment   MRSA by PCR NEGATIVE  NEGATIVE Final      Labs: Basic Metabolic Panel:  Lab 04/07/12 1610 04/07/12 0527 04/06/12 0942 04/06/12 0630 04/04/12 0149  NA 142 135 141 140 141  K 5.2* 5.7* 4.3 4.3 3.4*  CL 103 100 104 104 104  CO2 32 27 30 25 26   GLUCOSE 124* 90 122* 98 122*    BUN 21 22 18 19  24*  CREATININE 1.34* 1.17* 1.31* 1.12* 1.42*  CALCIUM 9.0 8.4 8.3* 8.3* 7.4*  MG -- -- -- -- --  PHOS -- -- -- -- --   Liver Function Tests:  Lab 04/04/12 0149 04/03/12 2112  AST 16 20  ALT 7 8  ALKPHOS 74 87  BILITOT 0.2* 0.2*  PROT 6.0 7.0  ALBUMIN 3.2* 3.8   CBC:  Lab 04/04/12 0149 04/03/12 2112  WBC 7.1 7.9  NEUTROABS 4.2 5.1  HGB 11.5* 13.1  HCT 34.0* 37.8  MCV 93.2 92.6  PLT 285 305   Cardiac Enzymes:  Lab 04/04/12 1439 04/04/12 0945 04/04/12 0146 04/03/12 2113  CKTOTAL -- -- -- --  CKMB -- -- -- --  CKMBINDEX -- -- -- --  TROPONINI 0.46* 0.45* 0.32* <0.30   BNP: BNP (last 3 results)  Basename 04/05/12 1017 05/31/11 1242  PROBNP 9560.0* 542.0*   CBG:  Lab 04/07/12 0742 04/06/12 2144 04/06/12 1613 04/06/12 1120 04/06/12 0719  GLUCAP 95 157* 144* 160* [address]       SignedConley Canal [phone]  [facility] 04/07/2012, 12:24 PM

## 2012-04-07 NOTE — Discharge Summary (Signed)
Patient seen and examined. Agree with note by Algis Downs, PA. Patient has been deemed medically ready for DC today. She will go to SNF for further physical conditioning prior to returning home.  Peggye Pitt, MD [facility] Pager: [phone]

## 2012-04-11 LAB — BASIC METABOLIC PANEL
Anion Gap: 5 — ABNORMAL LOW (ref 7–16)
Chloride: 105 mmol/L (ref 98–107)
Co2: 29 mmol/L (ref 21–32)
Creatinine: 1.41 mg/dL — ABNORMAL HIGH (ref 0.60–1.30)
EGFR (African American): 39 — ABNORMAL LOW
Osmolality: 284 (ref 275–301)
Potassium: 4.8 mmol/L (ref 3.5–5.1)
Sodium: 139 mmol/L (ref 136–145)

## 2012-04-16 ENCOUNTER — Encounter: Payer: Self-pay | Admitting: Internal Medicine

## 2012-04-18 ENCOUNTER — Ambulatory Visit (INDEPENDENT_AMBULATORY_CARE_PROVIDER_SITE_OTHER): Payer: Medicare Other | Admitting: Cardiovascular Disease

## 2012-04-18 ENCOUNTER — Encounter: Payer: Self-pay | Admitting: Cardiovascular Disease

## 2012-04-18 ENCOUNTER — Ambulatory Visit: Payer: Medicare Other | Admitting: Family Medicine

## 2012-04-18 VITALS — BP 164/60 | HR 60 | Ht 65.0 in | Wt 145.5 lb

## 2012-04-18 DIAGNOSIS — I5030 Unspecified diastolic (congestive) heart failure: Secondary | ICD-10-CM

## 2012-04-18 DIAGNOSIS — R609 Edema, unspecified: Secondary | ICD-10-CM

## 2012-04-18 DIAGNOSIS — I4891 Unspecified atrial fibrillation: Secondary | ICD-10-CM

## 2012-04-18 DIAGNOSIS — I10 Essential (primary) hypertension: Secondary | ICD-10-CM

## 2012-04-18 DIAGNOSIS — I2510 Atherosclerotic heart disease of native coronary artery without angina pectoris: Secondary | ICD-10-CM

## 2012-04-18 DIAGNOSIS — I509 Heart failure, unspecified: Secondary | ICD-10-CM

## 2012-04-18 DIAGNOSIS — I739 Peripheral vascular disease, unspecified: Secondary | ICD-10-CM

## 2012-04-18 LAB — BASIC METABOLIC PANEL
Anion Gap: 4 — ABNORMAL LOW (ref 7–16)
BUN: 42 mg/dL — ABNORMAL HIGH (ref 7–18)
Chloride: 104 mmol/L (ref 98–107)
Co2: 29 mmol/L (ref 21–32)
Osmolality: 283 (ref 275–301)

## 2012-04-18 NOTE — Assessment & Plan Note (Signed)
Significant diffuse peripheral vascular disease. Continue aggressive lipid management. Goal LDL less than 70.

## 2012-04-18 NOTE — Assessment & Plan Note (Signed)
Currently with no symptoms of angina. No further workup at this time. Continue current medication regimen. 

## 2012-04-18 NOTE — Assessment & Plan Note (Signed)
Complaints today of ankle edema. Underlying pulmonary disease. We'll need to continue Lasix 10 mg, possibly alternating with 20 mg. We have suggested she take 20 mg as needed for worsening edema or shortness of breath. Significant fluid intake at [facility].

## 2012-04-18 NOTE — Patient Instructions (Addendum)
You are doing well. Continue lasix 10 mg daily Please take lasix 20 mg daily as needed for worsening edema, weight gain, increasing shortness of breath  Please call us if you have new issues that need to be addressed before your next appt.  Your physician wants you to follow-up in: 2 months.

## 2012-04-18 NOTE — Progress Notes (Signed)
Patient ID: [name], female    DOB: Nov 18, 1923, 78 y.o.   MRN: [id number]  HPI Comments: [name] is a very pleasant 78 year old woman with past medical history of coronary artery disease, stent placed to her left circumflex in 2003 which was patent on catheterization in 2008, history of peripheral vascular disease with bilateral carotid disease, 79% of the right internal carotid, 40-59% left internal carotid artery, also history of COPD, history of respiratory failure in August 2009 secondary to rapid atrial fibrillation and diastolic relaxation abnormality, history of sick sinus syndrome with ablation in May of 2009 with discontinuation of Coumadin in September of 09 secondary to bleeding.  She has had a recent hospital admission from October 10 to March 03, 2011. This was for pneumonia. Gentle diuresis was started she had acute renal failure. She had mildly elevated troponin which was secondary to demand ischemia. Normal LV function on echocardiogram with mild MR and TR.  CT Scan in the hospital showed high-grade stenosis at the origin and proximal portion of the innominate artery, high-grade stenosis of the right carotid bifurcation, moderate stenosis of the proximal prevertebral left subclavian artery.  She reports having hospital admission x2 for abdominal pain, diagnosis of mesenteric disease, possible mild mesenteric ischemia. She reports that the second episode to the hospital was from a episode of constipation with bleeding. She was diagnosed with diverticuli. Her Lasix was held after discharge from the hospital. As an outpatient, she had worsening edema and she was drinking significant fluids. Lasix was restarted.  Recent hospital admission 2 weeks ago for dizziness. Etiology was unclear. Creatinine on arrival was 1.6. She was seen by cardiology. No intervention performed. Poor gait, weak legs.  Blood pressure was low and clonidine dose was decreased. Currently now at  [facility] rehabilitation. She is on Lasix 10 mg daily. She reports worsening ankle edema.    history of knee replacement bilaterally over 10 years ago. Chronic pain in her knees limits her ability to ambulate  EKG shows paced rhythm with rate 60 beats per minute, nonspecific ST abnormality   Outpatient Encounter Prescriptions as of 04/18/2012  Medication Sig Dispense Refill  . acetaminophen (TYLENOL EX ST ARTHRITIS PAIN) 500 MG tablet Take 1-2 tablets (500-1,000 mg total) by mouth every 6 (six) hours as needed for pain.      [name] aspirin 81 MG tablet Take 81 mg by mouth daily.      [name] atorvastatin (LIPITOR) 40 MG tablet Take 1 tablet (40 mg total) by mouth daily.  30 tablet  3  . carvedilol (COREG) 12.5 MG tablet Take 12.5 mg by mouth 2 (two) times daily with a meal.      . cloNIDine (CATAPRES) 0.2 MG tablet Take 0.5 tablets (0.1 mg total) by mouth 3 (three) times daily.  90 tablet  5  . digoxin (LANOXIN) 0.125 MG tablet Take 1 tablet (125 mcg total) by mouth daily.  30 tablet  6  . docusate sodium (COLACE) 100 MG capsule Take 1 capsule (100 mg total) by mouth 2 (two) times daily.  60 capsule  4  . feeding supplement (ENSURE COMPLETE) LIQD Take 237 mLs by mouth 3 (three) times daily between meals.      . furosemide (LASIX) 20 MG tablet Take 1 tablet (20 mg total) by mouth daily.  90 tablet  3  . hydrALAZINE (APRESOLINE) 50 MG tablet Take 1 tablet (50 mg total) by mouth 3 (three) times daily.  90 tablet  3  .  levalbuterol (XOPENEX HFA) 45 MCG/ACT inhaler Inhale 2 puffs into the lungs as needed.      . metFORMIN (GLUMETZA) 500 MG (MOD) 24 hr tablet Take 1 tablet (500 mg total) by mouth 2 (two) times daily with a meal.  60 tablet  6  . mirtazapine (REMERON) 7.5 MG tablet Take 7.5 mg by mouth at bedtime.      . Multiple Vitamin (MULTIVITAMIN WITH MINERALS) TABS Take 1 tablet by mouth daily.      . nitroGLYCERIN (NITROSTAT) 0.4 MG SL tablet Place 0.4 mg under the tongue every 5 (five) minutes as needed.  For chest pain      . ondansetron (ZOFRAN) 4 MG tablet Take 4 mg by mouth every 8 (eight) hours as needed. For nausea      . PREDNISONE, PAK, PO Take by mouth. Taper.      . tiotropium (SPIRIVA) 18 MCG inhalation capsule Place 18 mcg into inhaler and inhale daily.         Review of Systems  Constitutional: Negative.   HENT: Negative.   Eyes: Negative.   Respiratory: Negative.   Cardiovascular: Positive for leg swelling.  Gastrointestinal: Negative.   Musculoskeletal: Positive for gait problem.  Skin: Negative.   Neurological: Negative.   Hematological: Negative.   All other systems reviewed and are negative.    BP 164/60  Pulse 60  Ht 5\' 5"  (1.651 m)  Wt 145 lb 8 oz (65.998 kg)  BMI 24.21 kg/m2  Physical Exam  Nursing note and vitals reviewed. Constitutional: She is oriented to person, place, and time. She appears well-developed and well-nourished.  HENT:  Head: Normocephalic.  Nose: Nose normal.  Mouth/Throat: Oropharynx is clear and moist.  Eyes: Conjunctivae normal are normal. Pupils are equal, round, and reactive to light.  Neck: Normal range of motion. Neck supple. No JVD present.  Cardiovascular: Normal rate, regular rhythm, S1 normal, S2 normal and intact distal pulses.  Exam reveals no gallop and no friction rub.   Murmur heard.  Systolic murmur is present with a grade of 2/6       Trace to 1+ pitting edema to the mid shins bilaterally  Pulmonary/Chest: Effort normal and breath sounds normal. No respiratory distress. She has no wheezes. She has no rales. She exhibits no tenderness.  Abdominal: Soft. Bowel sounds are normal. She exhibits no distension. There is no tenderness.  Musculoskeletal: Normal range of motion. She exhibits no edema and no tenderness.  Lymphadenopathy:    She has no cervical adenopathy.  Neurological: She is alert and oriented to person, place, and time. Coordination normal.  Skin: Skin is warm and dry. No rash noted. No erythema.   Psychiatric: She has a normal mood and affect. Her behavior is normal. Judgment and thought content normal.         Assessment and Plan

## 2012-04-18 NOTE — Assessment & Plan Note (Signed)
We have suggested she continue on Lasix 10 mg daily, take 20 mg when necessary for worsening edema or shortness of breath.

## 2012-05-18 DIAGNOSIS — I6529 Occlusion and stenosis of unspecified carotid artery: Secondary | ICD-10-CM

## 2012-05-18 DIAGNOSIS — E1149 Type 2 diabetes mellitus with other diabetic neurological complication: Secondary | ICD-10-CM

## 2012-05-18 DIAGNOSIS — G909 Disorder of the autonomic nervous system, unspecified: Secondary | ICD-10-CM

## 2012-05-18 DIAGNOSIS — N189 Chronic kidney disease, unspecified: Secondary | ICD-10-CM

## 2012-05-22 ENCOUNTER — Ambulatory Visit: Payer: Medicare Other | Admitting: Cardiovascular Disease

## 2012-05-29 ENCOUNTER — Telehealth: Payer: Self-pay | Admitting: Family Medicine

## 2012-05-29 NOTE — Telephone Encounter (Signed)
[name] from PT called to report that pt would like to do PT on her own.  They will d/c her and she will call them she needs assistance.

## 2012-06-07 ENCOUNTER — Telehealth: Payer: Self-pay

## 2012-06-07 MED ORDER — CARVEDILOL 12.5 MG PO TABS: 12.5000 mg | ORAL_TABLET | Freq: Two times a day (BID) | ORAL | Status: DC

## 2012-06-07 NOTE — Telephone Encounter (Signed)
Would encourage Lasix 20 mg twice a day Close monitoring of blood pressure and weight In the past she has had problem with too much fluid intake Would make sure her fluid intake is low Would like to see her in clinic if possible

## 2012-06-07 NOTE — Telephone Encounter (Signed)
FYI

## 2012-06-07 NOTE — Telephone Encounter (Signed)
I received t/c from [name] with [facility] She is in pt's home now for CHF management Says when they last saw pt 12/30, she was euvolemic, med box was filled and pt was compliant Today she has 3+pitting edema in LE, productive cough (clear sputum), rhonchi throughout lung fields BP=190/90, HR=108 in atrial fib Pt is out of coreg (unsure as to how long she has been without), out of metformin, has taken hydralazine today and lasix 20 mg  Pt lives at home with her son but is unsure where son is and when he will return to home Says ppl have been bringing pt food, so unsure as to amt of Na intake she has been getting [name] is contemplating sending pt to ER I advised ok to have pt take 2 extra furosemide now and another hydralazine I made appt with Dr. Mariah Milling for tomm as well Pt is refusing appt d/t a death in the family [name] explained to pt it is either come in tomm or send to ER now. Pt is hesitant and is contemplating both [name] says to leave appt as scheduled for tomm, but amy will continue to plead with pt to go to ER now. She will call EMS for pt If she doesn't go to ER, she will have pt take lasix 40 mg now with hydralazine I will also call in refill for coreg and [name] will pick this up and have her take as well

## 2012-06-08 ENCOUNTER — Telehealth: Payer: Self-pay

## 2012-06-08 ENCOUNTER — Other Ambulatory Visit: Payer: Self-pay | Admitting: *Deleted

## 2012-06-08 ENCOUNTER — Ambulatory Visit: Payer: Medicare Other | Admitting: Family Medicine

## 2012-06-08 ENCOUNTER — Ambulatory Visit: Payer: Medicare Other | Admitting: Cardiovascular Disease

## 2012-06-08 ENCOUNTER — Observation Stay: Payer: Self-pay | Admitting: Internal Medicine

## 2012-06-08 LAB — COMPREHENSIVE METABOLIC PANEL
Albumin: 3.6 g/dL (ref 3.4–5.0)
Alkaline Phosphatase: 114 U/L (ref 50–136)
BUN: 18 mg/dL (ref 7–18)
Calcium, Total: 9 mg/dL (ref 8.5–10.1)
Creatinine: 1.53 mg/dL — ABNORMAL HIGH (ref 0.60–1.30)
EGFR (African American): 35 — ABNORMAL LOW
EGFR (Non-African Amer.): 30 — ABNORMAL LOW
Potassium: 3.8 mmol/L (ref 3.5–5.1)
SGPT (ALT): 13 U/L (ref 12–78)

## 2012-06-08 LAB — URINALYSIS, COMPLETE
Bacteria: NONE SEEN
Bilirubin,UR: NEGATIVE
Blood: NEGATIVE
Glucose,UR: NEGATIVE mg/dL (ref 0–75)
Ketone: NEGATIVE
Leukocyte Esterase: NEGATIVE
Nitrite: NEGATIVE
Ph: 7 (ref 4.5–8.0)
Protein: NEGATIVE
Specific Gravity: 1.005 (ref 1.003–1.030)
Squamous Epithelial: <1

## 2012-06-08 LAB — CBC
MCH: 32.2 pg (ref 26.0–34.0)
MCV: 95 fL (ref 80–100)
Platelet: 399 10*3/uL (ref 150–440)
RBC: 4.02 10*6/uL (ref 3.80–5.20)
WBC: 8.9 10*3/uL (ref 3.6–11.0)

## 2012-06-08 LAB — TROPONIN I: Troponin-I: 0.73 ng/mL — ABNORMAL HIGH

## 2012-06-08 NOTE — Telephone Encounter (Signed)
Pts caregiver called to say pt is to come in this afternoon, but pt is c/o extreme weakness adn pain in all extreme ties associated with weakness and fatigue. Caregiver feels pt should go to ER. I advised go ahead and call EMS to take her (see yesterday's telephone note) We will cancel appt for today Understanding verb

## 2012-06-08 NOTE — Telephone Encounter (Signed)
Has appt with [place] today

## 2012-06-09 DIAGNOSIS — R7989 Other specified abnormal findings of blood chemistry: Secondary | ICD-10-CM

## 2012-06-09 LAB — BASIC METABOLIC PANEL
Calcium, Total: 8.9 mg/dL (ref 8.5–10.1)
Co2: 31 mmol/L (ref 21–32)
Creatinine: 1.52 mg/dL — ABNORMAL HIGH (ref 0.60–1.30)
EGFR (African American): 36 — ABNORMAL LOW
EGFR (Non-African Amer.): 31 — ABNORMAL LOW
Glucose: 176 mg/dL — ABNORMAL HIGH (ref 65–99)
Potassium: 4 mmol/L (ref 3.5–5.1)

## 2012-06-09 LAB — TROPONIN I: Troponin-I: 0.48 ng/mL — ABNORMAL HIGH

## 2012-06-09 LAB — CK TOTAL AND CKMB (NOT AT ARMC)
CK, Total: 26 U/L (ref 21–215)
CK-MB: <0.5 ng/mL — ABNORMAL LOW (ref 0.5–3.6)

## 2012-06-10 DIAGNOSIS — R079 Chest pain, unspecified: Secondary | ICD-10-CM

## 2012-06-12 ENCOUNTER — Other Ambulatory Visit: Payer: Self-pay | Admitting: *Deleted

## 2012-06-12 MED ORDER — METFORMIN HCL ER (MOD) 500 MG PO TB24: 500.0000 mg | ORAL_TABLET | Freq: Two times a day (BID) | ORAL | Status: DC

## 2012-06-13 ENCOUNTER — Ambulatory Visit: Payer: Medicare Other | Admitting: Family Medicine

## 2012-06-13 ENCOUNTER — Telehealth: Payer: Self-pay | Admitting: *Deleted

## 2012-06-13 NOTE — Telephone Encounter (Signed)
Advised [name]  

## 2012-06-13 NOTE — Telephone Encounter (Signed)
Yes please do order social work consult.

## 2012-06-13 NOTE — Telephone Encounter (Signed)
Nurse with [facility] been seeing pt for CHF management then pt ended up going into hospital for observation. Pt not admitted, and returned home. Nurse is concerned because pt has gone into a skip pattern before going to hospital, and pt's son has refused several visits. Nurse wants to make sure you will continue signing orders for pt and if you would possibly consider a social work consult because "there are several things going on in that home". Fyi nurse had left a message with Dr Mariah Milling last week becaue pt was having cardiac issues, but had refused to go to ER.

## 2012-06-13 NOTE — Telephone Encounter (Signed)
Advised nurse [name].  She's also asking for extension of PT.  She's very concerned about the patient and a lack of cooperation from the family regarding following advise for patient's care.

## 2012-06-13 NOTE — Telephone Encounter (Signed)
Yes ok to have extension.

## 2012-06-15 DIAGNOSIS — E1149 Type 2 diabetes mellitus with other diabetic neurological complication: Secondary | ICD-10-CM

## 2012-06-15 DIAGNOSIS — G909 Disorder of the autonomic nervous system, unspecified: Secondary | ICD-10-CM

## 2012-06-15 DIAGNOSIS — I6529 Occlusion and stenosis of unspecified carotid artery: Secondary | ICD-10-CM

## 2012-06-16 ENCOUNTER — Telehealth: Payer: Self-pay | Admitting: Family Medicine

## 2012-06-16 NOTE — Telephone Encounter (Signed)
Since her kidney function is elevated and she is followed by a cardiologist, I would prefer him to make those decisions.  Please forward to Dr. Dossie Arbour.

## 2012-06-16 NOTE — Telephone Encounter (Signed)
[name] from [facility] called to let Dr. Dayton Martes know that pt is now at home.  BP 120/60.  She is having +2 edema in LLL and +1 edema in RLL.  Weight today 130lbs.  Cr 1.52, BUN 24.  [name] would like an order for telemonitoring system to measure BP, pulse, weight.  She is also worried that pt's RX for Furosemide 20mg  is not enough and would like to increase it.  She will be doing another home visit on Monday.  Please advise.

## 2012-06-19 ENCOUNTER — Ambulatory Visit: Payer: Medicare Other | Admitting: Cardiovascular Disease

## 2012-06-19 ENCOUNTER — Telehealth: Payer: Self-pay | Admitting: *Deleted

## 2012-06-19 ENCOUNTER — Other Ambulatory Visit: Payer: Self-pay

## 2012-06-19 DIAGNOSIS — I509 Heart failure, unspecified: Secondary | ICD-10-CM

## 2012-06-19 MED ORDER — FUROSEMIDE 20 MG PO TABS: ORAL_TABLET | ORAL | Status: DC

## 2012-06-19 NOTE — Telephone Encounter (Signed)
Would make sure she is off digoxin Level in dec 2013 was high In terms of edema, echo 03/2012 does show elevated right heart pressures. Could increase lasix to 20 mg daily, twice a day every other day BMP check in 2 weeks  Would make sure she is on schedule for repeat visit several days after labs Ok to order home nursing for [facility] if needed

## 2012-06-19 NOTE — Telephone Encounter (Signed)
Spoke with [name] from [facility] today and she mentioned that patient had gained 5 lbs when weighed on 06/16/2012, her vitals have been good/ no distress.  Pt rt side lung sounds course and is not sure if it is from COPD or normal for her being that she has only seen her a few times. Pt comes in 06/21/2012 to see [facility] and she just wanted to inform him that way if he may want to order chest x-ray he would be aware that her lungs did sound a little course and she has never heard her lungs sound that way.

## 2012-06-19 NOTE — Telephone Encounter (Signed)
[name] with [facility] notified Understanding verb Will check BMP in 2 weeks at home visit Has appt with [place] in 2 days Nurse is concerned about pt's home status; has SW involved d/t concerning issues with son/grand daughter that live with her; pt says she has been giving son/grand daughter checks for her "bills" to pay bills but bills are not getting paid; pt requesting to go back to assisted living; SW is on the case

## 2012-06-20 NOTE — Telephone Encounter (Signed)
lmtcb

## 2012-06-21 ENCOUNTER — Encounter: Payer: Self-pay | Admitting: Cardiovascular Disease

## 2012-06-21 ENCOUNTER — Ambulatory Visit (INDEPENDENT_AMBULATORY_CARE_PROVIDER_SITE_OTHER): Payer: Medicare Other | Admitting: Cardiovascular Disease

## 2012-06-21 VITALS — BP 110/58 | HR 99 | Ht 65.0 in | Wt 140.5 lb

## 2012-06-21 DIAGNOSIS — I2510 Atherosclerotic heart disease of native coronary artery without angina pectoris: Secondary | ICD-10-CM

## 2012-06-21 DIAGNOSIS — I10 Essential (primary) hypertension: Secondary | ICD-10-CM

## 2012-06-21 DIAGNOSIS — I5032 Chronic diastolic (congestive) heart failure: Secondary | ICD-10-CM

## 2012-06-21 DIAGNOSIS — I4891 Unspecified atrial fibrillation: Secondary | ICD-10-CM

## 2012-06-21 DIAGNOSIS — R609 Edema, unspecified: Secondary | ICD-10-CM

## 2012-06-21 DIAGNOSIS — I495 Sick sinus syndrome: Secondary | ICD-10-CM

## 2012-06-21 MED ORDER — ATORVASTATIN CALCIUM 40 MG PO TABS: 40.0000 mg | ORAL_TABLET | Freq: Every day | ORAL | Status: DC

## 2012-06-21 MED ORDER — CLONIDINE HCL 0.2 MG PO TABS: 0.2000 mg | ORAL_TABLET | Freq: Two times a day (BID) | ORAL | Status: DC

## 2012-06-21 MED ORDER — CARVEDILOL 25 MG PO TABS: 25.0000 mg | ORAL_TABLET | Freq: Two times a day (BID) | ORAL | Status: DC

## 2012-06-21 NOTE — Patient Instructions (Addendum)
You are doing well. Please increase the coreg to 25 mg twice a day (up from 12.5 mg twice a day) Hold the hydralazine  Monitor your blood pressure  Take extra lasix after lunch only for worsening leg swelling on the left leg   Please call us if you have new issues that need to be addressed before your next appt.  Your physician wants you to follow-up in: 3 months.

## 2012-06-21 NOTE — Assessment & Plan Note (Signed)
Currently with no symptoms of angina. No further workup at this time. Continue current medication regimen. 

## 2012-06-21 NOTE — Assessment & Plan Note (Signed)
No shortness of breath. Edema of the left lower extremity. No dramatic weight gain, in fact 5 pound weight loss since her last clinic visit.

## 2012-06-21 NOTE — Assessment & Plan Note (Signed)
We will hold her hydralazine. She does report having some dizziness. We'll increase Coreg, hold hydralazine to avoid a drop in her blood pressure. If she continues to have dizziness, we may need to cut back on her other medications.

## 2012-06-21 NOTE — Progress Notes (Signed)
Patient ID: [name], female    DOB: 1923/08/16, 79 y.o.   MRN: [id number]  HPI Comments: [name] is a very pleasant 79 year old woman with past medical history of coronary artery disease, stent placed to her left circumflex in 2003 which was patent on catheterization in 2008, history of peripheral vascular disease with bilateral carotid disease, 79% of the right internal carotid, 40-59% left internal carotid artery, also history of COPD, history of respiratory failure in August 2009 secondary to rapid atrial fibrillation and diastolic relaxation abnormality, history of sick sinus syndrome with ablation in May of 2009 with discontinuation of Coumadin in September of 09 secondary to bleeding.   hospital admission from October 10 to March 03, 2011 for pneumonia. Gentle diuresis was started she had acute renal failure. She had mildly elevated troponin which was secondary to demand ischemia. Normal LV function on echocardiogram with mild MR and TR.  CT Scan in the hospital showed high-grade stenosis at the origin and proximal portion of the innominate artery, high-grade stenosis of the right carotid bifurcation, moderate stenosis of the proximal prevertebral left subclavian artery.   hospital admission x2 for abdominal pain, diagnosis of mesenteric disease, possible mild mesenteric ischemia. She reports that the second episode to the hospital was from a episode of constipation with bleeding. She was diagnosed with diverticuli. Her Lasix was held after discharge from the hospital. As an outpatient, she had worsening edema and she was drinking significant fluids. Lasix was restarted.  hospital admission for dizziness. Etiology was unclear. Creatinine on arrival was 1.6. She was seen by cardiology. No intervention performed. Poor gait, weak legs.  Blood pressure was low and clonidine dose was decreased.  [facility].   She has chronic edema of the left lower extremity,  always worse than the right side. She is currently at home after recent hospital admission 06/08/2012 for headache and weakness. Found to have borderline elevated cardiac enzymes, felt to be from diastolic CHF in the setting of renal failure. She has a visiting nurse with recent calls noting lower extremity edema. She reports that her edema has been relatively stable, slightly worse of the left ankle. Creatinine in the hospital was 1.5, peak troponin 0.6. Her Coreg was increased to 25 mg twice a day in the hospital. She has only been taking 12.5 mg daily. She does have occasional dizzy spells.    history of knee replacement bilaterally over 10 years ago. Chronic pain in her knees limits her ability to ambulate  EKG shows normal sinus rhythm with frequent APCs, rate 99 beats per minute   Outpatient Encounter Prescriptions as of 06/21/2012  Medication Sig Dispense Refill  . acetaminophen (TYLENOL EX ST ARTHRITIS PAIN) 500 MG tablet Take 1-2 tablets (500-1,000 mg total) by mouth every 6 (six) hours as needed for pain.      [name] aspirin 81 MG tablet Take 81 mg by mouth daily.      [name] atorvastatin (LIPITOR) 40 MG tablet Take 1 tablet (40 mg total) by mouth daily.  90 tablet  3  . carvedilol (COREG) 12.5 MG tablet Take 1 tablet (12.5 mg total) by mouth 2 (two) times daily with a meal. Was supposed to take Coreg 25 mg twice a day at the time of discharge from the hospital   180 tablet  3  . cloNIDine (CATAPRES) 0.2 MG tablet Take 1 tablet (0.2 mg total) by mouth 2 (two) times daily.  180 tablet  3  . docusate sodium (COLACE)  100 MG capsule Take 100 mg by mouth daily.      . feeding supplement (ENSURE COMPLETE) LIQD Take 237 mLs by mouth 3 (three) times daily between meals.      . furosemide (LASIX) 20 MG tablet Take 20 mg by mouth daily.      . hydrALAZINE (APRESOLINE) 25 MG tablet Take 25 mg by mouth 3 (three) times daily.      [name] levalbuterol (XOPENEX HFA) 45 MCG/ACT inhaler Inhale 2 puffs into the lungs as  needed.      . metFORMIN (GLUMETZA) 500 MG (MOD) 24 hr tablet Take 1 tablet (500 mg total) by mouth 2 (two) times daily with a meal.  60 tablet  2  . mirtazapine (REMERON) 30 MG tablet Take 30 mg by mouth at bedtime.      . nitroGLYCERIN (NITROSTAT) 0.4 MG SL tablet Place 0.4 mg under the tongue every 5 (five) minutes as needed. For chest pain      . tiotropium (SPIRIVA) 18 MCG inhalation capsule Place 18 mcg into inhaler and inhale daily.        Review of Systems  Constitutional: Negative.   HENT: Negative.   Eyes: Negative.   Respiratory: Negative.   Cardiovascular: Positive for leg swelling.  Gastrointestinal: Negative.   Musculoskeletal: Positive for gait problem.  Skin: Negative.   Neurological: Negative.   Hematological: Negative.   Psychiatric/Behavioral: Negative.   All other systems reviewed and are negative.    BP 110/58  Pulse 99  Ht 5\' 5"  (1.651 m)  Wt 140 lb 8 oz (63.73 kg)  BMI 23.38 kg/m2  Physical Exam  Nursing note and vitals reviewed. Constitutional: She is oriented to person, place, and time. She appears well-developed and well-nourished.  HENT:  Head: Normocephalic.  Nose: Nose normal.  Mouth/Throat: Oropharynx is clear and moist.  Eyes: Conjunctivae normal are normal. Pupils are equal, round, and reactive to light.  Neck: Normal range of motion. Neck supple. No JVD present.  Cardiovascular: Normal rate, regular rhythm, S1 normal, S2 normal and intact distal pulses.  Exam reveals no gallop and no friction rub.   Murmur heard.  Systolic murmur is present with a grade of 2/6       Trace pitting edema on the left, greater than the right, compression hose in place  Pulmonary/Chest: Effort normal and breath sounds normal. No respiratory distress. She has no wheezes. She has no rales. She exhibits no tenderness.  Abdominal: Soft. Bowel sounds are normal. She exhibits no distension. There is no tenderness.  Musculoskeletal: Normal range of motion. She exhibits  no edema and no tenderness.  Lymphadenopathy:    She has no cervical adenopathy.  Neurological: She is alert and oriented to person, place, and time. Coordination normal.  Skin: Skin is warm and dry. No rash noted. No erythema.  Psychiatric: She has a normal mood and affect. Her behavior is normal. Judgment and thought content normal.         Assessment and Plan

## 2012-06-21 NOTE — Assessment & Plan Note (Signed)
Tachycardic on today's visit. Was also tachycardic in the hospital. History normal sinus rhythm on today's visit with frequent APCs. We will increase the carvedilol to 25 mg twice a day. This was done previously in the hospital but she reports being on 12.5 mg twice a day.

## 2012-06-21 NOTE — Assessment & Plan Note (Signed)
Trace pitting edema on the left, minimal edema on the right. She reports is perhaps slightly more than normal. She's not 3 concerned with no other symptoms of heart failure. Weight down 5 pounds from December 2013.

## 2012-06-22 NOTE — Telephone Encounter (Signed)
Pt came in for OV 2/5

## 2012-06-27 ENCOUNTER — Telehealth: Payer: Self-pay

## 2012-06-27 NOTE — Telephone Encounter (Signed)
I received t/c from [name] with [facility] She says she is at pt's home now and pt has not been taking her diuretics as prescribed Says she has not been taking them at all Has 3+ Pitting edema in LE No acute distress [name] is getting a BMP and asks if ok to get BNP as well I advised this is ok Says weight is up 5 pounds She is ordering tele monitoring service for pt so she can be monitored from [facility] electronically [id number] etc Says son lives in home with pt but tells [facility] pt is not home when she really is home. There are some financial issues occuring within home and $ is "going missing" per patient. [name] is referring to SW for possible intervention from Adult Protective Services  [name] is going to have pt take lasix 40 mg today, tomm and thursday then 20 mg daily. She filled her pill box accordingly We will await lab results and will call [name] back with further instructions after Dr. Mariah Milling reviews. She is going back to see pt in 2-3 days

## 2012-06-27 NOTE — Telephone Encounter (Signed)
FYI

## 2012-06-28 ENCOUNTER — Other Ambulatory Visit: Payer: Self-pay | Admitting: *Deleted

## 2012-06-28 DIAGNOSIS — I509 Heart failure, unspecified: Secondary | ICD-10-CM

## 2012-07-03 NOTE — Telephone Encounter (Signed)
Seen on 06/21/12 At that time, she had Trace pitting edema on the left, minimal edema on the right.  Note from visiting nurse prior to that visit had reported 2+ edema and the need for more lasix. I did not agree based on visit 2/5 If we do lasix 40 mg daily, would only do for a few days then back down to normal dose of lasix 20 mg daily Not all her edema is CHF Would monitor weight. On 06/21/12 in office,  Weight was down 5 pounds from December 2013.

## 2012-07-03 NOTE — Telephone Encounter (Signed)
Per [facility], they are faxing results

## 2012-07-03 NOTE — Telephone Encounter (Signed)
[name] with [facility] called back to say she is on her way to see pt now Says she called pt to tell her she is coming  Says pt was not sob but did c/o LE edema [name] will assess when she gets there and will call us back with findings  We still have not received BNP results from [facility]. We will call them to get these results as well Otherwise, will await t/c from [name] to report

## 2012-07-03 NOTE — Telephone Encounter (Signed)
lmtcb on amy, [facility] nurse's, vm

## 2012-07-04 ENCOUNTER — Telehealth: Payer: Self-pay

## 2012-07-04 NOTE — Telephone Encounter (Signed)
I received voicemail from [name] with [facility]. Says she went to see pt yesterday in home Says pt's weight is at baseline at 135 pounds but she still has 3+ pitting edema in LE Right lung lobe is "coarse, but LUL is clear Pt continues to smoke Asks if we want her to check labs, get portable CXR in home or have pt come in for office visit BP=138/70, o2=96 %, temp=98.4 She asks that I call her back at [phone] with response

## 2012-07-05 NOTE — Telephone Encounter (Signed)
Not particularly concerned about edema if weight is stable You could certainly bring her in for a nurse visit that she was recently seen February 5 with stable edema and stable weight Cannot push diuretic given renal dysfunction and renal failure with overdiuresis Would try compression hose as I suspect she has venous insufficiency Not all edema is fluid overload Would continue to monitor weight rather than edema as a guide for diuretic use

## 2012-07-06 NOTE — Telephone Encounter (Signed)
[name] with [facility] was informed Says she is going back to home today and will call us back with findings

## 2012-07-11 ENCOUNTER — Telehealth: Payer: Self-pay

## 2012-07-11 NOTE — Telephone Encounter (Signed)
Please see below [id number] and BP and advise thanks

## 2012-07-11 NOTE — Telephone Encounter (Signed)
[name] with [facility] called to say she went to visit pt this am and BP was 160/80. Pt took morning meds as usual and RN left home RN got a call from SW saying she was at pt's home and opt was lethargic. RN asked SW to check BP. Pt's BP was 107/60  RN wonders if we need to change BP meds since she is taking: Hydralazine 25 mg BID Clonidine 0.2 mg BID Coreg 25 mg BID Furosemide 20 mg daily alternating with 40 mg QOD  Weight is down 6 pounds 129 pounds I advised to decrease lasix to 20 mg daily and I will talk with Dr. Mariah Milling about BP meds Understanding verb

## 2012-07-12 ENCOUNTER — Emergency Department: Payer: Self-pay | Admitting: Emergency Medicine

## 2012-07-12 LAB — COMPREHENSIVE METABOLIC PANEL
BUN: 28 mg/dL — ABNORMAL HIGH (ref 7–18)
Bilirubin,Total: 0.2 mg/dL (ref 0.2–1.0)
Calcium, Total: 8.6 mg/dL (ref 8.5–10.1)
Chloride: 100 mmol/L (ref 98–107)
Creatinine: 1.56 mg/dL — ABNORMAL HIGH (ref 0.60–1.30)
EGFR (Non-African Amer.): 30 — ABNORMAL LOW
Glucose: 113 mg/dL — ABNORMAL HIGH (ref 65–99)
SGPT (ALT): 15 U/L (ref 12–78)
Total Protein: 7.7 g/dL (ref 6.4–8.2)

## 2012-07-12 LAB — URINALYSIS, COMPLETE
Bilirubin,UR: NEGATIVE
Ketone: NEGATIVE
Leukocyte Esterase: NEGATIVE
Nitrite: NEGATIVE
Ph: 5 (ref 4.5–8.0)
Specific Gravity: 1.009 (ref 1.003–1.030)
WBC UR: 1 /HPF (ref 0–5)

## 2012-07-12 LAB — CBC
MCH: 30.4 pg (ref 26.0–34.0)
MCHC: 32 g/dL (ref 32.0–36.0)
MCV: 95 fL (ref 80–100)
Platelet: 339 10*3/uL (ref 150–440)
RDW: 16.3 % — ABNORMAL HIGH (ref 11.5–14.5)

## 2012-07-12 NOTE — Telephone Encounter (Signed)
According to VS flowsheet in ER, BPs were as follows: 132/63 141/61 112/54 120/57 104/46

## 2012-07-12 NOTE — Telephone Encounter (Signed)
[name] with [facility] called our FD to let us know pt reported to [name] that she was taken to ER last night for "blood pressure" Pt reports BP was "high" but we do not have these records [name] would like to make a visit to pt's home today but wants our advice re:BP meds I will try to get VS from ER visit and discuss with Dr. Mariah Milling and call [name] back at [phone]

## 2012-07-12 NOTE — Telephone Encounter (Signed)
Would back down on Lasix to 20 mg daily as she could be getting dry doing 40 mg every other day This happen before and could cause dizziness Would not go by her edema, would go by her weight In fact would probably hold Lasix for 2 days before restarting

## 2012-07-12 NOTE — Telephone Encounter (Signed)
[name] says she has been by pt's home this am and was told by pt she went to ER because her BP was "too high" and she was dizzy. I explained, based on ER VS flow sheet, BP never got above 140 mm hg SBP Says pt has taken meds as prescribed  Because of pt's dizziness throughout day and orthostasis, I advised [name] to have pt hold hydralazine to see if this helps her symptoms Understanding verb She is going back to home tomm or Friday and will call us with an update

## 2012-07-13 NOTE — Telephone Encounter (Signed)
Already decreased lasix to 20 mg daily

## 2012-07-13 NOTE — Telephone Encounter (Signed)
[name] informed Understanding verb She will assess pt tomm and call us with any concerns

## 2012-08-02 ENCOUNTER — Telehealth: Payer: Self-pay

## 2012-08-02 NOTE — Telephone Encounter (Signed)
I called [facility] and results are not ready yet

## 2012-08-02 NOTE — Telephone Encounter (Signed)
I received a t/c from [name] with [facility] stating she is visiting pt in home today and resting HR=120 BPM BP=128/80 prior to meds Says HR was documented last week to be 130 BPM by another RN but we were not notified Asymptomatic Taking clonidine 0.2 mg BID and coreg 25 mg BID [name] just gave pt her meds a few minutes ago Pt c/o dizziness upon standing Advised ok to decrease clonidine to 0.1 mg BID for this [name] says HR is regular, does not seem to be in atrial fib Has appt with Dr. Ladona Ridgel 4/10 for device check and visit I advised ok to take an extra 1/2 tablet carvedilol if HR does not improve with usual dose [name] is going to check CBC and BMP today while in home These results will be available this pm Will discuss with Dr. Mariah Milling

## 2012-08-03 ENCOUNTER — Other Ambulatory Visit: Payer: Self-pay

## 2012-08-03 DIAGNOSIS — R000 Tachycardia, unspecified: Secondary | ICD-10-CM

## 2012-08-03 NOTE — Telephone Encounter (Signed)
I received t/c from [name] with [facility] Says she was at pts home at 1:11 pm today and HR was 140 BPM She encouraged pt to go to ER but pt refused Says pt had taken meds as prescribed BP=110/70, HR=140 BPM and was regular Pt denies worsening sob or CP Says she was going to "lay down" after nurse left  I discussed this with Dr. Mariah Milling and also reviewed labs with him He advised pt come in for EKG ASAP if she is not going to ER I called [name] with [facility] who says they use a company that does EKGs out of pt's home. It is [facility] and can come to pts home tonight  I called them and they ask that I fax demographics and order for EKG and they will get to this ASAP I called pt to warn her and offered to have her come in as well She declines and would rather have [facility] come to home tonight for EKG  I have faxed order and demographics to fax # provided and will call pt in am to reassess [name] also aware of plan She also advises I call Gwynn, who is pt's transportation if needed at [phone]

## 2012-08-03 NOTE — Telephone Encounter (Signed)
Labs received  Will review with Dr. Mariah Milling

## 2012-08-04 ENCOUNTER — Telehealth: Payer: Self-pay

## 2012-08-04 ENCOUNTER — Other Ambulatory Visit: Payer: Self-pay

## 2012-08-04 MED ORDER — DIGOXIN 125 MCG PO TABS: 0.1250 mg | ORAL_TABLET | ORAL | Status: AC

## 2012-08-04 MED ORDER — CLONIDINE HCL 0.2 MG PO TABS: 0.1000 mg | ORAL_TABLET | Freq: Two times a day (BID) | ORAL | Status: DC

## 2012-08-04 MED ORDER — DILTIAZEM HCL ER COATED BEADS 120 MG PO CP24: 120.0000 mg | ORAL_CAPSULE | Freq: Every day | ORAL | Status: DC

## 2012-08-04 NOTE — Telephone Encounter (Signed)
[name] with [facility] made aware of situation She will go to pts home this am as well to check HR and call me back

## 2012-08-04 NOTE — Telephone Encounter (Signed)
I received EKG from [facility] It shows ?atrial flutter at a rate of 141 BPM, per Dr. Windell Hummingbird interpretation "Start digoxin 0.125 mg QOD. Start diltiazem CD 120 mg daily.  Hold hydralazine. Decrease clonidine to 0.1 mg BID.  Call to reassess Monday 08/07/12. " VO Dr. Alvis Lemmings, RN  RX sent to [facility]

## 2012-08-04 NOTE — Telephone Encounter (Signed)
I called [facility] and they tell me they have not receive EKG from "tech" and will be later before they can fax this I explained we need this ASAP He will send message to technician

## 2012-08-04 NOTE — Telephone Encounter (Signed)
I have not received EKG from last night I called pt and spoke with pt's son who says EKG was obtained last night Pt is still asleep and says she slept well I will call [facility] to get results

## 2012-08-04 NOTE — Telephone Encounter (Signed)
[name] called back, says she called [facility] and they told her the "tech does not come in until tonight and that is who has the EKG strip" [name] advised them to get in touch with "tech" NOW and have her fax [place] EKG ASAP I will look for EKG and if not received by 2:40 PM, we will have to bring pt in, send her to ER  Dr. Mariah Milling wants EKG to see what rhythm pt is in  [name] is giving pt am meds now

## 2012-08-04 NOTE — Telephone Encounter (Signed)
[name] with [facility] called to say she is in pt's home and HR=136 BPM Pt is asymptomatic other than some fatigue BP=120/86 Has not taken am meds  Needs to take coreg 25 mg and clonidine I explained I still have not received EKG from [facility] [name] is calling them now

## 2012-08-04 NOTE — Telephone Encounter (Signed)
Pt informed of changes RX sent to pharmacy I will call [name] with [facility] to inform her of changes

## 2012-08-04 NOTE — Telephone Encounter (Signed)
I have not received EKG I called [facility] and spoke with Tameka She says EKG rhythm strips are sometimes left in the home but she will check with tech to see if she has it I explained pt may need to go to ER and should have had this done and faxed yesterday!!  She placed me on hiold and tells me tech has strip and is "on her way to office and will fax this as soon as she gest in. Give me 15 moniutes"

## 2012-08-04 NOTE — Telephone Encounter (Signed)
[name] informed She will contact caregiver [name] to make her aware of changes, who will help her with pill box She says she will also reassess tomm

## 2012-08-04 NOTE — Telephone Encounter (Signed)
I called pharmacy who says they do not deliver anymore until 3/24 I called pt who says she has someone there now who can pick up her meds for her before 6 pm tonight She was instructed as to how to take the medications correctly and verb. understanding

## 2012-08-04 NOTE — Telephone Encounter (Signed)
I called [name] to inform her of situation She says she urged pt to go to ER but pt refuses I called pt who says she is feeling fine, eating sherbert Denies symptoms of fatigue or SOB I advised her to go to ER if her HR is 140 BPM She says she is not going but will go over w/e if she begins to feel "worse"

## 2012-08-07 ENCOUNTER — Telehealth: Payer: Self-pay

## 2012-08-07 NOTE — Telephone Encounter (Signed)
[name] with [facility] left v/m; pt has had elevated heart rate for the last week averaging in 140s; Dr Mariah Milling is aware and did EKG last week; pt is taking Digoxin and Diltiazem; just wanted Dr Dayton Martes to be aware. [name] has requested diabetic supplies from [employer]. [employer] will send form for diabetic supplies to Dr Dayton Martes to be filled out.

## 2012-08-07 NOTE — Telephone Encounter (Signed)
I called [name] with [facility] who says we may want to try calling Gwyn, pt's caregiver, to see if she can persuade pt to go to ER/call 911 [name] also mentions pt's weight is up 5 pounds I am afraid pt is dealing with CHF exacerbation, made worse by atrial flutter with RVR I will first call Gwyn, then will call pt back to strongly suggest she go to ER

## 2012-08-07 NOTE — Telephone Encounter (Signed)
I called pt's home and spoke with [name] with [facility] who is there to assess pt Says HR this am is 140 BPM Pt is unsure if she took digoxin as prescribed (doesnt think she has taken this at all), thinks she did take diltiazem as we prescribed [name] gave her digoxin and diltiazem this am I advised pt, if she has taken meds as prescribed, and HR still elevated she needs to go to ER Pt tells [name] she will wait until tomm if "no better" I will call pt back in 2 hours to reassess HR, amy confirms pt has a home BP cuff that monitors HR and BP and will make sure pt know show to use this before she leaves i will call back in a few hours to reassess HR

## 2012-08-07 NOTE — Telephone Encounter (Signed)
I called pt to assess HR She says she has not checked this yet Son is not in home right now Says she knows how to take this. I asked her to check it while I am on phone She says she will "check it after while" I told her I would call her back in 1 hour to get report of HR Understanding verb

## 2012-08-07 NOTE — Telephone Encounter (Signed)
[name] informed and updated I will call pt and reasses tomm am first thing

## 2012-08-07 NOTE — Telephone Encounter (Signed)
[name] with [facility] calling with update HR .Please call

## 2012-08-07 NOTE — Telephone Encounter (Signed)
I spoke with [name] who says pt will not listen to her either I called pt again and encouraged her to let me call 911 She says she will go tomm if she still feels bad but she is "ready to meet my Maker and have been ready for a long time" I asked if she would go tomm if we feel she needs to go. She says only after [name] comes to assess her in the am will she go I will call [name] again

## 2012-08-07 NOTE — Telephone Encounter (Signed)
Assess HR

## 2012-08-07 NOTE — Telephone Encounter (Signed)
I called pt back She says HR=137 BPM I explained she needs to go to ER She refuses She sounds audibly congested in lungs over the phone I stressed to her the importance of letting me call 911 for her, that she cannot stay in this fast rhythm very long without danger She understands but refuses to let me call 911/go to ER I will call her back in am i am also calling [name] with [facility] to inform her of update

## 2012-08-08 ENCOUNTER — Telehealth: Payer: Self-pay | Admitting: *Deleted

## 2012-08-08 NOTE — Telephone Encounter (Signed)
LMTCB again on home #

## 2012-08-08 NOTE — Telephone Encounter (Signed)
Called pt's caregiver, gwyn, to see if she can check on pt for me No answer Will call amy with [facility]

## 2012-08-08 NOTE — Telephone Encounter (Signed)
I spoke with [name] with [facility] who says she will keep attempting to reach pt as well I will keep trying home phone #

## 2012-08-08 NOTE — Telephone Encounter (Signed)
lmtcb

## 2012-08-08 NOTE — Telephone Encounter (Signed)
I attempted to reach pt to assess HR Son answered phone, says pt is asleep and HR is "ok" I will call back at 1100 to reassess

## 2012-08-08 NOTE — Telephone Encounter (Signed)
Form for diabetic supplies is on your desk.  I dont see diagnosis of diabetes on pt's problem list but home health nurse states pt takes metformin and has been told to check her blood sugar once a day.  She does want supplies from this company.  Nurse is concerned about patient.  Her heart rate has been running in the 140's and pt seems unconcerned to do anything about it, told nurse she is ready to meet her maker.

## 2012-08-08 NOTE — Telephone Encounter (Signed)
LMTCB

## 2012-08-09 NOTE — Telephone Encounter (Signed)
lmtcb on home # 

## 2012-08-09 NOTE — Telephone Encounter (Signed)
lmtcb

## 2012-08-09 NOTE — Telephone Encounter (Signed)
I called Gwyn who says she has not talked to pt today but she saw her yesterday and pt was in the bed all day I will keep trying family members

## 2012-08-09 NOTE — Telephone Encounter (Signed)
I am aware of her heart rates.  Reviewed Dr. Windell Hummingbird note.  He is aware and treating this.

## 2012-08-10 NOTE — Telephone Encounter (Signed)
lmtcb on [name]'s VM

## 2012-08-10 NOTE — Telephone Encounter (Signed)
I was able to speak with [name] who has been made aware of situation She will try to make a home visit tomm but if pt is refusing care/tx, there is not much more we can do She will let me know tomm how HR is doing

## 2012-08-10 NOTE — Telephone Encounter (Signed)
I was finally able to speak with pt She says she feels "alright" although she is unsure of her HR She does not check this at home Her voice is "gurgly" as if she is full of fluid  I told her we should bring her in for EKG at the least, but she says she does not "feel like it" I offered to call 911 for her She continues to refuse treatment/help of any kind I am unsure as to if she is compliant with her medications She says [facility] nurse has not been by and is unsure as to when she is coming I will call amy to give her an update  I made sure pt was aware she may be in trouble and may need hospitalization She understands and refuses tx

## 2012-08-10 NOTE — Telephone Encounter (Signed)
FYI See below as to what has been going on with pt She is refusing EKGs, ER visits, office visits, etc HR still in 140's despite med changes you made Do we offer Hospice?

## 2012-08-10 NOTE — Telephone Encounter (Signed)
Form faxed, sent for scanning.

## 2012-08-11 NOTE — Telephone Encounter (Signed)
Wonder if there is some family we could contact. Perhaps the nurse knows of some family

## 2012-08-14 NOTE — Telephone Encounter (Signed)
lmtcb

## 2012-08-15 NOTE — Telephone Encounter (Signed)
Pt scheduled for 4/;4 with [place]

## 2012-08-15 NOTE — Telephone Encounter (Signed)
I received VM from [name] with [facility] stating she is out at pt's home and HR=92 BPM and "very irregular" Says weight is up to 145 pounds (10 pounds over goal weight), has LE edema BP=140/90, O2 sats=95% on RA Confirms compliance with medications as prescribed She would like to have pt come to our office to be seen this Friday 4/3 I will call her back to arrange at [phone]

## 2012-08-18 ENCOUNTER — Telehealth: Payer: Self-pay

## 2012-08-18 ENCOUNTER — Encounter: Payer: Self-pay | Admitting: Cardiovascular Disease

## 2012-08-18 ENCOUNTER — Ambulatory Visit (INDEPENDENT_AMBULATORY_CARE_PROVIDER_SITE_OTHER): Payer: Medicare Other | Admitting: Cardiovascular Disease

## 2012-08-18 VITALS — BP 129/70 | HR 65 | Ht 65.0 in | Wt 149.5 lb

## 2012-08-18 DIAGNOSIS — R002 Palpitations: Secondary | ICD-10-CM

## 2012-08-18 DIAGNOSIS — I5032 Chronic diastolic (congestive) heart failure: Secondary | ICD-10-CM

## 2012-08-18 DIAGNOSIS — R000 Tachycardia, unspecified: Secondary | ICD-10-CM

## 2012-08-18 DIAGNOSIS — F17200 Nicotine dependence, unspecified, uncomplicated: Secondary | ICD-10-CM

## 2012-08-18 DIAGNOSIS — N289 Disorder of kidney and ureter, unspecified: Secondary | ICD-10-CM

## 2012-08-18 DIAGNOSIS — I10 Essential (primary) hypertension: Secondary | ICD-10-CM

## 2012-08-18 DIAGNOSIS — I4891 Unspecified atrial fibrillation: Secondary | ICD-10-CM

## 2012-08-18 DIAGNOSIS — R0602 Shortness of breath: Secondary | ICD-10-CM

## 2012-08-18 MED ORDER — FUROSEMIDE 40 MG PO TABS: 40.0000 mg | ORAL_TABLET | Freq: Two times a day (BID) | ORAL | Status: DC

## 2012-08-18 NOTE — Progress Notes (Signed)
Patient ID: [name], female    DOB: May 20, 1923, 79 y.o.   MRN: [id number]  HPI Comments: [name] is a very pleasant 79 year old woman with past medical history of coronary artery disease, stent placed to her left circumflex in 2003 which was patent on catheterization in 2008, history of peripheral vascular disease with bilateral carotid disease, 79% of the right internal carotid, 40-59% left internal carotid artery, also history of COPD, history of respiratory failure in August 2009 secondary to rapid atrial fibrillation and diastolic relaxation abnormality, history of sick sinus syndrome with ablation in May of 2009 with discontinuation of Coumadin in September of 09 secondary to bleeding.  Since her last clinic visit, weight is up 10 pounds. EKG several weeks ago showing atrial flutter with ventricular rate of 140 beats per minute. She did not want to go to the hospital. She was started on diltiazem 120 g daily, digoxin every other day, clonidine was decreased in dose and hydralazine was held. Worsening lower extremity edema, shortness of breath. She reports that today she has a girdle. When she breathes out some significant fatigue.  prior hospital admission October 10 to March 03, 2011 for pneumonia. Gentle diuresis was started she had acute renal failure. She had mildly elevated troponin which was secondary to demand ischemia.  Normal LV function on echocardiogram with mild MR and TR.  CT Scan in the hospital showed high-grade stenosis at the origin and proximal portion of the innominate artery, high-grade stenosis of the right carotid bifurcation, moderate stenosis of the proximal prevertebral left subclavian artery.   hospital admission x2 for abdominal pain, diagnosis of mesenteric disease, possible mild mesenteric ischemia. She reports that the second episode to the hospital was from a episode of constipation with bleeding. She was diagnosed with diverticuli. Her Lasix  was held after discharge from the hospital. As an outpatient, she had worsening edema and she was drinking significant fluids. Lasix was restarted.  hospital admission for dizziness. Etiology was unclear. Creatinine on arrival was 1.6. She was seen by cardiology. No intervention performed. Poor gait, weak legs.  Blood pressure was low and clonidine dose was decreased.  [facility].   She has chronic edema of the left lower extremity, always worse than the right side.   hospital admission 06/08/2012 for headache and weakness. diastolic CHF in the setting of renal failure.  Creatinine in the hospital was 1.5, peak troponin 0.6. Her Coreg was increased to 25 mg twice a day in the hospital.    history of knee replacement bilaterally over 10 years ago. Chronic pain in her knees limits her ability to ambulate  EKG shows normal sinus rhythm with, rate 65 beats per minute, nonspecific ST abnormality in leads V5, V6, 1 and aVL   Outpatient Encounter Prescriptions as of 08/18/2012  Medication Sig Dispense Refill  . acetaminophen (TYLENOL EX ST ARTHRITIS PAIN) 500 MG tablet Take 1-2 tablets (500-1,000 mg total) by mouth every 6 (six) hours as needed for pain.      [name] aspirin 81 MG tablet Take 81 mg by mouth daily.      [name] atorvastatin (LIPITOR) 40 MG tablet Take 1 tablet (40 mg total) by mouth daily.  90 tablet  3  . carvedilol (COREG) 25 MG tablet Take 1 tablet (25 mg total) by mouth 2 (two) times daily with a meal.  180 tablet  3  . cloNIDine (CATAPRES) 0.2 MG tablet Take 0.5 tablets (0.1 mg total) by mouth 2 (two) times  daily.  180 tablet  3  . digoxin (LANOXIN) 0.125 MG tablet Take 1 tablet (0.125 mg total) by mouth every other day.  90 tablet  3  . diltiazem (CARDIZEM CD) 120 MG 24 hr capsule Take 1 capsule (120 mg total) by mouth daily.  90 capsule  3  . docusate sodium (COLACE) 100 MG capsule Take 100 mg by mouth daily.      . feeding supplement (ENSURE COMPLETE) LIQD Take 237 mLs by  mouth 3 (three) times daily between meals.      [name] levalbuterol (XOPENEX HFA) 45 MCG/ACT inhaler Inhale 2 puffs into the lungs as needed.      . metFORMIN (GLUMETZA) 500 MG (MOD) 24 hr tablet Take 1 tablet (500 mg total) by mouth 2 (two) times daily with a meal.  60 tablet  2  . mirtazapine (REMERON) 30 MG tablet Take 30 mg by mouth at bedtime.      . nitroGLYCERIN (NITROSTAT) 0.4 MG SL tablet Place 0.4 mg under the tongue every 5 (five) minutes as needed. For chest pain      . tiotropium (SPIRIVA) 18 MCG inhalation capsule Place 18 mcg into inhaler and inhale daily.      .  furosemide (LASIX) 20 MG tablet Take 20 mg by mouth daily.         Review of Systems  Constitutional: Positive for fatigue and unexpected weight change.  HENT: Negative.   Eyes: Negative.   Respiratory: Negative.   Cardiovascular: Positive for leg swelling.  Gastrointestinal: Negative.   Musculoskeletal: Positive for gait problem.  Skin: Negative.   Neurological: Negative.   Psychiatric/Behavioral: Negative.   All other systems reviewed and are negative.    BP 129/70  Pulse 65  Ht 5\' 5"  (1.651 m)  Wt 149 lb 8 oz (67.813 kg)  BMI 24.88 kg/m2  Physical Exam  Nursing note and vitals reviewed. Constitutional: She is oriented to person, place, and time. She appears well-developed and well-nourished.  HENT:  Head: Normocephalic.  Nose: Nose normal.  Mouth/Throat: Oropharynx is clear and moist.  Eyes: Conjunctivae are normal. Pupils are equal, round, and reactive to light.  Neck: Normal range of motion. Neck supple. No JVD present.  Cardiovascular: Normal rate, regular rhythm, S1 normal, S2 normal, normal heart sounds and intact distal pulses.  Exam reveals no gallop and no friction rub.   No murmur heard. 2+ pitting edema to below her knees.  Pulmonary/Chest: Effort normal and breath sounds normal. No respiratory distress. She has no wheezes. She has no rales. She exhibits no tenderness.  Expiratory crackle   Abdominal: Soft. Bowel sounds are normal. She exhibits no distension. There is no tenderness.  Musculoskeletal: Normal range of motion. She exhibits no edema and no tenderness.  Lymphadenopathy:    She has no cervical adenopathy.  Neurological: She is alert and oriented to person, place, and time. Coordination normal.  Skin: Skin is warm and dry. No rash noted. No erythema.  Psychiatric: She has a normal mood and affect. Her behavior is normal. Judgment and thought content normal.    Assessment and Plan

## 2012-08-18 NOTE — Telephone Encounter (Signed)
lmtcb on [name] [facility] nurse) VM re: med changes made today

## 2012-08-18 NOTE — Assessment & Plan Note (Signed)
Clonidine dose decreased 0.1 mg twice a day. Holding hydralazine

## 2012-08-18 NOTE — Assessment & Plan Note (Signed)
Weight is up 10 pounds. We'll increase her Lasix to 40 mg twice a day. We'll decrease back to 40 mg daily when weight improves.

## 2012-08-18 NOTE — Assessment & Plan Note (Signed)
Recent episode of atrial flutter/A. Fib. Converted on diltiazem and low-dose digoxin. We'll continue these medications for now with her beta blocker

## 2012-08-18 NOTE — Patient Instructions (Addendum)
Start lasix 40  Mg twice a day (8 am and 2 pm) Start lasix 40 mg daily in the am for weight of less than 143 pounds Hold lasix for weight less than 138 pounds   Please call us if you have new issues that need to be addressed before your next appt.  Your physician wants you to follow-up in: 2 to 3 weeks

## 2012-08-18 NOTE — Assessment & Plan Note (Signed)
With increased diuretic, we'll need to watch renal function closely.

## 2012-08-18 NOTE — Assessment & Plan Note (Signed)
We have encouraged her to continue to work on weaning her cigarettes and smoking cessation. She will continue to work on this and does not want any assistance with chantix.  

## 2012-08-23 NOTE — Telephone Encounter (Signed)
FYI

## 2012-08-23 NOTE — Telephone Encounter (Signed)
Yes This is what [facility] nurse is doing

## 2012-08-23 NOTE — Telephone Encounter (Signed)
I think we were going to follow the protocol below: Start lasix 40 Mg twice a day (8 am and 2 pm)  take lasix 40 mg daily in the am for weight of less than 143 pounds  Hold lasix for weight less than 138 pounds

## 2012-08-23 NOTE — Telephone Encounter (Signed)
[name] with [facility] was made aware She was already aware since she spoke with someone from our office last week about changes  Says weight is down 5 pounds (140) but pt still c/o generalized malaise HR=80's, BP good  I told her I would make Dr. Mariah Milling aware

## 2012-08-24 ENCOUNTER — Encounter: Payer: Medicare Other | Admitting: Internal Medicine

## 2012-09-06 ENCOUNTER — Ambulatory Visit (INDEPENDENT_AMBULATORY_CARE_PROVIDER_SITE_OTHER): Payer: Medicare Other | Admitting: Cardiovascular Disease

## 2012-09-06 ENCOUNTER — Encounter: Payer: Self-pay | Admitting: Cardiovascular Disease

## 2012-09-06 VITALS — BP 127/76 | HR 106 | Ht 65.0 in | Wt 145.5 lb

## 2012-09-06 DIAGNOSIS — I5030 Unspecified diastolic (congestive) heart failure: Secondary | ICD-10-CM

## 2012-09-06 DIAGNOSIS — I509 Heart failure, unspecified: Secondary | ICD-10-CM

## 2012-09-06 DIAGNOSIS — Z79899 Other long term (current) drug therapy: Secondary | ICD-10-CM

## 2012-09-06 DIAGNOSIS — E785 Hyperlipidemia, unspecified: Secondary | ICD-10-CM

## 2012-09-06 DIAGNOSIS — R000 Tachycardia, unspecified: Secondary | ICD-10-CM

## 2012-09-06 DIAGNOSIS — I4891 Unspecified atrial fibrillation: Secondary | ICD-10-CM

## 2012-09-06 DIAGNOSIS — J449 Chronic obstructive pulmonary disease, unspecified: Secondary | ICD-10-CM

## 2012-09-06 DIAGNOSIS — I10 Essential (primary) hypertension: Secondary | ICD-10-CM

## 2012-09-06 MED ORDER — FUROSEMIDE 40 MG PO TABS: 40.0000 mg | ORAL_TABLET | Freq: Two times a day (BID) | ORAL | Status: DC

## 2012-09-06 MED ORDER — AMIODARONE HCL 200 MG PO TABS: 200.0000 mg | ORAL_TABLET | Freq: Two times a day (BID) | ORAL | Status: AC

## 2012-09-06 MED ORDER — CLONIDINE HCL 0.2 MG PO TABS: 0.2000 mg | ORAL_TABLET | Freq: Two times a day (BID) | ORAL | Status: DC

## 2012-09-06 NOTE — Progress Notes (Signed)
Patient ID: [name], female    DOB: [birth year], 79 y.o.   MRN: [id number]  HPI Comments: [name] is a very pleasant 79 year old woman with past medical history of coronary artery disease, stent placed to her left circumflex in 2003 which was patent on catheterization in 2008, history of peripheral vascular disease with bilateral carotid disease, 79% of the right internal carotid, 40-59% left internal carotid artery, also history of COPD, history of respiratory failure in August 2009 secondary to rapid atrial fibrillation and diastolic relaxation abnormality, history of sick sinus syndrome with ablation in May of 2009 with discontinuation of Coumadin in September of 09 secondary to bleeding.  She presents today and weight is down 4 pounds. On her last clinic visit, Lasix was increased to 40 mg twice a day. There is some confusion as she was told by the nurse from [facility] to decrease her Lasix to 20 mg daily, alternating with 40 mg daily. We do not have any record of this or any conversation with the nurse from [facility]. She continues to drink a significant amount of iced tea and Ensure daily. Continues to have lower extremity edema, better but moderate still. She presents in a wheelchair today. She reports having a chronic cough.  prior hospital admission October 10 to March 03, 2011 for pneumonia. Gentle diuresis was started she had acute renal failure. She had mildly elevated troponin which was secondary to demand ischemia.  Normal LV function on echocardiogram with mild MR and TR.  CT Scan in the hospital showed high-grade stenosis at the origin and proximal portion of the innominate artery, high-grade stenosis of the right carotid bifurcation, moderate stenosis of the proximal prevertebral left subclavian artery.   hospital admission x2 for abdominal pain, diagnosis of mesenteric disease, possible mild mesenteric ischemia. She reports that the second episode to the  hospital was from a episode of constipation with bleeding. She was diagnosed with diverticuli. Her Lasix was held after discharge from the hospital. As an outpatient, she had worsening edema and she was drinking significant fluids. Lasix was restarted.  hospital admission for dizziness. Etiology was unclear. Creatinine on arrival was 1.6. She was seen by cardiology. No intervention performed. Poor gait, weak legs.  Blood pressure was low and clonidine dose was decreased.  [facility].   She has chronic edema of the left lower extremity, always worse than the right side.   hospital admission 06/08/2012 for headache and weakness. diastolic CHF in the setting of renal failure.  Creatinine in the hospital was 1.5, peak troponin 0.6. Her Coreg was increased to 25 mg twice a day in the hospital.    history of knee replacement bilaterally over 10 years ago. Chronic pain in her knees limits her ability to ambulate  EKG shows normal sinus rhythm with, rate 106 beats per minute, nonspecific ST abnormality in leads V5, V6, 1 and aVL, intermittently paced rhythm   Outpatient Encounter Prescriptions as of 09/06/2012  Medication Sig Dispense Refill  . acetaminophen (TYLENOL EX ST ARTHRITIS PAIN) 500 MG tablet Take 1-2 tablets (500-1,000 mg total) by mouth every 6 (six) hours as needed for pain.      [name] aspirin 81 MG tablet Take 81 mg by mouth daily.      [name] atorvastatin (LIPITOR) 40 MG tablet Take 1 tablet (40 mg total) by mouth daily.  90 tablet  3  . carvedilol (COREG) 25 MG tablet Take 1 tablet (25 mg total) by mouth 2 (two)  times daily with a meal.  180 tablet  3  . digoxin (LANOXIN) 0.125 MG tablet Take 1 tablet (0.125 mg total) by mouth every other day.  90 tablet  3  . docusate sodium (COLACE) 100 MG capsule Take 100 mg by mouth daily.      . feeding supplement (ENSURE COMPLETE) LIQD Take 237 mLs by mouth 3 (three) times daily between meals.      [name] levalbuterol (XOPENEX HFA) 45 MCG/ACT  inhaler Inhale 2 puffs into the lungs as needed.      . metFORMIN (GLUMETZA) 500 MG (MOD) 24 hr tablet Take 1 tablet (500 mg total) by mouth 2 (two) times daily with a meal.  60 tablet  2  . mirtazapine (REMERON) 30 MG tablet Take 30 mg by mouth at bedtime.      . nitroGLYCERIN (NITROSTAT) 0.4 MG SL tablet Place 0.4 mg under the tongue every 5 (five) minutes as needed. For chest pain      . tiotropium (SPIRIVA) 18 MCG inhalation capsule Place 18 mcg into inhaler and inhale daily.      . cloNIDine (CATAPRES) 0.2 MG tablet Take 0.5 tablets (0.1 mg total) by mouth 2 (two) times daily.  180 tablet  3  . diltiazem (CARDIZEM CD) 120 MG 24 hr capsule Take 1 capsule (120 mg total) by mouth daily.  90 capsule  3  . furosemide (LASIX) 40 MG tablet Take 1 tablet (40 mg total) by mouth 2 (two) times daily.  60 tablet  6     Review of Systems  Constitutional: Positive for fatigue and unexpected weight change.  HENT: Negative.   Eyes: Negative.   Respiratory: Negative.   Cardiovascular: Positive for leg swelling.  Gastrointestinal: Negative.   Musculoskeletal: Positive for gait problem.  Skin: Negative.   Neurological: Negative.   Psychiatric/Behavioral: Negative.   All other systems reviewed and are negative.    BP 127/76  Pulse 106  Ht 5\' 5"  (1.651 m)  Wt 145 lb 8 oz (65.998 kg)  BMI 24.21 kg/m2  Physical Exam  Nursing note and vitals reviewed. Constitutional: She is oriented to person, place, and time. She appears well-developed and well-nourished.  HENT:  Head: Normocephalic.  Nose: Nose normal.  Mouth/Throat: Oropharynx is clear and moist.  Eyes: Conjunctivae are normal. Pupils are equal, round, and reactive to light.  Neck: Normal range of motion. Neck supple. No JVD present.  Cardiovascular: Normal rate, regular rhythm, S1 normal, S2 normal, normal heart sounds and intact distal pulses.  Exam reveals no gallop and no friction rub.   No murmur heard. 2+ pitting edema to below her  knees.  Pulmonary/Chest: Effort normal and breath sounds normal. No respiratory distress. She has no wheezes. She has no rales. She exhibits no tenderness.  Expiratory crackle  Abdominal: Soft. Bowel sounds are normal. She exhibits no distension. There is no tenderness.  Musculoskeletal: Normal range of motion. She exhibits no edema and no tenderness.  Lymphadenopathy:    She has no cervical adenopathy.  Neurological: She is alert and oriented to person, place, and time. Coordination normal.  Skin: Skin is warm and dry. No rash noted. No erythema.  Psychiatric: She has a normal mood and affect. Her behavior is normal. Judgment and thought content normal.    Assessment and Plan

## 2012-09-06 NOTE — Assessment & Plan Note (Signed)
She continues to have edema. There is concerned that diltiazem is contributing to her edema. We will hold her diltiazem, recent clonidine for blood pressure, start low-dose amiodarone 200 mg twice a day for rhythm control.

## 2012-09-06 NOTE — Patient Instructions (Addendum)
You are doing well.  Please hold diltiazem (this may make the leg swelling better) Increase clonidine to 0.2 mg twice a day  For blood pressure Start amiodarone 200 mg twice a day for heart rhythm Continue lasix 40 mg twice a day Start lasix 40 mg daily in the am for weight of less than 143 pounds (today in clinic today 145.8 pounds) Hold lasix for weight less than 138 pounds  Labs today   Please call us if you have new issues that need to be addressed before your next appt.  Your physician wants you to follow-up in: 1 month.

## 2012-09-06 NOTE — Assessment & Plan Note (Signed)
No recent lipids available. Continue on the statin

## 2012-09-06 NOTE — Assessment & Plan Note (Signed)
We will hold her diltiazem, increase her clonidine back at 0.2 mg twice a day. Diltiazem could be contributing to edema.

## 2012-09-06 NOTE — Assessment & Plan Note (Signed)
Continued edema, minimal weight drop since her last visit several weeks ago. Has 1+ pitting edema still, significant by mouth fluid intake. We have suggested she stay on Lasix 40 mg twice a day holding her Lasix with weight parameters as detailed in the instructions.

## 2012-09-06 NOTE — Assessment & Plan Note (Signed)
She continues to smoke. We have counseled her about this. Chronic cough likely from chronic bronchitis

## 2012-09-07 LAB — BASIC METABOLIC PANEL
BUN: 21 mg/dL (ref 8–27)
Calcium: 8.7 mg/dL (ref 8.6–10.2)
Creatinine, Ser: 1.43 mg/dL — ABNORMAL HIGH (ref 0.57–1.00)
GFR calc non Af Amer: 33 mL/min/{1.73_m2} — ABNORMAL LOW (ref >59)
Glucose: 76 mg/dL (ref 65–99)

## 2012-09-12 ENCOUNTER — Telehealth: Payer: Self-pay

## 2012-09-12 NOTE — Telephone Encounter (Signed)
Just FYI.

## 2012-09-12 NOTE — Telephone Encounter (Signed)
[name] wants you to call her when you get a chance

## 2012-09-12 NOTE — Telephone Encounter (Signed)
[name] just wanted to inform us that she went to visit [name] today and was made aware of changes we made at last OV She made sure [name]'s pill box was filled per Dr. Windell Hummingbird orders She understands we checked a BMP and we will call her after Dr. Mariah Milling reviews if any changes need to be made to her medications [facility] services will run out shortly, going to try to see if [facility] has a program that can help [name] with medication administration, unsure if [name] qualifies for Hospice; [name] voiced interest in going to assisted living but family may not allow this since there are financial issues [name] to get SW involved and will try to set up plan before [facility] has to pull out

## 2012-09-13 ENCOUNTER — Telehealth: Payer: Self-pay

## 2012-09-13 NOTE — Telephone Encounter (Signed)
Patient still having dizziness, she feels dizzy every time she stands up. Would like to know if should change the dose of medications. Please contact the patient ASAP!

## 2012-09-13 NOTE — Telephone Encounter (Signed)
Per [name] with Hospice BPs have been "good" on medications Says most of the time, when she comes to visit pt, pt has yet to take her am meds on her own and needs prompting from the nurse Says BP prior to meds=183/70 but after meds BP=136/80 Unsure if dizziness is ortho stasis Mentions pt was taking "double meds" in the past, was taking meds from pill box as well as meds from bottles Really feels pt needs assistance all the time since [facility] resources are drawing to a close  I am unsure as to what is causing dizziness but will inform Dr. Mariah Milling of above

## 2012-09-13 NOTE — Telephone Encounter (Signed)
Just made several  Medication changes several days ago Held diltiazem Increased clonidine Over the next week, orthostatics could be done, supine, sitting, immediately after standing If there is a drop in the blood pressure, changes to medications could be made  Dizziness seems to be a chronic issue based on previous hospital admissions Uncertain if his medications Orthostatic numbers would help

## 2012-09-13 NOTE — Telephone Encounter (Signed)
Pt wants [place] to know she gets dizzy with standing Says she felt this way slightly when she was here last OV She is concerned it may be r/t one of the medications She is unsure of her BP readings, as HH nurse, [name] checks this when she comes to see pt I will call [name] to get BP readings.

## 2012-09-14 NOTE — Telephone Encounter (Signed)
i called amy with [facility] and asked if she could check orthostatics on pt She will do this on next visit 09/19/12 Wanted to let us know she assisted pt on scale 2 days ago and when pt stood she denied c/o dizziness Will await orthostatic readings

## 2012-09-20 ENCOUNTER — Ambulatory Visit: Payer: Medicare Other | Admitting: Cardiovascular Disease

## 2012-09-21 ENCOUNTER — Ambulatory Visit: Payer: Medicare Other | Admitting: Family Medicine

## 2012-09-21 ENCOUNTER — Telehealth: Payer: Self-pay | Admitting: *Deleted

## 2012-09-21 NOTE — Telephone Encounter (Signed)
Is this ok?

## 2012-09-21 NOTE — Telephone Encounter (Signed)
Ok for home health visit

## 2012-09-21 NOTE — Telephone Encounter (Signed)
[phone] needs verbal ok for home health visit. Needs help with finance.

## 2012-09-22 NOTE — Telephone Encounter (Signed)
LMOM ok for [facility] visit

## 2012-09-25 ENCOUNTER — Ambulatory Visit (INDEPENDENT_AMBULATORY_CARE_PROVIDER_SITE_OTHER): Payer: Medicare Other | Admitting: Family Medicine

## 2012-09-25 ENCOUNTER — Encounter: Payer: Self-pay | Admitting: Family Medicine

## 2012-09-25 VITALS — BP 160/60 | HR 79 | Temp 97.5°F | Wt 145.0 lb

## 2012-09-25 DIAGNOSIS — J449 Chronic obstructive pulmonary disease, unspecified: Secondary | ICD-10-CM

## 2012-09-25 DIAGNOSIS — F17200 Nicotine dependence, unspecified, uncomplicated: Secondary | ICD-10-CM

## 2012-09-25 DIAGNOSIS — I5032 Chronic diastolic (congestive) heart failure: Secondary | ICD-10-CM

## 2012-09-25 DIAGNOSIS — R5383 Other fatigue: Secondary | ICD-10-CM

## 2012-09-25 DIAGNOSIS — I959 Hypotension, unspecified: Secondary | ICD-10-CM

## 2012-09-25 DIAGNOSIS — N289 Disorder of kidney and ureter, unspecified: Secondary | ICD-10-CM

## 2012-09-25 DIAGNOSIS — I10 Essential (primary) hypertension: Secondary | ICD-10-CM

## 2012-09-25 DIAGNOSIS — R634 Abnormal weight loss: Secondary | ICD-10-CM

## 2012-09-25 LAB — COMPREHENSIVE METABOLIC PANEL
ALT: 13 U/L (ref 0–35)
AST: 17 U/L (ref 0–37)
CO2: 31 mEq/L (ref 19–32)
Calcium: 8.5 mg/dL (ref 8.4–10.5)
Chloride: 101 mEq/L (ref 96–112)
GFR: 40.41 mL/min — ABNORMAL LOW (ref >60.00)
Sodium: 139 mEq/L (ref 135–145)
Total Bilirubin: 0.6 mg/dL (ref 0.3–1.2)
Total Protein: 6.8 g/dL (ref 6.0–8.3)

## 2012-09-25 LAB — CBC WITH DIFFERENTIAL/PLATELET
Basophils Absolute: 0 10*3/uL (ref 0.0–0.1)
Eosinophils Absolute: 0.1 10*3/uL (ref 0.0–0.7)
Hemoglobin: 11.6 g/dL — ABNORMAL LOW (ref 12.0–15.0)
Lymphocytes Relative: 13.5 % (ref 12.0–46.0)
MCHC: 33.2 g/dL (ref 30.0–36.0)
MCV: 92.3 fl (ref 78.0–100.0)
Monocytes Absolute: 0.7 10*3/uL (ref 0.1–1.0)
Neutro Abs: 7 10*3/uL (ref 1.4–7.7)
RDW: 16 % — ABNORMAL HIGH (ref 11.5–14.6)

## 2012-09-25 MED ORDER — MIRTAZAPINE 30 MG PO TABS: 30.0000 mg | ORAL_TABLET | Freq: Every day | ORAL | Status: DC

## 2012-09-25 NOTE — Patient Instructions (Addendum)
Good to see you. Let's add some ensure to your diet- add it once a day, can increase up to three times a day.  We will call you with your lab results.  Please restart your mirtazapine (remeron) at bedtime.  Please call your surgeon and ask them what type of hip and knee replacements you had (exact name)- call me back with that information.

## 2012-09-25 NOTE — Progress Notes (Signed)
HPI [name] is a very pleasant 79 yo female here with complicate medical history, including CHF, PVD, COPD, afib, here to discuss persistent weight loss, fatigue and decreased appetite.  [name], her home health nurse, made this appointment as these issues have been concerning her.  This has been unfortunately on ongoing issue.  Initially, family felt she may have been depressed. Started Remeron 15 mg nightly and increased to 30 mg nightly last year to see if we could stimulate appetite and help with depressive symptoms. She stopped taking it when she ran out.  Saw Dr. Mariah Milling last month-  Note reviewed.  Diuretic doses have been adjusted for worsening edema so some weight loss (Fluid loss) would be expected.  Remains on digoxin- Lab Results  Component Value Date   DIGOXIN 1.8 04/03/2012     Her appetite is poor. She has reduced energy. She denies syncope. She notes anorexia. She denies any change in her bowel or bladder habit. No nausea or vomiting.  Wt Readings from Last 3 Encounters:  09/25/12 145 lb (65.772 kg)  09/06/12 145 lb 8 oz (65.998 kg)  08/18/12 149 lb 8 oz (67.813 kg)     Lab Results  Component Value Date   WBC 7.1 04/04/2012   HGB 11.5* 04/04/2012   HCT 34.0* 04/04/2012   MCV 93.2 04/04/2012   PLT 285 04/04/2012   Lab Results  Component Value Date   TSH 3.590 02/04/2012   Lab Results  Component Value Date   CREATININE 1.43* 09/06/2012    Allergies  Allergen Reactions  . Naproxen     REACTION: gastritis     Current Outpatient Prescriptions  Medication Sig Dispense Refill  . acetaminophen (TYLENOL EX ST ARTHRITIS PAIN) 500 MG tablet Take 1-2 tablets (500-1,000 mg total) by mouth every 6 (six) hours as needed for pain.      [name] amiodarone (PACERONE) 200 MG tablet Take 1 tablet (200 mg total) by mouth 2 (two) times daily.  60 tablet  3  . aspirin 81 MG tablet Take 81 mg by mouth daily.      [name] atorvastatin (LIPITOR) 40 MG tablet Take 1 tablet (40 mg total)  by mouth daily.  90 tablet  3  . carvedilol (COREG) 25 MG tablet Take 1 tablet (25 mg total) by mouth 2 (two) times daily with a meal.  180 tablet  3  . cloNIDine (CATAPRES) 0.2 MG tablet Take 1 tablet (0.2 mg total) by mouth 2 (two) times daily.  180 tablet  3  . digoxin (LANOXIN) 0.125 MG tablet Take 1 tablet (0.125 mg total) by mouth every other day.  90 tablet  3  . docusate sodium (COLACE) 100 MG capsule Take 100 mg by mouth daily.      . feeding supplement (ENSURE COMPLETE) LIQD Take 237 mLs by mouth 3 (three) times daily between meals.      . furosemide (LASIX) 40 MG tablet Take 1 tablet (40 mg total) by mouth 2 (two) times daily.  60 tablet  6  . levalbuterol (XOPENEX HFA) 45 MCG/ACT inhaler Inhale 2 puffs into the lungs as needed.      . metFORMIN (GLUMETZA) 500 MG (MOD) 24 hr tablet Take 1 tablet (500 mg total) by mouth 2 (two) times daily with a meal.  60 tablet  2  . mirtazapine (REMERON) 30 MG tablet Take 30 mg by mouth at bedtime.      . nitroGLYCERIN (NITROSTAT) 0.4 MG SL tablet Place 0.4 mg under the  tongue every 5 (five) minutes as needed. For chest pain      . tiotropium (SPIRIVA) 18 MCG inhalation capsule Place 18 mcg into inhaler and inhale daily.       No current facility-administered medications for this visit.     Past Medical History  Diagnosis Date  . Coronary artery disease     a. Acute MI 2003, stent to LCx. b. NSTEMI 2008 (cath w/o PCI).  c. +trop 02/2011 felt 2/2 demand ischemia.  [name] History of atrial flutter 2009    a. s/p ablation of typical flutter 09/2007.  [name] Paroxysmal atrial fibrillation     a. Dx 2009. b. Discontinuation of Coumadin 01/2008 2/2 hemarthrosis/chronic debilitation.  [name] COPD (chronic obstructive pulmonary disease)   . Hyperlipidemia   . Diabetes mellitus   . Diastolic congestive heart failure   . Arthritis   . Chronic kidney disease   . Carotid artery disease     a. Bilateral  . SSS (sick sinus syndrome)     a. Symptomatic  bradycardia/syncope and post-termination pauses - St. Jude dual chamber PPM 09/2007.   [name] Pneumonia   . Anxiety     adjustment disorder  . Pacemaker   . Mesenteric ischemia     a. Possible dx, 2013.  . Diverticula of colon     a. Colonoscopy 01/2012.  [name] PVD (peripheral vascular disease)     Carotid dz as noted, repoted aortoiliac occlusive dz, also reported high-grade stenosis at the origin & prox innominate artery, high-grade stenosis of the right carotid bifurcation, moderate stenosis of the proximal prevertebral left subclavian artery by CT 02/2011  . Pituitary tumor     s/p gamma knife    ROS:   All systems reviewed and negative except as noted in the HPI.   Past Surgical History  Procedure Laterality Date  . Cardiac catheterization      2003 cardiac stent  . Insert / replace / remove pacemaker  2009    St. Jude  . Cholecystectomy    . Total knee arthroplasty      bilateral  . Bilateral hip arthroscopy    . 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 Myocardial perfusion study      EF 59% 4/04  . Adenosine myoview study      abn EF 53% 12/04/04  . Carotid doppler  2/99  . Echo with lvh  1/98    old records  . Stress cardiolite  3/04    EF 53%  . Gamma knife radiosurg  08/25/06    [facility]  . Pvd      with stent in the right iliac   . Colonoscopy  01/27/2012    Procedure: COLONOSCOPY;  Surgeon: Louis Meckel, MD;  Location: [facility] ENDOSCOPY;  Service: Endoscopy;  Laterality: N/A;     Family History  Problem Relation Age of Onset  . Cancer Neg Hx   . Depression      ?  . Diabetes      DM and HBP- family hx     History   Social History  . Marital Status: Widowed    Spouse Name: N/A    Number of Children: N/A  . Years of Education: N/A   Occupational History  . Not on file.   Social History Main Topics  . Smoking status: Current Every Day Smoker -- 0.25 packs/day for 40 years    Types: Cigarettes    Last  Attempt to  Quit: 04/02/2012  . Smokeless tobacco: Never Used  . Alcohol Use: No  . Drug Use: No  . Sexually Active:    Other Topics Concern  . Not on file   Social History Narrative   Widowed, husband died at age 69-DM, HTN. 3 children.    Nursing assistant in past, active in church.    Son lives with her and does most of the housework and cooking. 1/10   Pt signed party release form and gives Zane Herald, granddaughter [phone]), access to medical records. Can leave msg on machine [phone]) or cell.       Physical Exam: BP 160/60  Pulse 79  Temp(Src) 97.5 F (36.4 C)  Wt 145 lb (65.772 kg)  BMI 24.13 kg/m2  SpO2 98%  Chronically ill appearing elderly woman, NAD HEENT: Unremarkable Neck:  No JVD, no thyromegally Lungs:  Clear with no wheezes, rales, or rhonchi. HEART:  Regular rate rhythm, no murmurs, no rubs, no clicks Abd:  soft, positive bowel sounds, no organomegally, no rebound, no guarding Ext:  2 plus pulses, no edema, no cyanosis, no clubbing Skin:  No rashes no nodules Neuro:  CN II through XII intact, motor grossly intact   Assessment and Plan:  1. Weight loss I suspect part of this is due to fluid loss from increased diuresis. Will check labs to rule out other possible factors.  Did advise that she restart Ensure.  - Comprehensive metabolic panel - Digoxin level - CBC with Differential - TSH - T4, free  2.  Fatigue Deteriorated.  I suspect this multifactorial.  I did advise again that she quit smoking as this can worsen her DOE and fatigue.

## 2012-09-28 ENCOUNTER — Encounter: Payer: Medicare Other | Admitting: Internal Medicine

## 2012-10-05 ENCOUNTER — Telehealth: Payer: Self-pay

## 2012-10-05 NOTE — Telephone Encounter (Signed)
Call pt re: lab results

## 2012-10-06 NOTE — Telephone Encounter (Signed)
lmtcb

## 2012-10-11 ENCOUNTER — Encounter: Payer: Self-pay | Admitting: Cardiovascular Disease

## 2012-10-11 ENCOUNTER — Ambulatory Visit (INDEPENDENT_AMBULATORY_CARE_PROVIDER_SITE_OTHER): Payer: Medicare Other | Admitting: Cardiovascular Disease

## 2012-10-11 VITALS — BP 199/69 | HR 77 | Ht 65.0 in | Wt 133.8 lb

## 2012-10-11 DIAGNOSIS — I10 Essential (primary) hypertension: Secondary | ICD-10-CM

## 2012-10-11 DIAGNOSIS — I5032 Chronic diastolic (congestive) heart failure: Secondary | ICD-10-CM

## 2012-10-11 DIAGNOSIS — I708 Atherosclerosis of other arteries: Secondary | ICD-10-CM

## 2012-10-11 DIAGNOSIS — I959 Hypotension, unspecified: Secondary | ICD-10-CM

## 2012-10-11 DIAGNOSIS — I4891 Unspecified atrial fibrillation: Secondary | ICD-10-CM

## 2012-10-11 DIAGNOSIS — F17200 Nicotine dependence, unspecified, uncomplicated: Secondary | ICD-10-CM

## 2012-10-11 DIAGNOSIS — I771 Stricture of artery: Secondary | ICD-10-CM

## 2012-10-11 DIAGNOSIS — I779 Disorder of arteries and arterioles, unspecified: Secondary | ICD-10-CM

## 2012-10-11 MED ORDER — CLONIDINE HCL 0.2 MG PO TABS: 0.2000 mg | ORAL_TABLET | Freq: Two times a day (BID) | ORAL | Status: DC

## 2012-10-11 MED ORDER — HYDRALAZINE HCL 50 MG PO TABS: 50.0000 mg | ORAL_TABLET | Freq: Three times a day (TID) | ORAL | Status: DC

## 2012-10-11 NOTE — Assessment & Plan Note (Addendum)
Currently maintaining normal sinus rhythm. On beta blocker and amiodarone. In followup, we'll  decrease the amiodarone to 200 mg daily

## 2012-10-11 NOTE — Telephone Encounter (Signed)
Pt has appt with us today

## 2012-10-11 NOTE — Patient Instructions (Addendum)
Please increase the clonidine to a full pill twice a day, 0.2  Please start hydralazine 50 mg three times a day (AM, PM and before bed)  Please call us if you have new issues that need to be addressed before your next appt.  Your physician wants you to follow-up in: 1 month.

## 2012-10-11 NOTE — Assessment & Plan Note (Signed)
Recommended smoking cessation. Continue cholesterol medication and aspirin

## 2012-10-11 NOTE — Assessment & Plan Note (Signed)
We have encouraged her to continue to work on weaning her cigarettes and smoking cessation. She will continue to work on this and does not want any assistance with chantix.  

## 2012-10-11 NOTE — Assessment & Plan Note (Signed)
Blood pressure is very elevated today. We will start heart is clonidine 0.2 mg twice a day. Also add hydralazine 50 mg 3 times a day. Blood pressure was confirmed on recheck by myself, 200  systolic on the left. I suspect she has subclavian arterial disease on the right as blood pressure is low.

## 2012-10-11 NOTE — Assessment & Plan Note (Signed)
Improved edema somewhat though still 1+, stable. We'll not use  calcium channel blockers which makes her edema worse.

## 2012-10-11 NOTE — Assessment & Plan Note (Signed)
Low blood pressure on the right. Currently with no symptoms.

## 2012-10-11 NOTE — Progress Notes (Signed)
Patient ID: [name], female    DOB: 1924/04/07, 79 y.o.   MRN: [id number]  HPI Comments: And [name] is a very pleasant 79 year old woman with past medical history of coronary artery disease, stent placed to her left circumflex in 2003 which was patent on catheterization in 2008, history of peripheral vascular disease with bilateral carotid disease, 79% of the right internal carotid, 40-59% left internal carotid artery, also history of COPD, history of respiratory failure in August 2009 secondary to rapid atrial fibrillation and diastolic relaxation abnormality, history of sick sinus syndrome with ablation in May of 2009 with discontinuation of Coumadin in September of 09 secondary to bleeding.  Legs have been getting progressively weak and she requires significant assistance with walking. Diltiazem was held on previous office visit for lower external edema. She reports edema has significantly improved though still has some. She does not check her blood pressure at home on a regular basis. Blood pressure is very elevated today. She is taking Lasix 20 mg twice a day alternating with 20 mg daily. Weight has been relatively stable by her report.  She continues to drink a significant amount of iced tea and Ensure daily.   prior hospital admission October 10 to March 03, 2011 for pneumonia. Gentle diuresis was started she had acute renal failure. She had mildly elevated troponin which was secondary to demand ischemia.  Normal LV function on echocardiogram with mild MR and TR.  CT Scan in the hospital showed high-grade stenosis at the origin and proximal portion of the innominate artery, high-grade stenosis of the right carotid bifurcation, moderate stenosis of the proximal prevertebral left subclavian artery.   hospital admission x2 for abdominal pain, diagnosis of mesenteric disease, possible mild mesenteric ischemia. She reports that the second episode to the hospital was from  a episode of constipation with bleeding. She was diagnosed with diverticuli. Her Lasix was held after discharge from the hospital. As an outpatient, she had worsening edema and she was drinking significant fluids. Lasix was restarted.  hospital admission for dizziness. Etiology was unclear. Creatinine on arrival was 1.6. She was seen by cardiology. No intervention performed. Poor gait, weak legs.  Blood pressure was low and clonidine dose was decreased.  [facility].    hospital admission 06/08/2012 for headache and weakness. diastolic CHF in the setting of renal failure.  Creatinine in the hospital was 1.5, peak troponin 0.6.   history of knee replacement bilaterally over 10 years ago. Chronic pain in her knees limits her ability to ambulate  EKG shows normal sinus rhythm with, rate 77 beats per minute, nonspecific ST abnormality   Outpatient Encounter Prescriptions as of 10/11/2012  Medication Sig Dispense Refill  . acetaminophen (TYLENOL EX ST ARTHRITIS PAIN) 500 MG tablet Take 1-2 tablets (500-1,000 mg total) by mouth every 6 (six) hours as needed for pain.      [name] albuterol (PROVENTIL HFA;VENTOLIN HFA) 108 (90 BASE) MCG/ACT inhaler Inhale 2 puffs into the lungs every 6 (six) hours as needed for wheezing.      [name] amiodarone (PACERONE) 200 MG tablet Take 1 tablet (200 mg total) by mouth 2 (two) times daily.  60 tablet  3  . aspirin 81 MG tablet Take 81 mg by mouth daily.      [name] atorvastatin (LIPITOR) 40 MG tablet Take 1 tablet (40 mg total) by mouth daily.  90 tablet  3  . carvedilol (COREG) 25 MG tablet Take 1 tablet (25 mg total) by mouth  2 (two) times daily with a meal.  180 tablet  3  . digoxin (LANOXIN) 0.125 MG tablet Take 1 tablet (0.125 mg total) by mouth every other day.  90 tablet  3  . docusate sodium (COLACE) 100 MG capsule Take 100 mg by mouth daily.      . feeding supplement (ENSURE COMPLETE) LIQD Take 237 mLs by mouth 3 (three) times daily between meals.      .  furosemide (LASIX) 40 MG tablet Take 1 tablet (40 mg total) by mouth 2 (two) times daily.  60 tablet  6  . levalbuterol (XOPENEX HFA) 45 MCG/ACT inhaler Inhale 2 puffs into the lungs as needed.      . metFORMIN (GLUMETZA) 500 MG (MOD) 24 hr tablet Take 1 tablet (500 mg total) by mouth 2 (two) times daily with a meal.  60 tablet  2  . mirtazapine (REMERON) 30 MG tablet Take 1 tablet (30 mg total) by mouth at bedtime.  30 tablet  6  . nitroGLYCERIN (NITROSTAT) 0.4 MG SL tablet Place 0.4 mg under the tongue every 5 (five) minutes as needed. For chest pain      . tiotropium (SPIRIVA) 18 MCG inhalation capsule Place 18 mcg into inhaler and inhale daily.      .  cloNIDine (CATAPRES) 0.2 MG tablet Take 0.1 mg by mouth 2 (two) times daily.         Review of Systems  Constitutional: Positive for fatigue.  HENT: Negative.   Eyes: Negative.   Respiratory: Negative.   Cardiovascular: Positive for leg swelling.  Gastrointestinal: Negative.   Musculoskeletal: Positive for gait problem.  Skin: Negative.   Neurological: Negative.   Psychiatric/Behavioral: Negative.   All other systems reviewed and are negative.    BP 199/69  Pulse 77  Ht 5\' 5"  (1.651 m)  Wt 133 lb 12 oz (60.669 kg)  BMI 22.26 kg/m2  Physical Exam  Nursing note and vitals reviewed. Constitutional: She is oriented to person, place, and time. She appears well-developed and well-nourished.  HENT:  Head: Normocephalic.  Nose: Nose normal.  Mouth/Throat: Oropharynx is clear and moist.  Eyes: Conjunctivae are normal. Pupils are equal, round, and reactive to light.  Neck: Normal range of motion. Neck supple. No JVD present.  Cardiovascular: Normal rate, regular rhythm, S1 normal, S2 normal, normal heart sounds and intact distal pulses.  Exam reveals no gallop and no friction rub.   No murmur heard. 2+ pitting edema to below her knees.  Pulmonary/Chest: Effort normal and breath sounds normal. No respiratory distress. She has no  wheezes. She has no rales. She exhibits no tenderness.  Expiratory crackle  Abdominal: Soft. Bowel sounds are normal. She exhibits no distension. There is no tenderness.  Musculoskeletal: Normal range of motion. She exhibits no edema and no tenderness.  Lymphadenopathy:    She has no cervical adenopathy.  Neurological: She is alert and oriented to person, place, and time. Coordination normal.  Skin: Skin is warm and dry. No rash noted. No erythema.  Psychiatric: She has a normal mood and affect. Her behavior is normal. Judgment and thought content normal.    Assessment and Plan

## 2012-10-20 ENCOUNTER — Encounter: Payer: Medicare Other | Admitting: Internal Medicine

## 2012-10-31 ENCOUNTER — Telehealth: Payer: Self-pay

## 2012-10-31 MED ORDER — HYDRALAZINE HCL 50 MG PO TABS: 50.0000 mg | ORAL_TABLET | Freq: Three times a day (TID) | ORAL | Status: DC

## 2012-10-31 NOTE — Telephone Encounter (Signed)
wantes to "recertify pt for another 60 days", also needs rx for hydralazine. Please call

## 2012-10-31 NOTE — Telephone Encounter (Signed)
[name] informed ok to recertify for 60 days rx refill sent to pharm

## 2012-11-06 ENCOUNTER — Other Ambulatory Visit (INDEPENDENT_AMBULATORY_CARE_PROVIDER_SITE_OTHER): Payer: Medicare Other

## 2012-11-06 DIAGNOSIS — R634 Abnormal weight loss: Secondary | ICD-10-CM

## 2012-11-08 ENCOUNTER — Encounter: Payer: Self-pay | Admitting: *Deleted

## 2012-11-16 ENCOUNTER — Encounter: Payer: Self-pay | Admitting: Cardiovascular Disease

## 2012-11-16 ENCOUNTER — Ambulatory Visit (INDEPENDENT_AMBULATORY_CARE_PROVIDER_SITE_OTHER): Payer: Medicare Other | Admitting: Cardiovascular Disease

## 2012-11-16 VITALS — BP 142/56 | HR 70 | Ht 65.0 in | Wt 134.2 lb

## 2012-11-16 DIAGNOSIS — Z950 Presence of cardiac pacemaker: Secondary | ICD-10-CM

## 2012-11-16 DIAGNOSIS — I4891 Unspecified atrial fibrillation: Secondary | ICD-10-CM

## 2012-11-16 DIAGNOSIS — R5383 Other fatigue: Secondary | ICD-10-CM

## 2012-11-16 DIAGNOSIS — I959 Hypotension, unspecified: Secondary | ICD-10-CM

## 2012-11-16 DIAGNOSIS — I5032 Chronic diastolic (congestive) heart failure: Secondary | ICD-10-CM

## 2012-11-16 DIAGNOSIS — I2510 Atherosclerotic heart disease of native coronary artery without angina pectoris: Secondary | ICD-10-CM

## 2012-11-16 DIAGNOSIS — R5381 Other malaise: Secondary | ICD-10-CM

## 2012-11-16 DIAGNOSIS — I10 Essential (primary) hypertension: Secondary | ICD-10-CM

## 2012-11-16 DIAGNOSIS — I495 Sick sinus syndrome: Secondary | ICD-10-CM

## 2012-11-16 NOTE — Assessment & Plan Note (Signed)
She reports having difficulty sleeping. We have suggested she try Benadryl at nighttime

## 2012-11-16 NOTE — Progress Notes (Signed)
Patient ID: [name], female    DOB: Feb 07, 1924, 79 y.o.   MRN: [id number]  HPI Comments: [name] is a very pleasant 79 year old woman with past medical history of coronary artery disease, stent placed to her left circumflex in 2003 which was patent on catheterization in 2008, history of peripheral vascular disease with bilateral carotid disease, 79% of the right internal carotid, 40-59% left internal carotid artery, also history of COPD, history of respiratory failure in August 2009 secondary to rapid atrial fibrillation and diastolic relaxation abnormality, history of sick sinus syndrome with ablation in May of 2009 with discontinuation of Coumadin in September of 09 secondary to bleeding.  Diltiazem was held on previous office visit for lower external edema. edema improved to some degree On her last visit, systolic pressures were in the 200s. Medication changes were made. Nurses have been checking her blood pressure at home and reports significant improvement. Diuretic was also increased with improvement of her edema. No dramatic weight change in followup today.  Recent basic metabolic panel 10 days ago showed climb in her creatinine to 1.8 up from 1.6, previously 1.4. She is very tired today, is not sleeping well.  prior hospital admission October 10 to March 03, 2011 for pneumonia. Gentle diuresis was started she had acute renal failure. She had mildly elevated troponin which was secondary to demand ischemia.  Normal LV function on echocardiogram with mild MR and TR.  CT Scan in the hospital showed high-grade stenosis at the origin and proximal portion of the innominate artery, high-grade stenosis of the right carotid bifurcation, moderate stenosis of the proximal prevertebral left subclavian artery.   hospital admission x2 for abdominal pain, diagnosis of mesenteric disease, possible mild mesenteric ischemia. She reports that the second episode to the hospital was from  a episode of constipation with bleeding. She was diagnosed with diverticuli. Her Lasix was held after discharge from the hospital. As an outpatient, she had worsening edema and she was drinking significant fluids. Lasix was restarted.  hospital admission for dizziness. Etiology was unclear. Creatinine on arrival was 1.6. She was seen by cardiology. No intervention performed. Poor gait, weak legs.  Blood pressure was low and clonidine dose was decreased.  [facility].    hospital admission 06/08/2012 for headache and weakness. diastolic CHF in the setting of renal failure.  Creatinine in the hospital was 1.5, peak troponin 0.6.   history of knee replacement bilaterally over 10 years ago. Chronic pain in her knees limits her ability to ambulate  EKG shows normal sinus rhythm with, rate 70 beats per minute, nonspecific ST abnormality, PVC noted   Outpatient Encounter Prescriptions as of 11/16/2012  Medication Sig Dispense Refill  . acetaminophen (TYLENOL EX ST ARTHRITIS PAIN) 500 MG tablet Take 1-2 tablets (500-1,000 mg total) by mouth every 6 (six) hours as needed for pain.      [name] albuterol (PROVENTIL HFA;VENTOLIN HFA) 108 (90 BASE) MCG/ACT inhaler Inhale 2 puffs into the lungs every 6 (six) hours as needed for wheezing.      [name] amiodarone (PACERONE) 200 MG tablet Take 1 tablet (200 mg total) by mouth 2 (two) times daily.  60 tablet  3  . aspirin 81 MG tablet Take 81 mg by mouth daily.      [name] atorvastatin (LIPITOR) 40 MG tablet Take 1 tablet (40 mg total) by mouth daily.  90 tablet  3  . carvedilol (COREG) 25 MG tablet Take 1 tablet (25 mg total) by mouth 2 (  two) times daily with a meal.  180 tablet  3  . cloNIDine (CATAPRES) 0.2 MG tablet Take 1 tablet (0.2 mg total) by mouth 2 (two) times daily.  60 tablet  6  . digoxin (LANOXIN) 0.125 MG tablet Take 1 tablet (0.125 mg total) by mouth every other day.  90 tablet  3  . docusate sodium (COLACE) 100 MG capsule Take 100 mg by mouth daily.       . feeding supplement (ENSURE COMPLETE) LIQD Take 237 mLs by mouth 3 (three) times daily between meals.      . furosemide (LASIX) 40 MG tablet Take one tablet daily alternate with one tablet twice a day.      . hydrALAZINE (APRESOLINE) 50 MG tablet Take 1 tablet (50 mg total) by mouth 3 (three) times daily.  90 tablet  3  . levalbuterol (XOPENEX HFA) 45 MCG/ACT inhaler Inhale 2 puffs into the lungs as needed.      . metFORMIN (GLUMETZA) 500 MG (MOD) 24 hr tablet Take 1 tablet (500 mg total) by mouth 2 (two) times daily with a meal.  60 tablet  2  . nitroGLYCERIN (NITROSTAT) 0.4 MG SL tablet Place 0.4 mg under the tongue every 5 (five) minutes as needed. For chest pain      . tiotropium (SPIRIVA) 18 MCG inhalation capsule Place 18 mcg into inhaler and inhale daily.         Review of Systems  Constitutional: Positive for fatigue.  HENT: Negative.   Eyes: Negative.   Respiratory: Negative.   Gastrointestinal: Negative.   Musculoskeletal: Positive for gait problem.  Skin: Negative.   Neurological: Negative.   Psychiatric/Behavioral: Negative.   All other systems reviewed and are negative.    BP 142/56  Pulse 70  Ht 5\' 5"  (1.651 m)  Wt 134 lb 4 oz (60.895 kg)  BMI 22.34 kg/m2  Physical Exam  Nursing note and vitals reviewed. Constitutional: She is oriented to person, place, and time. She appears well-developed and well-nourished.  HENT:  Head: Normocephalic.  Nose: Nose normal.  Mouth/Throat: Oropharynx is clear and moist.  Eyes: Conjunctivae are normal. Pupils are equal, round, and reactive to light.  Neck: Normal range of motion. Neck supple. No JVD present.  Cardiovascular: Normal rate, regular rhythm, S1 normal, S2 normal, normal heart sounds and intact distal pulses.  Exam reveals no gallop and no friction rub.   No murmur heard. Trace edema to below her knees.  Pulmonary/Chest: Effort normal and breath sounds normal. No respiratory distress. She has no wheezes. She has  no rales. She exhibits no tenderness.  Expiratory crackle  Abdominal: Soft. Bowel sounds are normal. She exhibits no distension. There is no tenderness.  Musculoskeletal: Normal range of motion. She exhibits no edema and no tenderness.  Lymphadenopathy:    She has no cervical adenopathy.  Neurological: She is alert and oriented to person, place, and time. Coordination normal.  Skin: Skin is warm and dry. No rash noted. No erythema.  Psychiatric: She has a normal mood and affect. Her behavior is normal. Judgment and thought content normal.    Assessment and Plan

## 2012-11-16 NOTE — Assessment & Plan Note (Signed)
Blood pressure significantly improved compared to prior clinic visit. No further medication changes

## 2012-11-16 NOTE — Assessment & Plan Note (Signed)
Currently with no symptoms of angina. No further workup at this time. Continue current medication regimen. 

## 2012-11-16 NOTE — Assessment & Plan Note (Signed)
Creatinine started to climb. We have suggested she stay on her current Lasix regimen but increase her fluid intake. If creatinine climbs further, would change diuretic regimen to daily, no twice a day dosing.

## 2012-11-16 NOTE — Patient Instructions (Addendum)
You are doing well. No medication changes were made.  Please drink a little bit more fluids. Kidney is getting a little dry For difficulty sleeping, try Tylenol PM (or regular tylenol with benadryl) at night  Please call us if you have new issues that need to be addressed before your next appt.  Your physician wants you to follow-up in: 6 months.  You will receive a reminder letter in the mail two months in advance. If you don't receive a letter, please call our office to schedule the follow-up appointment.

## 2012-11-16 NOTE — Assessment & Plan Note (Signed)
She is maintaining normal sinus rhythm. 

## 2012-11-16 NOTE — Assessment & Plan Note (Signed)
She has followup with Dr. Ladona Ridgel next week

## 2012-11-23 ENCOUNTER — Ambulatory Visit (INDEPENDENT_AMBULATORY_CARE_PROVIDER_SITE_OTHER): Payer: Medicare Other | Admitting: Internal Medicine

## 2012-11-23 ENCOUNTER — Encounter: Payer: Self-pay | Admitting: Internal Medicine

## 2012-11-23 VITALS — BP 158/60 | Ht 65.0 in | Wt 134.8 lb

## 2012-11-23 DIAGNOSIS — I4891 Unspecified atrial fibrillation: Secondary | ICD-10-CM

## 2012-11-23 DIAGNOSIS — Z950 Presence of cardiac pacemaker: Secondary | ICD-10-CM

## 2012-11-23 DIAGNOSIS — I5032 Chronic diastolic (congestive) heart failure: Secondary | ICD-10-CM

## 2012-11-23 LAB — PACEMAKER DEVICE OBSERVATION
AL IMPEDENCE PM: 310 Ohm
AL THRESHOLD: 0.5 V
BAMS-0003: 70 {beats}/min
DEVICE MODEL PM: 1210980
RV LEAD AMPLITUDE: 12 mv
VENTRICULAR PACING PM: 8

## 2012-11-23 MED ORDER — APIXABAN 2.5 MG PO TABS: 2.5000 mg | ORAL_TABLET | Freq: Two times a day (BID) | ORAL | Status: DC

## 2012-11-23 NOTE — Patient Instructions (Addendum)
Your physician has recommended you make the following change in your medication:  -start Eliquis 2.5 mg twice daily for atrial fib  Your physician recommends that you return for lab work in: 1 month  Your physician wants you to follow-up in: 6 months with Device clinic and 1 year with Dr. Ladona Ridgel. You will receive a reminder letter in the mail two months in advance. If you don't receive a letter, please call our office to schedule the follow-up appointment.

## 2012-11-26 ENCOUNTER — Encounter: Payer: Self-pay | Admitting: Internal Medicine

## 2012-11-26 NOTE — Assessment & Plan Note (Signed)
Today I discussed her risk of thromboembolism risk in detail. I have recommended a re-trial of anticoagulation with Eliquis. She will be placed on 2.5 mg twice daily.

## 2012-11-26 NOTE — Progress Notes (Signed)
HPI [name] returns today for followup. She is a pleasant elderly woman with a h/o atrial flutter s/p catheter ablation several years ago. In the interim, she has done well. She denies chest pain or sob. She has PAF which is controlled. Previously she was on coumadin but developed a hemarthrosis. She denies palpitations or syncope.   Allergies  Allergen Reactions  . Naproxen     REACTION: gastritis     Current Outpatient Prescriptions  Medication Sig Dispense Refill  . acetaminophen (TYLENOL EX ST ARTHRITIS PAIN) 500 MG tablet Take 1-2 tablets (500-1,000 mg total) by mouth every 6 (six) hours as needed for pain.      [name] albuterol (PROVENTIL HFA;VENTOLIN HFA) 108 (90 BASE) MCG/ACT inhaler Inhale 2 puffs into the lungs every 6 (six) hours as needed for wheezing.      [name] amiodarone (PACERONE) 200 MG tablet Take 1 tablet (200 mg total) by mouth 2 (two) times daily.  60 tablet  3  . aspirin 81 MG tablet Take 81 mg by mouth daily.      [name] atorvastatin (LIPITOR) 40 MG tablet Take 1 tablet (40 mg total) by mouth daily.  90 tablet  3  . carvedilol (COREG) 25 MG tablet Take 1 tablet (25 mg total) by mouth 2 (two) times daily with a meal.  180 tablet  3  . cloNIDine (CATAPRES) 0.2 MG tablet Take 1 tablet (0.2 mg total) by mouth 2 (two) times daily.  60 tablet  6  . digoxin (LANOXIN) 0.125 MG tablet Take 1 tablet (0.125 mg total) by mouth every other day.  90 tablet  3  . docusate sodium (COLACE) 100 MG capsule Take 100 mg by mouth daily.      . feeding supplement (ENSURE COMPLETE) LIQD Take 237 mLs by mouth 3 (three) times daily between meals.      . furosemide (LASIX) 40 MG tablet Take one tablet daily alternate with one tablet twice a day.      . hydrALAZINE (APRESOLINE) 50 MG tablet Take 1 tablet (50 mg total) by mouth 3 (three) times daily.  90 tablet  3  . levalbuterol (XOPENEX HFA) 45 MCG/ACT inhaler Inhale 2 puffs into the lungs as needed.      . metFORMIN (GLUMETZA) 500 MG (MOD) 24 hr tablet  Take 1 tablet (500 mg total) by mouth 2 (two) times daily with a meal.  60 tablet  2  . nitroGLYCERIN (NITROSTAT) 0.4 MG SL tablet Place 0.4 mg under the tongue every 5 (five) minutes as needed. For chest pain      . tiotropium (SPIRIVA) 18 MCG inhalation capsule Place 18 mcg into inhaler and inhale daily.      [name] apixaban (ELIQUIS) 2.5 MG TABS tablet Take 1 tablet (2.5 mg total) by mouth 2 (two) times daily.  60 tablet  3   No current facility-administered medications for this visit.     Past Medical History  Diagnosis Date  . Coronary artery disease     a. Acute MI 2003, stent to LCx. b. NSTEMI 2008 (cath w/o PCI).  c. +trop 02/2011 felt 2/2 demand ischemia.  [name] History of atrial flutter 2009    a. s/p ablation of typical flutter 09/2007.  [name] Paroxysmal atrial fibrillation     a. Dx 2009. b. Discontinuation of Coumadin 01/2008 2/2 hemarthrosis/chronic debilitation.  [name] COPD (chronic obstructive pulmonary disease)   . Hyperlipidemia   . Diabetes mellitus   . Diastolic congestive heart failure   .  Arthritis   . Chronic kidney disease   . Carotid artery disease     a. Bilateral  . SSS (sick sinus syndrome)     a. Symptomatic bradycardia/syncope and post-termination pauses - St. Jude dual chamber PPM 09/2007.   [name] Pneumonia   . Anxiety     adjustment disorder  . Pacemaker   . Mesenteric ischemia     a. Possible dx, 2013.  . Diverticula of colon     a. Colonoscopy 01/2012.  [name] PVD (peripheral vascular disease)     Carotid dz as noted, repoted aortoiliac occlusive dz, also reported high-grade stenosis at the origin & prox innominate artery, high-grade stenosis of the right carotid bifurcation, moderate stenosis of the proximal prevertebral left subclavian artery by CT 02/2011  . Pituitary tumor     s/p gamma knife    ROS:   All systems reviewed and negative except as noted in the HPI.   Past Surgical History  Procedure Laterality Date  . Cardiac catheterization      2003 cardiac  stent  . Insert / replace / remove pacemaker  2009    St. Jude  . Cholecystectomy    . Total knee arthroplasty      bilateral  . Bilateral hip arthroscopy    . 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 Myocardial perfusion study      EF 59% 4/04  . Adenosine myoview study      abn EF 53% 12/04/04  . Carotid doppler  2/99  . Echo with lvh  1/98    old records  . Stress cardiolite  3/04    EF 53%  . Gamma knife radiosurg  08/25/06    [facility]  . Pvd      with stent in the right iliac   . Colonoscopy  01/27/2012    Procedure: COLONOSCOPY;  Surgeon: Louis Meckel, MD;  Location: [facility] ENDOSCOPY;  Service: Endoscopy;  Laterality: N/A;     Family History  Problem Relation Age of Onset  . Cancer Neg Hx   . Depression      ?  . Diabetes      DM and HBP- family hx     History   Social History  . Marital Status: Widowed    Spouse Name: N/A    Number of Children: N/A  . Years of Education: N/A   Occupational History  . Not on file.   Social History Main Topics  . Smoking status: Current Every Day Smoker -- 0.25 packs/day for 40 years    Types: Cigarettes    Last Attempt to Quit: 04/02/2012  . Smokeless tobacco: Never Used  . Alcohol Use: No  . Drug Use: No  . Sexually Active: Not on file   Other Topics Concern  . Not on file   Social History Narrative   Widowed, husband died at age 74-DM, HTN. 3 children.    Nursing assistant in past, active in church.    Son lives with her and does most of the housework and cooking. 1/10   Pt signed party release form and gives [name], granddaughter [phone]), access to medical records. Can leave msg on machine [phone]) or cell.      BP 158/60  Ht 5\' 5"  (1.651 m)  Wt 134 lb 12 oz (61.122 kg)  BMI 22.42 kg/m2  Physical Exam:  Well appearing elderly woman, NAD HEENT: Unremarkable Neck:  6 cm JVD, no  thyromegally Lungs:  Clear with no wheezes, rales, or rhonchi HEART:   Regular rate rhythm, no murmurs, no rubs, no clicks Abd:  soft, positive bowel sounds, no organomegally, no rebound, no guarding Ext:  2 plus pulses, no edema, no cyanosis, no clubbing Skin:  No rashes no nodules Neuro:  CN II through XII intact, motor grossly intact   DEVICE  Normal device function.  See PaceArt for details.  PAF is present approximately 20% of the time.  Assess/Plan:

## 2012-11-26 NOTE — Assessment & Plan Note (Signed)
Her DDD St. Jude PPM is working normally and has approximately 5 years of battery longevity.

## 2012-11-26 NOTE — Assessment & Plan Note (Signed)
Her symptoms are class 2 and appear to be compensated. Will follow.

## 2012-11-28 LAB — URINALYSIS, COMPLETE
Bilirubin,UR: NEGATIVE
Blood: NEGATIVE
Glucose,UR: NEGATIVE mg/dL (ref 0–75)
Hyaline Cast: 1
Ketone: NEGATIVE
Ph: 6 (ref 4.5–8.0)
Specific Gravity: 1.014 (ref 1.003–1.030)
Squamous Epithelial: NONE SEEN

## 2012-11-28 LAB — CBC
HCT: 36.2 % (ref 35.0–47.0)
HGB: 12.1 g/dL (ref 12.0–16.0)
MCH: 30.8 pg (ref 26.0–34.0)
Platelet: 272 10*3/uL (ref 150–440)
RDW: 18.6 % — ABNORMAL HIGH (ref 11.5–14.5)

## 2012-11-28 LAB — COMPREHENSIVE METABOLIC PANEL
Albumin: 3.6 g/dL (ref 3.4–5.0)
Alkaline Phosphatase: 104 U/L (ref 50–136)
Calcium, Total: 9 mg/dL (ref 8.5–10.1)
Chloride: 99 mmol/L (ref 98–107)
Co2: 30 mmol/L (ref 21–32)
EGFR (Non-African Amer.): 19 — ABNORMAL LOW
Glucose: 105 mg/dL — ABNORMAL HIGH (ref 65–99)
Potassium: 4.1 mmol/L (ref 3.5–5.1)
SGPT (ALT): 29 U/L (ref 12–78)
Sodium: 134 mmol/L — ABNORMAL LOW (ref 136–145)
Total Protein: 7.5 g/dL (ref 6.4–8.2)

## 2012-11-29 ENCOUNTER — Inpatient Hospital Stay: Payer: Self-pay | Admitting: Internal Medicine

## 2012-11-29 LAB — CK TOTAL AND CKMB (NOT AT ARMC)
CK, Total: 37 U/L (ref 21–215)
CK, Total: 36 U/L (ref 21–215)

## 2012-11-29 LAB — BASIC METABOLIC PANEL
BUN: 33 mg/dL — ABNORMAL HIGH (ref 7–18)
Calcium, Total: 8.3 mg/dL — ABNORMAL LOW (ref 8.5–10.1)
Co2: 30 mmol/L (ref 21–32)
EGFR (Non-African Amer.): 21 — ABNORMAL LOW
Osmolality: 280 (ref 275–301)
Potassium: 4.5 mmol/L (ref 3.5–5.1)
Sodium: 137 mmol/L (ref 136–145)

## 2012-11-29 LAB — CBC WITH DIFFERENTIAL/PLATELET
Basophil %: 0.3 %
Eosinophil #: 0.2 10*3/uL (ref 0.0–0.7)
Eosinophil %: 2.2 %
HCT: 32.3 % — ABNORMAL LOW (ref 35.0–47.0)
Lymphocyte %: 15.5 %
MCH: 31.2 pg (ref 26.0–34.0)
MCHC: 33.9 g/dL (ref 32.0–36.0)
MCV: 92 fL (ref 80–100)
Neutrophil %: 72.5 %
Platelet: 277 10*3/uL (ref 150–440)
RDW: 18.3 % — ABNORMAL HIGH (ref 11.5–14.5)
WBC: 9 10*3/uL (ref 3.6–11.0)

## 2012-11-29 LAB — LIPID PANEL: VLDL Cholesterol, Calc: 8 mg/dL (ref 5–40)

## 2012-11-29 LAB — TROPONIN I: Troponin-I: 0.2 ng/mL — ABNORMAL HIGH

## 2012-11-29 LAB — HEMOGLOBIN A1C: Hemoglobin A1C: 6.1 % (ref 4.2–6.3)

## 2012-11-29 LAB — MAGNESIUM: Magnesium: 2.1 mg/dL

## 2012-11-30 LAB — CREATININE, SERUM
Creatinine: 1.7 mg/dL — ABNORMAL HIGH (ref 0.60–1.30)
EGFR (Non-African Amer.): 27 — ABNORMAL LOW

## 2012-11-30 LAB — URINE CULTURE

## 2012-12-02 LAB — BASIC METABOLIC PANEL
BUN: 15 mg/dL (ref 7–18)
Calcium, Total: 8.3 mg/dL — ABNORMAL LOW (ref 8.5–10.1)
Chloride: 107 mmol/L (ref 98–107)
Co2: 23 mmol/L (ref 21–32)
EGFR (Non-African Amer.): 34 — ABNORMAL LOW
Potassium: 4.2 mmol/L (ref 3.5–5.1)

## 2012-12-03 LAB — CULTURE, BLOOD (SINGLE)

## 2012-12-22 ENCOUNTER — Encounter: Payer: Self-pay | Admitting: Internal Medicine

## 2012-12-25 ENCOUNTER — Other Ambulatory Visit: Payer: Medicare Other

## 2013-01-23 ENCOUNTER — Ambulatory Visit: Payer: Medicare Other | Admitting: Family Medicine

## 2013-01-25 ENCOUNTER — Encounter: Payer: Self-pay | Admitting: Family Medicine

## 2013-01-25 ENCOUNTER — Ambulatory Visit (INDEPENDENT_AMBULATORY_CARE_PROVIDER_SITE_OTHER)
Admission: RE | Admit: 2013-01-25 | Discharge: 2013-01-25 | Disposition: A | Discharge status: Home / Self Care | Payer: Medicare Other | Source: Ambulatory Visit | Attending: Family Medicine | Admitting: Family Medicine

## 2013-01-25 ENCOUNTER — Telehealth: Payer: Self-pay | Admitting: Family Medicine

## 2013-01-25 ENCOUNTER — Ambulatory Visit (INDEPENDENT_AMBULATORY_CARE_PROVIDER_SITE_OTHER): Payer: Medicare Other | Admitting: Family Medicine

## 2013-01-25 ENCOUNTER — Ambulatory Visit: Payer: Medicare Other | Admitting: Family Medicine

## 2013-01-25 VITALS — BP 170/70 | HR 64 | Temp 97.7°F | Ht 65.0 in

## 2013-01-25 DIAGNOSIS — I4891 Unspecified atrial fibrillation: Secondary | ICD-10-CM

## 2013-01-25 DIAGNOSIS — J449 Chronic obstructive pulmonary disease, unspecified: Secondary | ICD-10-CM

## 2013-01-25 DIAGNOSIS — N179 Acute kidney failure, unspecified: Secondary | ICD-10-CM

## 2013-01-25 DIAGNOSIS — R05 Cough: Secondary | ICD-10-CM

## 2013-01-25 DIAGNOSIS — I10 Essential (primary) hypertension: Secondary | ICD-10-CM

## 2013-01-25 DIAGNOSIS — I5032 Chronic diastolic (congestive) heart failure: Secondary | ICD-10-CM

## 2013-01-25 DIAGNOSIS — E860 Dehydration: Secondary | ICD-10-CM

## 2013-01-25 DIAGNOSIS — N3090 Cystitis, unspecified without hematuria: Secondary | ICD-10-CM

## 2013-01-25 LAB — COMPREHENSIVE METABOLIC PANEL
ALT: 47 U/L — ABNORMAL HIGH (ref 0–35)
BUN: 26 mg/dL — ABNORMAL HIGH (ref 6–23)
CO2: 30 mEq/L (ref 19–32)
Calcium: 8.4 mg/dL (ref 8.4–10.5)
Chloride: 101 mEq/L (ref 96–112)
Creatinine, Ser: 1.5 mg/dL — ABNORMAL HIGH (ref 0.4–1.2)
GFR: 42.57 mL/min — ABNORMAL LOW (ref >60.00)
Total Bilirubin: 0.5 mg/dL (ref 0.3–1.2)

## 2013-01-25 LAB — CBC WITH DIFFERENTIAL/PLATELET
Basophils Absolute: 0 10*3/uL (ref 0.0–0.1)
Basophils Relative: 0.4 % (ref 0.0–3.0)
Eosinophils Relative: 0.6 % (ref 0.0–5.0)
HCT: 32 % — ABNORMAL LOW (ref 36.0–46.0)
Hemoglobin: 10.5 g/dL — ABNORMAL LOW (ref 12.0–15.0)
Lymphocytes Relative: 11.2 % — ABNORMAL LOW (ref 12.0–46.0)
Lymphs Abs: 0.8 10*3/uL (ref 0.7–4.0)
Monocytes Relative: 7.7 % (ref 3.0–12.0)
Neutro Abs: 5.7 10*3/uL (ref 1.4–7.7)
RBC: 3.33 Mil/uL — ABNORMAL LOW (ref 3.87–5.11)
WBC: 7.1 10*3/uL (ref 4.5–10.5)

## 2013-01-25 MED ORDER — DOXYCYCLINE HYCLATE 100 MG PO TABS: 100.0000 mg | ORAL_TABLET | Freq: Two times a day (BID) | ORAL | Status: DC

## 2013-01-25 MED ORDER — ALBUTEROL SULFATE HFA 108 (90 BASE) MCG/ACT IN AERS: 2.0000 | INHALATION_SPRAY | Freq: Four times a day (QID) | RESPIRATORY_TRACT | Status: DC | PRN

## 2013-01-25 MED ORDER — TIOTROPIUM BROMIDE MONOHYDRATE 18 MCG IN CAPS: 18.0000 ug | ORAL_CAPSULE | Freq: Every day | RESPIRATORY_TRACT | Status: DC

## 2013-01-25 NOTE — Assessment & Plan Note (Signed)
Rate controlled 

## 2013-01-25 NOTE — Assessment & Plan Note (Signed)
Fluid overload... Restart lasix if Cr back at baseline. Restart compression hose.

## 2013-01-25 NOTE — Assessment & Plan Note (Signed)
Likely due to dehydration and cystitis. Re-eval creatinine today.

## 2013-01-25 NOTE — Assessment & Plan Note (Signed)
Inadequate control, appears per records pt has struggled with this in the past. She is compliant with meds now given at assisted living.  She is fluid overload. Will start back lasix if creatinine returned to baseline at check today.

## 2013-01-25 NOTE — Telephone Encounter (Signed)
Notify [facility] and pt caregiver.Marland KitchenMarland KitchenNew small left pleural effusion is seen with associated left basilar atelectasis. There is also mild atelectasis or infiltrate at the right lung base, without definite right pleural effusion.  ie. concerning for pneumonia... Will start antibiotics. Lupita Leash.. Call in/ escribe or fax rx to correct place please. If SOB, fever... Go to ER. Have pt follow up with PCP on Monday as well.

## 2013-01-25 NOTE — Assessment & Plan Note (Signed)
Resolved

## 2013-01-25 NOTE — Patient Instructions (Addendum)
Stop at the lab/CXR on way out. We will call with the results.  Get back on compression hose. Start back on lasix but at lower dose. Call cardiologist Dr. Mariah Milling to determine if she needs to restart eliquis for anticoagulation. Follow up in 2 weeks with PCP.

## 2013-01-25 NOTE — Telephone Encounter (Signed)
Spoke with  Liborio Nixon (Caregiver).  Advised chest x-ray shows concern for pneumonia.  Doxycycline has been sent to pharmacy.  Follow up appointment scheduled with Dr. Dayton Martes on 01/31/2013 @ 10:30.  If patient gets worse, or has fever, SOB to please take her to the ER.  Liborio Nixon states understanding.

## 2013-01-25 NOTE — Assessment & Plan Note (Signed)
Poor control... Restart spiriva and albuterol prn as she was on previously. Unclear why stopped after hospitalization.

## 2013-01-25 NOTE — Assessment & Plan Note (Signed)
Resolved s/p antibiotics. Asymptomatic per pt and no change in urine per caregiver.

## 2013-01-25 NOTE — Progress Notes (Signed)
Subjective:    Patient ID: [name], female    DOB: 1924-03-23, 79 y.o.   MRN: [id number]  HPI  79 year old female pt of Dr. Elmer Sow new to me with history of Afib, tachybrady, PVD, carotid stenosis, afib, CAd, CHF with chronic edema presents for hospital follow up. She presents with Liborio Nixon from [facility] assisted living. ( caregiver notes some confusion and likely dementia, unclear if this is new or old, not on past prob list)  She was admitted in 7/16-7/19 for altered mental status, frequent falls felt to be due to cystitis and dehydration, went to rehab then to assissted living.  While in hospital had malignant HTN thought to be due to noncompliance of meds.  Neg head CT, nml EKG, neg pelvic and left hip X-ray. Creatinine 2.23... Given IVF. She was at [facility]. She has been at [facility] sine 9/4.  She has been off lasix since hospitalization... Likely given ARF and dehydration.  Her swelling is now worse.  She has been seeing Dr. Mariah Milling  For CHF etc... At last OV in 11/2012 kidney function had declined so lasix was limited to twice daily  Cardiology discontinued  Coumadin in September of 09 secondary to bleeding.  She has had some chest congestion in last few days.. Cough productive but no cahnge in sputum. No SOB.  Hx of COPD. More weak lately.    Review of Systems  Constitutional: Positive for fatigue. Negative for fever.  HENT: Positive for congestion. Negative for ear pain.        Chest congestion  In last few days.  Eyes: Negative for pain.  Respiratory: Positive for cough and shortness of breath. Negative for chest tightness.   Cardiovascular: Positive for leg swelling. Negative for chest pain and palpitations.  Gastrointestinal: Negative for abdominal pain.  Genitourinary: Negative for dysuria.  Neurological:       Some increase in confusion       Objective:   Physical Exam  Constitutional: She is oriented to person, place, and time. Vital signs are normal.  She appears well-developed and well-nourished. She is cooperative.  Non-toxic appearance. She does not appear ill. No distress.  Elderly female in wheelchair  HENT:  Head: Normocephalic.  Right Ear: Hearing, tympanic membrane, external ear and ear canal normal. Tympanic membrane is not erythematous, not retracted and not bulging.  Left Ear: Hearing, tympanic membrane, external ear and ear canal normal. Tympanic membrane is not erythematous, not retracted and not bulging.  Nose: No mucosal edema or rhinorrhea. Right sinus exhibits no maxillary sinus tenderness and no frontal sinus tenderness. Left sinus exhibits no maxillary sinus tenderness and no frontal sinus tenderness.  Mouth/Throat: Uvula is midline, oropharynx is clear and moist and mucous membranes are normal.  Eyes: Conjunctivae, EOM and lids are normal. Pupils are equal, round, and reactive to light. Lids are everted and swept, no foreign bodies found.  Neck: Trachea normal and normal range of motion. Neck supple. Carotid bruit is not present. No mass and no thyromegaly present.  Cardiovascular: Normal rate, regular rhythm, S1 normal, S2 normal, normal heart sounds, intact distal pulses and normal pulses.  Exam reveals no gallop and no friction rub.   No murmur heard. Pulmonary/Chest: Effort normal. Not tachypneic. No respiratory distress. She has no decreased breath sounds. She has no wheezes. She has rhonchi. She has rales.  Rales and rhonchi at B bases, right >left  Abdominal: Soft. Normal appearance and bowel sounds are normal. There is no tenderness.  Neurological:  She is alert and oriented to person, place, and time. No cranial nerve deficit or sensory deficit. She exhibits abnormal muscle tone. Gait abnormal.  Too weak to walk Sleeping through some of exam but answers questions   Skin: Skin is warm, dry and intact. No rash noted.  Psychiatric: Her speech is normal and behavior is normal. Judgment and thought content normal. Her  mood appears not anxious. Cognition and memory are normal. She does not exhibit a depressed mood.          Assessment & Plan:  Completed FL2 and PRN oreders for assisted living center as well.   She needs to return for close follow up with PCP for multipel chronic issues. Total visit time 45 minutes, > 50% spent counseling and cordinating patients care.

## 2013-01-25 NOTE — Assessment & Plan Note (Signed)
Unclear baseline but given weakness with check labs and CXR for ongoing infection.

## 2013-01-26 ENCOUNTER — Inpatient Hospital Stay: Payer: Self-pay | Admitting: Internal Medicine

## 2013-01-26 LAB — CBC
HCT: 29.8 % — ABNORMAL LOW (ref 35.0–47.0)
MCH: 31.7 pg (ref 26.0–34.0)
Platelet: 350 10*3/uL (ref 150–440)
RBC: 3.14 10*6/uL — ABNORMAL LOW (ref 3.80–5.20)
RDW: 16.5 % — ABNORMAL HIGH (ref 11.5–14.5)
WBC: 7.3 10*3/uL (ref 3.6–11.0)

## 2013-01-26 LAB — BASIC METABOLIC PANEL
Anion Gap: 4 — ABNORMAL LOW (ref 7–16)
BUN: 25 mg/dL — ABNORMAL HIGH (ref 7–18)
Calcium, Total: 8.3 mg/dL — ABNORMAL LOW (ref 8.5–10.1)
Creatinine: 1.63 mg/dL — ABNORMAL HIGH (ref 0.60–1.30)
EGFR (African American): 33 — ABNORMAL LOW
EGFR (Non-African Amer.): 28 — ABNORMAL LOW
Osmolality: 282 (ref 275–301)

## 2013-01-27 LAB — CBC WITH DIFFERENTIAL/PLATELET
Basophil %: 0.6 %
Eosinophil #: 0.1 10*3/uL (ref 0.0–0.7)
HCT: 30.6 % — ABNORMAL LOW (ref 35.0–47.0)
HGB: 10.2 g/dL — ABNORMAL LOW (ref 12.0–16.0)
MCHC: 33.3 g/dL (ref 32.0–36.0)
MCV: 96 fL (ref 80–100)
Monocyte #: 0.8 x10 3/mm (ref 0.2–0.9)
Monocyte %: 12.5 %
Neutrophil %: 71.5 %
Platelet: 324 10*3/uL (ref 150–440)
RDW: 16.1 % — ABNORMAL HIGH (ref 11.5–14.5)
WBC: 6.5 10*3/uL (ref 3.6–11.0)

## 2013-01-27 LAB — BASIC METABOLIC PANEL
Anion Gap: 5 — ABNORMAL LOW (ref 7–16)
Calcium, Total: 8.1 mg/dL — ABNORMAL LOW (ref 8.5–10.1)
Chloride: 102 mmol/L (ref 98–107)
EGFR (Non-African Amer.): 28 — ABNORMAL LOW
Glucose: 104 mg/dL — ABNORMAL HIGH (ref 65–99)
Osmolality: 280 (ref 275–301)
Potassium: 3.8 mmol/L (ref 3.5–5.1)

## 2013-01-28 LAB — CBC WITH DIFFERENTIAL/PLATELET
Basophil #: 0 10*3/uL (ref 0.0–0.1)
Eosinophil #: 0 10*3/uL (ref 0.0–0.7)
Lymphocyte #: 1.1 10*3/uL (ref 1.0–3.6)
MCH: 32.3 pg (ref 26.0–34.0)
MCV: 95 fL (ref 80–100)
Neutrophil #: 4.2 10*3/uL (ref 1.4–6.5)
RDW: 16.4 % — ABNORMAL HIGH (ref 11.5–14.5)
WBC: 6.2 10*3/uL (ref 3.6–11.0)

## 2013-01-28 LAB — BASIC METABOLIC PANEL
Anion Gap: 6 — ABNORMAL LOW (ref 7–16)
Calcium, Total: 8 mg/dL — ABNORMAL LOW (ref 8.5–10.1)
Chloride: 101 mmol/L (ref 98–107)
Creatinine: 1.63 mg/dL — ABNORMAL HIGH (ref 0.60–1.30)
EGFR (African American): 33 — ABNORMAL LOW
Osmolality: 278 (ref 275–301)
Potassium: 3.7 mmol/L (ref 3.5–5.1)
Sodium: 138 mmol/L (ref 136–145)

## 2013-01-31 ENCOUNTER — Encounter: Payer: Self-pay | Admitting: Family Medicine

## 2013-01-31 ENCOUNTER — Ambulatory Visit (INDEPENDENT_AMBULATORY_CARE_PROVIDER_SITE_OTHER): Payer: Medicare Other | Admitting: Family Medicine

## 2013-01-31 VITALS — BP 138/44 | HR 60 | Temp 97.6°F

## 2013-01-31 DIAGNOSIS — N183 Chronic kidney disease, stage 3 unspecified: Secondary | ICD-10-CM

## 2013-01-31 DIAGNOSIS — R531 Weakness: Secondary | ICD-10-CM

## 2013-01-31 DIAGNOSIS — F4323 Adjustment disorder with mixed anxiety and depressed mood: Secondary | ICD-10-CM

## 2013-01-31 DIAGNOSIS — I4891 Unspecified atrial fibrillation: Secondary | ICD-10-CM

## 2013-01-31 DIAGNOSIS — J9600 Acute respiratory failure, unspecified whether with hypoxia or hypercapnia: Secondary | ICD-10-CM | POA: Insufficient documentation

## 2013-01-31 DIAGNOSIS — R5381 Other malaise: Secondary | ICD-10-CM

## 2013-01-31 DIAGNOSIS — R0602 Shortness of breath: Secondary | ICD-10-CM

## 2013-01-31 DIAGNOSIS — J189 Pneumonia, unspecified organism: Secondary | ICD-10-CM

## 2013-01-31 DIAGNOSIS — I5032 Chronic diastolic (congestive) heart failure: Secondary | ICD-10-CM

## 2013-01-31 LAB — BASIC METABOLIC PANEL
Calcium: 8.2 mg/dL — ABNORMAL LOW (ref 8.4–10.5)
Creatinine, Ser: 1.9 mg/dL — ABNORMAL HIGH (ref 0.4–1.2)
GFR: 32.36 mL/min — ABNORMAL LOW (ref >60.00)
Sodium: 136 mEq/L (ref 135–145)

## 2013-01-31 LAB — BRAIN NATRIURETIC PEPTIDE: Pro B Natriuretic peptide (BNP): 1311 pg/mL — ABNORMAL HIGH (ref 0.0–100.0)

## 2013-01-31 MED ORDER — MIRTAZAPINE 15 MG PO TABS: 15.0000 mg | ORAL_TABLET | Freq: Every day | ORAL | Status: DC

## 2013-01-31 NOTE — Patient Instructions (Addendum)
We are starting remeron 15 mg nightly.  We will call you with your lab results.

## 2013-01-31 NOTE — Progress Notes (Signed)
Subjective:    Patient ID: [name], female    DOB: 1923-10-22, 79 y.o.   MRN: [id number]  HPI  79 year old pleasant female with complicated history, including Afib, tachybrady, PVD, carotid stenosis, afib, Cad, CHF, CKD here for hospital follow up with director of her ALF.     This is her second admission to [facility] in 2 months.  She has had a decline over last several months.  Part of the issue seems to be that some of her medications were not continued after last discharge when she was sent to SNF and then to ALF.  Notes reviewed from recent admission. Admitted to [facility] 9/12- 01/29/2013 for generalized weakness, cough and SOB. Discharge note reviewed.  Saw my partner, Dr. Ermalene Searing the day prior, who appropriately began treatment for PNA with po doxycycline (see CXR) and CHF exacerbation with lasix and compression hose.  Advised ER if she developed fever or worsening symptoms which she did.  She is currently a resident of ALF.  Discharge note states that she was "treated with IV lasix and IV abx with improvement of symptoms," although does not specify type of abx or dosing.   Discharged back to ALF on Levaquin 750 mg daily x 5 days.  Cr 1.63 at discharged.  Spiriva was restarted.  Wheezing has resolved.  Has not needed to use rescue inhaler.   LE edema improved and breathing has improved. She has been very tearful and not wanting to get out of bed.   PT/OT restarted.  Eliquis stopped, now on ASA 81 mg daily.  More tearful.  Appetite fair.  Could not stand to be weighed today.  Patient Active Problem List   Diagnosis Date Noted  . PNA (pneumonia) 01/31/2013  . ARF (acute respiratory failure) 01/31/2013  . Generalized weakness 01/31/2013  . Adjustment disorder with mixed anxiety and depressed mood 01/31/2013  . ARF (acute renal failure) 01/25/2013  . Hypotension 04/04/2012  . CKD (chronic kidney disease) stage 3, GFR 30-59 ml/min 04/04/2012  . Dehydration 04/04/2012  .  SMA stenosis-moderate 04/04/2012  . Carotid stenosis 04/04/2012  . ?? Subclavian artery stenosis, right 04/04/2012  . Mild protein-calorie malnutrition 02/10/2012  . PVD (peripheral vascular disease)   . Tachy-brady syndrome   . Hyperlipidemia   . Diastolic congestive heart failure   . Carotid artery disease   . Diverticulosis of colon (without mention of hemorrhage) 01/27/2012  . Nonspecific (abnormal) findings on radiological and other examination of gastrointestinal tract 01/25/2012  . Weight loss 11/02/2011  . DEGENERATIVE JOINT DISEASE, ADVANCED 10/30/2009  . Atrial fibrillation 09/15/2009  . Cardiac pacemaker in situ 09/15/2009  . Adjustment disorder with anxiety 07/09/2009  . CAD, NATIVE VESSEL-s/p stents 06/19/2009  . PURE HYPERCHOLESTEROLEMIA 12/24/2008  . FATIGUE 12/17/2008  . ATHEROSCLEROSIS W /INT CLAUDICATION 11/29/2008  . Chronic diastolic heart failure 01/04/2008  . ADRENAL MASS, BILATERAL 12/18/2007  . LYMPHADENOPATHY 12/18/2007  . TOBACCO ABUSE 04/26/2007  . DEGENERATIVE JOINT DISEASE, LEFT HIP 04/26/2007  . ARTHRITIS 04/26/2007  . CAROTID BRUIT, RIGHT 04/26/2007  . MICROALBUMINURIA 04/26/2007  . history of PITUITARY ADENOMA- s/p gamma knife 10/27/2006  . HYPERTENSION, BENIGN ESSENTIAL 10/27/2006  . KNEE PAIN, BILATERAL 10/27/2006   Past Medical History  Diagnosis Date  . Coronary artery disease     a. Acute MI 2003, stent to LCx. b. NSTEMI 2008 (cath w/o PCI).  c. +trop 02/2011 felt 2/2 demand ischemia.  [name] History of atrial flutter 2009    a. s/p ablation of  typical flutter 09/2007.  [name] Paroxysmal atrial fibrillation     a. Dx 2009. b. Discontinuation of Coumadin 01/2008 2/2 hemarthrosis/chronic debilitation.  [name] COPD (chronic obstructive pulmonary disease)   . Hyperlipidemia   . Diabetes mellitus   . Diastolic congestive heart failure   . Arthritis   . Chronic kidney disease   . Carotid artery disease     a. Bilateral  . SSS (sick sinus syndrome)     a.  Symptomatic bradycardia/syncope and post-termination pauses - St. Jude dual chamber PPM 09/2007.   [name] Pneumonia   . Anxiety     adjustment disorder  . Pacemaker   . Mesenteric ischemia     a. Possible dx, 2013.  . Diverticula of colon     a. Colonoscopy 01/2012.  [name] PVD (peripheral vascular disease)     Carotid dz as noted, repoted aortoiliac occlusive dz, also reported high-grade stenosis at the origin & prox innominate artery, high-grade stenosis of the right carotid bifurcation, moderate stenosis of the proximal prevertebral left subclavian artery by CT 02/2011  . Pituitary tumor     s/p gamma knife   Past Surgical History  Procedure Laterality Date  . Cardiac catheterization      2003 cardiac stent  . Insert / replace / remove pacemaker  2009    St. Jude  . Cholecystectomy    . Total knee arthroplasty      bilateral  . Bilateral hip arthroscopy    . 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 Myocardial perfusion study      EF 59% 4/04  . Adenosine myoview study      abn EF 53% 12/04/04  . Carotid doppler  2/99  . Echo with lvh  1/98    old records  . Stress cardiolite  3/04    EF 53%  . Gamma knife radiosurg  08/25/06    [facility]  . Pvd      with stent in the right iliac   . Colonoscopy  01/27/2012    Procedure: COLONOSCOPY;  Surgeon: Louis Meckel, MD;  Location: [facility] ENDOSCOPY;  Service: Endoscopy;  Laterality: N/A;   History  Substance Use Topics  . Smoking status: Current Every Day Smoker -- 0.25 packs/day for 40 years    Types: Cigarettes    Last Attempt to Quit: 04/02/2012  . Smokeless tobacco: Current User    Types: Snuff  . Alcohol Use: No   Family History  Problem Relation Age of Onset  . Cancer Neg Hx   . Depression      ?  . Diabetes      DM and HBP- family hx   Allergies  Allergen Reactions  . Naproxen     REACTION: gastritis   Current Outpatient Prescriptions on File Prior to Visit  Medication Sig Dispense  Refill  . acetaminophen (TYLENOL EX ST ARTHRITIS PAIN) 500 MG tablet Take 1-2 tablets (500-1,000 mg total) by mouth every 6 (six) hours as needed for pain.      [name] albuterol (PROVENTIL HFA;VENTOLIN HFA) 108 (90 BASE) MCG/ACT inhaler Inhale 2 puffs into the lungs every 6 (six) hours as needed for wheezing.  3.7 g  3  . albuterol (PROVENTIL HFA;VENTOLIN HFA) 108 (90 BASE) MCG/ACT inhaler Inhale 2 puffs into the lungs every 6 (six) hours as needed for wheezing.  3.7 g  3  . amiodarone (PACERONE) 200 MG tablet Take 1 tablet (200 mg total) by mouth  2 (two) times daily.  60 tablet  3  . apixaban (ELIQUIS) 2.5 MG TABS tablet Take 1 tablet (2.5 mg total) by mouth 2 (two) times daily.  60 tablet  3  . aspirin 81 MG tablet Take 81 mg by mouth daily.      [name] atorvastatin (LIPITOR) 40 MG tablet Take 1 tablet (40 mg total) by mouth daily.  90 tablet  3  . carvedilol (COREG) 25 MG tablet Take 1 tablet (25 mg total) by mouth 2 (two) times daily with a meal.  180 tablet  3  . cloNIDine (CATAPRES) 0.2 MG tablet Take 1 tablet (0.2 mg total) by mouth 2 (two) times daily.  60 tablet  6  . digoxin (LANOXIN) 0.125 MG tablet Take 1 tablet (0.125 mg total) by mouth every other day.  90 tablet  3  . docusate sodium (COLACE) 100 MG capsule Take 100 mg by mouth daily.      . furosemide (LASIX) 40 MG tablet Take one tablet daily alternate with one tablet twice a day.      . hydrALAZINE (APRESOLINE) 50 MG tablet Take 1 tablet (50 mg total) by mouth 3 (three) times daily.  90 tablet  3  . HYDROcodone-acetaminophen (NORCO/VICODIN) 5-325 MG per tablet Take 1 tablet by mouth every 4 (four) hours as needed for pain.      [name] insulin aspart (NOVOLOG FLEXPEN) 100 UNIT/ML SOPN FlexPen Inject into the skin as directed.      . nitroGLYCERIN (NITROSTAT) 0.4 MG SL tablet Place 0.4 mg under the tongue every 5 (five) minutes as needed. For chest pain      . ranitidine (ZANTAC) 150 MG capsule Take 150 mg by mouth every morning.      . tiotropium  (SPIRIVA) 18 MCG inhalation capsule Place 1 capsule (18 mcg total) into inhaler and inhale daily.  30 capsule  11   No current facility-administered medications on file prior to visit.   The PMH, PSH, Social History, Family History, Medications, and allergies have been reviewed in [facility], and have been updated if relevant.     Review of Systems  Constitutional: Positive for fatigue. Negative for fever.  Positive for bilateral leg pain.       Objective:   Physical Exam  BP 138/44  Pulse 60  Temp(Src) 97.6 F (36.4 C)  SpO2 97%   Constitutional: She is oriented to person, place, and time. Vital signs are normal. She appears well-developed and well-nourished. She is cooperative.  Non-toxic appearance. She does not appear ill. No distress.  Elderly female in wheelchair  HENT:  Head: Normocephalic.  Right Ear: Hearing, tympanic membrane, external ear and ear canal normal. Tympanic membrane is not erythematous, not retracted and not bulging.  Left Ear: Hearing, tympanic membrane, external ear and ear canal normal. Tympanic membrane is not erythematous, not retracted and not bulging.  Nose: No mucosal edema or rhinorrhea. Right sinus exhibits no maxillary sinus tenderness and no frontal sinus tenderness. Left sinus exhibits no maxillary sinus tenderness and no frontal sinus tenderness.  Mouth/Throat: Uvula is midline, oropharynx is clear and moist and mucous membranes are normal.  Eyes: Conjunctivae, EOM and lids are normal. Pupils are equal, round, and reactive to light. Lids are everted and swept, no foreign bodies found.  Neck: Trachea normal and normal range of motion. Neck supple. Carotid bruit is not present. No mass and no thyromegaly present.  Cardiovascular: Normal rate, regular rhythm, S1 normal, S2 normal, normal heart sounds, intact distal  pulses and normal pulses.  Exam reveals no gallop and no friction rub.   No murmur heard. Pulmonary/Chest:  CTA bilaterally Abdominal:  Soft. Normal appearance and bowel sounds are normal. There is no tenderness.  Neurological: She is alert and oriented to person, place, and time. Sleeping through some of exam but answers questions  Skin: Skin is warm, dry and intact. No rash noted.  Psychiatric: tearful, appears depressed. Her speech is normal and behavior is normal. Judgment and thought content normal. Her mood appears not anxious. Cognition and memory are normal.  Ext:  Compression hose in place, edema improved     Assessment & Plan:  1. PNA (pneumonia) Resolving.  Lungs clear today.  2. ARF (acute respiratory failure) Acute on chronic. Will recheck cr.  3. Chronic diastolic heart failure Improved back on lasix. Recheck renal function and BNP today. - Basic Metabolic Panel - Brain natriuretic peptide   4. Generalized weakness Progressive but she is working with PT/OT  5. Atrial fibrillation Rate controlled. On ASA 81 mg daily- agree with d/c eliquis given fall risk.  6. Adjustment disorder with mixed anxiety and depressed mood Adjusting to new ALF. Will start Remeron 15 mg qhs which will hopefully also help with appetite.

## 2013-02-01 ENCOUNTER — Telehealth: Payer: Self-pay | Admitting: *Deleted

## 2013-02-01 NOTE — Telephone Encounter (Signed)
Forms signed and on my desk

## 2013-02-01 NOTE — Telephone Encounter (Signed)
Form for diabetic supplies is on your desk.  Pt stated she wanted these supplies back in march, thsi is for a 6 month recert.  She company pt used before has been taken over by [employer].

## 2013-02-01 NOTE — Telephone Encounter (Signed)
Form faxed

## 2013-02-02 ENCOUNTER — Telehealth: Payer: Self-pay | Admitting: Family Medicine

## 2013-02-02 NOTE — Telephone Encounter (Signed)
Ok to give verbal order as requested. 

## 2013-02-02 NOTE — Telephone Encounter (Signed)
[name] with [facility] called and would like you to place a continuing order for occupational therapy 2 x week for 4 weeks.  Patient was discharged from [facility] on 01/29/13.  Best number to reach [name] at [facility] is [phone]

## 2013-02-02 NOTE — Telephone Encounter (Signed)
Left message with [name] at [facility] with results

## 2013-02-05 ENCOUNTER — Telehealth: Payer: Self-pay

## 2013-02-05 NOTE — Telephone Encounter (Signed)
Nurse with [facility] left v/m pt recently in hospital with CHF and pneumonia;Pt's upper leg and knees are more swollen; lower legs look pretty good, no change in breathing; elevating pt's legs.Pt is sleeping more today. Pt taking Lasix 40 mg daily. Pt is presently at [facility].Please advise.  Orders can be faxed to [facility] at [phone]. [facility] nurse request cb.

## 2013-02-05 NOTE — Telephone Encounter (Signed)
Would probably not push the diuretics more than she takes currently given creatinine 1.9 several days ago. This is at the high end of her range. Cannot explain the swelling in the upper part of her legs. Possible dependent edema?  She has not needed much diuretics in the past to dry up her kidneys. Twice a day was too much in the past.

## 2013-02-05 NOTE — Telephone Encounter (Signed)
I would not changed lasix dose based on her kidney function unless she is symptomatic.  Will route to cardiologist too for his opinion.

## 2013-02-05 NOTE — Telephone Encounter (Signed)
Pam at [facility] notified by telephone.

## 2013-02-06 NOTE — Telephone Encounter (Signed)
Thank you.  Please let Pam at [facility] know that Dr. Mariah Milling is in agreement with current plan.  Please also read his note to her.

## 2013-02-06 NOTE — Telephone Encounter (Signed)
Reviewed with Elita Quick, RN at [facility].

## 2013-02-07 ENCOUNTER — Telehealth: Payer: Self-pay | Admitting: *Deleted

## 2013-02-07 NOTE — Telephone Encounter (Signed)
See Dr. Windell Hummingbird note.  He was concerned about increasing her Lasix given her kidney function.  Please ask Pam to call Dr. Windell Hummingbird office for further advise.  If necessary we can given her a lower dose of lasix in the afternoon but I would like Dr. Windell Hummingbird input.

## 2013-02-07 NOTE — Telephone Encounter (Signed)
Pam with [facility] left voicemail saying pt is on lasix 40 mg qd, pt was recently hospitalized for pneumonia and CHF, pt's legs/ knees are swelling. Pt is at [facility] nursing home and they are elevating her legs and using her ted hose so her lower legs are not swelling. Pt is also wheezing more, Pam needs to know what to do and would need an order faxed to [phone]  Dr. Dayton Martes out of office, so sent to Dr. Milinda Antis also

## 2013-02-07 NOTE — Telephone Encounter (Signed)
Called Pam back and received her voicemail stating not to leave a message if it were after 5:00 and to call [phone]. Called and spoke to [place] (nurse on call) at [facility]. Information given to [facility] as instructed. She advised that she will take care of this and notify the appropriate person.

## 2013-02-07 NOTE — Telephone Encounter (Signed)
Dr Dayton Martes responded - I would agree with that

## 2013-02-08 ENCOUNTER — Telehealth: Payer: Self-pay

## 2013-02-08 NOTE — Telephone Encounter (Signed)
Spoke w/ Pam at [facility].  She states that Dr. Mariah Milling was communicating with their office about pt's upper leg swelling. She states that swelling has markedly decreased, pt denies sob or changes in her breathing.  Pt was lethargic yesterday, but was compacted. States that overall pt is feeling better and she will call us directly if needed.

## 2013-02-09 ENCOUNTER — Ambulatory Visit: Payer: Medicare Other | Admitting: Family Medicine

## 2013-02-09 LAB — LIPASE, BLOOD: Lipase: 53 U/L — ABNORMAL LOW (ref 73–393)

## 2013-02-09 LAB — CBC
HCT: 31.7 % — ABNORMAL LOW (ref 35.0–47.0)
MCH: 31.6 pg (ref 26.0–34.0)
MCHC: 33.3 g/dL (ref 32.0–36.0)
MCV: 95 fL (ref 80–100)
RDW: 14.9 % — ABNORMAL HIGH (ref 11.5–14.5)
WBC: 11.6 10*3/uL — ABNORMAL HIGH (ref 3.6–11.0)

## 2013-02-09 LAB — URINALYSIS, COMPLETE
Bacteria: NONE SEEN
Glucose,UR: NEGATIVE mg/dL (ref 0–75)
Ketone: NEGATIVE
Ph: 6 (ref 4.5–8.0)
RBC,UR: <1 /HPF (ref 0–5)
Specific Gravity: 1.012 (ref 1.003–1.030)
Squamous Epithelial: NONE SEEN
WBC UR: 1 /HPF (ref 0–5)

## 2013-02-09 LAB — COMPREHENSIVE METABOLIC PANEL
Albumin: 2.8 g/dL — ABNORMAL LOW (ref 3.4–5.0)
Alkaline Phosphatase: 99 U/L (ref 50–136)
Calcium, Total: 8.2 mg/dL — ABNORMAL LOW (ref 8.5–10.1)
Chloride: 101 mmol/L (ref 98–107)
Creatinine: 1.94 mg/dL — ABNORMAL HIGH (ref 0.60–1.30)
EGFR (African American): 26 — ABNORMAL LOW
EGFR (Non-African Amer.): 23 — ABNORMAL LOW
Glucose: 122 mg/dL — ABNORMAL HIGH (ref 65–99)
Osmolality: 279 (ref 275–301)
Potassium: 4.2 mmol/L (ref 3.5–5.1)
SGOT(AST): 43 U/L — ABNORMAL HIGH (ref 15–37)
Sodium: 136 mmol/L (ref 136–145)
Total Protein: 7.1 g/dL (ref 6.4–8.2)

## 2013-02-09 LAB — CK TOTAL AND CKMB (NOT AT ARMC): CK-MB: 2.5 ng/mL (ref 0.5–3.6)

## 2013-02-09 LAB — TROPONIN I: Troponin-I: 1.6 ng/mL — ABNORMAL HIGH

## 2013-02-10 ENCOUNTER — Inpatient Hospital Stay: Payer: Self-pay | Admitting: Family Medicine

## 2013-02-10 DIAGNOSIS — I369 Nonrheumatic tricuspid valve disorder, unspecified: Secondary | ICD-10-CM

## 2013-02-10 DIAGNOSIS — R9431 Abnormal electrocardiogram [ECG] [EKG]: Secondary | ICD-10-CM

## 2013-02-10 DIAGNOSIS — I5032 Chronic diastolic (congestive) heart failure: Secondary | ICD-10-CM

## 2013-02-10 LAB — CK TOTAL AND CKMB (NOT AT ARMC)
CK, Total: 118 U/L (ref 21–215)
CK, Total: 104 U/L (ref 21–215)
CK-MB: 1.8 ng/mL (ref 0.5–3.6)

## 2013-02-10 LAB — TROPONIN I
Troponin-I: 1.7 ng/mL — ABNORMAL HIGH
Troponin-I: 1.4 ng/mL — ABNORMAL HIGH

## 2013-02-11 DIAGNOSIS — I219 Acute myocardial infarction, unspecified: Secondary | ICD-10-CM

## 2013-02-11 LAB — LIPID PANEL
Cholesterol: 90 mg/dL (ref 0–200)
Triglycerides: 41 mg/dL (ref 0–200)
VLDL Cholesterol, Calc: 8 mg/dL (ref 5–40)

## 2013-02-11 LAB — BASIC METABOLIC PANEL
Anion Gap: 6 — ABNORMAL LOW (ref 7–16)
Calcium, Total: 7.8 mg/dL — ABNORMAL LOW (ref 8.5–10.1)
Co2: 28 mmol/L (ref 21–32)
EGFR (Non-African Amer.): 23 — ABNORMAL LOW
Glucose: 88 mg/dL (ref 65–99)
Potassium: 4.7 mmol/L (ref 3.5–5.1)

## 2013-02-11 LAB — PLATELET COUNT: Platelet: 236 10*3/uL (ref 150–440)

## 2013-02-11 LAB — DIGOXIN LEVEL: Digoxin: 3.2 ng/mL

## 2013-02-11 LAB — APTT: Activated PTT: 30.2 secs (ref 23.6–35.9)

## 2013-02-12 LAB — CBC WITH DIFFERENTIAL/PLATELET
Basophil #: 0.1 10*3/uL (ref 0.0–0.1)
Basophil %: 0.7 %
Eosinophil #: 0.1 10*3/uL (ref 0.0–0.7)
Eosinophil %: 0.7 %
HCT: 24.7 % — ABNORMAL LOW (ref 35.0–47.0)
HGB: 8.4 g/dL — ABNORMAL LOW (ref 12.0–16.0)
Lymphocyte #: 1 10*3/uL (ref 1.0–3.6)
Lymphocyte %: 12 %
MCH: 32.1 pg (ref 26.0–34.0)
MCV: 94 fL (ref 80–100)
Monocyte %: 14.1 %
Neutrophil #: 5.8 10*3/uL (ref 1.4–6.5)
Platelet: 229 10*3/uL (ref 150–440)
RBC: 2.63 10*6/uL — ABNORMAL LOW (ref 3.80–5.20)
RDW: 14.7 % — ABNORMAL HIGH (ref 11.5–14.5)
WBC: 8.1 10*3/uL (ref 3.6–11.0)

## 2013-02-12 LAB — BASIC METABOLIC PANEL
Anion Gap: 2 — ABNORMAL LOW (ref 7–16)
BUN: 33 mg/dL — ABNORMAL HIGH (ref 7–18)
Co2: 31 mmol/L (ref 21–32)
Creatinine: 1.88 mg/dL — ABNORMAL HIGH (ref 0.60–1.30)
Glucose: 111 mg/dL — ABNORMAL HIGH (ref 65–99)

## 2013-02-12 LAB — DIGOXIN LEVEL: Digoxin: 2.6 ng/mL

## 2013-02-13 LAB — DIGOXIN LEVEL: Digoxin: 2.01 ng/mL

## 2013-02-14 DIAGNOSIS — I509 Heart failure, unspecified: Secondary | ICD-10-CM

## 2013-02-14 DIAGNOSIS — I2510 Atherosclerotic heart disease of native coronary artery without angina pectoris: Secondary | ICD-10-CM

## 2013-02-14 LAB — BASIC METABOLIC PANEL
BUN: 26 mg/dL — ABNORMAL HIGH (ref 7–18)
Calcium, Total: 8.2 mg/dL — ABNORMAL LOW (ref 8.5–10.1)
Chloride: 105 mmol/L (ref 98–107)
Creatinine: 1.83 mg/dL — ABNORMAL HIGH (ref 0.60–1.30)
EGFR (African American): 28 — ABNORMAL LOW
EGFR (Non-African Amer.): 24 — ABNORMAL LOW
Osmolality: 281 (ref 275–301)
Sodium: 138 mmol/L (ref 136–145)

## 2013-02-14 LAB — CBC WITH DIFFERENTIAL/PLATELET
Eosinophil %: 1.2 %
HCT: 28.6 % — ABNORMAL LOW (ref 35.0–47.0)
Lymphocyte %: 11.6 %
MCH: 32.3 pg (ref 26.0–34.0)
Monocyte %: 10.6 %
Neutrophil #: 7.7 10*3/uL — ABNORMAL HIGH (ref 1.4–6.5)
Neutrophil %: 76.2 %
Platelet: 296 10*3/uL (ref 150–440)
WBC: 10.1 10*3/uL (ref 3.6–11.0)

## 2013-02-15 ENCOUNTER — Telehealth: Payer: Self-pay

## 2013-02-15 ENCOUNTER — Other Ambulatory Visit: Payer: Self-pay | Admitting: *Deleted

## 2013-02-15 MED ORDER — HYDROCODONE-ACETAMINOPHEN 5-325 MG PO TABS: 1.0000 | ORAL_TABLET | ORAL | Status: DC | PRN

## 2013-02-15 NOTE — Telephone Encounter (Signed)
Patient contacted regarding discharge from [facility] on 02/14/13.  Spoke w/ pt's son, who states that pt is in rehab, "she ain't coming home". States that she is "somewhere in [place]" and he "don't know where that place is or the phone # over there".

## 2013-02-21 ENCOUNTER — Non-Acute Institutional Stay (SKILLED_NURSING_FACILITY): Payer: Medicare Other | Admitting: Nurse Practitioner

## 2013-02-21 ENCOUNTER — Ambulatory Visit: Payer: Medicare Other | Admitting: Family Medicine

## 2013-02-21 DIAGNOSIS — I509 Heart failure, unspecified: Secondary | ICD-10-CM

## 2013-02-21 DIAGNOSIS — I5032 Chronic diastolic (congestive) heart failure: Secondary | ICD-10-CM

## 2013-02-21 DIAGNOSIS — R0989 Other specified symptoms and signs involving the circulatory and respiratory systems: Secondary | ICD-10-CM

## 2013-02-21 DIAGNOSIS — R030 Elevated blood-pressure reading, without diagnosis of hypertension: Secondary | ICD-10-CM

## 2013-02-21 DIAGNOSIS — R609 Edema, unspecified: Secondary | ICD-10-CM

## 2013-02-26 ENCOUNTER — Non-Acute Institutional Stay (SKILLED_NURSING_FACILITY): Payer: Medicare Other | Admitting: Nurse Practitioner

## 2013-02-26 DIAGNOSIS — N189 Chronic kidney disease, unspecified: Secondary | ICD-10-CM

## 2013-02-26 DIAGNOSIS — I5032 Chronic diastolic (congestive) heart failure: Secondary | ICD-10-CM

## 2013-02-26 DIAGNOSIS — R609 Edema, unspecified: Secondary | ICD-10-CM

## 2013-02-26 DIAGNOSIS — T07XXXA Unspecified multiple injuries, initial encounter: Secondary | ICD-10-CM

## 2013-02-26 DIAGNOSIS — R030 Elevated blood-pressure reading, without diagnosis of hypertension: Secondary | ICD-10-CM

## 2013-02-27 ENCOUNTER — Encounter: Payer: Self-pay | Admitting: *Deleted

## 2013-02-27 ENCOUNTER — Encounter: Payer: Medicare Other | Admitting: Cardiovascular Disease

## 2013-05-07 ENCOUNTER — Non-Acute Institutional Stay (SKILLED_NURSING_FACILITY): Payer: Medicare Other | Admitting: Adult Health

## 2013-05-07 DIAGNOSIS — I4891 Unspecified atrial fibrillation: Secondary | ICD-10-CM

## 2013-05-07 DIAGNOSIS — I10 Essential (primary) hypertension: Secondary | ICD-10-CM

## 2013-05-07 DIAGNOSIS — I2510 Atherosclerotic heart disease of native coronary artery without angina pectoris: Secondary | ICD-10-CM

## 2013-05-07 DIAGNOSIS — J4489 Other specified chronic obstructive pulmonary disease: Secondary | ICD-10-CM

## 2013-05-07 DIAGNOSIS — E1159 Type 2 diabetes mellitus with other circulatory complications: Secondary | ICD-10-CM

## 2013-05-07 DIAGNOSIS — E039 Hypothyroidism, unspecified: Secondary | ICD-10-CM

## 2013-05-07 DIAGNOSIS — D649 Anemia, unspecified: Secondary | ICD-10-CM

## 2013-05-07 DIAGNOSIS — I5032 Chronic diastolic (congestive) heart failure: Secondary | ICD-10-CM

## 2013-05-07 DIAGNOSIS — E785 Hyperlipidemia, unspecified: Secondary | ICD-10-CM

## 2013-05-07 DIAGNOSIS — K219 Gastro-esophageal reflux disease without esophagitis: Secondary | ICD-10-CM

## 2013-05-07 DIAGNOSIS — G4700 Insomnia, unspecified: Secondary | ICD-10-CM

## 2013-05-07 DIAGNOSIS — J449 Chronic obstructive pulmonary disease, unspecified: Secondary | ICD-10-CM

## 2013-05-13 ENCOUNTER — Encounter: Payer: Self-pay | Admitting: Adult Health

## 2013-05-13 DIAGNOSIS — J449 Chronic obstructive pulmonary disease, unspecified: Secondary | ICD-10-CM | POA: Insufficient documentation

## 2013-05-13 NOTE — Progress Notes (Signed)
Patient ID: [name], female   DOB: Sep 21, 1923, 79 y.o.   MRN: [id number]     [facility]  Allergies  Allergen Reactions  . Naproxen     REACTION: gastritis     Chief Complaint  Patient presents with  . Medical Managment of Chronic Issues    HPI:  She is being seen for the management of her chronic illnesses. There are no concerns being voiced by the nursing staff at this time. She is unable to fully participate in the hpi or ros.  Per the nursing staff there has been no significant in her overall status.     Past Medical History  Diagnosis Date  . Coronary artery disease     a. Acute MI 2003, stent to LCx. b. NSTEMI 2008 (cath w/o PCI).  c. +trop 02/2011 felt 2/2 demand ischemia.  [name] History of atrial flutter 2009    a. s/p ablation of typical flutter 09/2007.  [name] Paroxysmal atrial fibrillation     a. Dx 2009. b. Discontinuation of Coumadin 01/2008 2/2 hemarthrosis/chronic debilitation.  [name] COPD (chronic obstructive pulmonary disease)   . Hyperlipidemia   . Diabetes mellitus   . Diastolic congestive heart failure   . Arthritis   . Chronic kidney disease   . Carotid artery disease     a. Bilateral  . SSS (sick sinus syndrome)     a. Symptomatic bradycardia/syncope and post-termination pauses - St. Jude dual chamber PPM 09/2007.   [name] Pneumonia   . Anxiety     adjustment disorder  . Pacemaker   . Mesenteric ischemia     a. Possible dx, 2013.  . Diverticula of colon     a. Colonoscopy 01/2012.  [name] PVD (peripheral vascular disease)     Carotid dz as noted, repoted aortoiliac occlusive dz, also reported high-grade stenosis at the origin & prox innominate artery, high-grade stenosis of the right carotid bifurcation, moderate stenosis of the proximal prevertebral left subclavian artery by CT 02/2011  . Pituitary tumor     s/p gamma knife    Past Surgical History  Procedure Laterality Date  . Cardiac catheterization      2003 cardiac stent  . Insert / replace /  remove pacemaker  2009    St. Jude  . Cholecystectomy    . Total knee arthroplasty      bilateral  . Bilateral hip arthroscopy    . 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 Myocardial perfusion study      EF 59% 4/04  . Adenosine myoview study      abn EF 53% 12/04/04  . Carotid doppler  2/99  . Echo with lvh  1/98    old records  . Stress cardiolite  3/04    EF 53%  . Gamma knife radiosurg  08/25/06    [facility]  . Pvd      with stent in the right iliac   . Colonoscopy  01/27/2012    Procedure: COLONOSCOPY;  Surgeon: Louis Meckel, MD;  Location: [facility] ENDOSCOPY;  Service: Endoscopy;  Laterality: N/A;    VITAL SIGNS BP 128/56  Pulse 60  Ht 5\' 5"  (1.651 m)  Wt 137 lb 3.2 oz (62.234 kg)  BMI 22.83 kg/m2   Patient's Medications  New Prescriptions   No medications on file  Previous Medications   ACLIDINIUM BROMIDE (TUDORZA PRESSAIR) 400 MCG/ACT AEPB    Inhale 1 puff into the lungs 2 (  two) times daily.   AMIODARONE (PACERONE) 200 MG TABLET    Take 1 tablet (200 mg total) by mouth 2 (two) times daily.   AMLODIPINE (NORVASC) 10 MG TABLET    Take 10 mg by mouth daily.   ASPIRIN 81 MG TABLET    Take 81 mg by mouth daily.   ATORVASTATIN (LIPITOR) 40 MG TABLET    Take 1 tablet (40 mg total) by mouth daily.   CLONIDINE (CATAPRES - DOSED IN MG/24 HR) 0.1 MG/24HR PATCH    Place 0.1 mg onto the skin once a week.   CLOPIDOGREL (PLAVIX) 75 MG TABLET    Take 75 mg by mouth daily with breakfast.   DIGOXIN (LANOXIN) 0.125 MG TABLET    Take 1 tablet (0.125 mg total) by mouth every other day.   DOCUSATE SODIUM (COLACE) 100 MG CAPSULE    Take 100 mg by mouth daily.   FERROUS SULFATE 325 (65 FE) MG TABLET    Take 325 mg by mouth 2 (two) times daily with a meal.   HYDROCODONE-ACETAMINOPHEN (NORCO/VICODIN) 5-325 MG PER TABLET    Take 1 tablet by mouth every 4 (four) hours as needed for pain.   INSULIN ASPART (NOVOLOG FLEXPEN) 100 UNIT/ML SOPN FLEXPEN    Inject  0-9 Units into the skin 3 (three) times daily with meals. ssi   LEVOTHYROXINE (SYNTHROID, LEVOTHROID) 50 MCG TABLET    Take 50 mcg by mouth daily before breakfast.   NITROGLYCERIN (NITROSTAT) 0.4 MG SL TABLET    Place 0.4 mg under the tongue every 5 (five) minutes as needed. For chest pain   POLYETHYLENE GLYCOL (MIRALAX / GLYCOLAX) PACKET    Take 17 g by mouth 2 (two) times daily.   RANITIDINE (ZANTAC) 150 MG CAPSULE    Take 150 mg by mouth every morning.   SENNA (SENOKOT) 8.6 MG TABS TABLET    Take 1 tablet by mouth daily as needed for mild constipation.   TORSEMIDE (DEMADEX) 20 MG TABLET    Take 20 mg by mouth daily.   TRAZODONE (DESYREL) 100 MG TABLET    Take 100 mg by mouth at bedtime.  Modified Medications   Modified Medication Previous Medication   CARVEDILOL (COREG) 25 MG TABLET carvedilol (COREG) 25 MG tablet      Take 6.25 mg by mouth 2 (two) times daily with a meal.    Take 1 tablet (25 mg total) by mouth 2 (two) times daily with a meal.   CLONIDINE (CATAPRES) 0.2 MG TABLET cloNIDine (CATAPRES) 0.2 MG tablet      Take 0.1 mg by mouth 3 (three) times daily. AS NEEDED FOR S B/P >160    Take 1 tablet (0.2 mg total) by mouth 2 (two) times daily.  Discontinued Medications   ACETAMINOPHEN (TYLENOL EX ST ARTHRITIS PAIN) 500 MG TABLET    Take 1-2 tablets (500-1,000 mg total) by mouth every 6 (six) hours as needed for pain.   ALBUTEROL (PROVENTIL HFA;VENTOLIN HFA) 108 (90 BASE) MCG/ACT INHALER    Inhale 2 puffs into the lungs every 6 (six) hours as needed for wheezing.   ALBUTEROL (PROVENTIL HFA;VENTOLIN HFA) 108 (90 BASE) MCG/ACT INHALER    Inhale 2 puffs into the lungs every 6 (six) hours as needed for wheezing.   APIXABAN (ELIQUIS) 2.5 MG TABS TABLET    Take 1 tablet (2.5 mg total) by mouth 2 (two) times daily.   FUROSEMIDE (LASIX) 40 MG TABLET    Take one tablet daily alternate with one tablet  twice a day.   HYDRALAZINE (APRESOLINE) 50 MG TABLET    Take 1 tablet (50 mg total) by mouth 3  (three) times daily.   LEVOFLOXACIN (LEVAQUIN) 750 MG TABLET    Take 750 mg by mouth daily.   MIRTAZAPINE (REMERON) 15 MG TABLET    Take 1 tablet (15 mg total) by mouth at bedtime.   TIOTROPIUM (SPIRIVA) 18 MCG INHALATION CAPSULE    Place 1 capsule (18 mcg total) into inhaler and inhale daily.    SIGNIFICANT DIAGNOSTIC EXAMS  02-21-13: chest x-ray: minimal cardiomegaly without pulmonary vascular congestion. No pleural effusion. Old granulomatous disease; patchy interstitial pneumonitis at lung bases.   03-02-13: chest x-ray: no evidence of acute disease: has pace maker   LABS REVIEWED:   02-15-13: dig 1.4  02-19-13: tsh 6.651pre-albumin 20.54; albumin 2.8; vit b12: 210  03-05-13: wbc 8.6; hgb 8.9; hct 28.3; mcv 99.6; plt 364; glucose 96; bun 46; creat 2.0; k+4.6; na++143; vit b12: 1840 02-27-13: glucose 139; bun 36; creat 1.8; k+4.6; na++ 138 03-08-13: urine culture: e-coli: vantin 03-15-13: wbc 9.8; hgb 10.7; hct 3.8; mcv 98.8; plt 358  03-26-13: wbc 6.5; hgb 9.4; hct 29.6; mcv 96.1;lt 302;  04-10-13: tsh 3.012    Review of Systems  Unable to perform ROS  Physical Exam  Constitutional: No distress.  Thin   Neck: Neck supple. No JVD present.  Cardiovascular: Normal rate, regular rhythm and intact distal pulses.   Respiratory: Effort normal and breath sounds normal. No respiratory distress.  GI: Soft. Bowel sounds are normal. She exhibits no distension. There is no tenderness.  Musculoskeletal: She exhibits no edema.  Is able to move extremities   Neurological: She is alert.  Skin: Skin is warm and dry. She is not diaphoretic.  Left heel unstaged: 2.8 x 2.4cm  Right heel 2.4 x 3.2 cm No signs of infection present       ASSESSMENT/ PLAN:  1. Hypertension; is stable will continue norvasc 10 mg daily;  clonidine 0.1 mg weekly patch; and tid prn for elevated systolic patch; coreg 6.25 mg twice daily   2. Afib: is stable will continue asa 81 mg daily; plavix 75 mg daily   amiodarone 200 mg twice daily for rate control; digoxin 0.125 mg every other day; and will monitor  3. Chronic diastolic heart failure: is stable will continue demadex 20 mg daily   4. Diabetes: will change the novolog ssi t 3 units prior to meals for cbg >=150 and will monitor   5. Hypothyroidism: will continue synthroid 25 mcg daily  6. Cad: without chest pain present; will continue asa 81 mg daily plavix 75 mg daily and prn ntg as needed  7. Dyslipidemia: will continue lipitor 40 mg daily  8. Gerd: will continue zantac 150 mg daily  9. Copd:is stable will continue tudorza 1 puff twice daily and will monitor her status   10. Anemia: will continue iron twice daily  11. Constipation: will continue miralax twice daily senna daily and colace twice daily   12. Insomnia: will continue trazodone 100 mg nightly    Will check hgb a1c and lipids

## 2013-05-16 NOTE — Progress Notes (Signed)
[name], [place] [address]  Patient ID: [name], female   DOB: Jul 24, 1923, 79 y.o.   MRN: [id number]  Allergies  Allergen Reactions  . Naproxen     REACTION: gastritis   Chief Complaint  Patient presents with  . Acute Visit   HPI:  Alerted by Nursing staff today r/t pt having systolic blood pressures in the 190's.  Pt with a long history of CHF, CAD, Atrial fibrillation, PAD, carotid artery disease.  Pt has been asymptomatic, no c/o headache, or chest pain. Also report of chest congestion  Review of Systems  Constitutional:       Poor appetite  HENT: Negative for ear discharge and ear pain.   Eyes: Negative for discharge and redness.  Respiratory: Negative for hemoptysis.   Cardiovascular: Positive for leg swelling. Negative for chest pain.  Gastrointestinal: Negative for blood in stool.  Genitourinary: Negative for hematuria and flank pain.  Neurological: Positive for weakness. Negative for seizures, loss of consciousness and headaches.   Past Medical History  Diagnosis Date  . Coronary artery disease     a. Acute MI 2003, stent to LCx. b. NSTEMI 2008 (cath w/o PCI).  c. +trop 02/2011 felt 2/2 demand ischemia.  [name] History of atrial flutter 2009    a. s/p ablation of typical flutter 09/2007.  [name] Paroxysmal atrial fibrillation     a. Dx 2009. b. Discontinuation of Coumadin 01/2008 2/2 hemarthrosis/chronic debilitation.  [name] COPD (chronic obstructive pulmonary disease)   . Hyperlipidemia   . Diabetes mellitus   . Diastolic congestive heart failure   . Arthritis   . Chronic kidney disease   . Carotid artery disease     a. Bilateral  . SSS (sick sinus syndrome)     a. Symptomatic bradycardia/syncope and post-termination pauses - St. Jude dual chamber PPM 09/2007.   [name] Pneumonia   . Anxiety     adjustment disorder  . Pacemaker   . Mesenteric ischemia     a. Possible dx, 2013.  . Diverticula of colon     a. Colonoscopy 01/2012.  [name] PVD (peripheral vascular disease)    Carotid dz as noted, repoted aortoiliac occlusive dz, also reported high-grade stenosis at the origin & prox innominate artery, high-grade stenosis of the right carotid bifurcation, moderate stenosis of the proximal prevertebral left subclavian artery by CT 02/2011  . Pituitary tumor     s/p gamma knife   Past Surgical History  Procedure Laterality Date  . Cardiac catheterization      2003 cardiac stent  . Insert / replace / remove pacemaker  2009    St. Jude  . Cholecystectomy    . Total knee arthroplasty      bilateral  . Bilateral hip arthroscopy    . 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 Myocardial perfusion study      EF 59% 4/04  . Adenosine myoview study      abn EF 53% 12/04/04  . Carotid doppler  2/99  . Echo with lvh  1/98    old records  . Stress cardiolite  3/04    EF 53%  . Gamma knife radiosurg  08/25/06    [facility]  . Pvd      with stent in the right iliac   . Colonoscopy  01/27/2012    Procedure: COLONOSCOPY;  Surgeon: Louis Meckel, MD;  Location: [facility] ENDOSCOPY;  Service: Endoscopy;  Laterality: N/A;  Medication  Sig  Dispense  Refill   .  acetaminophen (TYLENOL EX ST ARTHRITIS PAIN) 500 MG tablet  Take 1-2 tablets (500-1,000 mg total) by mouth every 6 (six) hours as needed for pain.         [name]  albuterol (PROVENTIL HFA;VENTOLIN HFA) 108 (90 BASE) MCG/ACT inhaler  Inhale 2 puffs into the lungs every 6 (six) hours as needed for wheezing.         [name]  amiodarone (PACERONE) 200 MG tablet  Take 1 tablet (200 mg total) by mouth 2 (two) times daily.      [name]  aspirin 81 MG tablet  Take 81 mg by mouth daily.         [name]  atorvastatin (LIPITOR) 40 MG tablet  Take 1 tablet (40 mg total) by mouth daily.      .  carvedilol (COREG) 25 MG tablet  Take 1 tablet (25 mg total) by mouth 2 (two) times daily with a meal.        .  cloNIDine (CATAPRESS 2 MG tablet  Take 0.1 MG FOR SBP > 160, TWICE A DAY       .  digoxin (LANOXIN) 0.125 MG tablet   Take 1 tablet (0.125 mg total) by mouth every other day.       .  docusate sodium (COLACE) 100 MG capsule  Take 100 mg by mouth daily.         .  feeding supplement (ENSURE COMPLETE) LIQD  Take 237 mLs by mouth 3 (three) times daily between meals.         .             .  hydrALAZINE (APRESOLINE) 50 MG tablet  Take 1 tablet (50 mg total) by mouth 3 (three) times daily.     [name]  levalbuterol (XOPENEX HFA) 45 MCG/ACT inhaler  Inhale 2 puffs into the lungs as needed.         .  metFORMIN (GLUMETZA) 500 MG (MOD) 24 hr tablet  Take 1 tablet (500 mg total) by mouth 2 (two) times daily with a meal.       .  nitroGLYCERIN (NITROSTAT) 0.4 MG SL tablet  Place 0.4 mg under the tongue every 5 (five) minutes as needed. For chest pain         .  tiotropium (SPIRIVA) 18 MCG inhalation capsule  Place 18 mcg into inhaler and inhale daily.         .                Labs from 02/19/2013  Sodium 140 Potassium 5.0 Chloride 108 CO2 24 AGAP 8 Glucose 112 BUN 30 Creatinine 1.5 Calcium 8.2 Total protein 5.8 Albumin 2.8 Globulin 3.0 Total bilirubin 0.3 ALP 80 AST 22 ALT 21   vitamin B12 230 TSH 6.510 Folate 980  Noteworthy on 02/15/2013 patient's creatinine was 1.8 with a BUN of 28 Hemoglobin was 8.9 hematocrit 28.0  BP 195/74  Pulse 59  Temp(Src) 97.1 F (36.2 C)  Resp 18  Physical Exam  In general, the patient appears very fatigued, but she is alert, oriented and very verbally appropriate.  Auscultation of the chest shows rhonchi throughout, respirations are easy and unlabored.  Bilateral lower extremities with 2-3+ pitting edema, 1-2+ pitting edema extending up the posterior thighs.  Assessment/plan:  Chest congestion, possibility of pneumonia versus congestive heart failure, will order a chest x-ray to be done as soon as possible  Likely congestive  heart failure/edema/question of renal insufficiency, will give 40 mg of Lasix now. Would anticipate improvement of blood pressure after  Lasix. We will certainly continue to monitor very closely.  Elevated systolic blood pressure, I spoke with the nursing staff last night and clonidine 0.1 mg was given to lower the patient's systolic blood pressure. We will continue his protocol. Likely the patient will need additional blood pressure medication, but would have to be very careful to avoid an agent that would lower her heart rate further. Again, we will continue to monitor very closely.  Appropriate labs to be drawn.  We'll reassess the patient tomorrow.

## 2013-05-18 NOTE — Progress Notes (Signed)
02/26/2013 R[facility] [place]  Patient ID: [name], female   DOB: June 21, 1923, 80 y.o.   MRN: [id number]  Allergies  Allergen Reactions  . Naproxen     REACTION: gastritis   Chief Complaint  Patient presents with  . Acute Visit   HPI:  Responding today to a BNP of 2370.  The pt has a history of chronic diastolic congestive heart failure.  Ongoing issue with edema extending up the posterior thighs, also worsened by pt sitting for long periods of time with her legs in a dependent position.   Her blood pressures have been elevated with systolic values in the [id number].  Also some renal dysfunction with creatine values between 1.4 to 1.8, with elevated BUN.  Over the past week has been a delicate balance with careful use of lasix.  Too aggressive use of lasix will worsen her renal function.  Resolving her edema is not possible.  Pt's friends bring her high sodium foods such as hot dogs, and she continues to use smokeless tobacco.    Noteworthy, over the weekend of the 11th, the pt sustained a fall, hitting her face. She fell trying to move from wheelchair to bed.  Review of Systems  Constitutional: Positive for malaise/fatigue.  HENT: Negative for ear discharge and nosebleeds.   Eyes: Negative for pain, discharge and redness.  Respiratory: Negative for hemoptysis and wheezing.   Cardiovascular: Positive for leg swelling.  Gastrointestinal: Negative for vomiting and blood in stool.  Genitourinary: Negative for hematuria and flank pain.  Musculoskeletal: Positive for falls.       Fall sustained over the weekend of October 11  Skin:       Facial contusions  Neurological: Positive for weakness. Negative for seizures and loss of consciousness.  Psychiatric/Behavioral: Negative for suicidal ideas and hallucinations.    Past Medical History  Diagnosis Date  . Coronary artery disease     a. Acute MI 2003, stent to LCx. b. NSTEMI 2008 (cath w/o PCI).  c. +trop 02/2011 felt 2/2  demand ischemia.  [name] History of atrial flutter 2009    a. s/p ablation of typical flutter 09/2007.  [name] Paroxysmal atrial fibrillation     a. Dx 2009. b. Discontinuation of Coumadin 01/2008 2/2 hemarthrosis/chronic debilitation.  [name] COPD (chronic obstructive pulmonary disease)   . Hyperlipidemia   . Diabetes mellitus   . Diastolic congestive heart failure   . Arthritis   . Chronic kidney disease   . Carotid artery disease     a. Bilateral  . SSS (sick sinus syndrome)     a. Symptomatic bradycardia/syncope and post-termination pauses - St. Jude dual chamber PPM 09/2007.   [name] Pneumonia   . Anxiety     adjustment disorder  . Pacemaker   . Mesenteric ischemia     a. Possible dx, 2013.  . Diverticula of colon     a. Colonoscopy 01/2012.  [name] PVD (peripheral vascular disease)     Carotid dz as noted, repoted aortoiliac occlusive dz, also reported high-grade stenosis at the origin & prox innominate artery, high-grade stenosis of the right carotid bifurcation, moderate stenosis of the proximal prevertebral left subclavian artery by CT 02/2011  . Pituitary tumor     s/p gamma knife   Past Surgical History  Procedure Laterality Date  . Cardiac catheterization      2003 cardiac stent  . Insert / replace / remove pacemaker  2009    St. Jude  . Cholecystectomy    .  Total knee arthroplasty      bilateral  . Bilateral hip arthroscopy    . 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 Myocardial perfusion study      EF 59% 4/04  . Adenosine myoview study      abn EF 53% 12/04/04  . Carotid doppler  2/99  . Echo with lvh  1/98    old records  . Stress cardiolite  3/04    EF 53%  . Gamma knife radiosurg  08/25/06    [facility]  . Pvd      with stent in the right iliac   . Colonoscopy  01/27/2012    Procedure: COLONOSCOPY;  Surgeon: Inda Castle, MD;  Location: [place];  Service: Endoscopy;  Laterality: N/A;   Current Outpatient Prescriptions on File  Prior to Visit  Medication Sig Dispense Refill  . amiodarone (PACERONE) 200 MG tablet Take 1 tablet (200 mg total) by mouth 2 (two) times daily.  60 tablet  3  . aspirin 81 MG tablet Take 81 mg by mouth daily.      [name] atorvastatin (LIPITOR) 40 MG tablet Take 1 tablet (40 mg total) by mouth daily.  90 tablet  3  . digoxin (LANOXIN) 0.125 MG tablet Take 1 tablet (0.125 mg total) by mouth every other day.  90 tablet  3  . docusate sodium (COLACE) 100 MG capsule Take 100 mg by mouth daily.      [name] HYDROcodone-acetaminophen (NORCO/VICODIN) 5-325 MG per tablet Take 1 tablet by mouth every 4 (four) hours as needed for pain.  30 tablet  0  . insulin aspart (NOVOLOG FLEXPEN) 100 UNIT/ML SOPN FlexPen Inject 0-9 Units into the skin 3 (three) times daily with meals. ssi      . nitroGLYCERIN (NITROSTAT) 0.4 MG SL tablet Place 0.4 mg under the tongue every 5 (five) minutes as needed. For chest pain      . ranitidine (ZANTAC) 150 MG capsule Take 150 mg by mouth every morning.       No current facility-administered medications on file prior to visit.  Norvasc 5 mg one each day On 02/21/2013 received 40 mg of Lasix, does not receive routine lasix     Labs from 02/19/2013  Sodium 140 Potassium 5.0 Chloride 108 CO2 24 AGAP 8 Glucose 112 BUN 30 Creatinine 1.5 Calcium 8.2 Total protein 5.8 Albumin 2.8 Globulin 3.0 Total bilirubin 0.3 ALP 80 AST 22 ALT 21   vitamin B12 230 TSH 6.510 Folate 980  Noteworthy on 02/15/2013 patient's creatinine was 1.8 with a BUN of 28 Hemoglobin was 8.9 hematocrit of 28  02/22/2013 Na 139 K 5.3 CL 108 CO2 22 AGAP 9 Gluc 84 BUN 33 Cr 1.9 Ca 8  02/26/2013 Cl 109 CO2 25 AGAP 6 Gluc 78 BUN 36 Cr 1.6 Ca 7.8  WBC 7.3 RBC 2.7 HGB 8.4 HCT 27.1 MCV 98.9 MCH 30.7 MCHC 31.0 RDW 16.6 PLT 399  BNP 2370  Pulse oximetry 96% pulse 62 RR 20, BP 186/74  General, fatigued appearing AA female in no acute distress.  States she feels in her usual state  of health.  Container of snuff at bedside. Verbally appropriate, oriented x 3 Face with multiple ecchymoses and swelling, related to a fall sustained on the weekend of 02/24/2013. Pupils are equally round reactive to light, extraocular movements are intact TMs are unremarkable Oropharynx no apparent gross oral lesions No thyromegaly, no cervical adenopathy Apical pulse positive for  murmur otherwise unremarkable. Bilateral breath sounds are surprisingly clear, and respirations are unlabored. Abdomen is soft and nondistended nontender to palpation Bilateral lower extremities with ongoing 3+ pitting edema which extends up the patient's posterior thighs, at 1-2+ pitting edema. This is not a new finding.  Subsequently, pedal pulses are difficult to appreciate. There is no skin compromise   Assessment/plan:  Congestive heart failure/edema, suspect that this is a chronic problem rather than a true exacerbation. I will do a gentle 20 mg dose of Lasix tonight and repeat the BMP tomorrow morning. We will need to carefully monitor her renal function. Will not be able to resolve the edema but bring it to a more manageable level.   Have spoken with Dr. Nyoka Cowden, and he has advised that it would be a better balance to allow the patient to maintain edema rather potentially compromising her renal function.  Recently added Norvasc to the patient's regimen, I do not see a way to eliminate this medication and may actually increase it to 10 mg to bring the patient's blood pressure to a more acceptable level. Her heart rate is typically low in the 50's-60's, and I am reluctant to add any medication that could further lower her heart rate.  Will certainly continue the amiodarone 200 mg twice a day,  digoxin 0.125 mg one tablet every other day, Coreg 25 mg as prescribed.   ACE inhibitors are contraindicated r/t her impaired renal function.  ARB's can only be used with extreme caution in impaired renal function, but  are not indicated for this patient as she typically has an elevated BUN.  Coronary artery disease, we'll certainly continue the aspirin 81 mg a day, and the Lipitor 40 mg each evening.  We'll do iron replacement to address her anemia.  Hypertension, elevated systolic blood pressures, will continue the clonidine 0.1 administered as prescribed.  Tobacco abuse, have spoken with the patient's granddaughter regarding her status as well as the issues of keeping tobacco at bedside. Have asked the patient please stop using her snuff. Discussed with the granddaughter about folks bringing in high salt food, probably not possible to stop that.  Contusions, facial, will anticipate those resolving over time. Patient has not had any neurological compromise.  Will maintain detailed and close followup for this very complicated patient  Have discussed the situation with the patient and she wishes to know regarding her health status. Have also discussed the importance of lower extremity elevation while sitting. The patient spends a considerable amount of time in her wheelchair with her legs in a dependent position.

## 2013-05-24 IMAGING — CR DG CHEST 2V
2 series · 2 of 2 positions shown · non-contrast
Comparison: Portable chest x-ray 01/23/2012, 09/21/2010,
03/01/2010.  Two-view chest x-ray 12/04/2009.

CLINICAL DATA: Cough.  Rhonchi on physical examination.

CHEST - 2 VIEW

[w chest lat]
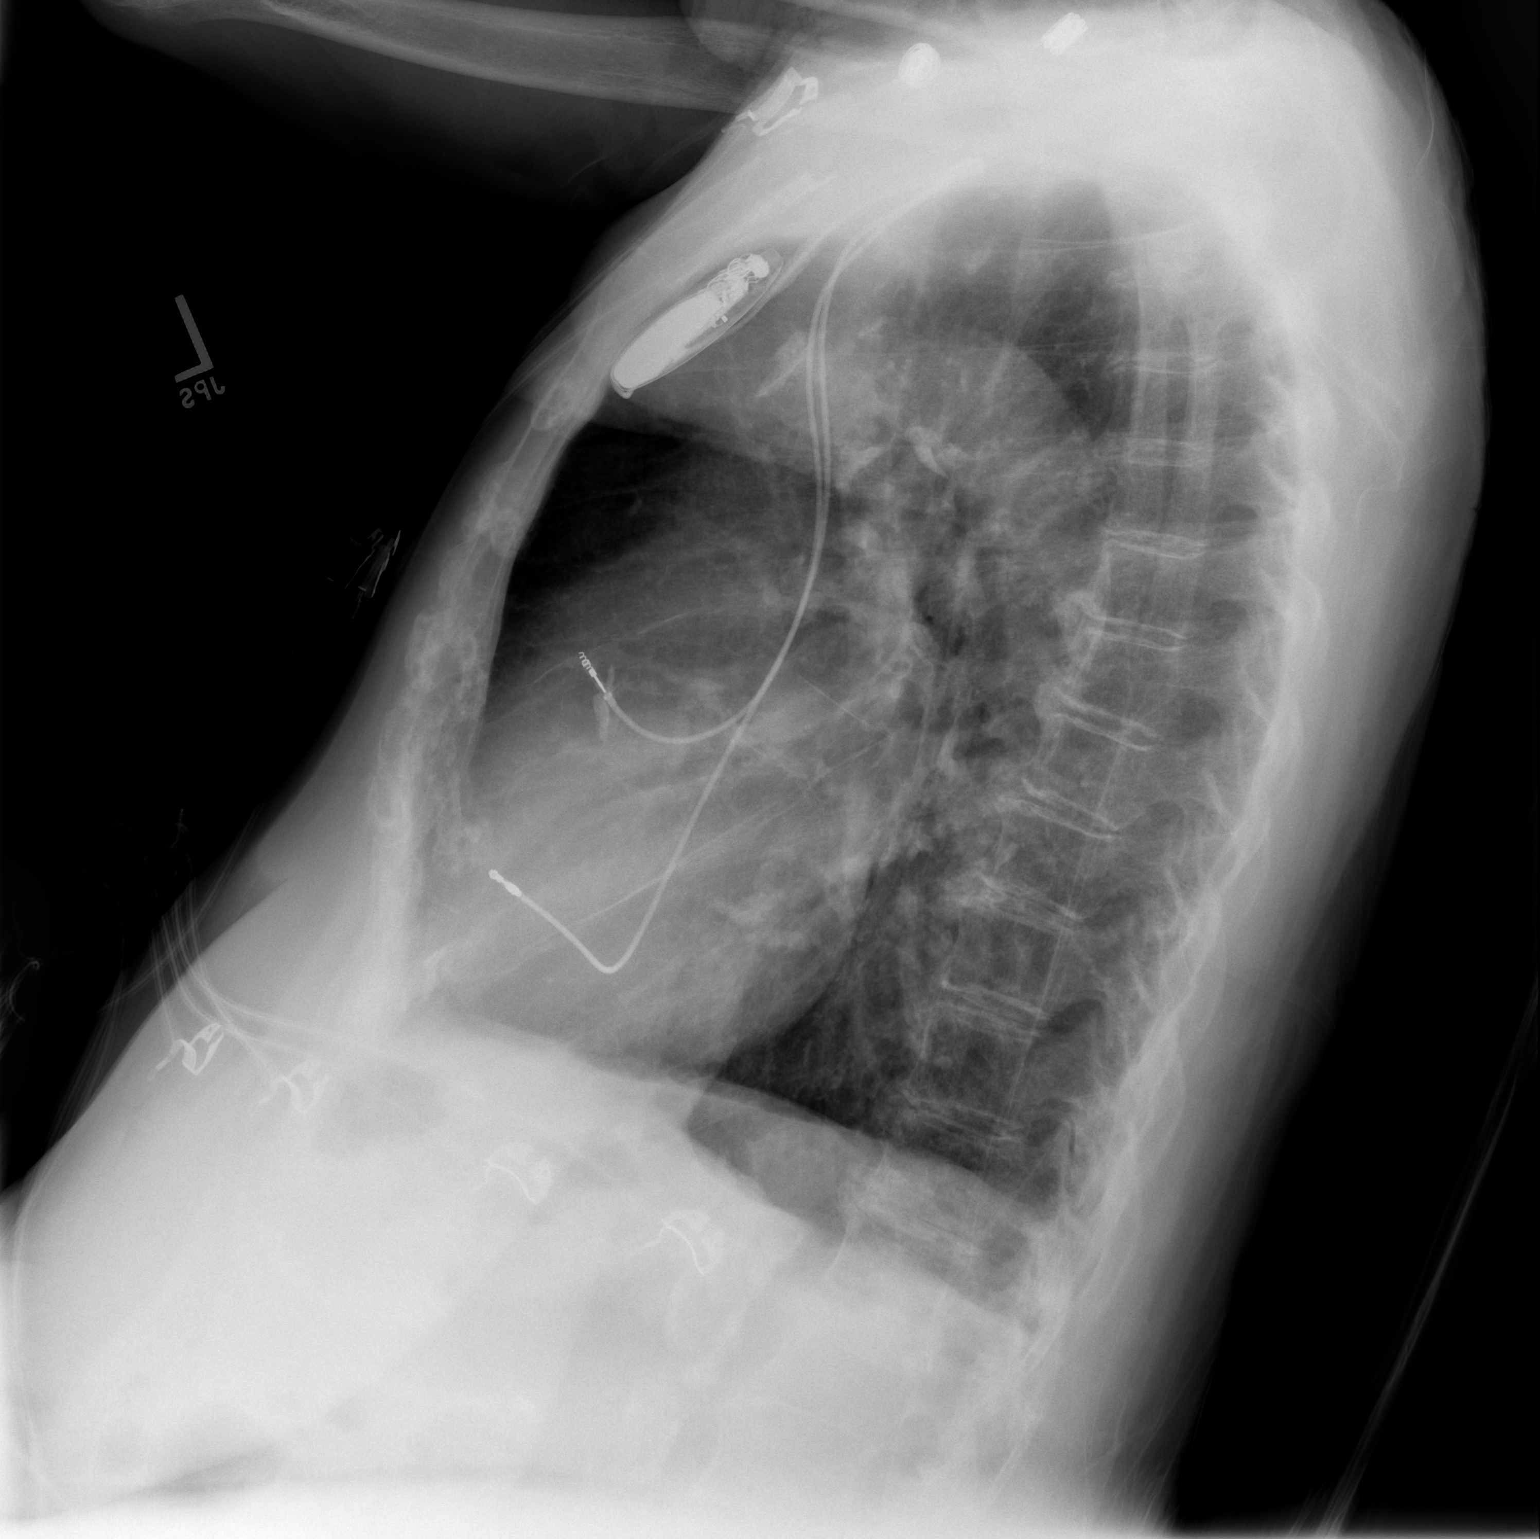

[w chest pa]
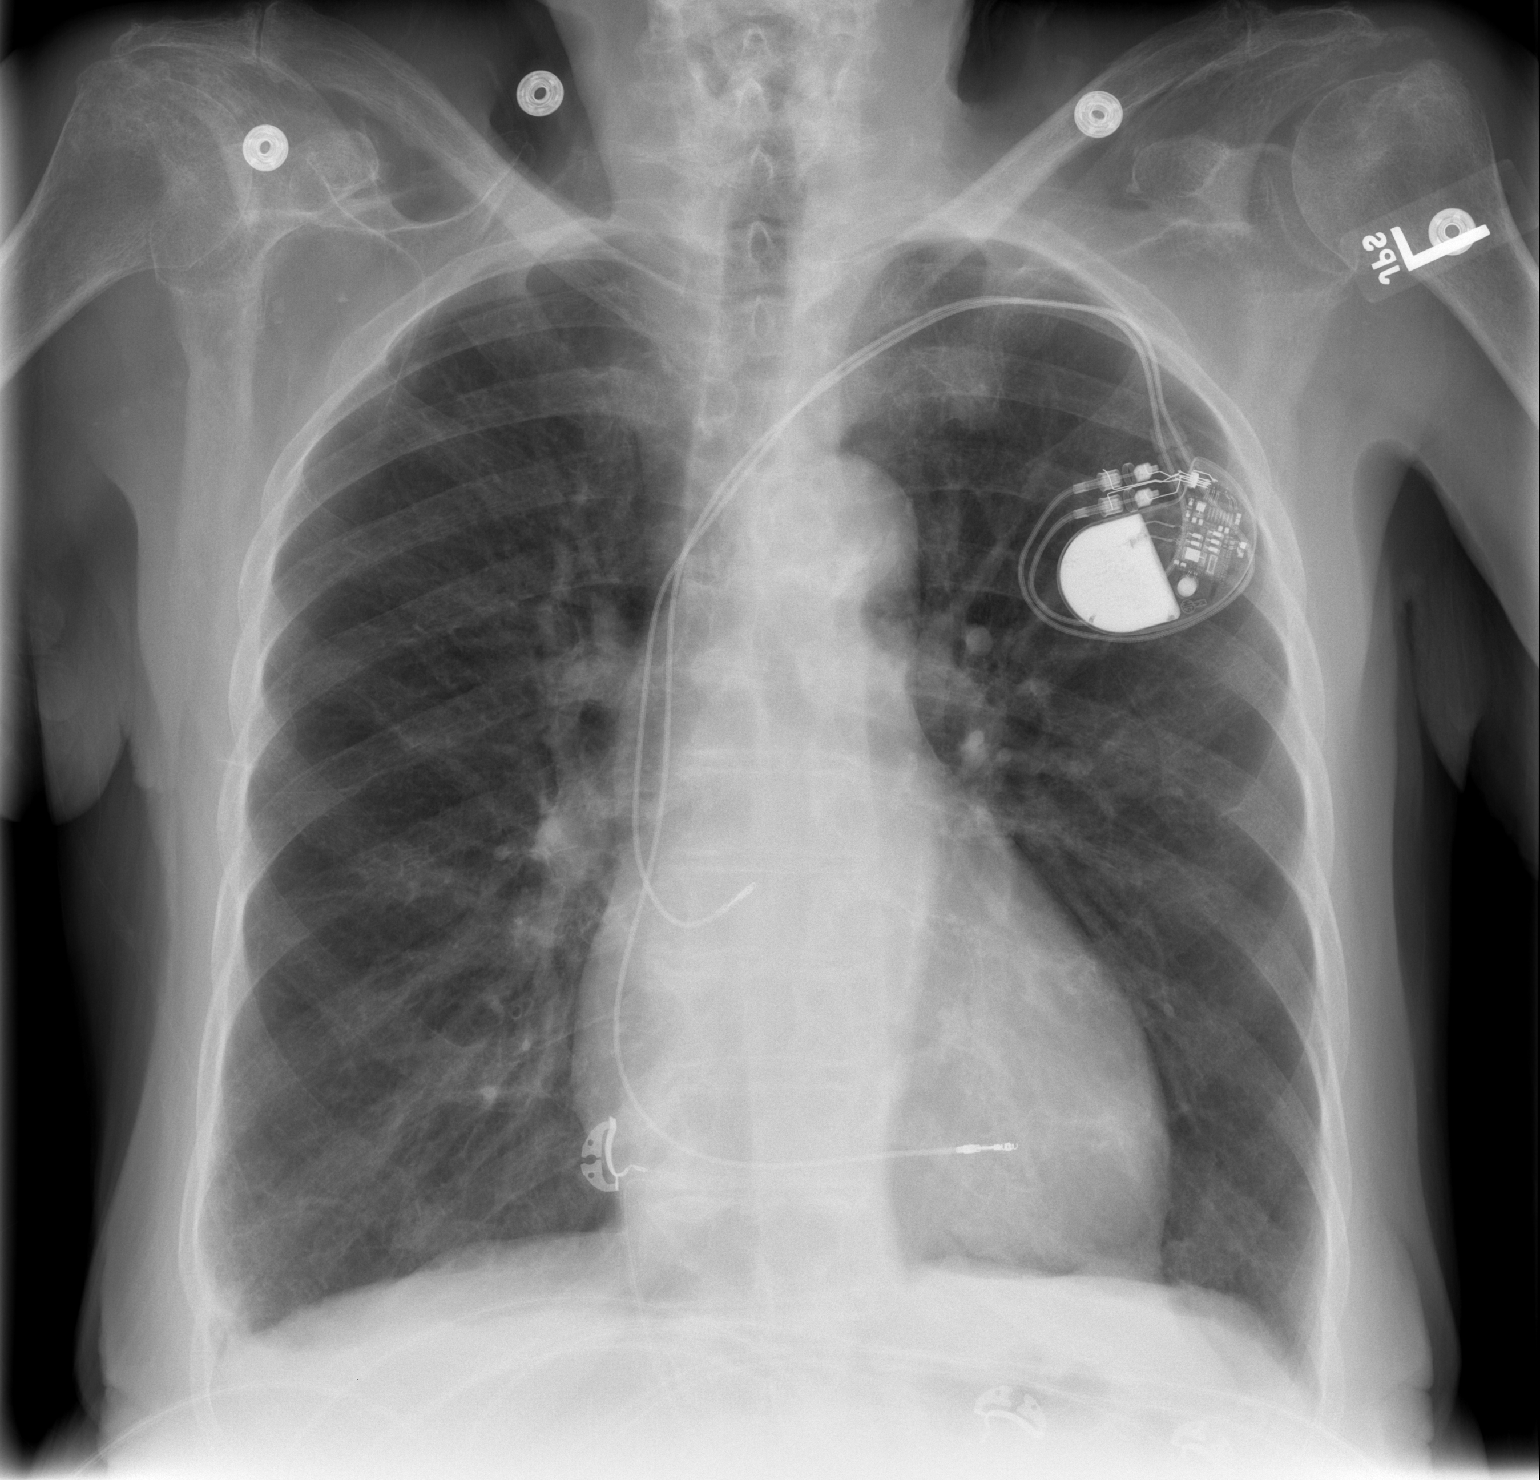

[2 of 2 positions shown; findings below may reference images not displayed]

FINDINGS: Cardiac silhouette mildly enlarged but stable.  The
subclavian dual lead transvenous pacemaker unchanged appears
intact.  Mitral annular calcification.  Thoracic aorta mildly
tortuous atherosclerotic, unchanged.  Hilar and mediastinal
contours otherwise unremarkable.  Lungs hyperinflated but clear.
No visible pleural effusions.  Degenerative changes involving the
thoracic spine.  Degenerative changes in both shoulder joints,
right greater than left.
IMPRESSION: Stable mild cardiomegaly.  Hyperinflation consistent with COPD
and/or asthma.  No acute cardiopulmonary disease.

## 2013-05-29 ENCOUNTER — Encounter: Payer: Self-pay | Admitting: *Deleted

## 2013-06-07 ENCOUNTER — Non-Acute Institutional Stay (SKILLED_NURSING_FACILITY): Payer: Medicare Other | Admitting: Internal Medicine

## 2013-06-07 DIAGNOSIS — M1990 Unspecified osteoarthritis, unspecified site: Secondary | ICD-10-CM

## 2013-06-07 DIAGNOSIS — I10 Essential (primary) hypertension: Secondary | ICD-10-CM

## 2013-06-07 DIAGNOSIS — K5900 Constipation, unspecified: Secondary | ICD-10-CM

## 2013-06-07 DIAGNOSIS — I4891 Unspecified atrial fibrillation: Secondary | ICD-10-CM

## 2013-06-07 DIAGNOSIS — F3289 Other specified depressive episodes: Secondary | ICD-10-CM

## 2013-06-07 DIAGNOSIS — E119 Type 2 diabetes mellitus without complications: Secondary | ICD-10-CM

## 2013-06-07 DIAGNOSIS — G4700 Insomnia, unspecified: Secondary | ICD-10-CM

## 2013-06-07 DIAGNOSIS — F329 Major depressive disorder, single episode, unspecified: Secondary | ICD-10-CM

## 2013-06-07 DIAGNOSIS — I509 Heart failure, unspecified: Secondary | ICD-10-CM

## 2013-06-07 DIAGNOSIS — I5030 Unspecified diastolic (congestive) heart failure: Secondary | ICD-10-CM

## 2013-06-07 DIAGNOSIS — F32A Depression, unspecified: Secondary | ICD-10-CM | POA: Insufficient documentation

## 2013-06-07 DIAGNOSIS — E039 Hypothyroidism, unspecified: Secondary | ICD-10-CM | POA: Insufficient documentation

## 2013-06-07 NOTE — Progress Notes (Signed)
Patient ID: [name], female   DOB: 27-Sep-1923, 80 y.o.   MRN: [id number]    [facility]  Chief Complaint  Patient presents with  . Medical Managment of Chronic Issues   Allergies  Allergen Reactions  . Naproxen     REACTION: gastritis   HPI 80 y/o female patient is here for long term care and seen today for routine visit. She complaints of feeling low and not being able to sleep at night. Her knee pain bothers her. No other complaints. No new concerns from nursing staff  Review of Systems  Constitutional: Negative for fever, chills, weight loss, malaise/fatigue and diaphoresis.  HENT: Negative for congestion, hearing loss and sore throat.   Eyes: Negative for blurred vision, double vision and discharge.  Respiratory: Negative for cough, sputum production, shortness of breath and wheezing.   Cardiovascular: Negative for chest pain, palpitations, orthopnea and leg swelling.  Gastrointestinal: Negative for heartburn, nausea, vomiting, abdominal pain, diarrhea  Genitourinary: Negative for dysuria Musculoskeletal: Negative for falls Skin: Negative for itching and rash.  Neurological:  Negative for dizziness, tingling, focal weakness and headaches.   Past Medical History  Diagnosis Date  . Coronary artery disease     a. Acute MI 2003, stent to LCx. b. NSTEMI 2008 (cath w/o PCI).  c. +trop 02/2011 felt 2/2 demand ischemia.  [name] History of atrial flutter 2009    a. s/p ablation of typical flutter 09/2007.  [name] Paroxysmal atrial fibrillation     a. Dx 2009. b. Discontinuation of Coumadin 01/2008 2/2 hemarthrosis/chronic debilitation.  [name] COPD (chronic obstructive pulmonary disease)   . Hyperlipidemia   . Diabetes mellitus   . Diastolic congestive heart failure   . Arthritis   . Chronic kidney disease   . Carotid artery disease     a. Bilateral  . SSS (sick sinus syndrome)     a. Symptomatic bradycardia/syncope and post-termination pauses - St. Jude dual chamber PPM  09/2007.   [name] Pneumonia   . Anxiety     adjustment disorder  . Pacemaker   . Mesenteric ischemia     a. Possible dx, 2013.  . Diverticula of colon     a. Colonoscopy 01/2012.  [name] PVD (peripheral vascular disease)     Carotid dz as noted, repoted aortoiliac occlusive dz, also reported high-grade stenosis at the origin & prox innominate artery, high-grade stenosis of the right carotid bifurcation, moderate stenosis of the proximal prevertebral left subclavian artery by CT 02/2011  . Pituitary tumor     s/p gamma knife   Current Outpatient Prescriptions on File Prior to Visit  Medication Sig Dispense Refill  . Aclidinium Bromide (TUDORZA PRESSAIR) 400 MCG/ACT AEPB Inhale 1 puff into the lungs 2 (two) times daily.      [name] amiodarone (PACERONE) 200 MG tablet Take 1 tablet (200 mg total) by mouth 2 (two) times daily.  60 tablet  3  . amLODipine (NORVASC) 10 MG tablet Take 10 mg by mouth daily.      [name] aspirin 81 MG tablet Take 81 mg by mouth daily.      [name] atorvastatin (LIPITOR) 40 MG tablet Take 1 tablet (40 mg total) by mouth daily.  90 tablet  3  . carvedilol (COREG) 25 MG tablet Take 6.25 mg by mouth 2 (two) times daily with a meal.      . cloNIDine (CATAPRES - DOSED IN MG/24 HR) 0.1 mg/24hr patch Place 0.1 mg onto the skin once a week.      [name]  cloNIDine (CATAPRES) 0.2 MG tablet Take 0.1 mg by mouth 3 (three) times daily. AS NEEDED FOR S B/P >160      . clopidogrel (PLAVIX) 75 MG tablet Take 75 mg by mouth daily with breakfast.      . digoxin (LANOXIN) 0.125 MG tablet Take 1 tablet (0.125 mg total) by mouth every other day.  90 tablet  3  . docusate sodium (COLACE) 100 MG capsule Take 100 mg by mouth daily.      . ferrous sulfate 325 (65 FE) MG tablet Take 325 mg by mouth 2 (two) times daily with a meal.      . HYDROcodone-acetaminophen (NORCO/VICODIN) 5-325 MG per tablet Take 1 tablet by mouth every 4 (four) hours as needed for pain.  30 tablet  0  . insulin aspart (NOVOLOG FLEXPEN) 100 UNIT/ML  SOPN FlexPen Inject 0-9 Units into the skin 3 (three) times daily with meals. ssi      . levothyroxine (SYNTHROID, LEVOTHROID) 50 MCG tablet Take 50 mcg by mouth daily before breakfast.      . nitroGLYCERIN (NITROSTAT) 0.4 MG SL tablet Place 0.4 mg under the tongue every 5 (five) minutes as needed. For chest pain      . polyethylene glycol (MIRALAX / GLYCOLAX) packet Take 17 g by mouth 2 (two) times daily.      . ranitidine (ZANTAC) 150 MG capsule Take 150 mg by mouth every morning.      . senna (SENOKOT) 8.6 MG TABS tablet Take 1 tablet by mouth daily as needed for mild constipation.      . torsemide (DEMADEX) 20 MG tablet Take 20 mg by mouth daily.      . traZODone (DESYREL) 100 MG tablet Take 100 mg by mouth at bedtime.       No current facility-administered medications on file prior to visit.   Physical exam BP 140/64  Pulse 64  Temp(Src) 96.6 F (35.9 C)  Resp 18  SpO2 96%   Constitutional: No distress. Thin bulit Neck: Neck supple. No JVD present.  Cardiovascular: Normal rate, regular rhythm and intact distal pulses.   Respiratory: Effort normal and breath sounds normal. No respiratory distress.  GI: Soft. Bowel sounds are normal. She exhibits no distension. There is no tenderness.  Musculoskeletal: She exhibits no edema. Is able to move extremities. No joint effusion noted Neurological: She is alert.  Skin: Skin is warm and dry. She is not diaphoretic.  Psych: flat affect, no anxiety  Labs- 02-15-13: dig 1.4   02-19-13: tsh 6.651pre-albumin 20.54; albumin 2.8; vit b12: 210   03-05-13: wbc 8.6; hgb 8.9; hct 28.3; mcv 99.6; plt 364; glucose 96; bun 46; creat 2.0; k+4.6; na++143; vit b12: 1840 02-27-13: glucose 139; bun 36; creat 1.8; k+4.6; na++ 138 03-08-13: urine culture: e-coli: vantin 03-15-13: wbc 9.8; hgb 10.7; hct 3.8; mcv 98.8; plt 358   03-26-13: wbc 6.5; hgb 9.4; hct 29.6; mcv 96.1;lt 302;   04-10-13: tsh 3.012   Assessment/plan  Diabetes: no recent a1c. Sugar  has been controlled. continue novolog SSI 3 units prior to meals for cbg >=150 and will monitor. Check a1c  Afib: rate controlled. Continue asa 81 mg daily, plavix 75 mg daily,coreg 6.25 mg bid, amiodarone 200 mg twice daily and digoxin 0.125 mg every other day. Check digoxin level. Continue lipitor  Insomnia: will continue trazodone 100 mg nightly and add melatonin 3 mg nightly for now  Depression: will have her on celexa 10 mg po daily and reassess her  mood. Encouraged group activity participation  Hypothyroidism: reviewed thyroid panel.will continue synthroid 25 mcg daily  Hypertension: stable bp readings. Continue norvasc 10 mg daily, coreg 6.25 mg twice daily, clonidine 0.1 mg weekly patch  Chronic diastolic heart failure: stable at present. will continue demadex 20 mg daily, coreg 6.25 mg bid. Monitor weight.   Constipation: continue miralax twice daily, senna daily and colace twice daily   Knee pain- from her worsening DJD. Will have her noroco prn continued and add voltaren gel prn  Labs- digoxin level. a1c

## 2013-06-26 ENCOUNTER — Telehealth: Payer: Self-pay | Admitting: Internal Medicine

## 2013-06-26 NOTE — Telephone Encounter (Signed)
06-26-13 grandaughter, [name], pt resident of [facility] and having device checks done there/mt

## 2013-06-28 ENCOUNTER — Non-Acute Institutional Stay (SKILLED_NURSING_FACILITY): Payer: Medicare Other | Admitting: Adult Health

## 2013-06-28 ENCOUNTER — Encounter: Payer: Self-pay | Admitting: Adult Health

## 2013-06-28 DIAGNOSIS — L89609 Pressure ulcer of unspecified heel, unspecified stage: Secondary | ICD-10-CM | POA: Insufficient documentation

## 2013-06-28 DIAGNOSIS — L8990 Pressure ulcer of unspecified site, unspecified stage: Secondary | ICD-10-CM

## 2013-06-28 HISTORY — DX: Pressure ulcer of unspecified heel, unspecified stage: L89.609

## 2013-06-28 NOTE — Progress Notes (Signed)
Patient ID: [name], female   DOB: 03/11/1924, 80 y.o.   MRN: [id number]     [facility]  Allergies  Allergen Reactions  . Naproxen     REACTION: gastritis    Chief Complaint  Patient presents with  . Acute Visit    wound management     HPI:  She has bilateral heel ulcerations present. They are slowly resolving; but need sharp debridement in order to remove slough and eschar. Her pain is being managed. There are no indicated of active infection present at this time.   Past Medical History  Diagnosis Date  . Coronary artery disease     a. Acute MI 2003, stent to LCx. b. NSTEMI 2008 (cath w/o PCI).  c. +trop 02/2011 felt 2/2 demand ischemia.  [name] History of atrial flutter 2009    a. s/p ablation of typical flutter 09/2007.  [name] Paroxysmal atrial fibrillation     a. Dx 2009. b. Discontinuation of Coumadin 01/2008 2/2 hemarthrosis/chronic debilitation.  [name] COPD (chronic obstructive pulmonary disease)   . Hyperlipidemia   . Diabetes mellitus   . Diastolic congestive heart failure   . Arthritis   . Chronic kidney disease   . Carotid artery disease     a. Bilateral  . SSS (sick sinus syndrome)     a. Symptomatic bradycardia/syncope and post-termination pauses - St. Jude dual chamber PPM 09/2007.   [name] Pneumonia   . Anxiety     adjustment disorder  . Pacemaker   . Mesenteric ischemia     a. Possible dx, 2013.  . Diverticula of colon     a. Colonoscopy 01/2012.  [name] PVD (peripheral vascular disease)     Carotid dz as noted, repoted aortoiliac occlusive dz, also reported high-grade stenosis at the origin & prox innominate artery, high-grade stenosis of the right carotid bifurcation, moderate stenosis of the proximal prevertebral left subclavian artery by CT 02/2011  . Pituitary tumor     s/p gamma knife    Past Surgical History  Procedure Laterality Date  . Cardiac catheterization      2003 cardiac stent  . Insert / replace / remove pacemaker  2009    St. Jude  .  Cholecystectomy    . Total knee arthroplasty      bilateral  . Bilateral hip arthroscopy    . 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 Myocardial perfusion study      EF 59% 4/04  . Adenosine myoview study      abn EF 53% 12/04/04  . Carotid doppler  2/99  . Echo with lvh  1/98    old records  . Stress cardiolite  3/04    EF 53%  . Gamma knife radiosurg  08/25/06    [facility]  . Pvd      with stent in the right iliac   . Colonoscopy  01/27/2012    Procedure: COLONOSCOPY;  Surgeon: Inda Castle, MD;  Location: [place];  Service: Endoscopy;  Laterality: N/A;    VITAL SIGNS BP 138/70  Pulse 75  Ht 5\' 5"  (1.651 m)  Wt 140 lb 1.6 oz (63.549 kg)  BMI 23.31 kg/m2   Patient's Medications  New Prescriptions   No medications on file  Previous Medications   ACLIDINIUM BROMIDE (TUDORZA PRESSAIR) 400 MCG/ACT AEPB    Inhale 1 puff into the lungs 2 (two) times daily.   AMIODARONE (PACERONE) 200 MG TABLET  Take 1 tablet (200 mg total) by mouth 2 (two) times daily.   AMLODIPINE (NORVASC) 10 MG TABLET    Take 10 mg by mouth daily.   ASPIRIN 81 MG TABLET    Take 81 mg by mouth daily.   ATORVASTATIN (LIPITOR) 40 MG TABLET    Take 1 tablet (40 mg total) by mouth daily.   CARVEDILOL (COREG) 25 MG TABLET    Take 6.25 mg by mouth 2 (two) times daily with a meal.   CITALOPRAM (CELEXA) 10 MG TABLET    Take 10 mg by mouth daily.   CLONIDINE (CATAPRES - DOSED IN MG/24 HR) 0.1 MG/24HR PATCH    Place 0.1 mg onto the skin once a week.   CLONIDINE (CATAPRES) 0.2 MG TABLET    Take 0.1 mg by mouth 3 (three) times daily. AS NEEDED FOR S B/P >160   CLOPIDOGREL (PLAVIX) 75 MG TABLET    Take 75 mg by mouth daily with breakfast.   DICLOFENAC SODIUM (VOLTAREN) 1 % GEL    Apply 2 g topically 2 (two) times daily as needed. apply to knees   DIGOXIN (LANOXIN) 0.125 MG TABLET    Take 1 tablet (0.125 mg total) by mouth every other day.   DOCUSATE SODIUM (COLACE) 100 MG  CAPSULE    Take 100 mg by mouth daily.   FERROUS SULFATE 325 (65 FE) MG TABLET    Take 325 mg by mouth 2 (two) times daily with a meal.   HYDROCODONE-ACETAMINOPHEN (NORCO/VICODIN) 5-325 MG PER TABLET    Take 1 tablet by mouth every 4 (four) hours as needed for pain.   INSULIN ASPART (NOVOLOG FLEXPEN) 100 UNIT/ML SOPN FLEXPEN    Inject 0-9 Units into the skin 3 (three) times daily with meals. ssi   LEVOTHYROXINE (SYNTHROID, LEVOTHROID) 50 MCG TABLET    Take 50 mcg by mouth daily before breakfast.   MELATONIN 3 MG CAPS    Take 3 mg by mouth at bedtime.   NITROGLYCERIN (NITROSTAT) 0.4 MG SL TABLET    Place 0.4 mg under the tongue every 5 (five) minutes as needed. For chest pain   POLYETHYLENE GLYCOL (MIRALAX / GLYCOLAX) PACKET    Take 17 g by mouth 2 (two) times daily.   RANITIDINE (ZANTAC) 150 MG CAPSULE    Take 150 mg by mouth every morning.   SENNA (SENOKOT) 8.6 MG TABS TABLET    Take 1 tablet by mouth daily as needed for mild constipation.   TORSEMIDE (DEMADEX) 20 MG TABLET    Take 20 mg by mouth daily.   TRAZODONE (DESYREL) 100 MG TABLET    Take 100 mg by mouth at bedtime.  Modified Medications   No medications on file  Discontinued Medications   No medications on file    SIGNIFICANT DIAGNOSTIC EXAMS  02-21-13: chest x-ray: minimal cardiomegaly without pulmonary vascular congestion. No pleural effusion. Old granulomatous disease; patchy interstitial pneumonitis at lung bases.   03-02-13: chest x-ray: no evidence of acute disease: has pace maker   LABS REVIEWED:   02-15-13: dig 1.4  02-19-13: tsh 6.651pre-albumin 20.54; albumin 2.8; vit b12: 210  03-05-13: wbc 8.6; hgb 8.9; hct 28.3; mcv 99.6; plt 364; glucose 96; bun 46; creat 2.0; k+4.6; na++143; vit b12: 1840 02-27-13: glucose 139; bun 36; creat 1.8; k+4.6; na++ 138 03-08-13: urine culture: e-coli: vantin 03-15-13: wbc 9.8; hgb 10.7; hct 3.8; mcv 98.8; plt 358  03-26-13: wbc 6.5; hgb 9.4; hct 29.6; mcv 96.1;lt 302;  04-10-13: tsh  3.012  06-08-13: hgb a1c 6.7      Review of Systems  Constitutional: Negative for malaise/fatigue.  Respiratory: Negative for cough and shortness of breath.   Cardiovascular: Negative for chest pain and palpitations.  Gastrointestinal: Negative for abdominal pain and constipation.  Musculoskeletal: Negative for myalgias.  Skin:       Has heel ulcers  Psychiatric/Behavioral: The patient is not nervous/anxious.     Physical Exam  Constitutional: No distress.  Thin   Neck: Neck supple. No JVD present.  Cardiovascular: Normal rate, regular rhythm and intact distal pulses.   Respiratory: Effort normal and breath sounds normal. No respiratory distress.  GI: Soft. Bowel sounds are normal. She exhibits no distension. There is no tenderness.  Musculoskeletal: She exhibits no edema.  Is able to move extremities   Neurological: She is alert.  Skin: Skin is warm and dry. She is not diaphoretic.  Left heel stage III: 1 x 1.2 x 0.3 cm:  Sharp debridement performed  Right heel 2.5 x 3 cm slough present sharp debridement performed No signs of infection present      ASSESSMENT/ PLAN:  1. For bilateral heel ulcerations will continue her current treatment and her current plan of care will continue to monitor her status for signs of infection and will monitor her healing.

## 2013-07-09 ENCOUNTER — Encounter: Payer: Self-pay | Admitting: Adult Health

## 2013-07-09 ENCOUNTER — Non-Acute Institutional Stay (SKILLED_NURSING_FACILITY): Payer: Medicare Other | Admitting: Adult Health

## 2013-07-09 DIAGNOSIS — F4323 Adjustment disorder with mixed anxiety and depressed mood: Secondary | ICD-10-CM

## 2013-07-09 DIAGNOSIS — M1990 Unspecified osteoarthritis, unspecified site: Secondary | ICD-10-CM

## 2013-07-09 DIAGNOSIS — E039 Hypothyroidism, unspecified: Secondary | ICD-10-CM

## 2013-07-09 DIAGNOSIS — E7800 Pure hypercholesterolemia, unspecified: Secondary | ICD-10-CM

## 2013-07-09 DIAGNOSIS — L8990 Pressure ulcer of unspecified site, unspecified stage: Secondary | ICD-10-CM

## 2013-07-09 DIAGNOSIS — G4700 Insomnia, unspecified: Secondary | ICD-10-CM

## 2013-07-09 DIAGNOSIS — I4891 Unspecified atrial fibrillation: Secondary | ICD-10-CM

## 2013-07-09 DIAGNOSIS — I2510 Atherosclerotic heart disease of native coronary artery without angina pectoris: Secondary | ICD-10-CM

## 2013-07-09 DIAGNOSIS — J449 Chronic obstructive pulmonary disease, unspecified: Secondary | ICD-10-CM

## 2013-07-09 DIAGNOSIS — I5030 Unspecified diastolic (congestive) heart failure: Secondary | ICD-10-CM

## 2013-07-09 DIAGNOSIS — I509 Heart failure, unspecified: Secondary | ICD-10-CM

## 2013-07-09 DIAGNOSIS — L89609 Pressure ulcer of unspecified heel, unspecified stage: Secondary | ICD-10-CM

## 2013-07-09 DIAGNOSIS — K5900 Constipation, unspecified: Secondary | ICD-10-CM

## 2013-07-09 DIAGNOSIS — Z950 Presence of cardiac pacemaker: Secondary | ICD-10-CM

## 2013-07-09 NOTE — Progress Notes (Signed)
Patient ID: [name], female   DOB: [birth year], 80 y.o.   MRN: [id number]     [facility]  Allergies  Allergen Reactions  . Naproxen     REACTION: gastritis     Chief Complaint  Patient presents with  . Medical Managment of Chronic Issues    HPI:  She is being seen for the medical management of her chronic illnesses. There has been no significant change in her recent status. There are no concerns being voiced by the nursing staff. She is not voicing any concerns at this time.     Past Medical History  Diagnosis Date  . Coronary artery disease     a. Acute MI 2003, stent to LCx. b. NSTEMI 2008 (cath w/o PCI).  c. +trop 02/2011 felt 2/2 demand ischemia.  [name] History of atrial flutter 2009    a. s/p ablation of typical flutter 09/2007.  [name] Paroxysmal atrial fibrillation     a. Dx 2009. b. Discontinuation of Coumadin 01/2008 2/2 hemarthrosis/chronic debilitation.  [name] COPD (chronic obstructive pulmonary disease)   . Hyperlipidemia   . Diabetes mellitus   . Diastolic congestive heart failure   . Arthritis   . Chronic kidney disease   . Carotid artery disease     a. Bilateral  . SSS (sick sinus syndrome)     a. Symptomatic bradycardia/syncope and post-termination pauses - St. Jude dual chamber PPM 09/2007.   [name] Pneumonia   . Anxiety     adjustment disorder  . Pacemaker   . Mesenteric ischemia     a. Possible dx, 2013.  . Diverticula of colon     a. Colonoscopy 01/2012.  [name] PVD (peripheral vascular disease)     Carotid dz as noted, repoted aortoiliac occlusive dz, also reported high-grade stenosis at the origin & prox innominate artery, high-grade stenosis of the right carotid bifurcation, moderate stenosis of the proximal prevertebral left subclavian artery by CT 02/2011  . Pituitary tumor     s/p gamma knife    Past Surgical History  Procedure Laterality Date  . Cardiac catheterization      2003 cardiac stent  . Insert / replace / remove pacemaker  2009    St.  Jude  . Cholecystectomy    . Total knee arthroplasty      bilateral  . Bilateral hip arthroscopy    . 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 Myocardial perfusion study      EF 59% 4/04  . Adenosine myoview study      abn EF 53% 12/04/04  . Carotid doppler  2/99  . Echo with lvh  1/98    old records  . Stress cardiolite  3/04    EF 53%  . Gamma knife radiosurg  08/25/06    [facility]  . Pvd      with stent in the right iliac   . Colonoscopy  01/27/2012    Procedure: COLONOSCOPY;  Surgeon: Inda Castle, MD;  Location: [place];  Service: Endoscopy;  Laterality: N/A;    VITAL SIGNS BP 127/68  Pulse 72  Ht 5\' 5"  (1.651 m)  Wt 140 lb 1.6 oz (63.549 kg)  BMI 23.31 kg/m2   Patient's Medications  New Prescriptions   No medications on file  Previous Medications   ACLIDINIUM BROMIDE (TUDORZA PRESSAIR) 400 MCG/ACT AEPB    Inhale 1 puff into the lungs 2 (two) times daily.   AMIODARONE (PACERONE) 200  MG TABLET    Take 1 tablet (200 mg total) by mouth 2 (two) times daily.   AMLODIPINE (NORVASC) 10 MG TABLET    Take 10 mg by mouth daily.   ASPIRIN 81 MG TABLET    Take 81 mg by mouth daily.   ATORVASTATIN (LIPITOR) 40 MG TABLET    Take 1 tablet (40 mg total) by mouth daily.   CARVEDILOL (COREG) 25 MG TABLET    Take 6.25 mg by mouth 2 (two) times daily with a meal.   CITALOPRAM (CELEXA) 10 MG TABLET    Take 10 mg by mouth daily.   CLONIDINE (CATAPRES - DOSED IN MG/24 HR) 0.1 MG/24HR PATCH    Place 0.1 mg onto the skin once a week.   CLONIDINE (CATAPRES) 0.2 MG TABLET    Take 0.1 mg by mouth 3 (three) times daily. AS NEEDED FOR S B/P >160   CLOPIDOGREL (PLAVIX) 75 MG TABLET    Take 75 mg by mouth daily with breakfast.   DICLOFENAC SODIUM (VOLTAREN) 1 % GEL    Apply 2 g topically 2 (two) times daily as needed. apply to knees   DIGOXIN (LANOXIN) 0.125 MG TABLET    Take 1 tablet (0.125 mg total) by mouth every other day.   DOCUSATE SODIUM (COLACE)  100 MG CAPSULE    Take 100 mg by mouth 2 (two) times daily.    FERROUS SULFATE 325 (65 FE) MG TABLET    Take 325 mg by mouth 2 (two) times daily with a meal.   HYDROCODONE-ACETAMINOPHEN (NORCO/VICODIN) 5-325 MG PER TABLET    Take 1 tablet by mouth every 4 (four) hours as needed for pain.   INSULIN ASPART (NOVOLOG FLEXPEN) 100 UNIT/ML SOPN FLEXPEN    Inject 3 Units into the skin 3 (three) times daily with meals. 3 units ac meals cbg >=150   LEVOTHYROXINE (SYNTHROID, LEVOTHROID) 50 MCG TABLET    Take 25 mcg by mouth daily before breakfast.    MELATONIN 3 MG CAPS    Take 3 mg by mouth at bedtime.   NITROGLYCERIN (NITROSTAT) 0.4 MG SL TABLET    Place 0.4 mg under the tongue every 5 (five) minutes as needed. For chest pain   POLYETHYLENE GLYCOL (MIRALAX / GLYCOLAX) PACKET    Take 17 g by mouth 2 (two) times daily.   RANITIDINE (ZANTAC) 150 MG CAPSULE    Take 150 mg by mouth every morning.   SENNA (SENOKOT) 8.6 MG TABS TABLET    Take 1 tablet by mouth daily as needed for mild constipation.   TORSEMIDE (DEMADEX) 20 MG TABLET    Take 20 mg by mouth daily.   TRAZODONE (DESYREL) 100 MG TABLET    Take 100 mg by mouth at bedtime.  Modified Medications   No medications on file  Discontinued Medications   No medications on file    SIGNIFICANT DIAGNOSTIC EXAMS  02-21-13: chest x-ray: minimal cardiomegaly without pulmonary vascular congestion. No pleural effusion. Old granulomatous disease; patchy interstitial pneumonitis at lung bases.   03-02-13: chest x-ray: no evidence of acute disease: has pace maker   LABS REVIEWED:   02-15-13: dig 1.4  02-19-13: tsh 6.651pre-albumin 20.54; albumin 2.8; vit b12: 210  03-05-13: wbc 8.6; hgb 8.9; hct 28.3; mcv 99.6; plt 364; glucose 96; bun 46; creat 2.0; k+4.6; na++143; vit b12: 1840 02-27-13: glucose 139; bun 36; creat 1.8; k+4.6; na++ 138 03-08-13: urine culture: e-coli: vantin 03-15-13: wbc 9.8; hgb 10.7; hct 3.8; mcv 98.8; plt 358  03-26-13: wbc 6.5; hgb 9.4;  hct 29.6; mcv 96.1;lt 302;  04-10-13: tsh 3.012  06-08-13: hgb a1c 6.7      Review of Systems  Constitutional: Negative for malaise/fatigue.  Respiratory: Negative for cough and shortness of breath.   Cardiovascular: Negative for chest pain and palpitations.  Gastrointestinal: Negative for abdominal pain and constipation.  Musculoskeletal: Negative for myalgias.  Skin:       Has heel ulcers  Psychiatric/Behavioral: The patient is not nervous/anxious.     Physical Exam  Constitutional: No distress.  Thin   Neck: Neck supple. No JVD present.  Cardiovascular: Normal rate, regular rhythm and intact distal pulses.   Respiratory: Effort normal and breath sounds normal. No respiratory distress.  GI: Soft. Bowel sounds are normal. She exhibits no distension. There is no tenderness.  Musculoskeletal: She exhibits no edema.  Is able to move extremities   Neurological: She is alert.  Skin: Skin is warm and dry. She is not diaphoretic.  Left heel ulceration: 1.2 x 0.6 x 0.2 cm stage III 10% slough present treated with santyl no signs of infection present.       ASSESSMENT/ PLAN:  1. Left heel ulceration: will continue her current treatment and plan of care and will monitor her status   2. Afib: her heart rate is stable; will continue digoxin 0.125 mg daily for rate control and will continue asa 81 mg daily   3. Hypothyroidism: will continue synthroid 25 mcg daily   4. Hypertension: is stable will continue norvasc 10 mg daily; coreg 6.25 mg twice daily; clonidine patch 0.3 mg weekly and clonidine 0.1 mg three times daily as needed   5. CAD: no recent complaint of chest pain present; will continue asa 81 mg daily; plavix 75 mg daily  and ntg prn will monitor  6. Copd: is stable will continue albuterol 2 puffs every 6 hours as needed; tudorza twice daily and will monitor   7. Chronic diastolic heart failure: is stable will continue demadex 20 mg daily and will monitor  8.  Dyslipidemia: will continue lipitor 4 mg daily   9. Anemia: will continue iron twice daily  10. Gerd: will continue zantac 150 mg twice daily  11. Depression: is emotionally stable will continue celexa 10 mg daily  12.  Insomnia: is stable will continue trazdodone 100 mg nightly and melatonin 3 mg nightly   13. Constipation: will continue miralax twice daily and colace twice daily   14. Osteoarthritis: is stable will continue voltaren gel to both knees twice daily as needed and has vicodin 5/325 mg every 4 hours as needed   Time spent with patient 50 minutes.     Ok Edwards NP [facility]  Contact [phone] Monday through Friday 8am- 5pm  After hours call [phone]

## 2013-07-15 ENCOUNTER — Encounter: Payer: Self-pay | Admitting: Adult Health

## 2013-08-01 ENCOUNTER — Non-Acute Institutional Stay (SKILLED_NURSING_FACILITY): Payer: Medicare Other | Admitting: Adult Health

## 2013-08-01 DIAGNOSIS — I2510 Atherosclerotic heart disease of native coronary artery without angina pectoris: Secondary | ICD-10-CM

## 2013-08-01 DIAGNOSIS — J449 Chronic obstructive pulmonary disease, unspecified: Secondary | ICD-10-CM

## 2013-08-01 DIAGNOSIS — E1159 Type 2 diabetes mellitus with other circulatory complications: Secondary | ICD-10-CM

## 2013-08-01 DIAGNOSIS — I4891 Unspecified atrial fibrillation: Secondary | ICD-10-CM

## 2013-08-01 DIAGNOSIS — I509 Heart failure, unspecified: Secondary | ICD-10-CM

## 2013-08-01 DIAGNOSIS — F329 Major depressive disorder, single episode, unspecified: Secondary | ICD-10-CM

## 2013-08-01 DIAGNOSIS — L8990 Pressure ulcer of unspecified site, unspecified stage: Secondary | ICD-10-CM

## 2013-08-01 DIAGNOSIS — I10 Essential (primary) hypertension: Secondary | ICD-10-CM

## 2013-08-01 DIAGNOSIS — I5030 Unspecified diastolic (congestive) heart failure: Secondary | ICD-10-CM

## 2013-08-01 DIAGNOSIS — F3289 Other specified depressive episodes: Secondary | ICD-10-CM

## 2013-08-01 DIAGNOSIS — K219 Gastro-esophageal reflux disease without esophagitis: Secondary | ICD-10-CM

## 2013-08-01 DIAGNOSIS — E039 Hypothyroidism, unspecified: Secondary | ICD-10-CM

## 2013-08-01 DIAGNOSIS — L89609 Pressure ulcer of unspecified heel, unspecified stage: Secondary | ICD-10-CM

## 2013-08-01 DIAGNOSIS — F32A Depression, unspecified: Secondary | ICD-10-CM

## 2013-08-01 DIAGNOSIS — K5900 Constipation, unspecified: Secondary | ICD-10-CM

## 2013-08-01 DIAGNOSIS — G4700 Insomnia, unspecified: Secondary | ICD-10-CM

## 2013-08-09 ENCOUNTER — Encounter: Payer: Self-pay | Admitting: Adult Health

## 2013-08-09 DIAGNOSIS — E1159 Type 2 diabetes mellitus with other circulatory complications: Secondary | ICD-10-CM | POA: Insufficient documentation

## 2013-08-09 DIAGNOSIS — K219 Gastro-esophageal reflux disease without esophagitis: Secondary | ICD-10-CM

## 2013-08-09 HISTORY — DX: Gastro-esophageal reflux disease without esophagitis: K21.9

## 2013-08-09 NOTE — Progress Notes (Signed)
Patient ID: [name], female   DOB: 02/26/1924, 80 y.o.   MRN: [id number]     [facility]  Allergies  Allergen Reactions  . Naproxen     REACTION: gastritis     Chief Complaint  Patient presents with  . Medical Managment of Chronic Issues    HPI:  She is being seen for the management of her chronic illnesses. Overall there is no recent significant change in her status. She is not voicing any concerns or complaints at this time. The nursing staff is not voicing any concerns at this time.    Past Medical History  Diagnosis Date  . Coronary artery disease     a. Acute MI 2003, stent to LCx. b. NSTEMI 2008 (cath w/o PCI).  c. +trop 02/2011 felt 2/2 demand ischemia.  [name] History of atrial flutter 2009    a. s/p ablation of typical flutter 09/2007.  [name] Paroxysmal atrial fibrillation     a. Dx 2009. b. Discontinuation of Coumadin 01/2008 2/2 hemarthrosis/chronic debilitation.  [name] COPD (chronic obstructive pulmonary disease)   . Hyperlipidemia   . Diabetes mellitus   . Diastolic congestive heart failure   . Arthritis   . Chronic kidney disease   . Carotid artery disease     a. Bilateral  . SSS (sick sinus syndrome)     a. Symptomatic bradycardia/syncope and post-termination pauses - St[employer] Jude dual chamber PPM 09/2007.   [name] Pneumonia   . Anxiety     adjustment disorder  . Pacemaker   . Mesenteric ischemia     a. Possible dx, 2013.  . Diverticula of colon     a. Colonoscopy 01/2012.  [name] PVD (peripheral vascular disease)     Carotid dz as noted, repoted aortoiliac occlusive dz, also reported high-grade stenosis at the origin & prox innominate artery, high-grade stenosis of the right carotid bifurcation, moderate stenosis of the proximal prevertebral left subclavian artery by CT 02/2011  . Pituitary tumor     s/p gamma knife  . Tachy-brady syndrome   . ?? Subclavian artery stenosis, right 04/04/2012  . SMA stenosis-moderate 04/04/2012  . Diverticulosis of colon (without  mention of hemorrhage) 01/27/2012  . 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 CAROTID BRUIT, RIGHT 04/26/2007    Qualifier: Diagnosis of  By: Maxie Better FNP, Rosalita Levan     Past Surgical History  Procedure Laterality Date  . Cardiac catheterization      2003 cardiac stent  . Insert / replace / remove pacemaker  2009    St. Jude  . Cholecystectomy    . Total knee arthroplasty      bilateral  . Bilateral hip arthroscopy    . 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 Myocardial perfusion study      EF 59% 4/04  . Adenosine myoview study      abn EF 53% 12/04/04  . Carotid doppler  2/99  . Echo with lvh  1/98    old records  . Stress cardiolite  3/04    EF 53%  . Gamma knife radiosurg  08/25/06    [facility]  . Pvd      with stent in the right iliac   . Colonoscopy  01/27/2012    Procedure: COLONOSCOPY;  Surgeon: Inda Castle, MD;  Location: [place];  Service: Endoscopy;  Laterality: N/A;  VITAL SIGNS BP 129/70  Pulse 70  Ht 5\' 5"  (1.651 m)  Wt 140 lb (63.504 kg)  BMI 23.30 kg/m2   Patient's Medications  New Prescriptions   No medications on file  Previous Medications   ACLIDINIUM BROMIDE (TUDORZA PRESSAIR) 400 MCG/ACT AEPB    Inhale 1 puff into the lungs 2 (two) times daily.   AMIODARONE (PACERONE) 200 MG TABLET    Take 1 tablet (200 mg total) by mouth 2 (two) times daily.   AMLODIPINE (NORVASC) 10 MG TABLET    Take 10 mg by mouth daily.   ASPIRIN 81 MG TABLET    Take 81 mg by mouth daily.   ATORVASTATIN (LIPITOR) 40 MG TABLET    Take 1 tablet (40 mg total) by mouth daily.   CARVEDILOL (COREG) 25 MG TABLET    Take 6.25 mg by mouth 2 (two) times daily with a meal.   CITALOPRAM (CELEXA) 10 MG TABLET    Take 10 mg by mouth daily.   CLONIDINE (CATAPRES - DOSED IN MG/24 HR) 0.1 MG/24HR PATCH    Place 0.1 mg onto the skin once a week.   CLONIDINE  (CATAPRES) 0.2 MG TABLET    Take 0.1 mg by mouth 3 (three) times daily. AS NEEDED FOR S B/P >160   CLOPIDOGREL (PLAVIX) 75 MG TABLET    Take 75 mg by mouth daily with breakfast.   DIGOXIN (LANOXIN) 0.125 MG TABLET    Take 1 tablet (0.125 mg total) by mouth every other day.   DOCUSATE SODIUM (COLACE) 100 MG CAPSULE    Take 100 mg by mouth 2 (two) times daily.    FERROUS SULFATE 325 (65 FE) MG TABLET    Take 325 mg by mouth 2 (two) times daily with a meal.   HYDROCODONE-ACETAMINOPHEN (NORCO/VICODIN) 5-325 MG PER TABLET    Take 1 tablet by mouth every 4 (four) hours as needed for pain.   INSULIN ASPART (NOVOLOG FLEXPEN) 100 UNIT/ML SOPN FLEXPEN    Inject 3 Units into the skin 3 (three) times daily with meals. 3 units ac meals cbg >=150   LEVOTHYROXINE (SYNTHROID, LEVOTHROID) 50 MCG TABLET    Take 25 mcg by mouth daily before breakfast.    MELATONIN 3 MG CAPS    Take 3 mg by mouth at bedtime.   NITROGLYCERIN (NITROSTAT) 0.4 MG SL TABLET    Place 0.4 mg under the tongue every 5 (five) minutes as needed. For chest pain   POLYETHYLENE GLYCOL (MIRALAX / GLYCOLAX) PACKET    Take 17 g by mouth 2 (two) times daily.   RANITIDINE (ZANTAC) 150 MG CAPSULE    Take 150 mg by mouth every morning.   SENNA (SENOKOT) 8.6 MG TABS TABLET    Take 1 tablet by mouth daily as needed for mild constipation.   TORSEMIDE (DEMADEX) 20 MG TABLET    Take 20 mg by mouth daily.   TRAZODONE (DESYREL) 100 MG TABLET    Take 100 mg by mouth at bedtime.  Modified Medications   No medications on file  Discontinued Medications   DICLOFENAC SODIUM (VOLTAREN) 1 % GEL    Apply 2 g topically 2 (two) times daily as needed. apply to knees    SIGNIFICANT DIAGNOSTIC EXAMS   02-21-13: chest x-ray: minimal cardiomegaly without pulmonary vascular congestion. No pleural effusion. Old granulomatous disease; patchy interstitial pneumonitis at lung bases.   03-02-13: chest x-ray: no evidence of acute disease: has pace maker   LABS REVIEWED:    02-15-13:  dig 1.4  02-19-13: tsh 6.651pre-albumin 20.54; albumin 2.8; vit b12: 210  03-05-13: wbc 8.6; hgb 8.9; hct 28.3; mcv 99.6; plt 364; glucose 96; bun 46; creat 2.0; k+4.6; na++143; vit b12: 1840 02-27-13: glucose 139; bun 36; creat 1.8; k+4.6; na++ 138 03-08-13: urine culture: e-coli: vantin 03-15-13: wbc 9.8; hgb 10.7; hct 3.8; mcv 98.8; plt 358  03-26-13: wbc 6.5; hgb 9.4; hct 29.6; mcv 96.1;lt 302;  04-10-13: tsh 3.012  06-08-13: hgb a1c 6.7      Review of Systems  Constitutional: Negative for malaise/fatigue.  Respiratory: Negative for cough and shortness of breath.   Cardiovascular: Negative for chest pain and palpitations.  Gastrointestinal: Negative for abdominal pain and constipation.  Musculoskeletal: Negative for myalgias.  Skin:       Has heel ulcers  Psychiatric/Behavioral: The patient is not nervous/anxious.     Physical Exam  Constitutional: No distress.  Thin   Neck: Neck supple. No JVD present.  Cardiovascular: Normal rate, regular rhythm and intact distal pulses.   Respiratory: Effort normal and breath sounds normal. No respiratory distress.  GI: Soft. Bowel sounds are normal. She exhibits no distension. There is no tenderness.  Musculoskeletal: She exhibits no edema.  Is able to move extremities   Neurological: She is alert.  Skin: Skin is warm and dry. She is not diaphoretic.  Left heel ulceration: 1 x 0.5 x 0.1: treated with santyl Right heel ulceration: 1.4 x 2: treated with santyl        ASSESSMENT/ PLAN:  1. Bilateral  heel ulceration: will continue her current treatment and plan of care and will monitor her status   2. Afib: her heart rate is stable; is status post pace maker will continue digoxin 0.125 mg daily for rate control and will continue asa 81 mg daily   3. Hypothyroidism: will continue synthroid 25 mcg daily   4. Hypertension: is stable will continue norvasc 10 mg daily; coreg 6.25 mg twice daily; clonidine patch 0.3 mg  weekly and clonidine 0.1 mg three times daily as needed   5. CAD: no recent complaint of chest pain present; will continue asa 81 mg daily; plavix 75 mg daily  and ntg prn will monitor  6. Copd: is stable will continue albuterol 2 puffs every 6 hours as needed; tudorza twice daily and will monitor   7. Chronic diastolic heart failure: is stable will continue demadex 20 mg daily and will monitor  8. Dyslipidemia: will continue lipitor 4 mg daily   9. Anemia: will continue iron twice daily  10. Gerd: will continue zantac 150 mg twice daily  11. Depression: is emotionally stable will continue celexa 10 mg daily  12.  Insomnia: is stable will continue trazdodone 100 mg nightly and melatonin 3 mg nightly   13. Constipation: will continue miralax twice daily and colace twice daily   14. Osteoarthritis: is stable will continue  vicodin 5/325 mg every 4 hours as needed  15. Diabetes: will continue humalog 3 units prior to meals for cbg >=150    Time spent with patient 50 minutes.          Ok Edwards NP [facility]  Contact [phone] Monday through Friday 8am- 5pm  After hours call [phone]

## 2013-08-20 LAB — LIPID PANEL
Cholesterol: 118 mg/dL (ref 0–200)
HDL: 54 mg/dL (ref 35–70)
LDL Cholesterol: 52 mg/dL
Triglycerides: 61 mg/dL (ref 40–160)

## 2013-08-30 ENCOUNTER — Non-Acute Institutional Stay (SKILLED_NURSING_FACILITY): Payer: Medicare Other | Admitting: Adult Health

## 2013-08-30 DIAGNOSIS — I5030 Unspecified diastolic (congestive) heart failure: Secondary | ICD-10-CM

## 2013-08-30 DIAGNOSIS — F3289 Other specified depressive episodes: Secondary | ICD-10-CM

## 2013-08-30 DIAGNOSIS — F32A Depression, unspecified: Secondary | ICD-10-CM

## 2013-08-30 DIAGNOSIS — I509 Heart failure, unspecified: Secondary | ICD-10-CM

## 2013-08-30 DIAGNOSIS — M1990 Unspecified osteoarthritis, unspecified site: Secondary | ICD-10-CM

## 2013-08-30 DIAGNOSIS — E039 Hypothyroidism, unspecified: Secondary | ICD-10-CM

## 2013-08-30 DIAGNOSIS — K5900 Constipation, unspecified: Secondary | ICD-10-CM

## 2013-08-30 DIAGNOSIS — L8990 Pressure ulcer of unspecified site, unspecified stage: Secondary | ICD-10-CM

## 2013-08-30 DIAGNOSIS — J449 Chronic obstructive pulmonary disease, unspecified: Secondary | ICD-10-CM

## 2013-08-30 DIAGNOSIS — I10 Essential (primary) hypertension: Secondary | ICD-10-CM

## 2013-08-30 DIAGNOSIS — E1159 Type 2 diabetes mellitus with other circulatory complications: Secondary | ICD-10-CM

## 2013-08-30 DIAGNOSIS — K219 Gastro-esophageal reflux disease without esophagitis: Secondary | ICD-10-CM

## 2013-08-30 DIAGNOSIS — L89609 Pressure ulcer of unspecified heel, unspecified stage: Secondary | ICD-10-CM

## 2013-08-30 DIAGNOSIS — E785 Hyperlipidemia, unspecified: Secondary | ICD-10-CM

## 2013-08-30 DIAGNOSIS — I2510 Atherosclerotic heart disease of native coronary artery without angina pectoris: Secondary | ICD-10-CM

## 2013-08-30 DIAGNOSIS — G4700 Insomnia, unspecified: Secondary | ICD-10-CM

## 2013-08-30 DIAGNOSIS — F329 Major depressive disorder, single episode, unspecified: Secondary | ICD-10-CM

## 2013-08-30 DIAGNOSIS — I4891 Unspecified atrial fibrillation: Secondary | ICD-10-CM

## 2013-09-03 ENCOUNTER — Non-Acute Institutional Stay (SKILLED_NURSING_FACILITY): Payer: Medicare Other | Admitting: Adult Health

## 2013-09-03 DIAGNOSIS — J4489 Other specified chronic obstructive pulmonary disease: Secondary | ICD-10-CM

## 2013-09-03 DIAGNOSIS — J449 Chronic obstructive pulmonary disease, unspecified: Secondary | ICD-10-CM

## 2013-09-03 DIAGNOSIS — F329 Major depressive disorder, single episode, unspecified: Secondary | ICD-10-CM

## 2013-09-03 DIAGNOSIS — F32A Depression, unspecified: Secondary | ICD-10-CM

## 2013-09-03 DIAGNOSIS — F3289 Other specified depressive episodes: Secondary | ICD-10-CM

## 2013-09-07 ENCOUNTER — Encounter: Payer: Self-pay | Admitting: Adult Health

## 2013-09-07 NOTE — Progress Notes (Signed)
Patient ID: [name], female   DOB: May 16, 1924, 80 y.o.   MRN: [id number]     [facility]  Allergies  Allergen Reactions  . Naproxen     REACTION: gastritis     Chief Complaint  Patient presents with  . Medical Management of Chronic Issues    HPI:  She is being seen for the management of her chronic illnesses. Overall she is doing well. Her heel ulcerations are resolving. There are no concerns being voiced by the nursing staff at this time. She is not voicing any concerns at this time.    Past Medical History  Diagnosis Date  . Coronary artery disease     a. Acute MI 2003, stent to LCx. b. NSTEMI 2008 (cath w/o PCI).  c. +trop 02/2011 felt 2/2 demand ischemia.  [name] History of atrial flutter 2009    a. s/p ablation of typical flutter 09/2007.  [name] Paroxysmal atrial fibrillation     a. Dx 2009. b. Discontinuation of Coumadin 01/2008 2/2 hemarthrosis/chronic debilitation.  [name] COPD (chronic obstructive pulmonary disease)   . Hyperlipidemia   . Diabetes mellitus   . Diastolic congestive heart failure   . Arthritis   . Chronic kidney disease   . Carotid artery disease     a. Bilateral  . SSS (sick sinus syndrome)     a. Symptomatic bradycardia/syncope and post-termination pauses - St. Jude dual chamber PPM 09/2007.   [name] Pneumonia   . Anxiety     adjustment disorder  . Pacemaker   . Mesenteric ischemia     a. Possible dx, 2013.  . Diverticula of colon     a. Colonoscopy 01/2012.  [name] PVD (peripheral vascular disease)     Carotid dz as noted, repoted aortoiliac occlusive dz, also reported high-grade stenosis at the origin & prox innominate artery, high-grade stenosis of the right carotid bifurcation, moderate stenosis of the proximal prevertebral left subclavian artery by CT 02/2011  . Pituitary tumor     s/p gamma knife  . Tachy-brady syndrome   . ?? Subclavian artery stenosis, right 04/04/2012  . SMA stenosis-moderate 04/04/2012  . Diverticulosis of colon (without  mention of hemorrhage) 01/27/2012  . 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 CAROTID BRUIT, RIGHT 04/26/2007    Qualifier: Diagnosis of  By: Maxie Better FNP, Rosalita Levan     Past Surgical History  Procedure Laterality Date  . Cardiac catheterization      2003 cardiac stent  . Insert / replace / remove pacemaker  2009    [employer] Jude  . Cholecystectomy    . Total knee arthroplasty      bilateral  . Bilateral hip arthroscopy    . 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 Myocardial perfusion study      EF 59% 4/04  . Adenosine myoview study      abn EF 53% 12/04/04  . Carotid doppler  2/99  . Echo with lvh  1/98    old records  . Stress cardiolite  3/04    EF 53%  . Gamma knife radiosurg  08/25/06    [facility]  . Pvd      with stent in the right iliac   . Colonoscopy  01/27/2012    Procedure: COLONOSCOPY;  Surgeon: Inda Castle, MD;  Location: [place];  Service: Endoscopy;  Laterality: N/A;  VITAL SIGNS BP 102/60  Pulse 70  Ht 5\' 5"  (1.651 m)  Wt 139 lb 12.8 oz (63.413 kg)  BMI 23.26 kg/m2   Patient's Medications  New Prescriptions   No medications on file  Previous Medications   ACLIDINIUM BROMIDE (TUDORZA PRESSAIR) 400 MCG/ACT AEPB    Inhale 1 puff into the lungs 2 (two) times daily.   ALBUTEROL (PROVENTIL HFA;VENTOLIN HFA) 108 (90 BASE) MCG/ACT INHALER    Inhale 2 puffs into the lungs every 6 (six) hours as needed for wheezing or shortness of breath.   AMIODARONE (PACERONE) 200 MG TABLET    Take 1 tablet (200 mg total) by mouth 2 (two) times daily.   AMLODIPINE (NORVASC) 10 MG TABLET    Take 10 mg by mouth daily.   ASPIRIN 81 MG TABLET    Take 81 mg by mouth daily.   ATORVASTATIN (LIPITOR) 40 MG TABLET    Take 1 tablet (40 mg total) by mouth daily.   CARVEDILOL (COREG) 25 MG TABLET    Take 6.25 mg by mouth 2 (two) times daily with a meal.    CITALOPRAM (CELEXA) 10 MG TABLET    Take 10 mg by mouth daily.   CLONIDINE (CATAPRES - DOSED IN MG/24 HR) 0.1 MG/24HR PATCH    Place 0.1 mg onto the skin once a week.   CLONIDINE (CATAPRES) 0.2 MG TABLET    Take 0.1 mg by mouth 3 (three) times daily. AS NEEDED FOR S B/P >160   CLOPIDOGREL (PLAVIX) 75 MG TABLET    Take 75 mg by mouth daily with breakfast.   DIGOXIN (LANOXIN) 0.125 MG TABLET    Take 1 tablet (0.125 mg total) by mouth every other day.   DOCUSATE SODIUM (COLACE) 100 MG CAPSULE    Take 100 mg by mouth 2 (two) times daily.    FERROUS SULFATE 325 (65 FE) MG TABLET    Take 325 mg by mouth 2 (two) times daily with a meal.   HYDROCODONE-ACETAMINOPHEN (NORCO/VICODIN) 5-325 MG PER TABLET    Take 1 tablet by mouth every 4 (four) hours as needed for pain.   INSULIN ASPART (NOVOLOG FLEXPEN) 100 UNIT/ML SOPN FLEXPEN    Inject 3 Units into the skin 3 (three) times daily with meals. 3 units ac meals cbg >=150   LEVOTHYROXINE (SYNTHROID, LEVOTHROID) 50 MCG TABLET    Take 25 mcg by mouth daily before breakfast.    MELATONIN 3 MG CAPS    Take 3 mg by mouth at bedtime.   NITROGLYCERIN (NITROSTAT) 0.4 MG SL TABLET    Place 0.4 mg under the tongue every 5 (five) minutes as needed. For chest pain   POLYETHYLENE GLYCOL (MIRALAX / GLYCOLAX) PACKET    Take 17 g by mouth 2 (two) times daily.   RANITIDINE (ZANTAC) 150 MG CAPSULE    Take 150 mg by mouth every morning.   TORSEMIDE (DEMADEX) 20 MG TABLET    Take 20 mg by mouth daily.   TRAZODONE (DESYREL) 100 MG TABLET    Take 100 mg by mouth at bedtime.  Modified Medications   No medications on file  Discontinued Medications   SENNA (SENOKOT) 8.6 MG TABS TABLET    Take 1 tablet by mouth daily as needed for mild constipation.    SIGNIFICANT DIAGNOSTIC EXAMS  02-21-13: chest x-ray: minimal cardiomegaly without pulmonary vascular congestion. No pleural effusion. Old granulomatous disease; patchy interstitial pneumonitis at lung bases.   03-02-13: chest x-ray:  no evidence of acute disease: has  pace maker   LABS REVIEWED:   02-15-13: dig 1.4  02-19-13: tsh 6.651pre-albumin 20.54; albumin 2.8; vit b12: 210  03-05-13: wbc 8.6; hgb 8.9; hct 28.3; mcv 99.6; plt 364; glucose 96; bun 46; creat 2.0; k+4.6; na++143; vit b12: 1840 02-27-13: glucose 139; bun 36; creat 1.8; k+4.6; na++ 138 03-08-13: urine culture: e-coli: vantin 03-15-13: wbc 9.8; hgb 10.7; hct 3.8; mcv 98.8; plt 358  03-26-13: wbc 6.5; hgb 9.4; hct 29.6; mcv 96.1;lt 302;  04-10-13: tsh 3.012  06-08-13: hgb a1c 6.7  08-20-13: chol 118; ldl 52; trig 61      Review of Systems  Constitutional: Negative for malaise/fatigue.  Respiratory: Negative for cough and shortness of breath.   Cardiovascular: Negative for chest pain and palpitations.  Gastrointestinal: Negative for abdominal pain and constipation.  Musculoskeletal: Negative for myalgias.  Skin:       Has heel ulcers  Psychiatric/Behavioral: The patient is not nervous/anxious.     Physical Exam  Constitutional: No distress.  Thin   Neck: Neck supple. No JVD present.  Cardiovascular: Normal rate, regular rhythm and intact distal pulses.   Respiratory: Effort normal and breath sounds normal. No respiratory distress.  GI: Soft. Bowel sounds are normal. She exhibits no distension. There is no tenderness.  Musculoskeletal: She exhibits no edema.  Is able to move extremities   Neurological: She is alert.  Skin: Skin is warm and dry. She is not diaphoretic.  Bilateral hell ulcerations without signs of infection present; are resolving.        ASSESSMENT/ PLAN:  1. Bilateral  heel ulceration: will continue her current treatment and plan of care and will monitor her status   2. Afib: her heart rate is stable; is status post pace maker will continue digoxin 0.125 mg daily for rate control and will continue asa 81 mg daily   3. Hypothyroidism: will continue synthroid 25 mcg daily   4. Hypertension: is stable will continue  norvasc 10 mg daily; coreg 6.25 mg twice daily; clonidine patch 0.3 mg weekly and clonidine 0.1 mg three times daily as needed   5. CAD: no recent complaint of chest pain present; will continue asa 81 mg daily; plavix 75 mg daily  and ntg prn will monitor  6. Copd: is stable will continue albuterol 2 puffs every 6 hours as needed; tudorza twice daily and will monitor   7. Chronic diastolic heart failure: is stable will continue demadex 20 mg daily and will monitor  8. Dyslipidemia: will stop her lipitor and will monitor   9. Anemia: will continue iron twice daily  10. Gerd: will continue zantac 150 mg twice daily  11. Depression: is emotionally stable will continue celexa 10 mg daily  12.  Insomnia: is stable will continue trazdodone 100 mg nightly and melatonin 3 mg nightly   13. Constipation: will continue miralax twice daily and colace twice daily   14. Osteoarthritis: is stable will continue  vicodin 5/325 mg every 4 hours as needed  15. Diabetes: will continue humalog 3 units prior to meals for cbg >=150

## 2013-09-13 ENCOUNTER — Encounter: Payer: Self-pay | Admitting: Adult Health

## 2013-09-13 MED ORDER — CITALOPRAM HYDROBROMIDE 10 MG PO TABS: 20.0000 mg | ORAL_TABLET | Freq: Every day | ORAL | Status: DC

## 2013-09-13 MED ORDER — TIOTROPIUM BROMIDE MONOHYDRATE 2.5 MCG/ACT IN AERS: 2.0000 | INHALATION_SPRAY | Freq: Every day | RESPIRATORY_TRACT | Status: DC

## 2013-09-13 NOTE — Progress Notes (Signed)
Patient ID: [name], female   DOB: 04/18/1924, 80 y.o.   MRN: [id number]     [facility]  Allergies  Allergen Reactions  . Naproxen     REACTION: gastritis     Chief Complaint  Patient presents with  . Acute Visit    staff concerns    HPI:  Staff has asked that I see her regarding her tudorza. She is unable to inhale this medication in order to receive this medication. The staff is wondering what it can be changed to in order to better meet her needs.  She is complaining of worsening depression. She is more sad; and is spending more time in bed. She states that she is tearful at times.    Past Medical History  Diagnosis Date  . Coronary artery disease     a. Acute MI 2003, stent to LCx. b. NSTEMI 2008 (cath w/o PCI).  c. +trop 02/2011 felt 2/2 demand ischemia.  [name] History of atrial flutter 2009    a. s/p ablation of typical flutter 09/2007.  [name] Paroxysmal atrial fibrillation     a. Dx 2009. b. Discontinuation of Coumadin 01/2008 2/2 hemarthrosis/chronic debilitation.  [name] COPD (chronic obstructive pulmonary disease)   . Hyperlipidemia   . Diabetes mellitus   . Diastolic congestive heart failure   . Arthritis   . Chronic kidney disease   . Carotid artery disease     a. Bilateral  . SSS (sick sinus syndrome)     a. Symptomatic bradycardia/syncope and post-termination pauses - St. Jude dual chamber PPM 09/2007.   [name] Pneumonia   . Anxiety     adjustment disorder  . Pacemaker   . Mesenteric ischemia     a. Possible dx, 2013.  . Diverticula of colon     a. Colonoscopy 01/2012.  [name] PVD (peripheral vascular disease)     Carotid dz as noted, repoted aortoiliac occlusive dz, also reported high-grade stenosis at the origin & prox innominate artery, high-grade stenosis of the right carotid bifurcation, moderate stenosis of the proximal prevertebral left subclavian artery by CT 02/2011  . Pituitary tumor     s/p gamma knife  . Tachy-brady syndrome   . ?? Subclavian artery  stenosis, right 04/04/2012  . SMA stenosis-moderate 04/04/2012  . Diverticulosis of colon (without mention of hemorrhage) 01/27/2012  . 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 CAROTID BRUIT, RIGHT 04/26/2007    Qualifier: Diagnosis of  By: Maxie Better FNP, Rosalita Levan     Past Surgical History  Procedure Laterality Date  . Cardiac catheterization      2003 cardiac stent  . Insert / replace / remove pacemaker  2009    St. Jude  . Cholecystectomy    . Total knee arthroplasty      bilateral  . Bilateral hip arthroscopy    . 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 Myocardial perfusion study      EF 59% 4/04  . Adenosine myoview study      abn EF 53% 12/04/04  . Carotid doppler  2/99  . Echo with lvh  1/98    old records  . Stress cardiolite  3/04    EF 53%  . Gamma knife radiosurg  08/25/06    [facility]  . Pvd      with stent in the right iliac   .  Colonoscopy  01/27/2012    Procedure: COLONOSCOPY;  Surgeon: Inda Castle, MD;  Location: [place];  Service: Endoscopy;  Laterality: N/A;    VITAL SIGNS BP 136/50  Pulse 68  Ht 5\' 5"  (1.651 m)  Wt 139 lb 12.8 oz (63.413 kg)  BMI 23.26 kg/m2   Patient's Medications  New Prescriptions   No medications on file  Previous Medications   ACLIDINIUM BROMIDE (TUDORZA PRESSAIR) 400 MCG/ACT AEPB    Inhale 1 puff into the lungs 2 (two) times daily.   ALBUTEROL (PROVENTIL HFA;VENTOLIN HFA) 108 (90 BASE) MCG/ACT INHALER    Inhale 2 puffs into the lungs every 6 (six) hours as needed for wheezing or shortness of breath.   AMIODARONE (PACERONE) 200 MG TABLET    Take 1 tablet (200 mg total) by mouth 2 (two) times daily.   AMLODIPINE (NORVASC) 10 MG TABLET    Take 10 mg by mouth daily.   ASPIRIN 81 MG TABLET    Take 81 mg by mouth daily.   CARVEDILOL (COREG) 25 MG TABLET    Take 6.25 mg by mouth 2 (two) times daily  with a meal.   CITALOPRAM (CELEXA) 10 MG TABLET    Take 10 mg by mouth daily.   CLONIDINE (CATAPRES - DOSED IN MG/24 HR) 0.1 MG/24HR PATCH    Place 0.1 mg onto the skin once a week.   CLONIDINE (CATAPRES) 0.2 MG TABLET    Take 0.1 mg by mouth 3 (three) times daily. AS NEEDED FOR S B/P >160   CLOPIDOGREL (PLAVIX) 75 MG TABLET    Take 75 mg by mouth daily with breakfast.   DIGOXIN (LANOXIN) 0.125 MG TABLET    Take 1 tablet (0.125 mg total) by mouth every other day.   DOCUSATE SODIUM (COLACE) 100 MG CAPSULE    Take 100 mg by mouth 2 (two) times daily.    FERROUS SULFATE 325 (65 FE) MG TABLET    Take 325 mg by mouth 2 (two) times daily with a meal.   HYDROCODONE-ACETAMINOPHEN (NORCO/VICODIN) 5-325 MG PER TABLET    Take 1 tablet by mouth every 4 (four) hours as needed for pain.   INSULIN ASPART (NOVOLOG FLEXPEN) 100 UNIT/ML SOPN FLEXPEN    Inject 3 Units into the skin 3 (three) times daily with meals. 3 units ac meals cbg >=150   LEVOTHYROXINE (SYNTHROID, LEVOTHROID) 50 MCG TABLET    Take 25 mcg by mouth daily before breakfast.    MELATONIN 3 MG CAPS    Take 3 mg by mouth at bedtime.   NITROGLYCERIN (NITROSTAT) 0.4 MG SL TABLET    Place 0.4 mg under the tongue every 5 (five) minutes as needed. For chest pain   POLYETHYLENE GLYCOL (MIRALAX / GLYCOLAX) PACKET    Take 17 g by mouth 2 (two) times daily.   RANITIDINE (ZANTAC) 150 MG CAPSULE    Take 150 mg by mouth every morning.   TORSEMIDE (DEMADEX) 20 MG TABLET    Take 20 mg by mouth daily.   TRAZODONE (DESYREL) 100 MG TABLET    Take 100 mg by mouth at bedtime.  Modified Medications   No medications on file  Discontinued Medications   No medications on file    SIGNIFICANT DIAGNOSTIC EXAMS  02-21-13: chest x-ray: minimal cardiomegaly without pulmonary vascular congestion. No pleural effusion. Old granulomatous disease; patchy interstitial pneumonitis at lung bases.   03-02-13: chest x-ray: no evidence of acute disease: has pace maker   LABS  REVIEWED:  02-15-13: dig 1.4  02-19-13: tsh 6.651pre-albumin 20.54; albumin 2.8; vit b12: 210  03-05-13: wbc 8.6; hgb 8.9; hct 28.3; mcv 99.6; plt 364; glucose 96; bun 46; creat 2.0; k+4.6; na++143; vit b12: 1840 02-27-13: glucose 139; bun 36; creat 1.8; k+4.6; na++ 138 03-08-13: urine culture: e-coli: vantin 03-15-13: wbc 9.8; hgb 10.7; hct 3.8; mcv 98.8; plt 358  03-26-13: wbc 6.5; hgb 9.4; hct 29.6; mcv 96.1;lt 302;  04-10-13: tsh 3.012  06-08-13: hgb a1c 6.7  08-20-13: chol 118; ldl 52; trig 61      Review of Systems  Constitutional: Negative for malaise/fatigue.  Respiratory: Negative for cough and shortness of breath.   Cardiovascular: Negative for chest pain and palpitations.  Gastrointestinal: Negative for abdominal pain and constipation.  Musculoskeletal: Negative for myalgias.  Skin:       Has heel ulcers  Psychiatric/Behavioral: The patient is depressed    Physical Exam  Constitutional: No distress.  Thin   Neck: Neck supple. No JVD present.  Cardiovascular: Normal rate, regular rhythm and intact distal pulses.   Respiratory: Effort normal and breath sounds normal. No respiratory distress.  GI: Soft. Bowel sounds are normal. She exhibits no distension. There is no tenderness.  Musculoskeletal: She exhibits no edema.  Is able to move extremities   Neurological: She is alert.  Skin: Skin is warm and dry. She is not diaphoretic.  Bilateral hell ulcerations without signs of infection present; are resolving.        ASSESSMENT/ PLAN:  1. Copd: is stable will continue albuterol 2 puffs every 6 hours as needed; since she is unable to inhale the tudorza; will stop this medication and will being spiriva respimet with a aerochamber and will continue to monitor her status.    2. . Depression: is worse; will increase her celexa to 20 mg daily and will continue to monitor her status.

## 2013-09-26 IMAGING — CT CT HEAD WITHOUT CONTRAST
1 series · 16 of 29 positions shown, 20 images · non-contrast
Comparison: none

REASON FOR EXAM: headache with hx "brain tumor" resected 10+ yrs ago
COMMENTS:

PROCEDURE:     CT  - CT HEAD WITHOUT CONTRAST  - June 08, 2012  [DATE]
RESULT:     History: Headache.
Comparison Study: No prior.

[Series 2: soft tissue · axial · 0.42mm/px · z∈[-122,+8]mm · 16 of 29 slices shown, 20 images]
[im 2/29  brain]
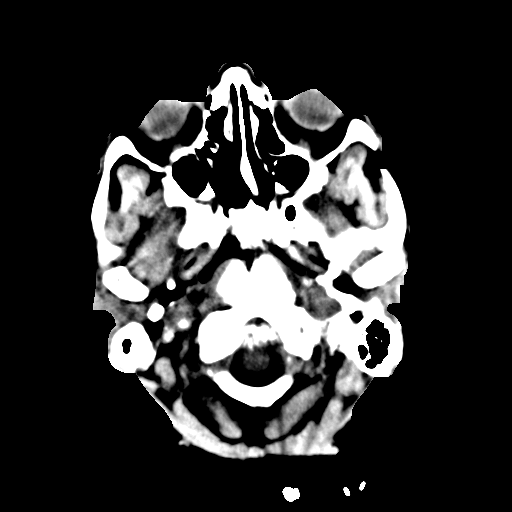
[im 2/29  bone]
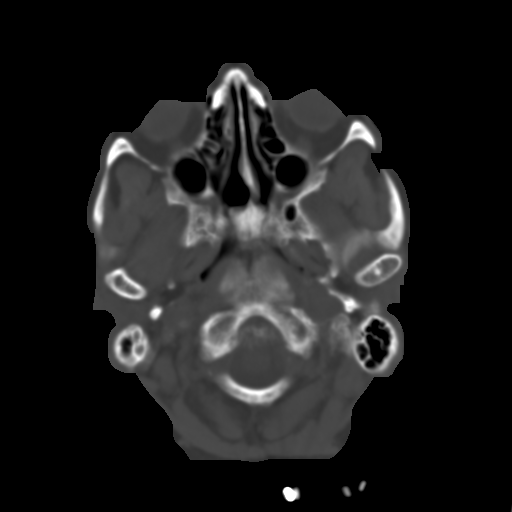
[im 4/29  brain]
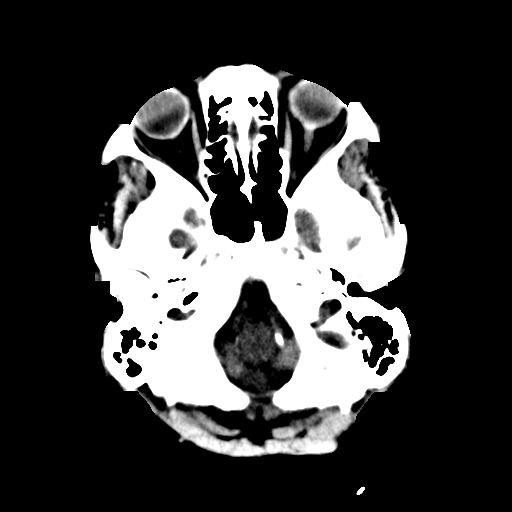
[im 6/29  brain]
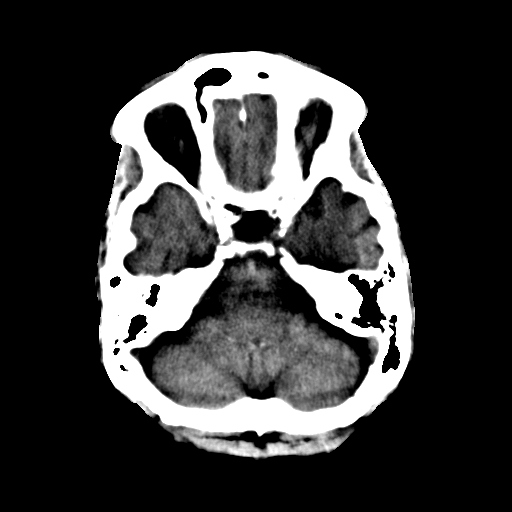
[im 7/29  brain]
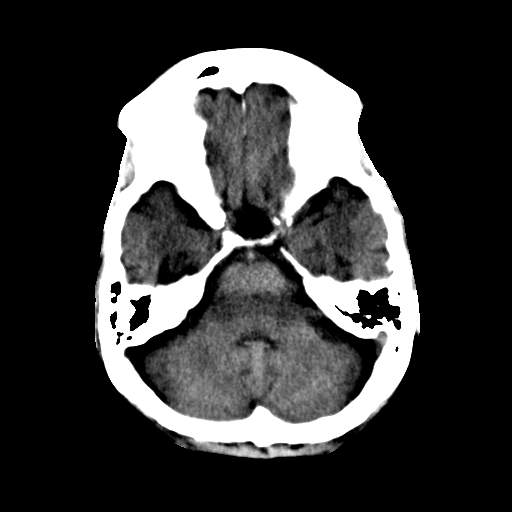
[im 9/29  brain]
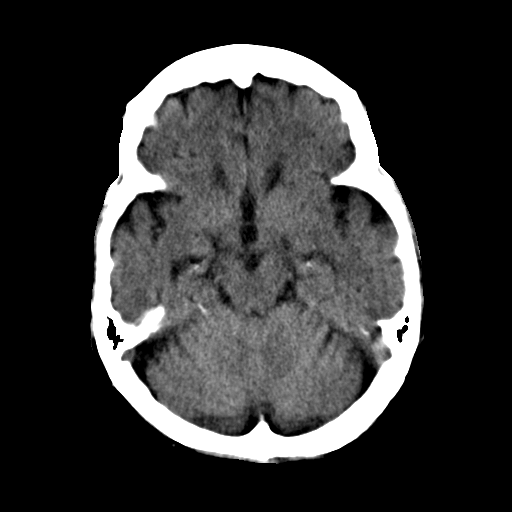
[im 9/29  bone]
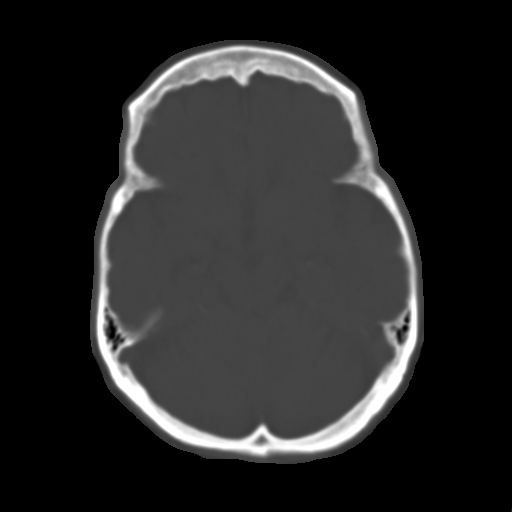
[im 11/29  brain]
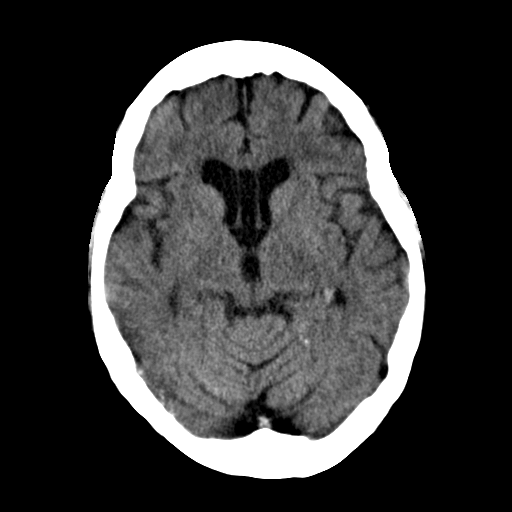
[im 12/29  brain]
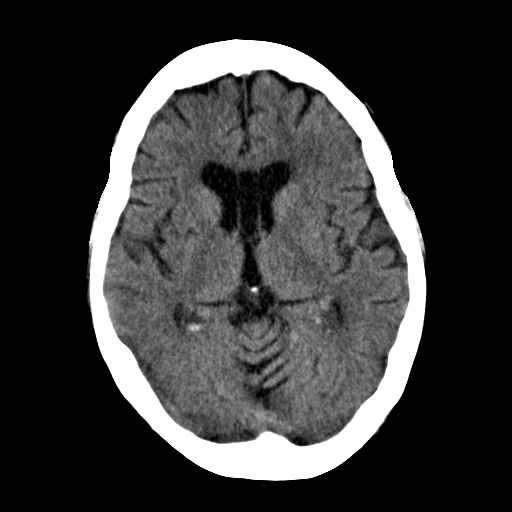
[im 14/29  brain]
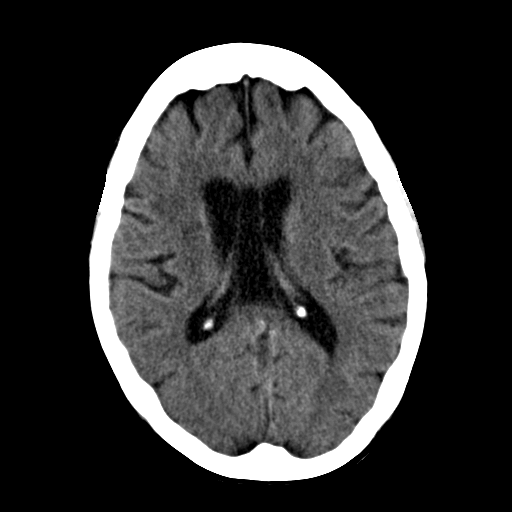
[im 16/29  brain]
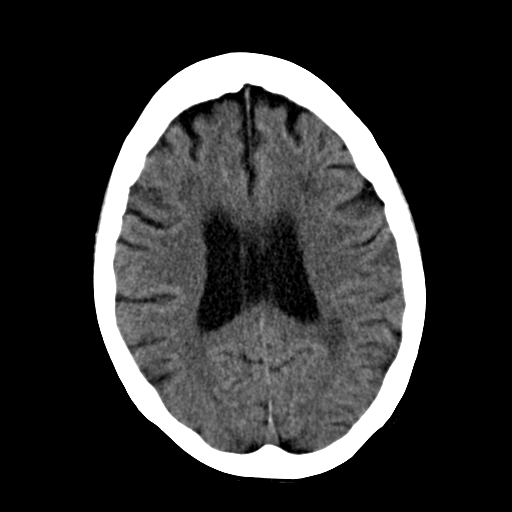
[im 16/29  bone]
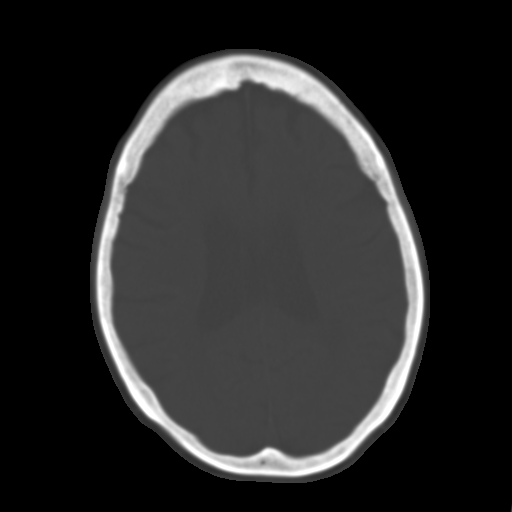
[im 18/29  brain]
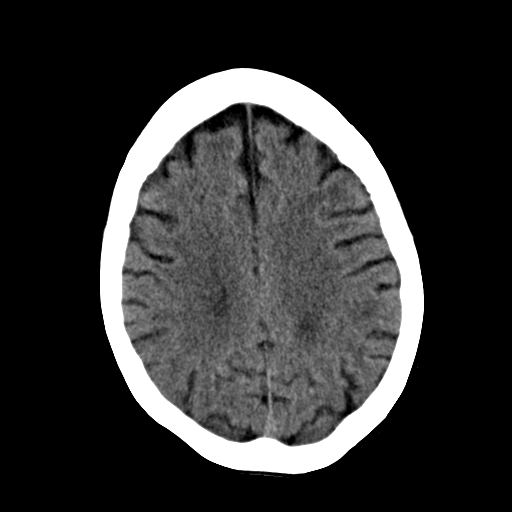
[im 19/29  brain]
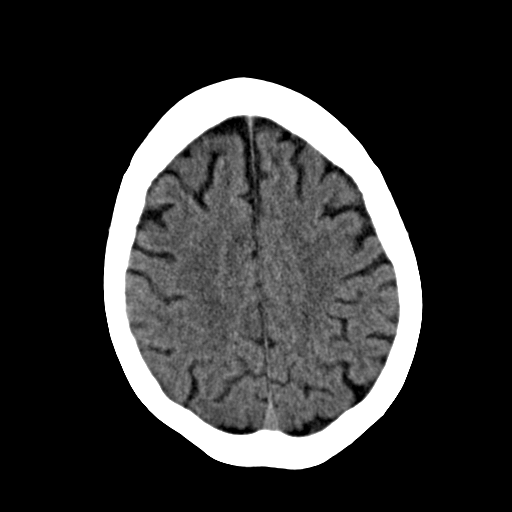
[im 21/29  brain]
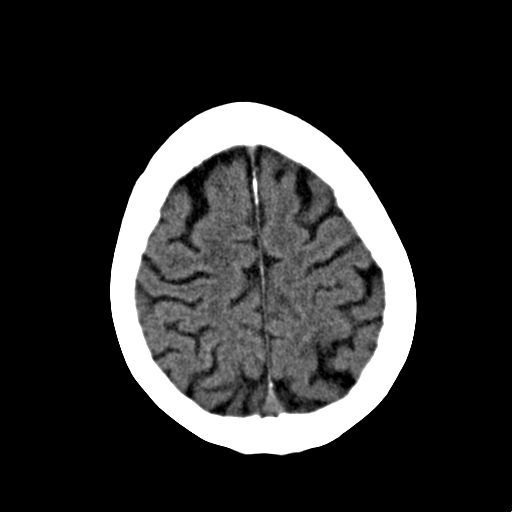
[im 23/29  brain]
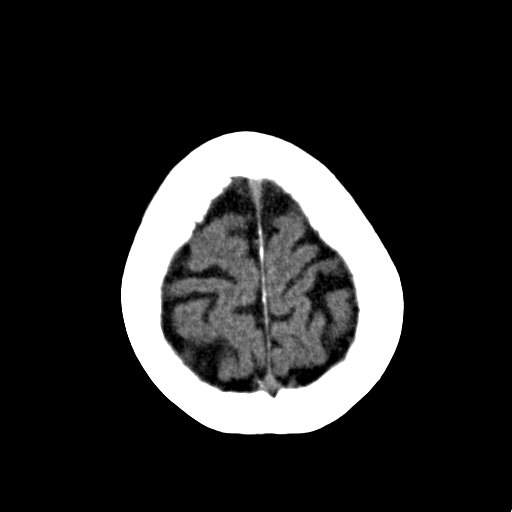
[im 23/29  bone]
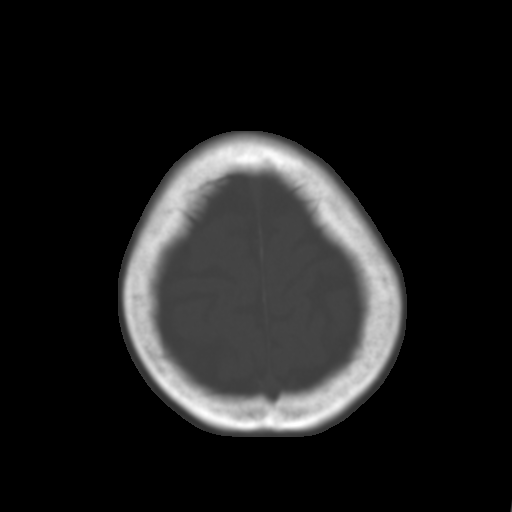
[im 24/29  brain]
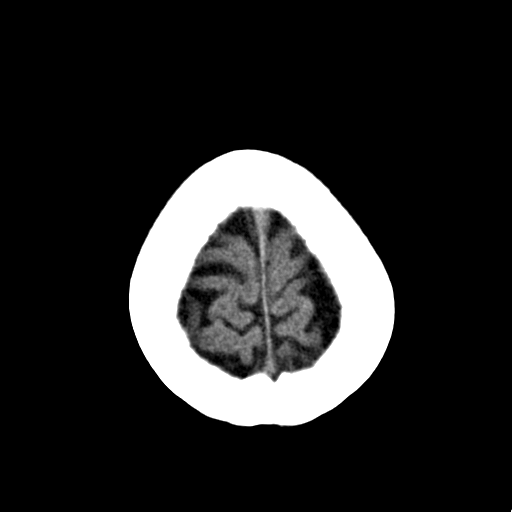
[im 26/29  brain]
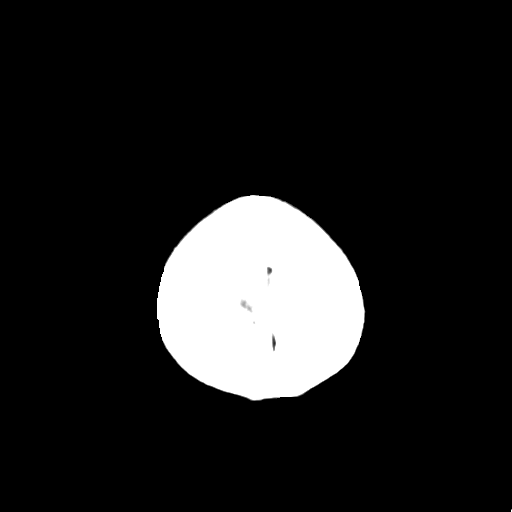
[im 28/29  brain]
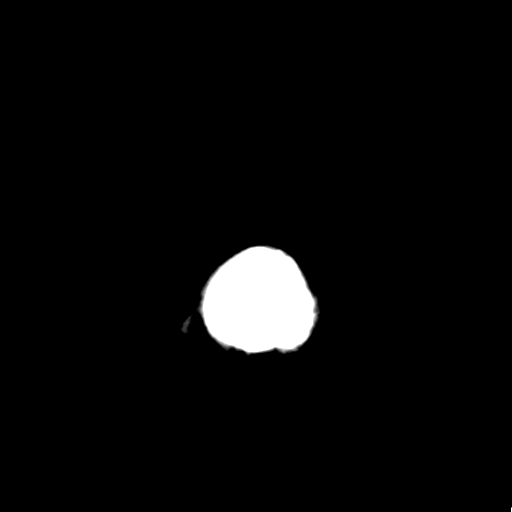

[16 of 29 positions shown; findings below may reference images not displayed]

FINDINGS: Standard nonenhanced CT obtained. White matter changes consistent
with chronic ischemia. No acute bony abnormality.
IMPRESSION: White matter changes noted consistent with chronic
ischemia. No mass lesion. No hemorrhage.

## 2013-09-28 ENCOUNTER — Non-Acute Institutional Stay (SKILLED_NURSING_FACILITY): Payer: Medicare Other | Admitting: Adult Health

## 2013-09-28 DIAGNOSIS — I4891 Unspecified atrial fibrillation: Secondary | ICD-10-CM

## 2013-09-28 DIAGNOSIS — I5030 Unspecified diastolic (congestive) heart failure: Secondary | ICD-10-CM

## 2013-09-28 DIAGNOSIS — E785 Hyperlipidemia, unspecified: Secondary | ICD-10-CM

## 2013-09-28 DIAGNOSIS — E039 Hypothyroidism, unspecified: Secondary | ICD-10-CM

## 2013-09-28 DIAGNOSIS — I509 Heart failure, unspecified: Secondary | ICD-10-CM

## 2013-09-28 DIAGNOSIS — K5900 Constipation, unspecified: Secondary | ICD-10-CM

## 2013-09-28 DIAGNOSIS — F32A Depression, unspecified: Secondary | ICD-10-CM

## 2013-09-28 DIAGNOSIS — N183 Chronic kidney disease, stage 3 unspecified: Secondary | ICD-10-CM

## 2013-09-28 DIAGNOSIS — I2510 Atherosclerotic heart disease of native coronary artery without angina pectoris: Secondary | ICD-10-CM

## 2013-09-28 DIAGNOSIS — G4700 Insomnia, unspecified: Secondary | ICD-10-CM

## 2013-09-28 DIAGNOSIS — E1159 Type 2 diabetes mellitus with other circulatory complications: Secondary | ICD-10-CM

## 2013-09-28 DIAGNOSIS — Z950 Presence of cardiac pacemaker: Secondary | ICD-10-CM

## 2013-09-28 DIAGNOSIS — K219 Gastro-esophageal reflux disease without esophagitis: Secondary | ICD-10-CM

## 2013-09-28 DIAGNOSIS — F329 Major depressive disorder, single episode, unspecified: Secondary | ICD-10-CM

## 2013-09-28 DIAGNOSIS — J449 Chronic obstructive pulmonary disease, unspecified: Secondary | ICD-10-CM

## 2013-09-28 DIAGNOSIS — F3289 Other specified depressive episodes: Secondary | ICD-10-CM

## 2013-10-07 ENCOUNTER — Encounter: Payer: Self-pay | Admitting: Adult Health

## 2013-10-07 NOTE — Progress Notes (Signed)
Patient ID: [name], female   DOB: June 10, 1923, 80 y.o.   MRN: [id number]    [facility]  Allergies  Allergen Reactions  . Naproxen     REACTION: gastritis     Chief Complaint  Patient presents with  . Annual Exam    HPI:  She is being seen for her annual exam. She has been without change in the past several months. There are no concerns being voiced by the nursing staff at this time. She is not voicing any concerns at this time. Her health maintenance is up to date.   Past Medical History  Diagnosis Date  . Coronary artery disease     a. Acute MI 2003, stent to LCx. b. NSTEMI 2008 (cath w/o PCI).  c. +trop 02/2011 felt 2/2 demand ischemia.  [name] History of atrial flutter 2009    a. s/p ablation of typical flutter 09/2007.  [name] Paroxysmal atrial fibrillation     a. Dx 2009. b. Discontinuation of Coumadin 01/2008 2/2 hemarthrosis/chronic debilitation.  [name] COPD (chronic obstructive pulmonary disease)   . Hyperlipidemia   . Diabetes mellitus   . Diastolic congestive heart failure   . Arthritis   . Chronic kidney disease   . Carotid artery disease     a. Bilateral  . SSS (sick sinus syndrome)     a. Symptomatic bradycardia/syncope and post-termination pauses - St. Jude dual chamber PPM 09/2007.   [name] Pneumonia   . Anxiety     adjustment disorder  . Pacemaker   . Mesenteric ischemia     a. Possible dx, 2013.  . Diverticula of colon     a. Colonoscopy 01/2012.  [name] PVD (peripheral vascular disease)     Carotid dz as noted, repoted aortoiliac occlusive dz, also reported high-grade stenosis at the origin & prox innominate artery, high-grade stenosis of the right carotid bifurcation, moderate stenosis of the proximal prevertebral left subclavian artery by CT 02/2011  . Pituitary tumor     s/p gamma knife  . Tachy-brady syndrome   . ?? Subclavian artery stenosis, right 04/04/2012  . SMA stenosis-moderate 04/04/2012  . Diverticulosis of colon (without mention of hemorrhage)  01/27/2012  . 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 CAROTID BRUIT, RIGHT 04/26/2007    Qualifier: Diagnosis of  By: Maxie Better FNP, Rosalita Levan   . Cardiac pacemaker in situ 09/15/2009    Qualifier: Diagnosis of  By: Lovena Le, MD, [facility], Binnie Kand     Past Surgical History  Procedure Laterality Date  . Cardiac catheterization      2003 cardiac stent  . Insert / replace / remove pacemaker  2009    [employer] Jude  . Cholecystectomy    . Total knee arthroplasty      bilateral  . Bilateral hip arthroscopy    . 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 Myocardial perfusion study      EF 59% 4/04  . Adenosine myoview study      abn EF 53% 12/04/04  . Carotid doppler  2/99  . Echo with lvh  1/98    old records  . Stress cardiolite  3/04    EF 53%  . Gamma knife radiosurg  08/25/06    [facility]  . Pvd      with stent in the right iliac   . Colonoscopy  01/27/2012  Procedure: COLONOSCOPY;  Surgeon: Inda Castle, MD;  Location: [place];  Service: Endoscopy;  Laterality: N/A;   History   Social History  . Marital Status: Widowed    Spouse Name: N/A    Number of Children: N/A  . Years of Education: N/A   Occupational History  . Not on file.   Social History Main Topics  . Smoking status: Current Every Day Smoker -- 0.25 packs/day for 40 years    Types: Cigarettes    Last Attempt to Quit: 04/02/2012  . Smokeless tobacco: Current User    Types: Snuff  . Alcohol Use: No  . Drug Use: No  . Sexual Activity: Not on file   Other Topics Concern  . Not on file   Social History Narrative   Widowed, husband died at age 69-DM, HTN. 3 children.    Nursing assistant in past, active in church.    Son lives with her and does most of the housework and cooking. 1/10   Pt signed party release form and gives Danise Edge, granddaughter [phone]), access to  medical records. Can leave msg on machine [phone]) or cell.     Family History  Problem Relation Age of Onset  . Cancer Neg Hx   . Depression      ?  . Diabetes      DM and HBP- family hx     CONSULTS nceps   VITAL SIGNS BP 137/56  Pulse 63  Ht 5\' 5"  (1.651 m)  Wt 143 lb 3.2 oz (64.955 kg)  BMI 23.83 kg/m2   Patient's Medications  New Prescriptions   No medications on file  Previous Medications   ALBUTEROL (PROVENTIL HFA;VENTOLIN HFA) 108 (90 BASE) MCG/ACT INHALER    Inhale 2 puffs into the lungs every 6 (six) hours as needed for wheezing or shortness of breath.   AMIODARONE (PACERONE) 200 MG TABLET    Take 1 tablet (200 mg total) by mouth 2 (two) times daily.   AMLODIPINE (NORVASC) 10 MG TABLET    Take 5 mg by mouth daily.    ASPIRIN 81 MG TABLET    Take 81 mg by mouth daily.   CARVEDILOL (COREG) 25 MG TABLET    Take 6.25 mg by mouth 2 (two) times daily with a meal.   CITALOPRAM (CELEXA) 10 MG TABLET    Take 2 tablets (20 mg total) by mouth daily.   CLONIDINE (CATAPRES - DOSED IN MG/24 HR) 0.1 MG/24HR PATCH    Place 0.1 mg onto the skin once a week.   CLONIDINE (CATAPRES) 0.2 MG TABLET    Take 0.1 mg by mouth 3 (three) times daily. AS NEEDED FOR S B/P >160   CLOPIDOGREL (PLAVIX) 75 MG TABLET    Take 75 mg by mouth daily with breakfast.   DIGOXIN (LANOXIN) 0.125 MG TABLET    Take 1 tablet (0.125 mg total) by mouth every other day.   DOCUSATE SODIUM (COLACE) 100 MG CAPSULE    Take 100 mg by mouth 2 (two) times daily.    FERROUS SULFATE 325 (65 FE) MG TABLET    Take 325 mg by mouth 2 (two) times daily with a meal.   HYDROCODONE-ACETAMINOPHEN (NORCO/VICODIN) 5-325 MG PER TABLET    Take 1 tablet by mouth every 4 (four) hours as needed for pain.   INSULIN LISPRO (HUMALOG) 100 UNIT/ML INJECTION    Inject 3 Units into the skin 3 (three) times daily before meals. For cbg >=150   LEVOTHYROXINE (SYNTHROID,  LEVOTHROID) 50 MCG TABLET    Take 25 mcg by mouth daily before breakfast.      MELATONIN 3 MG CAPS    Take 3 mg by mouth at bedtime.   NITROGLYCERIN (NITROSTAT) 0.4 MG SL TABLET    Place 0.4 mg under the tongue every 5 (five) minutes as needed. For chest pain   POLYETHYLENE GLYCOL (MIRALAX / GLYCOLAX) PACKET    Take 17 g by mouth 2 (two) times daily.   RANITIDINE (ZANTAC) 150 MG CAPSULE    Take 150 mg by mouth every morning.   SENNA (SENOKOT) 8.6 MG TABS TABLET    Take 1 tablet by mouth at bedtime.   TIOTROPIUM BROMIDE MONOHYDRATE (SPIRIVA RESPIMAT) 2.5 MCG/ACT AERS    Inhale 2 Inhalers into the lungs daily. Use with aerochamber   TORSEMIDE (DEMADEX) 20 MG TABLET    Take 20 mg by mouth daily.   TRAZODONE (DESYREL) 100 MG TABLET    Take 100 mg by mouth at bedtime.  Modified Medications   No medications on file  Discontinued Medications   INSULIN ASPART (NOVOLOG FLEXPEN) 100 UNIT/ML SOPN FLEXPEN    Inject 3 Units into the skin 3 (three) times daily with meals. 3 units ac meals cbg >=150    SIGNIFICANT DIAGNOSTIC EXAMS  02-21-13: chest x-ray: minimal cardiomegaly without pulmonary vascular congestion. No pleural effusion. Old granulomatous disease; patchy interstitial pneumonitis at lung bases.   03-02-13: chest x-ray: no evidence of acute disease: has pace maker   LABS REVIEWED:   02-15-13: dig 1.4  02-19-13: tsh 6.651pre-albumin 20.54; albumin 2.8; vit b12: 210  03-05-13: wbc 8.6; hgb 8.9; hct 28.3; mcv 99.6; plt 364; glucose 96; bun 46; creat 2.0; k+4.6; na++143; vit b12: 1840 02-27-13: glucose 139; bun 36; creat 1.8; k+4.6; na++ 138 03-08-13: urine culture: e-coli: vantin 03-15-13: wbc 9.8; hgb 10.7; hct 3.8; mcv 98.8; plt 358  03-26-13: wbc 6.5; hgb 9.4; hct 29.6; mcv 96.1;lt 302;  04-10-13: tsh 3.012  06-08-13: hgb a1c 6.7  08-20-13: chol 118; ldl 52; trig 61      Review of Systems  Constitutional: Negative for malaise/fatigue.  Respiratory: Negative for cough and shortness of breath.   Cardiovascular: Negative for chest pain and palpitations.   Gastrointestinal: Negative for abdominal pain and constipation.  Musculoskeletal: Negative for myalgias.  Skin:       Has heel ulcers  Psychiatric/Behavioral: The patient is not nervous/anxious.     Physical Exam  Constitutional: No distress.  Thin   Neck: Neck supple. No JVD present.  Cardiovascular: Normal rate, regular rhythm and intact distal pulses.   Respiratory: Effort normal and breath sounds normal. No respiratory distress.  GI: Soft. Bowel sounds are normal. She exhibits no distension. There is no tenderness.  Musculoskeletal: She exhibits no edema.  Is able to move extremities   Neurological: She is alert.  Skin: Skin is warm and dry. She is not diaphoretic.  Left heel ulceration without signs of infection present.        ASSESSMENT/ PLAN:  1. Left  heel ulceration: will continue her current treatment and plan of care and will monitor her status   2. Afib: her heart rate is stable; is status post pace maker will continue digoxin 0.125 mg every other day amiodarone 200 mg twice daily  for rate control and will continue asa 81 mg daily   3. Hypothyroidism: will continue synthroid 25 mcg daily   4. Hypertension: is stable will continue norvasc 5 mg daily; coreg 6.25  mg twice daily; clonidine patch 0.1 mg weekly and clonidine 0.1 mg three times daily as needed   5. CAD: no recent complaint of chest pain present; will continue asa 81 mg daily; plavix 75 mg daily  and ntg prn will monitor  6. Copd: is stable will continue albuterol 2 puffs every 6 hours as needed; spiriva respimat 2 puffs daily and will monitor  7. Chronic diastolic heart failure: is stable will continue demadex 20 mg daily and will monitor  8. Dyslipidemia: is presently not on medications will monitor her status.    9. Anemia: will continue iron twice daily  10. Gerd: will continue zantac 150 mg nightly   11. Depression: is emotionally stable will continue celexa 20 mg daily  12.  Insomnia: is  stable will continue trazdodone 100 mg nightly and melatonin 3 mg nightly   13. Constipation: will continue miralax twice daily and colace twice daily   14. Osteoarthritis: is stable will continue  vicodin 5/325 mg every 4 hours as needed  15. Diabetes: will continue humalog 3 units prior to meals for cbg >=150     Her health maintenance is up to date   Will check cbc; cmp; tsh; dig level         Ok Edwards NP [facility]  Contact [phone] Monday through Friday 8am- 5pm  After hours call [phone]

## 2013-10-22 ENCOUNTER — Non-Acute Institutional Stay (SKILLED_NURSING_FACILITY): Payer: Medicare Other | Admitting: Internal Medicine

## 2013-10-22 ENCOUNTER — Encounter: Payer: Self-pay | Admitting: Internal Medicine

## 2013-10-22 DIAGNOSIS — L8990 Pressure ulcer of unspecified site, unspecified stage: Secondary | ICD-10-CM

## 2013-10-22 DIAGNOSIS — I119 Hypertensive heart disease without heart failure: Secondary | ICD-10-CM

## 2013-10-22 DIAGNOSIS — E039 Hypothyroidism, unspecified: Secondary | ICD-10-CM

## 2013-10-22 DIAGNOSIS — I4891 Unspecified atrial fibrillation: Secondary | ICD-10-CM | POA: Insufficient documentation

## 2013-10-22 DIAGNOSIS — J209 Acute bronchitis, unspecified: Secondary | ICD-10-CM

## 2013-10-22 DIAGNOSIS — L89609 Pressure ulcer of unspecified heel, unspecified stage: Secondary | ICD-10-CM

## 2013-10-22 DIAGNOSIS — E119 Type 2 diabetes mellitus without complications: Secondary | ICD-10-CM | POA: Insufficient documentation

## 2013-10-22 NOTE — Progress Notes (Signed)
Patient ID: [name], female   DOB: [birth year], 80 y.o.   MRN: [id number]    Facility: [facility]   Allergies  Allergen Reactions  . Naproxen     REACTION: gastritis   Chief Complaint  Patient presents with  . Medical Management of Chronic Issues    RV   Code- full code  HPI 80 y/o female patient is seen today for routine follow up. She has been having productive cough with runny nose for 2 days. Her chest feels stuffed up and trying to cough makes her have some chest discomfort but feels nothing much comes out. Denies fever or chills. appetite has decreased. Sleeping ok at night. Denies orthopnea. Mood has been fine  Review of Systems  Constitutional: Negative for fever, chills. Feels tired todat HENT: Negative for hearing loss and sore throat.   Eyes: Negative for blurred vision, double vision and discharge.  Respiratory: Negative for shortness of breath and wheezing.   Cardiovascular: Negative for chest pain, palpitations, orthopnea and leg swelling.  Gastrointestinal: Negative for heartburn, nausea, vomiting, abdominal pain, constipation.  Genitourinary: Negative for dysuria, urgency, frequency and flank pain.  Musculoskeletal: Negative for back pain and falls. uses a wheelchair Skin: Negative for itching and rash.  Neurological: alert and oriented.  Psychiatric/Behavioral: Negative for depression and memory loss. The patient is not nervous/anxious.    Past Medical History  Diagnosis Date  . Coronary artery disease     a. Acute MI 2003, stent to LCx. b. NSTEMI 2008 (cath w/o PCI).  c. +trop 02/2011 felt 2/2 demand ischemia.  [name] History of atrial flutter 2009    a. s/p ablation of typical flutter 09/2007.  [name] Paroxysmal atrial fibrillation     a. Dx 2009. b. Discontinuation of Coumadin 01/2008 2/2 hemarthrosis/chronic debilitation.  [name] COPD (chronic obstructive pulmonary disease)   . Hyperlipidemia   . Diabetes mellitus   . Diastolic  congestive heart failure   . Arthritis   . Chronic kidney disease   . Carotid artery disease     a. Bilateral  . SSS (sick sinus syndrome)     a. Symptomatic bradycardia/syncope and post-termination pauses - St. Jude dual chamber PPM 09/2007.   [name] Pneumonia   . Anxiety     adjustment disorder  . Pacemaker   . Mesenteric ischemia     a. Possible dx, 2013.  . Diverticula of colon     a. Colonoscopy 01/2012.  [name] PVD (peripheral vascular disease)     Carotid dz as noted, repoted aortoiliac occlusive dz, also reported high-grade stenosis at the origin & prox innominate artery, high-grade stenosis of the right carotid bifurcation, moderate stenosis of the proximal prevertebral left subclavian artery by CT 02/2011  . Pituitary tumor     s/p gamma knife  . Tachy-brady syndrome   . ?? Subclavian artery stenosis, right 04/04/2012  . SMA stenosis-moderate 04/04/2012  . Diverticulosis of colon (without mention of hemorrhage) 01/27/2012  . 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 CAROTID BRUIT, RIGHT 04/26/2007    Qualifier: Diagnosis of  By: Maxie Better FNP, Rosalita Levan   . Cardiac pacemaker in situ 09/15/2009    Qualifier: Diagnosis of  By: Lovena Le, MD, Martyn Malay    Medication reviewed. See [facility]  Physical exam BP 150/76  Pulse 64  Temp(Src) 98 F (36.7 C)  Resp 18  SpO2 95%  General-  elderly female in no acute distress Head- atraumatic, normocephalic Eyes- PERRLA, EOMI, no pallor, no icterus Neck- no lymphadenopathy, no thyromegaly, no jugular vein distension Cardiovascular- normal s1,s2, no murmurs/ rubs/ gallops Respiratory- has rhonchi bilaterally with poor inspiration Abdomen- bowel sounds present, soft, non tender Musculoskeletal- able to move all 4 extremities, no edema, on wheelchair Neurological- no focal deficit, alert and oriented Skin- warm and dry Psychiatry- normal mood and  affect   Labs 02-27-13: glucose 139; bun 36; creat 1.8; k+4.6; na++ 138 03-08-13: urine culture: e-coli: vantin 03-15-13: wbc 9.8; hgb 10.7; hct 3.8; mcv 98.8; plt 358   03-26-13: wbc 6.5; hgb 9.4; hct 29.6; mcv 96.1;lt 302;   04-10-13: tsh 3.012   06-08-13: hgb a1c 6.7   08-20-13: chol 118; ldl 52; trig 61   Assessment/plan  Acute bronchitis Has hx of copd and now new onset cough with increased sputum production. Will start her on zpack and continue her bronchodilator. Also add cough expectorant. Will start this empirically to prevent copd exacerbation   Heel ulcer Improved. Continue cleaning with normal saline and apply hydrogel with collagen and foam dressing every other day for now. Pressure ulcer prophylaxis  Hypothyroidism Check tsh next lab. Continue levothyroxine 58mcg daily  Hypertensive heart disease Remains chest pain free. Continue aspirin with norvasc 5 mg daily, coreg 6.25 mg twice daily, clonidine patch 0.1 mg weekly and clonidine 0.1 mg three times daily as needed for elevated SBP, monitor clinically   Afib Continue coreg 6.25 mg bid for rate control with her pacemaker. Continue her digoxin 0.125 mg every other day and amiodarone 200 mg twice daily  for maintenance. Check thyroid level. Continue baby aspirin. Will get ekg to rule out qtc prolongation  Diabetes a1c in jan 6.7. With her age, poor po intake and other clinical co-morbidities, will d/c humalog for now and recheck a1c. If a1c is > 7.5, consider insulin coverage. Check cbg twice a week for now until a1c is resulted  Labs- lft, a1c, tsh, EKG

## 2013-10-24 LAB — TSH: TSH: 3.45 u[IU]/mL (ref 0.41–5.90)

## 2013-10-24 LAB — HEMOGLOBIN A1C
HEMOGLOBIN A1C: 6 % (ref 4.0–6.0)
Hgb A1c MFr Bld: 6 % (ref 4.0–6.0)

## 2013-10-24 LAB — HEPATIC FUNCTION PANEL
ALT: 20 U/L (ref 7–35)
AST: 25 U/L (ref 13–35)
Alkaline Phosphatase: 51 U/L (ref 25–125)
BILIRUBIN, TOTAL: 0.3 mg/dL

## 2013-10-30 ENCOUNTER — Other Ambulatory Visit: Payer: Self-pay | Admitting: *Deleted

## 2013-10-30 MED ORDER — HYDROCODONE-ACETAMINOPHEN 5-325 MG PO TABS: ORAL_TABLET | ORAL | Status: DC

## 2013-10-30 NOTE — Telephone Encounter (Signed)
[facility] 

## 2013-11-14 ENCOUNTER — Non-Acute Institutional Stay (SKILLED_NURSING_FACILITY): Payer: Medicare Other | Admitting: Adult Health

## 2013-11-14 DIAGNOSIS — I110 Hypertensive heart disease with heart failure: Secondary | ICD-10-CM

## 2013-11-14 DIAGNOSIS — G4700 Insomnia, unspecified: Secondary | ICD-10-CM

## 2013-11-14 DIAGNOSIS — L89609 Pressure ulcer of unspecified heel, unspecified stage: Secondary | ICD-10-CM

## 2013-11-14 DIAGNOSIS — J449 Chronic obstructive pulmonary disease, unspecified: Secondary | ICD-10-CM

## 2013-11-14 DIAGNOSIS — I509 Heart failure, unspecified: Secondary | ICD-10-CM

## 2013-11-14 DIAGNOSIS — M1990 Unspecified osteoarthritis, unspecified site: Secondary | ICD-10-CM

## 2013-11-14 DIAGNOSIS — E039 Hypothyroidism, unspecified: Secondary | ICD-10-CM

## 2013-11-14 DIAGNOSIS — I5032 Chronic diastolic (congestive) heart failure: Secondary | ICD-10-CM

## 2013-11-14 DIAGNOSIS — I10 Essential (primary) hypertension: Secondary | ICD-10-CM

## 2013-11-14 DIAGNOSIS — L89622 Pressure ulcer of left heel, stage 2: Secondary | ICD-10-CM

## 2013-11-14 DIAGNOSIS — E119 Type 2 diabetes mellitus without complications: Secondary | ICD-10-CM

## 2013-11-14 DIAGNOSIS — K219 Gastro-esophageal reflux disease without esophagitis: Secondary | ICD-10-CM

## 2013-11-14 DIAGNOSIS — I4819 Other persistent atrial fibrillation: Secondary | ICD-10-CM

## 2013-11-14 DIAGNOSIS — L8992 Pressure ulcer of unspecified site, stage 2: Secondary | ICD-10-CM

## 2013-11-14 DIAGNOSIS — E785 Hyperlipidemia, unspecified: Secondary | ICD-10-CM

## 2013-11-14 DIAGNOSIS — I4891 Unspecified atrial fibrillation: Secondary | ICD-10-CM

## 2013-11-15 LAB — BASIC METABOLIC PANEL
BUN: 37 mg/dL — AB (ref 4–21)
Creatinine: 2 mg/dL — AB (ref 0.5–1.1)
Glucose: 71 mg/dL
POTASSIUM: 4.4 mmol/L (ref 3.4–5.3)
Sodium: 135 mmol/L — AB (ref 137–147)

## 2013-11-15 LAB — CBC AND DIFFERENTIAL
HCT: 35 % — AB (ref 36–46)
HEMOGLOBIN: 11 g/dL — AB (ref 12.0–16.0)
Platelets: 397 10*3/uL (ref 150–399)
WBC: 5.7 10^3/mL

## 2013-11-20 ENCOUNTER — Encounter: Payer: Self-pay | Admitting: Adult Health

## 2013-11-20 NOTE — Progress Notes (Signed)
Patient ID: [name], female   DOB: 28-Oct-1923, 80 y.o.   MRN: [id number]    [name]  Allergies  Allergen Reactions  . Naproxen     REACTION: gastritis     Chief Complaint  Patient presents with  . Medical Management of Chronic Issues    HPI:  She is being seen for the management of her chronic illnesses. Overall her status is stable. There are no concerns being voiced by the nursing staff at this time. She is not voicing any complaints at this time. Her heel ulceration is resolving.    Past Medical History  Diagnosis Date  . Coronary artery disease     a. Acute MI 2003, stent to LCx. b. NSTEMI 2008 (cath w/o PCI).  c. +trop 02/2011 felt 2/2 demand ischemia.  [name] History of atrial flutter 2009    a. s/p ablation of typical flutter 09/2007.  [name] Paroxysmal atrial fibrillation     a. Dx 2009. b. Discontinuation of Coumadin 01/2008 2/2 hemarthrosis/chronic debilitation.  [name] COPD (chronic obstructive pulmonary disease)   . Hyperlipidemia   . Diabetes mellitus   . Diastolic congestive heart failure   . Arthritis   . Chronic kidney disease   . Carotid artery disease     a. Bilateral  . SSS (sick sinus syndrome)     a. Symptomatic bradycardia/syncope and post-termination pauses - St. Jude dual chamber PPM 09/2007.   [name] Pneumonia   . Anxiety     adjustment disorder  . Pacemaker   . Mesenteric ischemia     a. Possible dx, 2013.  . Diverticula of colon     a. Colonoscopy 01/2012.  [name] PVD (peripheral vascular disease)     Carotid dz as noted, repoted aortoiliac occlusive dz, also reported high-grade stenosis at the origin & prox innominate artery, high-grade stenosis of the right carotid bifurcation, moderate stenosis of the proximal prevertebral left subclavian artery by CT 02/2011  . Pituitary tumor     s/p gamma knife  . Tachy-brady syndrome   . ?? Subclavian artery stenosis, right 04/04/2012  . SMA stenosis-moderate 04/04/2012  . Diverticulosis of colon (without  mention of hemorrhage) 01/27/2012  . 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 CAROTID BRUIT, RIGHT 04/26/2007    Qualifier: Diagnosis of  By: Maxie Better FNP, Rosalita Levan   . Cardiac pacemaker in situ 09/15/2009    Qualifier: Diagnosis of  By: Virginia Le, MD, [facility], [name]     Past Surgical History  Procedure Laterality Date  . Cardiac catheterization      2003 cardiac stent  . Insert / replace / remove pacemaker  2009    St. Jude  . Cholecystectomy    . Total knee arthroplasty      bilateral  . Bilateral hip arthroscopy    . 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 Myocardial perfusion study      EF 59% 4/04  . Adenosine myoview study      abn EF 53% 12/04/04  . Carotid doppler  2/99  . Echo with lvh  1/98    old records  . Stress cardiolite  3/04    EF 53%  . Gamma knife radiosurg  08/25/06    [facility]  . Pvd      with stent in the right iliac   . Colonoscopy  01/27/2012  Procedure: COLONOSCOPY;  Surgeon: Virginia Castle, MD;  Location: [place];  Service: Endoscopy;  Laterality: N/A;    VITAL SIGNS BP 142/68  Pulse 79  Ht 5\' 5"  (1.651 m)  Wt 153 lb 3.2 oz (69.491 kg)  BMI 25.49 kg/m2   Patient's Medications  New Prescriptions   No medications on file  Previous Medications   ALBUTEROL (PROVENTIL HFA;VENTOLIN HFA) 108 (90 BASE) MCG/ACT INHALER    Inhale 2 puffs into the lungs every 6 (six) hours as needed for wheezing or shortness of breath.   AMIODARONE (PACERONE) 200 MG TABLET    Take 1 tablet (200 mg total) by mouth 2 (two) times daily.   AMLODIPINE (NORVASC) 10 MG TABLET    Take 5 mg by mouth daily.    ASPIRIN 81 MG TABLET    Take 81 mg by mouth daily.   CARVEDILOL (COREG) 25 MG TABLET    Take 6.25 mg by mouth 2 (two) times daily with a meal.   CITALOPRAM (CELEXA) 10 MG TABLET    Take 2 tablets (20 mg total) by mouth daily.   CLONIDINE  (CATAPRES - DOSED IN MG/24 HR) 0.1 MG/24HR PATCH    Rice 0.1 mg onto the skin once a week.   CLONIDINE (CATAPRES) 0.2 MG TABLET    Take 0.1 mg by mouth 3 (three) times daily as needed. AS NEEDED FOR S B/P >160   CLOPIDOGREL (PLAVIX) 75 MG TABLET    Take 75 mg by mouth daily with breakfast.   DIGOXIN (LANOXIN) 0.125 MG TABLET    Take 1 tablet (0.125 mg total) by mouth every other day.   DOCUSATE SODIUM (COLACE) 100 MG CAPSULE    Take 100 mg by mouth 2 (two) times daily.    FERROUS SULFATE 325 (65 FE) MG TABLET    Take 325 mg by mouth 2 (two) times daily with a meal.   HYDROCODONE-ACETAMINOPHEN (NORCO/VICODIN) 5-325 MG PER TABLET    Take one tablet by mouth every 4 hours as needed for pain   LEVOTHYROXINE (SYNTHROID, LEVOTHROID) 50 MCG TABLET    Take 25 mcg by mouth daily before breakfast.    MELATONIN 3 MG CAPS    Take 3 mg by mouth at bedtime.   NITROGLYCERIN (NITROSTAT) 0.4 MG SL TABLET    Rice 0.4 mg under the tongue every 5 (five) minutes as needed. For chest pain   POLYETHYLENE GLYCOL (MIRALAX / GLYCOLAX) PACKET    Take 17 g by mouth 2 (two) times daily.   RANITIDINE (ZANTAC) 150 MG CAPSULE    Take 150 mg by mouth every morning.   SENNA (SENOKOT) 8.6 MG TABS TABLET    Take 1 tablet by mouth at bedtime.   TIOTROPIUM BROMIDE MONOHYDRATE (SPIRIVA RESPIMAT) 2.5 MCG/ACT AERS    Inhale 2 Inhalers into the lungs daily. Use with aerochamber   TORSEMIDE (DEMADEX) 20 MG TABLET    Take 20 mg by mouth daily.   TRAZODONE (DESYREL) 100 MG TABLET    Take 100 mg by mouth at bedtime.  Modified Medications   No medications on file  Discontinued Medications   INSULIN LISPRO (HUMALOG) 100 UNIT/ML INJECTION    Inject 3 Units into the skin 3 (three) times daily before meals. For cbg >=150    SIGNIFICANT DIAGNOSTIC EXAMS   02-21-13: chest x-ray: minimal cardiomegaly without pulmonary vascular congestion. No pleural effusion. Old granulomatous disease; patchy interstitial pneumonitis at lung bases.    03-02-13: chest x-ray: no evidence of acute disease: has  pace maker   10-24-13: EKG: a flutter   LABS REVIEWED:   02-15-13: dig 1.4  02-19-13: tsh 6.651pre-albumin 20.54; albumin 2.8; vit b12: 210  03-05-13: wbc 8.6; hgb 8.9; hct 28.3; mcv 99.6; plt 364; glucose 96; bun 46; creat 2.0; k+4.6; na++143; vit b12: 1840 02-27-13: glucose 139; bun 36; creat 1.8; k+4.6; na++ 138 03-08-13: urine culture: e-coli: vantin 03-15-13: wbc 9.8; hgb 10.7; hct 3.8; mcv 98.8; plt 358  03-26-13: wbc 6.5; hgb 9.4; hct 29.6; mcv 96.1;lt 302;  04-10-13: tsh 3.012  06-08-13: hgb a1c 6.7  08-20-13: chol 118; ldl 52; trig 61 10-24-13: liver normal albulmin 2.8; tsh 3.446; hgb a1c 6.0       Review of Systems  Constitutional: Negative for malaise/fatigue.  Respiratory: Negative for cough and shortness of breath.   Cardiovascular: Negative for chest pain and palpitations.  Gastrointestinal: Negative for abdominal pain and constipation.  Musculoskeletal: Negative for myalgias.  Skin:       Has heel ulcers  Psychiatric/Behavioral: The patient is not nervous/anxious.     Physical Exam  Constitutional: No distress.  Thin   Neck: Neck supple. No JVD present.  Cardiovascular: Normal rate, regular rhythm and intact distal pulses.   Respiratory: Effort normal and breath sounds normal. No respiratory distress.  GI: Soft. Bowel sounds are normal. She exhibits no distension. There is no tenderness.  Musculoskeletal: She exhibits no edema.  Is able to move extremities   Neurological: She is alert.  Skin: Skin is warm and dry. She is not diaphoretic.  Left heel ulceration without signs of infection present 0.3 x 0.3 cm stage II is improving Right heel has resolved.       ASSESSMENT/ PLAN:  1. Left  heel ulceration: will continue her current treatment and plan of care and will monitor her status   2. Afib: her heart rate is stable; is status post pace maker will continue digoxin 0.125 mg every other day  amiodarone 200 mg twice daily  for rate control and will continue asa 81 mg daily   3. Hypothyroidism: will continue synthroid 25 mcg daily   4. Hypertension: is stable will continue norvasc 5 mg daily; coreg 6.25 mg twice daily; clonidine patch 0.1 mg weekly and clonidine 0.1 mg three times daily as needed   5. CAD: no recent complaint of chest pain present; will continue asa 81 mg daily; plavix 75 mg daily  and ntg prn will monitor  6. Copd: is stable will continue albuterol 2 puffs every 6 hours as needed; spiriva respimat 2 puffs daily and will monitor  7. Chronic diastolic heart failure: is stable will continue demadex 20 mg daily and will monitor. Will continue her daily weights   8. Dyslipidemia: is presently not on medications will monitor her status.  Her ldl is 52  9. Anemia: will continue iron twice daily  10. Gerd: will continue zantac 150 mg nightly   11. Depression: is emotionally stable will continue celexa 20 mg daily  12.  Insomnia: is stable will continue trazdodone 100 mg nightly and melatonin 3 mg nightly   13. Constipation: will continue miralax twice daily and colace twice daily   14. Osteoarthritis: is stable will continue  vicodin 5/325 mg every 4 hours as needed  15. Diabetes: is stable; is presently off medications; her hgb a1c is 6.0.        Ok Edwards NP [facility]  Contact [phone] Monday through Friday 8am- 5pm  After hours call [phone]

## 2013-12-18 ENCOUNTER — Non-Acute Institutional Stay (SKILLED_NURSING_FACILITY): Payer: Medicare Other | Admitting: Adult Health

## 2013-12-18 ENCOUNTER — Encounter: Payer: Self-pay | Admitting: Adult Health

## 2013-12-18 DIAGNOSIS — I4819 Other persistent atrial fibrillation: Secondary | ICD-10-CM

## 2013-12-18 DIAGNOSIS — J449 Chronic obstructive pulmonary disease, unspecified: Secondary | ICD-10-CM

## 2013-12-18 DIAGNOSIS — N183 Chronic kidney disease, stage 3 unspecified: Secondary | ICD-10-CM

## 2013-12-18 DIAGNOSIS — I5032 Chronic diastolic (congestive) heart failure: Secondary | ICD-10-CM

## 2013-12-18 DIAGNOSIS — E1159 Type 2 diabetes mellitus with other circulatory complications: Secondary | ICD-10-CM

## 2013-12-18 DIAGNOSIS — I4891 Unspecified atrial fibrillation: Secondary | ICD-10-CM

## 2013-12-18 DIAGNOSIS — I509 Heart failure, unspecified: Secondary | ICD-10-CM

## 2013-12-18 DIAGNOSIS — K219 Gastro-esophageal reflux disease without esophagitis: Secondary | ICD-10-CM

## 2013-12-18 DIAGNOSIS — I110 Hypertensive heart disease with heart failure: Secondary | ICD-10-CM

## 2013-12-18 NOTE — Progress Notes (Signed)
Patient ID: [name], female   DOB: [birth year], 80 y.o.   MRN: [id number]   [facility] (31)  Code Status:  Full Code  Chief Complaint  Patient presents with  . Medical Management of Chronic Issues    HPI: This is an 80 y.o. Female living at [place] [facility]. I am here to review her chronic medical issues. She has no complaints at the present time. She has had issues with heel ulcerations but the wound care nurse noted in her note that the left heel ulcer healed as of 12/17/13. Her weight has varied according to the records and is currently 148.6 lbs.    Allergies  Allergen Reactions  . Naproxen     REACTION: gastritis    MEDICATIONS -  Reviewed Outpatient Encounter Prescriptions as of 12/18/2013  Medication Sig  . albuterol (PROVENTIL HFA;VENTOLIN HFA) 108 (90 BASE) MCG/ACT inhaler Inhale 2 puffs into the lungs every 6 (six) hours as needed for wheezing or shortness of breath.  [name] amiodarone (PACERONE) 200 MG tablet Take 1 tablet (200 mg total) by mouth 2 (two) times daily.  [name] amLODipine (NORVASC) 10 MG tablet Take 5 mg by mouth daily.   [name] aspirin 81 MG tablet Take 81 mg by mouth daily.  . carvedilol (COREG) 25 MG tablet Take 6.25 mg by mouth 2 (two) times daily with a meal.  . citalopram (CELEXA) 10 MG tablet Take 2 tablets (20 mg total) by mouth daily.  . cloNIDine (CATAPRES - DOSED IN MG/24 HR) 0.1 mg/24hr patch Place 0.1 mg onto the skin once a week.  . cloNIDine (CATAPRES) 0.2 MG tablet Take 0.1 mg by mouth 3 (three) times daily as needed. AS NEEDED FOR S B/P >160  . clopidogrel (PLAVIX) 75 MG tablet Take 75 mg by mouth daily with breakfast.  . digoxin (LANOXIN) 0.125 MG tablet Take 1 tablet (0.125 mg total) by mouth every other day.  . docusate sodium (COLACE) 100 MG capsule Take 100 mg by mouth 2 (two) times daily.   . ferrous sulfate 325 (65 FE) MG tablet Take 325 mg by mouth 2 (two) times daily with a meal.  . HYDROcodone-acetaminophen  (NORCO/VICODIN) 5-325 MG per tablet Take one tablet by mouth every 4 hours as needed for pain  . levothyroxine (SYNTHROID, LEVOTHROID) 50 MCG tablet Take 25 mcg by mouth daily before breakfast.   . Melatonin 3 MG CAPS Take 3 mg by mouth at bedtime.  . nitroGLYCERIN (NITROSTAT) 0.4 MG SL tablet Place 0.4 mg under the tongue every 5 (five) minutes as needed. For chest pain  . polyethylene glycol (MIRALAX / GLYCOLAX) packet Take 17 g by mouth 2 (two) times daily.  . ranitidine (ZANTAC) 150 MG capsule Take 150 mg by mouth every morning.  . senna (SENOKOT) 8.6 MG TABS tablet Take 1 tablet by mouth at bedtime.  . Tiotropium Bromide Monohydrate (SPIRIVA RESPIMAT) 2.5 MCG/ACT AERS Inhale 2 Inhalers into the lungs daily. Use with aerochamber  . torsemide (DEMADEX) 20 MG tablet Take 20 mg by mouth daily.  . traZODone (DESYREL) 100 MG tablet Take 100 mg by mouth at bedtime.   SIGNIFICANT DIAGNOSTIC EXAMS  02-21-13: chest x-ray: minimal cardiomegaly without pulmonary vascular congestion. No pleural effusion. Old granulomatous disease; patchy interstitial pneumonitis at lung bases.  03-02-13: chest x-ray: no evidence of acute disease: has pace maker  10-24-13: EKG: a flutter   LABS REVIEWED:  02-15-13: dig 1.4  02-19-13: tsh 6.651pre-albumin 20.54; albumin 2.8; vit b12:  210  03-05-13: wbc 8.6; hgb 8.9; hct 28.3; mcv 99.6; plt 364; glucose 96; bun 46; creat 2.0; k+4.6; na++143; vit b12: 1840  02-27-13: glucose 139; bun 36; creat 1.8; k+4.6; na++ 138  03-08-13: urine culture: e-coli: vantin  03-15-13: wbc 9.8; hgb 10.7; hct 3.8; mcv 98.8; plt 358  03-26-13: wbc 6.5; hgb 9.4; hct 29.6; mcv 96.1;lt 302;  04-10-13: tsh 3.012  06-08-13: hgb a1c 6.7  08-20-13: chol 118; ldl 52; trig 61  10-24-13: liver normal albulmin 2.8; tsh 3.446; hgb a1c 6.0   REVIEW OF SYSTEMS  DATA OBTAINED: from patient, nurse, medical record, poor historian GENERAL: Feels well  No recent fever, fatigue, change in activity status,  appetite, or weight  RESPIRATORY: No cough, wheezing, SOB CARDIAC: No chest pain, palpitations. No edema GI: No abdominal pain  No Nausea,vomiting,diarrhea or constipation  No heartburn or reflux  MUSCULOSKELETAL: Chronic knee pain.  No muscle ache, pain, weakness    Uses WC NEUROLOGIC: No dizziness, fainting, headache, numbness No change in mental status  PSYCHIATRIC: No feelings of anxiety, depression  Sleeps well   No behavior issue  PHYSICAL EXAM Filed Vitals:   12/18/13 1615  BP: 131/58  Pulse: 62  Temp: 98.6 F (37 C)  Resp: 20  Weight: 148 lb 9.6 oz (67.405 kg)   Body mass index is 24.73 kg/(m^2). GENERAL APPEARANCE: No acute distress, appropriately groomed, normal body habitus Alert, pleasant, conversant. SKIN: No diaphoresis, rash, wound HEAD: Normocephalic, atraumatic EYES: Conjunctiva/lids clear  RESPIRATORY: Breathing is even, unlabored  Lung sounds are clear and full  CARDIOVASCULAR: Heart RRR   No murmur or extra heart sounds   EDEMA: +1 edema to both calves MUSCULOSKELETAL.  Uses WC. Strength 4/5 to BUE and BLE PSYCHIATRIC: Mood and affect appropriate to situation Alert and oriented x2  ASSESSMENT/PLAN  A-fib Rate controlled. Not a coumadin candidate. Continue amiodarone (has a hx of QT prolongation, EKG yearly ordered), Plavix, ASA, and Digoxin. Last Digoxin level 1.4 in January.  Type II or unspecified type diabetes mellitus with peripheral circulatory disorders, uncontrolled(250.72) Stable off meds. A1C at goal 6.7 in jan 2015.   CKD (chronic kidney disease) stage 3, GFR 30-59 ml/min Last Bun/Cr improved slightly at 37/2.0 in Jan 2015. She will need to continue the Demadex to prevent fluid overload. She is not on an ACE and would not start one at her age given her advanced age and dementia. BMP q 6 months  COPD (chronic obstructive pulmonary disease) Stable on Spiriva. Continues to use snuff. Will place her on the list to be followed by the dentist for yearly  dental exams.  Diastolic congestive heart failure Stable on Demadex and Coreg. Minimal edema in her legs, weights are fairly stable, and no resp complaints. Continue and monitor.  Hypertensive heart disease Stable BP on current regimen. Continue Clonidine patch at 0.1, Coreg, and Norvasc. BMP q 6 months  GERD (gastroesophageal reflux disease) Stable on Zantac. No change.    Bard Herbert NP/Christina Wert RN, MSN  12/18/2013

## 2013-12-18 NOTE — Assessment & Plan Note (Signed)
Stable BP on current regimen. Continue Clonidine patch at 0.1, Coreg, and Norvasc. BMP q 6 months

## 2013-12-18 NOTE — Assessment & Plan Note (Signed)
Stable on Zantac. No change.

## 2013-12-18 NOTE — Assessment & Plan Note (Addendum)
Stable on Demadex and Coreg. Minimal edema in her legs, weights are fairly stable, and no resp complaints. Continue and monitor.

## 2013-12-18 NOTE — Assessment & Plan Note (Addendum)
Last Bun/Cr improved slightly at 37/2.0 in Jan 2015. She will need to continue the Demadex to prevent fluid overload. She is not on an ACE and would not start one at her age given her advanced age and dementia. BMP q 6 months

## 2013-12-18 NOTE — Assessment & Plan Note (Addendum)
Rate controlled. Not a coumadin candidate. Continue amiodarone (has a hx of QT prolongation, EKG yearly ordered), Plavix, ASA, and Digoxin. Last Digoxin level 1.4 in January.

## 2013-12-18 NOTE — Assessment & Plan Note (Signed)
Stable on Spiriva. Continues to use snuff. Will place her on the list to be followed by the dentist for yearly dental exams.

## 2013-12-18 NOTE — Assessment & Plan Note (Signed)
Stable off meds. A1C at goal 6.7 in jan 2015.

## 2013-12-25 ENCOUNTER — Encounter: Payer: Self-pay | Admitting: *Deleted

## 2014-01-22 ENCOUNTER — Non-Acute Institutional Stay (SKILLED_NURSING_FACILITY): Payer: Medicare Other | Admitting: Adult Health

## 2014-01-22 DIAGNOSIS — I482 Chronic atrial fibrillation, unspecified: Secondary | ICD-10-CM

## 2014-01-22 DIAGNOSIS — J449 Chronic obstructive pulmonary disease, unspecified: Secondary | ICD-10-CM

## 2014-01-22 DIAGNOSIS — M1990 Unspecified osteoarthritis, unspecified site: Secondary | ICD-10-CM

## 2014-01-22 DIAGNOSIS — E785 Hyperlipidemia, unspecified: Secondary | ICD-10-CM

## 2014-01-22 DIAGNOSIS — I4891 Unspecified atrial fibrillation: Secondary | ICD-10-CM

## 2014-01-22 DIAGNOSIS — E039 Hypothyroidism, unspecified: Secondary | ICD-10-CM

## 2014-01-22 DIAGNOSIS — I110 Hypertensive heart disease with heart failure: Secondary | ICD-10-CM

## 2014-01-22 DIAGNOSIS — I5032 Chronic diastolic (congestive) heart failure: Secondary | ICD-10-CM

## 2014-01-22 DIAGNOSIS — I509 Heart failure, unspecified: Secondary | ICD-10-CM

## 2014-01-27 ENCOUNTER — Encounter: Payer: Self-pay | Admitting: Adult Health

## 2014-01-27 NOTE — Progress Notes (Signed)
Patient ID: [name], female   DOB: 29-Jul-1923, 80 y.o.   MRN: [id number]     [facility]  Allergies  Allergen Reactions  . Naproxen     REACTION: gastritis     Chief Complaint  Patient presents with  . Medical Management of Chronic Issues    HPI:  She is a long term resident of this facility. She is being seen for the management of her chronic illnesses. Overall her status remains without change. There are no concerns being voiced by the nursing staff at this time. She states she is not sleeping well at night.   Past Medical History  Diagnosis Date  . Coronary artery disease     a. Acute MI 2003, stent to LCx. b. NSTEMI 2008 (cath w/o PCI).  c. +trop 02/2011 felt 2/2 demand ischemia.  [name] History of atrial flutter 2009    a. s/p ablation of typical flutter 09/2007.  [name] Paroxysmal atrial fibrillation     a. Dx 2009. b. Discontinuation of Coumadin 01/2008 2/2 hemarthrosis/chronic debilitation.  [name] COPD (chronic obstructive pulmonary disease)   . Hyperlipidemia   . Diabetes mellitus   . Diastolic congestive heart failure   . Arthritis   . Chronic kidney disease   . Carotid artery disease     a. Bilateral  . SSS (sick sinus syndrome)     a. Symptomatic bradycardia/syncope and post-termination pauses - St. Jude dual chamber PPM 09/2007.   [name] Pneumonia   . Anxiety     adjustment disorder  . Pacemaker   . Mesenteric ischemia     a. Possible dx, 2013.  . Diverticula of colon     a. Colonoscopy 01/2012.  [name] PVD (peripheral vascular disease)     Carotid dz as noted, repoted aortoiliac occlusive dz, also reported high-grade stenosis at the origin & prox innominate artery, high-grade stenosis of the right carotid bifurcation, moderate stenosis of the proximal prevertebral left subclavian artery by CT 02/2011  . Pituitary tumor     s/p gamma knife  . Tachy-brady syndrome   . ?? Subclavian artery stenosis, right 04/04/2012  . SMA stenosis-moderate 04/04/2012  . Diverticulosis  of colon (without mention of hemorrhage) 01/27/2012  . 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 CAROTID BRUIT, RIGHT 04/26/2007    Qualifier: Diagnosis of  By: Maxie Better FNP, Rosalita Levan   . Cardiac pacemaker in situ 09/15/2009    Qualifier: Diagnosis of  By: Lovena Le, MD, Martyn Malay   . GERD (gastroesophageal reflux disease) 08/09/2013    Past Surgical History  Procedure Laterality Date  . Cardiac catheterization      2003 cardiac stent  . Insert / replace / remove pacemaker  2009    St. Jude  . Cholecystectomy    . Total knee arthroplasty      bilateral  . Bilateral hip arthroscopy    . 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 Myocardial perfusion study      EF 59% 4/04  . Adenosine myoview study      abn EF 53% 12/04/04  . Carotid doppler  2/99  . Echo with lvh  1/98    old records  . Stress cardiolite  3/04    EF 53%  . Gamma knife radiosurg  08/25/06    [facility]  . Pvd      with  stent in the right iliac   . Colonoscopy  01/27/2012    Procedure: COLONOSCOPY;  Surgeon: Inda Castle, MD;  Location: [place];  Service: Endoscopy;  Laterality: N/A;    VITAL SIGNS BP 102/58  Pulse 68  Ht 5\' 5"  (1.651 m)  Wt 148 lb 9.6 oz (67.405 kg)  BMI 24.73 kg/m2   Patient's Medications  New Prescriptions   No medications on file  Previous Medications   ALBUTEROL (PROVENTIL HFA;VENTOLIN HFA) 108 (90 BASE) MCG/ACT INHALER    Inhale 2 puffs into the lungs every 6 (six) hours as needed for wheezing or shortness of breath.   AMIODARONE (PACERONE) 200 MG TABLET    Take 1 tablet (200 mg total) by mouth 2 (two) times daily.   AMLODIPINE (NORVASC) 10 MG TABLET    Take 5 mg by mouth daily.    ASPIRIN 81 MG TABLET    Take 81 mg by mouth daily.   CARVEDILOL (COREG) 25 MG TABLET    Take 6.25 mg by mouth 2 (two) times daily with a meal.   CITALOPRAM (CELEXA) 10  MG TABLET    Take 2 tablets (20 mg total) by mouth daily.   CLONIDINE (CATAPRES - DOSED IN MG/24 HR) 0.1 MG/24HR PATCH    Place 0.1 mg onto the skin once a week.   CLONIDINE (CATAPRES) 0.2 MG TABLET    Take 0.1 mg by mouth 3 (three) times daily as needed. AS NEEDED FOR S B/P >160   CLOPIDOGREL (PLAVIX) 75 MG TABLET    Take 75 mg by mouth daily with breakfast.   DIGOXIN (LANOXIN) 0.125 MG TABLET    Take 1 tablet (0.125 mg total) by mouth every other day.   DOCUSATE SODIUM (COLACE) 100 MG CAPSULE    Take 100 mg by mouth 2 (two) times daily.    FERROUS SULFATE 325 (65 FE) MG TABLET    Take 325 mg by mouth 2 (two) times daily with a meal.   HYDROCODONE-ACETAMINOPHEN (NORCO/VICODIN) 5-325 MG PER TABLET    Take one tablet by mouth every 4 hours as needed for pain   LEVOTHYROXINE (SYNTHROID, LEVOTHROID) 50 MCG TABLET    Take 25 mcg by mouth daily before breakfast.    MELATONIN 3 MG CAPS    Take 3 mg by mouth at bedtime.   NITROGLYCERIN (NITROSTAT) 0.4 MG SL TABLET    Place 0.4 mg under the tongue every 5 (five) minutes as needed. For chest pain   POLYETHYLENE GLYCOL (MIRALAX / GLYCOLAX) PACKET    Take 17 g by mouth 2 (two) times daily.   RANITIDINE (ZANTAC) 150 MG CAPSULE    Take 150 mg by mouth every morning.   SENNA (SENOKOT) 8.6 MG TABS TABLET    Take 1 tablet by mouth at bedtime.   TIOTROPIUM BROMIDE MONOHYDRATE (SPIRIVA RESPIMAT) 2.5 MCG/ACT AERS    Inhale 2 Inhalers into the lungs daily. Use with aerochamber   TORSEMIDE (DEMADEX) 20 MG TABLET    Take 20 mg by mouth daily.   TRAZODONE (DESYREL) 100 MG TABLET    Take 100 mg by mouth at bedtime.  Modified Medications   No medications on file  Discontinued Medications   No medications on file    SIGNIFICANT DIAGNOSTIC EXAMS  02-21-13: chest x-ray: minimal cardiomegaly without pulmonary vascular congestion. No pleural effusion. Old granulomatous disease; patchy interstitial pneumonitis at lung bases.   03-02-13: chest x-ray: no evidence of acute  disease: has pace maker   10-24-13: EKG: a  flutter   LABS REVIEWED:   02-15-13: dig 1.4  02-19-13: tsh 6.651pre-albumin 20.54; albumin 2.8; vit b12: 210  03-05-13: wbc 8.6; hgb 8.9; hct 28.3; mcv 99.6; plt 364; glucose 96; bun 46; creat 2.0; k+4.6; na++143; vit b12: 1840 02-27-13: glucose 139; bun 36; creat 1.8; k+4.6; na++ 138 03-08-13: urine culture: e-coli: vantin 03-15-13: wbc 9.8; hgb 10.7; hct 3.8; mcv 98.8; plt 358  03-26-13: wbc 6.5; hgb 9.4; hct 29.6; mcv 96.1;lt 302;  04-10-13: tsh 3.012  06-08-13: hgb a1c 6.7  08-20-13: chol 118; ldl 52; trig 61 10-24-13: liver normal albulmin 2.8; tsh 3.446; hgb a1c 6.0   11-15-13: wbc 5.;7 hgb 11.0; hct 34.7 ;mcv 105.7; plt 397; glucose 71; bun 37; creat 2.0; k+4.4; na++137 01-04-14: dig 1.0      Review of Systems  Constitutional: Negative for malaise/fatigue.  Respiratory: Negative for cough and shortness of breath.   Cardiovascular: Negative for chest pain and palpitations.  Gastrointestinal: Negative for abdominal pain and constipation.  Musculoskeletal: Negative for myalgias.  Skin: no concerns  Psychiatric/Behavioral: The patient is not nervous/anxious.  has insomnia    Physical Exam  Constitutional: No distress.  Thin   Neck: Neck supple. No JVD present.  Cardiovascular: Normal rate, regular rhythm and intact distal pulses.   Respiratory: Effort normal and breath sounds normal. No respiratory distress.  GI: Soft. Bowel sounds are normal. She exhibits no distension. There is no tenderness.  Musculoskeletal: She exhibits no edema.  Is able to move extremities   Neurological: She is alert.  Skin: Skin is warm and dry. She is not diaphoretic.      ASSESSMENT/ PLAN:   1. Afib: her heart rate is stable; is status post pace maker will continue digoxin 0.125 mg every other day amiodarone 200 mg twice daily  for rate control and will continue asa 81 mg daily   2. Hypothyroidism: will continue synthroid 25 mcg daily   3.  Hypertension: is stable will continue norvasc 5 mg daily; coreg 6.25 mg twice daily; clonidine patch 0.1 mg weekly and clonidine 0.1 mg three times daily as needed   4. CAD: no recent complaint of chest pain present; will continue asa 81 mg daily; plavix 75 mg daily  and ntg prn will monitor  5. Copd: is stable will continue albuterol 2 puffs every 6 hours as needed; spiriva respimat 2 puffs daily and will monitor  6. Chronic diastolic heart failure: is stable will continue demadex 20 mg daily and will monitor. Will continue her daily weights   7. Dyslipidemia: is presently not on medications will monitor her status.  Her ldl is 52  8. Anemia: will lower her iron to one time daily   9. Gerd: will continue zantac 150 mg nightly   10. Depression: is emotionally stable will continue celexa 20 mg daily  11.  Insomnia:will continue trazdodone 100 mg nightly and will increase her melatonin to 6 mg nightly   12. Constipation: will continue miralax twice daily and colace twice daily   13. Osteoarthritis: is stable will continue  vicodin 5/325 mg every 4 hours as needed  14. Diabetes: is stable; is presently off medications; her hgb a1c is 6.0.     Will get a urine for micro-albumin and will place her on the eye and foot dr list.    Ok Edwards NP [facility]  Contact [phone] Monday through Friday 8am- 5pm  After hours call [phone]

## 2014-02-14 ENCOUNTER — Non-Acute Institutional Stay (SKILLED_NURSING_FACILITY): Payer: Medicare Other | Admitting: Adult Health

## 2014-02-14 DIAGNOSIS — J449 Chronic obstructive pulmonary disease, unspecified: Secondary | ICD-10-CM

## 2014-02-14 DIAGNOSIS — I482 Chronic atrial fibrillation, unspecified: Secondary | ICD-10-CM

## 2014-02-14 DIAGNOSIS — I2510 Atherosclerotic heart disease of native coronary artery without angina pectoris: Secondary | ICD-10-CM

## 2014-02-14 DIAGNOSIS — I10 Essential (primary) hypertension: Secondary | ICD-10-CM

## 2014-02-14 DIAGNOSIS — E785 Hyperlipidemia, unspecified: Secondary | ICD-10-CM

## 2014-02-14 DIAGNOSIS — G4700 Insomnia, unspecified: Secondary | ICD-10-CM

## 2014-02-14 DIAGNOSIS — K219 Gastro-esophageal reflux disease without esophagitis: Secondary | ICD-10-CM

## 2014-02-14 DIAGNOSIS — I5032 Chronic diastolic (congestive) heart failure: Secondary | ICD-10-CM

## 2014-02-18 NOTE — Progress Notes (Signed)
Patient ID: [name], female   DOB: January 31, 1924, 80 y.o.   MRN: [id number]    [facility]  Allergies  Allergen Reactions  . Naproxen     REACTION: gastritis     Chief Complaint  Patient presents with  . Medical Management of Chronic Issues    HPI:  She is a long term resident of this facility being seen for the management of her chronic illnesses. Overall her status is without significant change. She is not voicing any concerns today. The nursing staff is not voicing any concerns as well.    Past Medical History  Diagnosis Date  . Coronary artery disease     a. Acute MI 2003, stent to LCx. b. NSTEMI 2008 (cath w/o PCI).  c. +trop 02/2011 felt 2/2 demand ischemia.  [name] History of atrial flutter 2009    a. s/p ablation of typical flutter 09/2007.  [name] Paroxysmal atrial fibrillation     a. Dx 2009. b. Discontinuation of Coumadin 01/2008 2/2 hemarthrosis/chronic debilitation.  [name] COPD (chronic obstructive pulmonary disease)   . Hyperlipidemia   . Diabetes mellitus   . Diastolic congestive heart failure   . Arthritis   . Chronic kidney disease   . Carotid artery disease     a. Bilateral  . SSS (sick sinus syndrome)     a. Symptomatic bradycardia/syncope and post-termination pauses - St. Jude dual chamber PPM 09/2007.   [name] Pneumonia   . Anxiety     adjustment disorder  . Pacemaker   . Mesenteric ischemia     a. Possible dx, 2013.  . Diverticula of colon     a. Colonoscopy 01/2012.  [name] PVD (peripheral vascular disease)     Carotid dz as noted, repoted aortoiliac occlusive dz, also reported high-grade stenosis at the origin & prox innominate artery, high-grade stenosis of the right carotid bifurcation, moderate stenosis of the proximal prevertebral left subclavian artery by CT 02/2011  . Pituitary tumor     s/p gamma knife  . Tachy-brady syndrome   . ?? Subclavian artery stenosis, right 04/04/2012  . SMA stenosis-moderate 04/04/2012  . Diverticulosis of colon (without  mention of hemorrhage) 01/27/2012  . 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 CAROTID BRUIT, RIGHT 04/26/2007    Qualifier: Diagnosis of  By: Maxie Better FNP, Rosalita Levan   . Cardiac pacemaker in situ 09/15/2009    Qualifier: Diagnosis of  By: Lovena Le, MD, Martyn Malay   . GERD (gastroesophageal reflux disease) 08/09/2013  . Pressure ulcer, heel 06/28/2013    Past Surgical History  Procedure Laterality Date  . Cardiac catheterization      2003 cardiac stent  . Insert / replace / remove pacemaker  2009    [employer]  . Cholecystectomy    . Total knee arthroplasty      bilateral  . Bilateral hip arthroscopy    . 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 Myocardial perfusion study      EF 59% 4/04  . Adenosine myoview study      abn EF 53% 12/04/04  . Carotid doppler  2/99  . Echo with lvh  1/98    old records  . Stress cardiolite  3/04    EF 53%  . Gamma knife radiosurg  08/25/06    [facility]  . Pvd      with  stent in the right iliac   . Colonoscopy  01/27/2012    Procedure: COLONOSCOPY;  Surgeon: Inda Castle, MD;  Location: [place];  Service: Endoscopy;  Laterality: N/A;    VITAL SIGNS BP 124/56  Pulse 62  Ht 5\' 5"  (1.651 m)  Wt 152 lb 12.8 oz (69.31 kg)  BMI 25.43 kg/m2   Patient's Medications  New Prescriptions   No medications on file  Previous Medications   ALBUTEROL (PROVENTIL HFA;VENTOLIN HFA) 108 (90 BASE) MCG/ACT INHALER    Inhale 2 puffs into the lungs every 6 (six) hours as needed for wheezing or shortness of breath.   AMIODARONE (PACERONE) 200 MG TABLET    Take 1 tablet (200 mg total) by mouth 2 (two) times daily.   AMLODIPINE (NORVASC) 10 MG TABLET    Take 5 mg by mouth daily.    ASPIRIN 81 MG TABLET    Take 81 mg by mouth daily.   CARVEDILOL (COREG) 25 MG TABLET    Take 6.25 mg by mouth 2 (two) times daily with a meal.    CITALOPRAM (CELEXA) 10 MG TABLET    Take 2 tablets (20 mg total) by mouth daily.   CLONIDINE (CATAPRES - DOSED IN MG/24 HR) 0.1 MG/24HR PATCH    Place 0.1 mg onto the skin once a week.   CLOPIDOGREL (PLAVIX) 75 MG TABLET    Take 75 mg by mouth daily with breakfast.   DIGOXIN (LANOXIN) 0.125 MG TABLET    Take 1 tablet (0.125 mg total) by mouth every other day.   DOCUSATE SODIUM (COLACE) 100 MG CAPSULE    Take 100 mg by mouth 2 (two) times daily.    FERROUS SULFATE 325 (65 FE) MG TABLET    Take 325 mg by mouth daily with breakfast.    HYDROCODONE-ACETAMINOPHEN (NORCO/VICODIN) 5-325 MG PER TABLET    Take one tablet by mouth every 4 hours as needed for pain   LEVOTHYROXINE (SYNTHROID, LEVOTHROID) 50 MCG TABLET    Take 25 mcg by mouth daily before breakfast.    MELATONIN 3 MG CAPS    Take 6 mg by mouth at bedtime.    NITROGLYCERIN (NITROSTAT) 0.4 MG SL TABLET    Place 0.4 mg under the tongue every 5 (five) minutes as needed. For chest pain   POLYETHYLENE GLYCOL (MIRALAX / GLYCOLAX) PACKET    Take 17 g by mouth 2 (two) times daily.   RANITIDINE (ZANTAC) 150 MG CAPSULE    Take 150 mg by mouth every morning.   SENNA (SENOKOT) 8.6 MG TABS TABLET    Take 1 tablet by mouth at bedtime.   TIOTROPIUM BROMIDE MONOHYDRATE (SPIRIVA RESPIMAT) 2.5 MCG/ACT AERS    Inhale 2 Inhalers into the lungs daily. Use with aerochamber   TORSEMIDE (DEMADEX) 20 MG TABLET    Take 20 mg by mouth daily.   TRAZODONE (DESYREL) 100 MG TABLET    Take 100 mg by mouth at bedtime.  Modified Medications   No medications on file  Discontinued Medications   CLONIDINE (CATAPRES) 0.2 MG TABLET    Take 0.1 mg by mouth 3 (three) times daily as needed. AS NEEDED FOR S B/P >160    SIGNIFICANT DIAGNOSTIC EXAMS   02-21-13: chest x-ray: minimal cardiomegaly without pulmonary vascular congestion. No pleural effusion. Old granulomatous disease; patchy interstitial pneumonitis at lung bases.   03-02-13: chest x-ray: no evidence of acute disease:  has pace maker   10-24-13: EKG: a flutter   LABS REVIEWED:  02-15-13: dig 1.4  02-19-13: tsh 6.651pre-albumin 20.54; albumin 2.8; vit b12: 210  03-05-13: wbc 8.6; hgb 8.9; hct 28.3; mcv 99.6; plt 364; glucose 96; bun 46; creat 2.0; k+4.6; na++143; vit b12: 1840 02-27-13: glucose 139; bun 36; creat 1.8; k+4.6; na++ 138 03-08-13: urine culture: e-coli: vantin 03-15-13: wbc 9.8; hgb 10.7; hct 3.8; mcv 98.8; plt 358  03-26-13: wbc 6.5; hgb 9.4; hct 29.6; mcv 96.1;lt 302;  04-10-13: tsh 3.012  06-08-13: hgb a1c 6.7  08-20-13: chol 118; ldl 52; trig 61 10-24-13: liver normal albulmin 2.8; tsh 3.446; hgb a1c 6.0   11-15-13: wbc 5.;7 hgb 11.0; hct 34.7 ;mcv 105.7; plt 397; glucose 71; bun 37; creat 2.0; k+4.4; na++137 01-04-14: dig 1.0      Review of Systems  Constitutional: Negative for malaise/fatigue.  Respiratory: Negative for cough and shortness of breath.   Cardiovascular: Negative for chest pain and palpitations.  Gastrointestinal: Negative for abdominal pain and constipation.  Musculoskeletal: Negative for myalgias.  Skin: no concerns  Psychiatric/Behavioral: The patient is not nervous/anxious.    Physical Exam  Constitutional: No distress.  Thin   Neck: Neck supple. No JVD present.  Cardiovascular: Normal rate, regular rhythm and intact distal pulses.   Respiratory: Effort normal and breath sounds normal. No respiratory distress.  GI: Soft. Bowel sounds are normal. She exhibits no distension. There is no tenderness.  Musculoskeletal: She exhibits no edema.  Is able to move extremities   Neurological: She is alert.  Skin: Skin is warm and dry. She is not diaphoretic.      ASSESSMENT/ PLAN:   1. Afib: her heart rate is stable; is status post pace maker will continue digoxin 0.125 mg every other day amiodarone 200 mg twice daily  for rate control and will continue asa 81 mg daily   2. Hypothyroidism: will continue synthroid 25 mcg daily   3. Hypertension: is stable will  continue norvasc 5 mg daily; coreg 6.25 mg twice daily; clonidine patch 0.1 mg weekly  4. CAD: no recent complaint of chest pain present; will continue asa 81 mg daily; plavix 75 mg daily  and ntg prn will monitor  5. Copd: is stable will continue albuterol 2 puffs every 6 hours as needed; spiriva respimat 2 puffs daily and will monitor  6. Chronic diastolic heart failure: is stable will continue demadex 20 mg daily and will monitor. Will continue her daily weights   7. Dyslipidemia: is presently not on medications will monitor her status.  Her ldl is 52  8. Anemia: will continue iron  one time daily   9. Gerd: will continue zantac 150 mg nightly   10. Depression: is emotionally stable will continue celexa 20 mg daily  11.  Insomnia:will continue trazdodone 100 mg nightly and continue  melatonin 6 mg nightly   12. Constipation: will continue miralax twice daily and colace twice daily   13. Osteoarthritis: is stable will continue  vicodin 5/325 mg every 4 hours as needed  14. Diabetes: is stable; is presently off medications; her hgb a1c is 6.0.      Ok Edwards NP [facility]  Contact [phone] Monday through Friday 8am- 5pm  After hours call [phone]

## 2014-02-19 ENCOUNTER — Encounter: Payer: Self-pay | Admitting: *Deleted

## 2014-03-22 ENCOUNTER — Non-Acute Institutional Stay (SKILLED_NURSING_FACILITY): Payer: Medicare Other | Admitting: Internal Medicine

## 2014-03-22 ENCOUNTER — Encounter: Payer: Self-pay | Admitting: Internal Medicine

## 2014-03-22 DIAGNOSIS — E039 Hypothyroidism, unspecified: Secondary | ICD-10-CM

## 2014-03-22 DIAGNOSIS — I5032 Chronic diastolic (congestive) heart failure: Secondary | ICD-10-CM

## 2014-03-22 DIAGNOSIS — I509 Heart failure, unspecified: Secondary | ICD-10-CM

## 2014-03-22 DIAGNOSIS — I482 Chronic atrial fibrillation, unspecified: Secondary | ICD-10-CM

## 2014-03-22 DIAGNOSIS — I110 Hypertensive heart disease with heart failure: Secondary | ICD-10-CM

## 2014-03-22 NOTE — Progress Notes (Signed)
Patient ID: [name], female   DOB: 01/24/1924, 80 y.o.   MRN: [id number]   Place of Service: [facility]  Allergies  Allergen Reactions  . Naproxen     REACTION: gastritis    Code Status: Full Code  Goals of Care: Longevity/Long term care  Chief Complaint  Patient presents with  . Medical Management of Chronic Issues    afib, hypothyroidism, htn, chronic diastolic hf    HPI 80 y.o. female with PMH of afib, hypothyroidism, HTN, chronic diastolic HF among others is being seen for a routine visit. No recent falls or skin concerns reported. Weight stable. No change in behaviors or functional status. No concerns from staff. No complaints verbalized from patient.   Review of Systems Constitutional: Negative for fever, chills, and fatigue. HENT: Negative for ear pain, congestion, and sore throat Eyes: Negative for eye pain, eye discharge, and visual disturbance. Wears glasses  Cardiovascular: Negative for chest pain, palpitations. Positive for leg swelling (no more than usual) Respiratory: Negative cough, shortness of breath, and wheezing.  Gastrointestinal: Negative for nausea and vomiting. Negative for abdominal pain, diarrhea and constipation.  Genitourinary: Negative for  dysuria, frequency, urgency, and hematuria Musculoskeletal: Negative for back pain, joint pain, and joint swelling  Neurological: Negative for dizziness, headache, weakness, and tremors.  Skin: Negative for rash and wound.   Psychiatric: Negative for nervous/anxious, agitation, and depression  Past Medical History  Diagnosis Date  . Coronary artery disease     a. Acute MI 2003, stent to LCx. b. NSTEMI 2008 (cath w/o PCI).  c. +trop 02/2011 felt 2/2 demand ischemia.  [name] History of atrial flutter 2009    a. s/p ablation of typical flutter 09/2007.  [name] Paroxysmal atrial fibrillation     a. Dx 2009. b. Discontinuation of Coumadin 01/2008 2/2 hemarthrosis/chronic debilitation.  [name] COPD (chronic  obstructive pulmonary disease)   . Hyperlipidemia   . Diabetes mellitus   . Diastolic congestive heart failure   . Arthritis   . Chronic kidney disease   . Carotid artery disease     a. Bilateral  . SSS (sick sinus syndrome)     a. Symptomatic bradycardia/syncope and post-termination pauses - St. Jude dual chamber PPM 09/2007.   [name] Pneumonia   . Anxiety     adjustment disorder  . Pacemaker   . Mesenteric ischemia     a. Possible dx, 2013.  . Diverticula of colon     a. Colonoscopy 01/2012.  [name] PVD (peripheral vascular disease)     Carotid dz as noted, repoted aortoiliac occlusive dz, also reported high-grade stenosis at the origin & prox innominate artery, high-grade stenosis of the right carotid bifurcation, moderate stenosis of the proximal prevertebral left subclavian artery by CT 02/2011  . Pituitary tumor     s/p gamma knife  . Tachy-brady syndrome   . ?? Subclavian artery stenosis, right 04/04/2012  . SMA stenosis-moderate 04/04/2012  . Diverticulosis of colon (without mention of hemorrhage) 01/27/2012  . 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 CAROTID BRUIT, RIGHT 04/26/2007    Qualifier: Diagnosis of  By: Maxie Better FNP, Rosalita Levan   . Cardiac pacemaker in situ 09/15/2009    Qualifier: Diagnosis of  By: Lovena Le, MD, Martyn Malay   . GERD (gastroesophageal reflux disease) 08/09/2013  . Pressure ulcer, heel 06/28/2013    Past Surgical History  Procedure Laterality Date  .  Cardiac catheterization      2003 cardiac stent  . Insert / replace / remove pacemaker  2009    St. Jude  . Cholecystectomy    . Total knee arthroplasty      bilateral  . Bilateral hip arthroscopy    . 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 Myocardial perfusion study      EF 59% 4/04  . Adenosine myoview study      abn EF 53% 12/04/04  . Carotid doppler  2/99  . Echo  with lvh  1/98    old records  . Stress cardiolite  3/04    EF 53%  . Gamma knife radiosurg  08/25/06    [facility]  . Pvd      with stent in the right iliac   . Colonoscopy  01/27/2012    Procedure: COLONOSCOPY;  Surgeon: Inda Castle, MD;  Location: [facility];  Service: Endoscopy;  Laterality: N/A;    History   Social History  . Marital Status: Widowed    Spouse Name: N/A    Number of Children: N/A  . Years of Education: N/A   Occupational History  . Not on file.   Social History Main Topics  . Smoking status: Current Every Day Smoker -- 0.25 packs/day for 40 years    Types: Cigarettes    Last Attempt to Quit: 04/02/2012  . Smokeless tobacco: Current User    Types: Snuff  . Alcohol Use: No  . Drug Use: No  . Sexual Activity: Not on file   Other Topics Concern  . Not on file   Social History Narrative   Widowed, husband died at age 19-DM, HTN. 3 children.    Nursing assistant in past, active in church.    Son lives with her and does most of the housework and cooking. 1/10   Pt signed party release form and gives Danise Edge, granddaughter [phone]), access to medical records. Can leave msg on machine [phone]) or cell.       Medication List       This list is accurate as of: 03/22/14  3:28 PM.  Always use your most recent med list.               albuterol 108 (90 BASE) MCG/ACT inhaler  Commonly known as:  PROVENTIL HFA;VENTOLIN HFA  Inhale 2 puffs into the lungs every 6 (six) hours as needed for wheezing or shortness of breath.     amiodarone 200 MG tablet  Commonly known as:  PACERONE  Take 1 tablet (200 mg total) by mouth 2 (two) times daily.     amLODipine 10 MG tablet  Commonly known as:  NORVASC  Take 5 mg by mouth daily.     aspirin 81 MG tablet  Take 81 mg by mouth daily.     carvedilol 25 MG tablet  Commonly known as:  COREG  Take 6.25 mg by mouth 2 (two) times daily with a meal.     citalopram 10 MG tablet  Commonly known as:   CELEXA  Take 2 tablets (20 mg total) by mouth daily.     cloNIDine 0.1 mg/24hr patch  Commonly known as:  CATAPRES - Dosed in mg/24 hr  Place 0.1 mg onto the skin once a week.     clopidogrel 75 MG tablet  Commonly known as:  PLAVIX  Take 75 mg by mouth daily with breakfast.  digoxin 0.125 MG tablet  Commonly known as:  LANOXIN  Take 1 tablet (0.125 mg total) by mouth every other day.     docusate sodium 100 MG capsule  Commonly known as:  COLACE  Take 100 mg by mouth 2 (two) times daily.     ferrous sulfate 325 (65 FE) MG tablet  Take 325 mg by mouth daily with breakfast.     HYDROcodone-acetaminophen 5-325 MG per tablet  Commonly known as:  NORCO/VICODIN  Take one tablet by mouth every 4 hours as needed for pain     levothyroxine 50 MCG tablet  Commonly known as:  SYNTHROID, LEVOTHROID  Take 25 mcg by mouth daily before breakfast.     Melatonin 3 MG Caps  Take 6 mg by mouth at bedtime.     nitroGLYCERIN 0.4 MG SL tablet  Commonly known as:  NITROSTAT  Place 0.4 mg under the tongue every 5 (five) minutes as needed. For chest pain     polyethylene glycol packet  Commonly known as:  MIRALAX / GLYCOLAX  Take 17 g by mouth 2 (two) times daily.     ranitidine 150 MG capsule  Commonly known as:  ZANTAC  Take 150 mg by mouth every morning.     senna 8.6 MG Tabs tablet  Commonly known as:  SENOKOT  Take 1 tablet by mouth at bedtime.     Tiotropium Bromide Monohydrate 2.5 MCG/ACT Aers  Commonly known as:  SPIRIVA RESPIMAT  Inhale 2 Inhalers into the lungs daily. Use with aerochamber     torsemide 20 MG tablet  Commonly known as:  DEMADEX  Take 20 mg by mouth daily.     traZODone 100 MG tablet  Commonly known as:  DESYREL  Take 100 mg by mouth at bedtime.     vitamin B-12 1000 MCG tablet  Commonly known as:  CYANOCOBALAMIN  Take 1,000 mcg by mouth daily.        Physical Exam Filed Vitals:   03/22/14 1508  BP: 121/86  Pulse: 66  Temp: 96.9 F (36.1  C)  Resp: 18   Constitutional: WDWN elderly female in no acute distress. Pleasant and conversant HEENT: Normocephalic and atraumatic. PERRL. EOM intact. No icterus. Oral mucosa moist. Posterior pharynx clear of any exudate or lesions.  Neck: Supple and nontender. No lymphadenopathy, masses, or thyromegaly. No JVD or carotid bruits. Cardiac: Normal S1, S2. RRR with 1/6 systolic murmurs. Distal pulses intact.1+ pitting leg edema bilaterally.  Lungs: No respiratory distress. Breath sounds clear bilaterally without rales, rhonchi, or wheezes. Abdomen: Audible bowel sounds in all quadrants. Soft, nontender, nondistended. No palpable mass.  Musculoskeletal: Able to move all extremities. No joint erythema or tenderness.  Skin: Warm and dry. No rash noted. No erythema.  Neurological: Alert and oriented to person and place.  Psychiatric: Judgment and insight adequate. Appropriate mood and affect.   Labs Reviewed  01/04/14 digoxin level 1.4  CBC Latest Ref Rng 11/15/2013 01/25/2013 09/25/2012  WBC - 5.7 7.1 9.1  Hemoglobin 12.0 - 16.0 g/dL 11.0(A) 10.5(L) 11.6(L)  Hematocrit 36 - 46 % 35(A) 32.0(L) 35.1(L)  Platelets 150 - 399 K/L 397 393.0 317.0    CMP     Component Value Date/Time   NA 135* 11/15/2013   NA 136 01/31/2013 1101   K 4.4 11/15/2013   CL 100 01/31/2013 1101   CO2 32 01/31/2013 1101   GLUCOSE 142* 01/31/2013 1101   GLUCOSE 76 09/06/2012 1057   BUN 37* 11/15/2013  BUN 23 01/31/2013 1101   CREATININE 2.0* 11/15/2013   CREATININE 1.9* 01/31/2013 1101   CALCIUM 8.2* 01/31/2013 1101   PROT 6.6 01/25/2013 1406   PROT 6.6 11/02/2011 1611   ALBUMIN 3.0* 01/25/2013 1406   AST 25 10/24/2013   ALT 20 10/24/2013   ALKPHOS 51 10/24/2013   BILITOT 0.5 01/25/2013 1406   GFRNONAA 33* 09/06/2012 1057   GFRAA 38* 09/06/2012 1057    Lipid Panel     Component Value Date/Time   CHOL 118 08/20/2013   TRIG 61 08/20/2013   HDL 54 08/20/2013   CHOLHDL 2 12/24/2008 0913   VLDL 14.2  12/24/2008 0913   LDLCALC 52 08/20/2013    Lab Results  Component Value Date   TSH 3.45 10/24/2013    Assessment & Plan 1. Chronic atrial fibrillation Stable. Rate controlled. Last dig level therapeutic. Continue digoxin 0.125mg  every other day and amiodarone 200mg  twice daily. Continue asa 81mg  daily and monitor.   2. Hypertensive heart disease with heart failure Stable. Continue catapres 0.1mg /24hr patch weekly, torsemide 20mg  daily, carvedilol 6.25mg  twice daily, and norvasc 10mg  daily. Continue to monitor   3. Chronic diastolic congestive heart failure Stable. Appears euvolemic on exam. Continue torsemide 20mg  daily and monitor  4. Hypothyroidism, unspecified hypothyroidism type Stable. Continue levothyroxine 76mcg daily and monitor   Family/Staff Communication Plan of care discuss with resident and professional staff members. Resident and professional staff members verbalize understanding and agree with plan of care. No additional questions or concerns reported.    Arthur Holms, MSN, AGNP-C [place], [place] [address] [phone] [8am-5pm] After hours: [phone]   I have personally reviewed this note and agree with the care plan  [facility], MD  [facility] [phone] (Monday-Friday 8 am - 5 pm) [phone] (afterhours)

## 2014-03-27 ENCOUNTER — Encounter: Payer: Self-pay | Admitting: *Deleted

## 2014-04-10 ENCOUNTER — Encounter: Payer: Self-pay | Admitting: Registered Nurse

## 2014-04-10 ENCOUNTER — Non-Acute Institutional Stay (SKILLED_NURSING_FACILITY): Payer: Medicare Other | Admitting: Registered Nurse

## 2014-04-10 DIAGNOSIS — G4700 Insomnia, unspecified: Secondary | ICD-10-CM

## 2014-04-10 NOTE — Progress Notes (Signed)
Patient ID: [name], female   DOB: Nov 01, 1923, 80 y.o.   MRN: [id number]   Place of Service: [facility]  Allergies  Allergen Reactions  . Naproxen     REACTION: gastritis    Code Status: Full Code  Goals of Care: Longevity/Long term care  Chief Complaint  Patient presents with  . Acute Visit    insomnia    HPI 80 y.o. female with PMH of afib, hypothyroidism, HTN, chronic diastolic HF among others is being seen for an acute visit for the evaluation of insomnia. Patient reported that she has trouble falling at sleep and would like to have something to help her sleep. She is currently taking 100mg  of trazodone and 6mg  of melatonin. No other concerns reported.   Review of Systems Constitutional: Negative for fever, chills, and fatigue. Eyes: Negative for eye pain and visual disturbance. Wears glasses  Cardiovascular: Negative for chest pain, palpitations.  Respiratory: Negative cough, shortness of breath, and wheezing.  Gastrointestinal: Negative for nausea and vomiting.  Musculoskeletal: Negative for back pain, joint pain, and joint swelling  Neurological: Negative for dizziness and headache.  Psychiatric: Negative for nervous/anxious, agitation, and depression  Past Medical History  Diagnosis Date  . Coronary artery disease     a. Acute MI 2003, stent to LCx. b. NSTEMI 2008 (cath w/o PCI).  c. +trop 02/2011 felt 2/2 demand ischemia.  [name] History of atrial flutter 2009    a. s/p ablation of typical flutter 09/2007.  [name] Paroxysmal atrial fibrillation     a. Dx 2009. b. Discontinuation of Coumadin 01/2008 2/2 hemarthrosis/chronic debilitation.  [name] COPD (chronic obstructive pulmonary disease)   . Hyperlipidemia   . Diabetes mellitus   . Diastolic congestive heart failure   . Arthritis   . Chronic kidney disease   . Carotid artery disease     a. Bilateral  . SSS (sick sinus syndrome)     a. Symptomatic bradycardia/syncope and post-termination pauses - St.  Jude dual chamber PPM 09/2007.   [name] Pneumonia   . Anxiety     adjustment disorder  . Pacemaker   . Mesenteric ischemia     a. Possible dx, 2013.  . Diverticula of colon     a. Colonoscopy 01/2012.  [name] PVD (peripheral vascular disease)     Carotid dz as noted, repoted aortoiliac occlusive dz, also reported high-grade stenosis at the origin & prox innominate artery, high-grade stenosis of the right carotid bifurcation, moderate stenosis of the proximal prevertebral left subclavian artery by CT 02/2011  . Pituitary tumor     s/p gamma knife  . Tachy-brady syndrome   . ?? Subclavian artery stenosis, right 04/04/2012  . SMA stenosis-moderate 04/04/2012  . Diverticulosis of colon (without mention of hemorrhage) 01/27/2012  . 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 CAROTID BRUIT, RIGHT 04/26/2007    Qualifier: Diagnosis of  By: Maxie Better FNP, Rosalita Levan   . Cardiac pacemaker in situ 09/15/2009    Qualifier: Diagnosis of  By: Lovena Le, MD, Martyn Malay   . GERD (gastroesophageal reflux disease) 08/09/2013  . Pressure ulcer, heel 06/28/2013    Past Surgical History  Procedure Laterality Date  . Cardiac catheterization      2003 cardiac stent  . Insert / replace / remove pacemaker  2009    St. Jude  . Cholecystectomy    . Total knee arthroplasty  bilateral  . Bilateral hip arthroscopy    . 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 Myocardial perfusion study      EF 59% 4/04  . Adenosine myoview study      abn EF 53% 12/04/04  . Carotid doppler  2/99  . Echo with lvh  1/98    old records  . Stress cardiolite  3/04    EF 53%  . Gamma knife radiosurg  08/25/06    [facility]  . Pvd      with stent in the right iliac   . Colonoscopy  01/27/2012    Procedure: COLONOSCOPY;  Surgeon: Inda Castle, MD;  Location: [facility];  Service: Endoscopy;  Laterality:  N/A;    History   Social History  . Marital Status: Widowed    Spouse Name: N/A    Number of Children: N/A  . Years of Education: N/A   Occupational History  . Not on file.   Social History Main Topics  . Smoking status: Current Every Day Smoker -- 0.25 packs/day for 40 years    Types: Cigarettes    Last Attempt to Quit: 04/02/2012  . Smokeless tobacco: Current User    Types: Snuff  . Alcohol Use: No  . Drug Use: No  . Sexual Activity: Not on file   Other Topics Concern  . Not on file   Social History Narrative   Widowed, husband died at age 89-DM, HTN. 3 children.    Nursing assistant in past, active in church.    Son lives with her and does most of the housework and cooking. 1/10   Pt signed party release form and gives Danise Edge, granddaughter [phone]), access to medical records. Can leave msg on machine [phone]) or cell.       Medication List       This list is accurate as of: 04/10/14  4:30 PM.  Always use your most recent med list.               albuterol 108 (90 BASE) MCG/ACT inhaler  Commonly known as:  PROVENTIL HFA;VENTOLIN HFA  Inhale 2 puffs into the lungs every 6 (six) hours as needed for wheezing or shortness of breath.     amiodarone 200 MG tablet  Commonly known as:  PACERONE  Take 1 tablet (200 mg total) by mouth 2 (two) times daily.     amLODipine 10 MG tablet  Commonly known as:  NORVASC  Take 5 mg by mouth daily.     aspirin 81 MG tablet  Take 81 mg by mouth daily.     carvedilol 25 MG tablet  Commonly known as:  COREG  Take 6.25 mg by mouth 2 (two) times daily with a meal.     citalopram 10 MG tablet  Commonly known as:  CELEXA  Take 2 tablets (20 mg total) by mouth daily.     cloNIDine 0.1 mg/24hr patch  Commonly known as:  CATAPRES - Dosed in mg/24 hr  Place 0.1 mg onto the skin once a week.     clopidogrel 75 MG tablet  Commonly known as:  PLAVIX  Take 75 mg by mouth daily with breakfast.     digoxin 0.125 MG  tablet  Commonly known as:  LANOXIN  Take 1 tablet (0.125 mg total) by mouth every other day.     docusate sodium 100 MG capsule  Commonly known as:  COLACE  Take  100 mg by mouth 2 (two) times daily.     ferrous sulfate 325 (65 FE) MG tablet  Take 325 mg by mouth daily with breakfast.     HYDROcodone-acetaminophen 5-325 MG per tablet  Commonly known as:  NORCO/VICODIN  Take one tablet by mouth every 4 hours as needed for pain     levothyroxine 50 MCG tablet  Commonly known as:  SYNTHROID, LEVOTHROID  Take 25 mcg by mouth daily before breakfast.     nitroGLYCERIN 0.4 MG SL tablet  Commonly known as:  NITROSTAT  Place 0.4 mg under the tongue every 5 (five) minutes as needed. For chest pain     polyethylene glycol packet  Commonly known as:  MIRALAX / GLYCOLAX  Take 17 g by mouth 2 (two) times daily.     ranitidine 150 MG capsule  Commonly known as:  ZANTAC  Take 150 mg by mouth every morning.     senna 8.6 MG Tabs tablet  Commonly known as:  SENOKOT  Take 1 tablet by mouth at bedtime.     Tiotropium Bromide Monohydrate 2.5 MCG/ACT Aers  Commonly known as:  SPIRIVA RESPIMAT  Inhale 2 Inhalers into the lungs daily. Use with aerochamber     torsemide 20 MG tablet  Commonly known as:  DEMADEX  Take 20 mg by mouth daily.     vitamin B-12 1000 MCG tablet  Commonly known as:  CYANOCOBALAMIN  Take 1,000 mcg by mouth daily.     zolpidem 5 MG tablet  Commonly known as:  AMBIEN  Take 5 mg by mouth at bedtime.        Physical Exam Filed Vitals:   04/10/14 1548  BP: 115/50  Pulse: 64  Temp: 96.4 F (35.8 C)  Resp: 18   Constitutional: WDWN elderly female in no acute distress. Pleasant and conversant HEENT: Normocephalic and atraumatic. PERRL. EOM intact. No icterus.  Neck:  No JVD or carotid bruits. Cardiac: Normal S1, S2. RRR with 1/6 systolic murmurs. Distal pulses intact.1+ pitting leg edema bilaterally.  Lungs: No respiratory distress. Breath sounds clear  bilaterally without rales, rhonchi, or wheezes. Abdomen: Audible bowel sounds in all quadrants. Soft, nontender, nondistended.   Musculoskeletal: Able to move all extremities. No joint erythema or tenderness.  Skin: Warm and dry. No rash noted. No erythema.  Neurological: Alert and oriented to person and place.  Psychiatric: Judgment and insight adequate. Appropriate mood and affect.   Labs Reviewed  01/04/14 digoxin level 1.4  CBC Latest Ref Rng 11/15/2013 01/25/2013 09/25/2012  WBC - 5.7 7.1 9.1  Hemoglobin 12.0 - 16.0 g/dL 11.0(A) 10.5(L) 11.6(L)  Hematocrit 36 - 46 % 35(A) 32.0(L) 35.1(L)  Platelets 150 - 399 K/L 397 393.0 317.0    CMP     Component Value Date/Time   NA 135* 11/15/2013   NA 136 01/31/2013 1101   K 4.4 11/15/2013   CL 100 01/31/2013 1101   CO2 32 01/31/2013 1101   GLUCOSE 142* 01/31/2013 1101   GLUCOSE 76 09/06/2012 1057   BUN 37* 11/15/2013   BUN 23 01/31/2013 1101   CREATININE 2.0* 11/15/2013   CREATININE 1.9* 01/31/2013 1101   CALCIUM 8.2* 01/31/2013 1101   PROT 6.6 01/25/2013 1406   PROT 6.6 11/02/2011 1611   ALBUMIN 3.0* 01/25/2013 1406   AST 25 10/24/2013   ALT 20 10/24/2013   ALKPHOS 51 10/24/2013   BILITOT 0.5 01/25/2013 1406   GFRNONAA 33* 09/06/2012 1057   GFRAA 38* 09/06/2012 1057  Lipid Panel     Component Value Date/Time   CHOL 118 08/20/2013   TRIG 61 08/20/2013   HDL 54 08/20/2013   CHOLHDL 2 12/24/2008 0913   VLDL 14.2 12/24/2008 0913   LDLCALC 52 08/20/2013    Lab Results  Component Value Date   TSH 3.45 10/24/2013    Assessment & Plan 1. Insomnia Will discontinue trazodone 100mg  daily at bedtime and melatonin 6mg  daily at bedtime. Start Ambien 5mg  daily at bedtime and continue to monitor.    Family/Staff Communication Plan of care discuss with resident and nursing staff. Resident and nursing staff verbalize understanding and agree with plan of care. No additional questions or concerns reported.    Arthur Holms,  MSN, AGNP-C [facility] [address] [place], [place] [address] [phone] [8am-5pm] After hours: [phone]

## 2014-04-15 ENCOUNTER — Other Ambulatory Visit: Payer: Self-pay | Admitting: *Deleted

## 2014-04-15 MED ORDER — ZOLPIDEM TARTRATE 5 MG PO TABS: ORAL_TABLET | ORAL | Status: DC

## 2014-04-15 NOTE — Telephone Encounter (Signed)
[facility] 

## 2014-04-19 ENCOUNTER — Non-Acute Institutional Stay (SKILLED_NURSING_FACILITY): Payer: Medicare Other | Admitting: Registered Nurse

## 2014-04-19 ENCOUNTER — Encounter: Payer: Self-pay | Admitting: Registered Nurse

## 2014-04-19 DIAGNOSIS — E785 Hyperlipidemia, unspecified: Secondary | ICD-10-CM

## 2014-04-19 DIAGNOSIS — J449 Chronic obstructive pulmonary disease, unspecified: Secondary | ICD-10-CM

## 2014-04-19 DIAGNOSIS — K219 Gastro-esophageal reflux disease without esophagitis: Secondary | ICD-10-CM

## 2014-04-19 DIAGNOSIS — I2510 Atherosclerotic heart disease of native coronary artery without angina pectoris: Secondary | ICD-10-CM

## 2014-04-19 DIAGNOSIS — F329 Major depressive disorder, single episode, unspecified: Secondary | ICD-10-CM

## 2014-04-19 DIAGNOSIS — I482 Chronic atrial fibrillation, unspecified: Secondary | ICD-10-CM

## 2014-04-19 DIAGNOSIS — F32A Depression, unspecified: Secondary | ICD-10-CM

## 2014-04-19 DIAGNOSIS — I10 Essential (primary) hypertension: Secondary | ICD-10-CM

## 2014-04-19 DIAGNOSIS — M159 Polyosteoarthritis, unspecified: Secondary | ICD-10-CM

## 2014-04-19 DIAGNOSIS — E119 Type 2 diabetes mellitus without complications: Secondary | ICD-10-CM

## 2014-04-19 DIAGNOSIS — I5032 Chronic diastolic (congestive) heart failure: Secondary | ICD-10-CM

## 2014-04-19 DIAGNOSIS — K5900 Constipation, unspecified: Secondary | ICD-10-CM

## 2014-04-19 DIAGNOSIS — G4700 Insomnia, unspecified: Secondary | ICD-10-CM

## 2014-04-19 DIAGNOSIS — E039 Hypothyroidism, unspecified: Secondary | ICD-10-CM

## 2014-04-19 NOTE — Progress Notes (Signed)
Patient ID: [name], female   DOB: Jul 14, 1923, 80 y.o.   MRN: [id number]   Place of Service: [facility]  Allergies  Allergen Reactions  . Naproxen     REACTION: gastritis    Code Status: Full Code  Goals of Care: Longevity/LTC  Chief Complaint  Patient presents with  . Medical Management of Chronic Issues    hypothyroidism, CAD, afib, anemia, insomnia, HTN, COPD, OA, HLD, depression    HPI 80 y.o. female with PMH of CAD, HTN, COPD, OA, HLD, depression, insomnia among others is being seen for a routine visit for management of her chronic issues. Weight stable. No recent fall or skin concerns reported. No change in behaviors or functional status reported. No concerns from staff. Patient reported still having troubling staying asleep, waking up about every couple hours at night. She is currently on ambien 5mg  daily at bedtime for her insomnia. Hypothyroidism is stable on current dose of levothyroxine. HTN is well-controlled on norvasc, coreg, catapres, and torsemide with BPs mostly in 140s/60s.  Review of Systems Constitutional: Negative for fever, chills, and fatigue. HENT: Negative for ear pain, congestion, and sore throat Eyes: Negative for eye pain, eye discharge, and visual disturbance  Cardiovascular: Negative for chest pain, palpitations, and leg swelling Respiratory: Negative cough, shortness of breath, and wheezing.  Gastrointestinal: Negative for nausea and vomiting. Negative for abdominal pain, diarrhea and constipation.  Genitourinary: Negative for  dysuria, frequency, urgency, and hematuria Endocrine: Negative for polydipsia, polyphagia, and polyuria Musculoskeletal: Negative for back pain, joint pain, and joint swelling  Neurological: Negative for dizziness and headache  Skin: Negative for rash and wound.   Psychiatric: Negative for depression.   Past Medical History  Diagnosis Date  . Coronary artery disease     a. Acute MI 2003, stent to LCx.  b. NSTEMI 2008 (cath w/o PCI).  c. +trop 02/2011 felt 2/2 demand ischemia.  [name] History of atrial flutter 2009    a. s/p ablation of typical flutter 09/2007.  [name] Paroxysmal atrial fibrillation     a. Dx 2009. b. Discontinuation of Coumadin 01/2008 2/2 hemarthrosis/chronic debilitation.  [name] COPD (chronic obstructive pulmonary disease)   . Hyperlipidemia   . Diabetes mellitus   . Diastolic congestive heart failure   . Arthritis   . Chronic kidney disease   . Carotid artery disease     a. Bilateral  . SSS (sick sinus syndrome)     a. Symptomatic bradycardia/syncope and post-termination pauses - St. Jude dual chamber PPM 09/2007.   [name] Pneumonia   . Anxiety     adjustment disorder  . Pacemaker   . Mesenteric ischemia     a. Possible dx, 2013.  . Diverticula of colon     a. Colonoscopy 01/2012.  [name] PVD (peripheral vascular disease)     Carotid dz as noted, repoted aortoiliac occlusive dz, also reported high-grade stenosis at the origin & prox innominate artery, high-grade stenosis of the right carotid bifurcation, moderate stenosis of the proximal prevertebral left subclavian artery by CT 02/2011  . Pituitary tumor     s/p gamma knife  . Tachy-brady syndrome   . ?? Subclavian artery stenosis, right 04/04/2012  . SMA stenosis-moderate 04/04/2012  . Diverticulosis of colon (without mention of hemorrhage) 01/27/2012  . 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 CAROTID BRUIT, RIGHT 04/26/2007    Qualifier: Diagnosis of  By: Maxie Better  FNP, Rosalita Levan   . Cardiac pacemaker in situ 09/15/2009    Qualifier: Diagnosis of  By: Lovena Le, MD, Martyn Malay   . GERD (gastroesophageal reflux disease) 08/09/2013  . Pressure ulcer, heel 06/28/2013    Past Surgical History  Procedure Laterality Date  . Cardiac catheterization      2003 cardiac stent  . Insert / replace / remove pacemaker  2009    St. Jude  .  Cholecystectomy    . Total knee arthroplasty      bilateral  . Bilateral hip arthroscopy    . 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 Myocardial perfusion study      EF 59% 4/04  . Adenosine myoview study      abn EF 53% 12/04/04  . Carotid doppler  2/99  . Echo with lvh  1/98    old records  . Stress cardiolite  3/04    EF 53%  . Gamma knife radiosurg  08/25/06    [facility]  . Pvd      with stent in the right iliac   . Colonoscopy  01/27/2012    Procedure: COLONOSCOPY;  Surgeon: Inda Castle, MD;  Location: [place];  Service: Endoscopy;  Laterality: N/A;    History   Social History  . Marital Status: Widowed    Spouse Name: N/A    Number of Children: N/A  . Years of Education: N/A   Occupational History  . Not on file.   Social History Main Topics  . Smoking status: Current Every Day Smoker -- 0.25 packs/day for 40 years    Types: Cigarettes    Last Attempt to Quit: 04/02/2012  . Smokeless tobacco: Current User    Types: Snuff  . Alcohol Use: No  . Drug Use: No  . Sexual Activity: Not on file   Other Topics Concern  . Not on file   Social History Narrative   Widowed, husband died at age 73-DM, HTN. 3 children.    Nursing assistant in past, active in church.    Son lives with her and does most of the housework and cooking. 1/10   Pt signed party release form and gives Danise Edge, granddaughter [phone]), access to medical records. Can leave msg on machine [phone]) or cell.       Medication List       This list is accurate as of: 04/19/14 11:59 PM.  Always use your most recent med list.               albuterol 108 (90 BASE) MCG/ACT inhaler  Commonly known as:  PROVENTIL HFA;VENTOLIN HFA  Inhale 2 puffs into the lungs every 6 (six) hours as needed for wheezing or shortness of breath.     amiodarone 200 MG tablet  Commonly known as:  PACERONE  Take 1 tablet (200 mg total) by mouth 2 (two) times daily.       amLODipine 10 MG tablet  Commonly known as:  NORVASC  Take 5 mg by mouth daily.     aspirin 81 MG tablet  Take 81 mg by mouth daily.     carvedilol 25 MG tablet  Commonly known as:  COREG  Take 6.25 mg by mouth 2 (two) times daily with a meal.     citalopram 10 MG tablet  Commonly known as:  CELEXA  Take 2 tablets (20 mg total) by mouth daily.     cloNIDine  0.1 mg/24hr patch  Commonly known as:  CATAPRES - Dosed in mg/24 hr  Place 0.1 mg onto the skin once a week.     clopidogrel 75 MG tablet  Commonly known as:  PLAVIX  Take 75 mg by mouth daily with breakfast.     digoxin 0.125 MG tablet  Commonly known as:  LANOXIN  Take 1 tablet (0.125 mg total) by mouth every other day.     docusate sodium 100 MG capsule  Commonly known as:  COLACE  Take 100 mg by mouth 2 (two) times daily.     ferrous sulfate 325 (65 FE) MG tablet  Take 325 mg by mouth daily with breakfast.     HYDROcodone-acetaminophen 5-325 MG per tablet  Commonly known as:  NORCO/VICODIN  Take one tablet by mouth every 4 hours as needed for pain     levothyroxine 50 MCG tablet  Commonly known as:  SYNTHROID, LEVOTHROID  Take 25 mcg by mouth daily before breakfast.     nitroGLYCERIN 0.4 MG SL tablet  Commonly known as:  NITROSTAT  Place 0.4 mg under the tongue every 5 (five) minutes as needed. For chest pain     polyethylene glycol packet  Commonly known as:  MIRALAX / GLYCOLAX  Take 17 g by mouth 2 (two) times daily.     ranitidine 150 MG capsule  Commonly known as:  ZANTAC  Take 150 mg by mouth every morning.     senna 8.6 MG Tabs tablet  Commonly known as:  SENOKOT  Take 1 tablet by mouth at bedtime.     Tiotropium Bromide Monohydrate 2.5 MCG/ACT Aers  Commonly known as:  SPIRIVA RESPIMAT  Inhale 2 Inhalers into the lungs daily. Use with aerochamber     torsemide 20 MG tablet  Commonly known as:  DEMADEX  Take 20 mg by mouth daily.     vitamin B-12 1000 MCG tablet  Commonly known as:   CYANOCOBALAMIN  Take 1,000 mcg by mouth daily.     zolpidem 5 MG tablet  Commonly known as:  AMBIEN  Take one tablet by mouth once at bedtime for rest        Physical Exam  BP 144/65 mmHg  Pulse 60  Temp(Src) 97.8 F (36.6 C)  Resp 18  Ht 5\' 5"  (1.651 m)  Wt 157 lb 12.8 oz (71.578 kg)  BMI 26.26 kg/m2  Constitutional: WDWN elderly female in no acute distress. Conversant and pleasant HEENT: Normocephalic and atraumatic. PERRL. EOM intact. No icterus. No nasal discharge or sinus tenderness. Oral mucosa moist. Posterior pharynx clear of any exudate or lesions.  Neck: Supple and nontender. No lymphadenopathy, masses, or thyromegaly. No JVD or carotid bruits. Cardiac: Normal S1, S2. RRR without appreciable murmurs, rubs, or gallops. Distal pulses intact. 1+ pitting edema of BLE Lungs: No respiratory distress. Breath sounds clear bilaterally without rales, rhonchi, or wheezes. Abdomen: Audible bowel sounds in all quadrants. Soft, nontender, nondistended. No palpable mass.  Musculoskeletal: able to move all extremities. No joint erythema or tenderness. Propel self in wheelchair.  Skin: Warm and dry. No rash noted. No erythema.  Neurological: Alert and oriented to person, place, and time.  Psychiatric: Judgment and insight adequate. Appropriate mood and affect.   Labs Reviewed  CBC Latest Ref Rng 11/15/2013 01/25/2013 09/25/2012  WBC - 5.7 7.1 9.1  Hemoglobin 12.0 - 16.0 g/dL 11.0(A) 10.5(L) 11.6(L)  Hematocrit 36 - 46 % 35(A) 32.0(L) 35.1(L)  Platelets 150 - 399 K/L 397 393.0 317.0  CMP Latest Ref Rng 11/15/2013 10/24/2013 01/31/2013  Glucose 70 - 99 mg/dL - - 142(H)  BUN 4 - 21 mg/dL 37(A) - 23  Creatinine 0.5 - 1.1 mg/dL 2.0(A) - 1.9(H)  Sodium 137 - 147 mmol/L 135(A) - 136  Potassium 3.4 - 5.3 mmol/L 4.4 - 4.0  Chloride 96 - 112 mEq/L - - 100  CO2 19 - 32 mEq/L - - 32  Calcium 8.4 - 10.5 mg/dL - - 8.2(L)  Total Protein 6.0 - 8.3 g/dL - - -  Albumin 3.5 - 4.7 g/dL - - -   Total Bilirubin 0.3 - 1.2 mg/dL - - -  Alkaline Phos 25 - 125 U/L - 51 -  AST 13 - 35 U/L - 25 -  ALT 7 - 35 U/L - 20 -    Lab Results  Component Value Date   HGBA1C 6.0 10/24/2013   HGBA1C 6.0 10/24/2013    Lab Results  Component Value Date   TSH 3.45 10/24/2013    Lipid Panel     Component Value Date/Time   CHOL 118 08/20/2013   TRIG 61 08/20/2013   HDL 54 08/20/2013   CHOLHDL 2 12/24/2008 0913   VLDL 14.2 12/24/2008 0913   LDLCALC 52 08/20/2013    Assessment & Plan 1. Insomnia Increase ambien from 5mg  to 10mg  daily at bedtime and continue to monitor.   2. Depression Stable. Continue celexa 20mg  daily and monitor.   3. Hyperlipidemia LDL at goal. Diet-controlled. Continue to monitor.   4. Chronic obstructive pulmonary disease, unspecified COPD, unspecified chronic bronchitis type Stable. Continue spiriva 2 puffs daily and albuterol hfa 2 puffs every six hours as needed for shortness of breath and wheezing. Continue to monitor.   5. HYPERTENSION, BENIGN ESSENTIAL Stable. Continue amlodipine 10mg  daily, carvidilol 6.25mg  twice daily, torsemide 20mg  daily, and catapres 0.1mg /24hr patch once a week. Continue to monitor.   6. Constipation, unspecified constipation type Stable. Continue colace 100mg  twice daily, sennakot 8.6mg  daily, and miralax 17g twice daily. Continue to monitor  7. Osteoarthritis  Stable. Continue norco 5/325mg  every four hours as needed for pain and monitor.   8. Controlled diabetes mellitus Last a1c 6.0. Currently not on medications. Diet-controlled. Continue to monitor a1c annually   9. Chronic diastolic congestive heart failure Euvolemic on exam. Continue torsemide 20mg  daily, coreg 6.25mg  twice daily, and digoxin 141mcg every other day. Continue to monitor  10. Chronic atrial fibrillation Stable. Last dig level therapeutic. Continue digoxin 116mcg every other day and amiodarone 200mg  twice daily. Continue asa 81mg  daily and monitor.  Will recheck dig level.   11. CAD No chest pain as of late. Continue asa 81mg  daily and plavix 75mg  daily. Continue SL nitro as needed for chest pain. LDL at goal and BP is adequately controlled on current regimen.  12. GERD Stable. Continue zantac 150mg  daily. Continue to monitor.   13. Hypothyroidism Stable. Continue levothyroxine 7mcg daily and monitor.    Diagnostic Studies/Labs Ordered: CBC, CMP, dig level, TSH  Family/Staff Communication Plan of care discussed with resident and nursing staff. Resident and nursing staff verbalized understanding and agree with plan of care. No additional questions or concerns reported.    Arthur Holms, MSN, AGNP-C [facility] [address] [place], [place] [address] [phone] [8am-5pm] After hours: [phone]

## 2014-04-22 ENCOUNTER — Other Ambulatory Visit: Payer: Self-pay | Admitting: *Deleted

## 2014-04-22 LAB — CBC AND DIFFERENTIAL
HEMATOCRIT: 35 % — AB (ref 36–46)
HEMOGLOBIN: 10.8 g/dL — AB (ref 12.0–16.0)
PLATELETS: 261 10*3/uL (ref 150–399)
WBC: 5.5 10^3/mL

## 2014-04-22 LAB — BASIC METABOLIC PANEL
BUN: 51 mg/dL — AB (ref 4–21)
Creatinine: 2.5 mg/dL — AB (ref 0.5–1.1)
Glucose: 154 mg/dL
Potassium: 4 mmol/L (ref 3.4–5.3)
Sodium: 139 mmol/L (ref 137–147)

## 2014-04-22 LAB — TSH: TSH: 4.22 u[IU]/mL (ref 0.41–5.90)

## 2014-04-22 MED ORDER — ZOLPIDEM TARTRATE 10 MG PO TABS: ORAL_TABLET | ORAL | Status: DC

## 2014-04-22 NOTE — Telephone Encounter (Signed)
[facility] 

## 2014-04-25 ENCOUNTER — Encounter (HOSPITAL_COMMUNITY): Payer: Self-pay | Admitting: Surgery

## 2014-05-20 ENCOUNTER — Non-Acute Institutional Stay (SKILLED_NURSING_FACILITY): Payer: Medicare Other | Admitting: Registered Nurse

## 2014-05-20 ENCOUNTER — Other Ambulatory Visit: Payer: Self-pay | Admitting: *Deleted

## 2014-05-20 DIAGNOSIS — G4700 Insomnia, unspecified: Secondary | ICD-10-CM

## 2014-05-20 DIAGNOSIS — N184 Chronic kidney disease, stage 4 (severe): Secondary | ICD-10-CM

## 2014-05-20 DIAGNOSIS — D631 Anemia in chronic kidney disease: Secondary | ICD-10-CM

## 2014-05-20 DIAGNOSIS — F329 Major depressive disorder, single episode, unspecified: Secondary | ICD-10-CM

## 2014-05-20 DIAGNOSIS — I5032 Chronic diastolic (congestive) heart failure: Secondary | ICD-10-CM

## 2014-05-20 DIAGNOSIS — I482 Chronic atrial fibrillation, unspecified: Secondary | ICD-10-CM

## 2014-05-20 DIAGNOSIS — K219 Gastro-esophageal reflux disease without esophagitis: Secondary | ICD-10-CM

## 2014-05-20 DIAGNOSIS — I10 Essential (primary) hypertension: Secondary | ICD-10-CM

## 2014-05-20 DIAGNOSIS — E785 Hyperlipidemia, unspecified: Secondary | ICD-10-CM

## 2014-05-20 DIAGNOSIS — K5900 Constipation, unspecified: Secondary | ICD-10-CM

## 2014-05-20 DIAGNOSIS — M159 Polyosteoarthritis, unspecified: Secondary | ICD-10-CM

## 2014-05-20 DIAGNOSIS — F32A Depression, unspecified: Secondary | ICD-10-CM

## 2014-05-20 DIAGNOSIS — J449 Chronic obstructive pulmonary disease, unspecified: Secondary | ICD-10-CM

## 2014-05-20 DIAGNOSIS — E039 Hypothyroidism, unspecified: Secondary | ICD-10-CM

## 2014-05-20 DIAGNOSIS — N189 Chronic kidney disease, unspecified: Secondary | ICD-10-CM

## 2014-05-20 DIAGNOSIS — I2510 Atherosclerotic heart disease of native coronary artery without angina pectoris: Secondary | ICD-10-CM

## 2014-05-20 MED ORDER — ZOLPIDEM TARTRATE 10 MG PO TABS: ORAL_TABLET | ORAL | Status: DC

## 2014-05-20 NOTE — Progress Notes (Signed)
Patient ID: [name], female   DOB: [birth year], 81 y.o.   MRN: [id number]   Place of Service: [facility]  Allergies  Allergen Reactions  . Naproxen     REACTION: gastritis    Code Status: Full Code  Goals of Care: Longevity/LTC  Chief Complaint  Patient presents with  . Medical Management of Chronic Issues    insomnia, depression, HLD, COPD, HTN, constipation, GERD, hypothyroidism, CAD, OA    HPI 81 y.o. female with PMH of CAD, HTN, COPD, OA, HLD, depression, insomnia among others is being seen for a routine visit for management of her chronic issues. Weight stable. No recent fall or skin concerns reported. No change in behaviors or functional status reported. No concerns from staff. Patient reported still having troubling with falling and staying asleep. Per nursing staff, she is sleeping soundly at night. Hypothyroidism is stable on current dose of levothyroxine. HTN is well-controlled on norvasc, coreg, catapres, and torsemide with BPs mostly in 120s-140s/60-80s. Depression is stable on celexa. CHF stable on coreg, digoxin, and torsemide, no recent exacerbation. COPD stable on spiriva and albuterol prn. No other complaints verbalized by patient. No concerns reported from nursing staff. See in wheelchair today.   Review of Systems Constitutional: Negative for fever, chills, and fatigue. HENT: Negative for ear pain, congestion, and sore throat Eyes: Negative for eye pain, eye discharge, and visual disturbance  Cardiovascular: Negative for chest pain, palpitations. Positive for leg swelling Respiratory: Negative cough, shortness of breath, and wheezing.  Gastrointestinal: Negative for nausea and vomiting. Negative for abdominal pain, diarrhea and constipation.  Genitourinary: Negative for  dysuria, frequency, urgency, and hematuria Endocrine: Negative for polydipsia, polyphagia, and polyuria Musculoskeletal: Negative for back pain, joint pain, and joint swelling    Neurological: Negative for dizziness and headache  Skin: Negative for rash and wound.   Psychiatric: Negative for depression.   Past Medical History  Diagnosis Date  . Coronary artery disease     a. Acute MI 2003, stent to LCx. b. NSTEMI 2008 (cath w/o PCI).  c. +trop 02/2011 felt 2/2 demand ischemia.  [name] History of atrial flutter 2009    a. s/p ablation of typical flutter 09/2007.  [name] Paroxysmal atrial fibrillation     a. Dx 2009. b. Discontinuation of Coumadin 01/2008 2/2 hemarthrosis/chronic debilitation.  [name] COPD (chronic obstructive pulmonary disease)   . Hyperlipidemia   . Diabetes mellitus   . Diastolic congestive heart failure   . Arthritis   . Chronic kidney disease   . Carotid artery disease     a. Bilateral  . SSS (sick sinus syndrome)     a. Symptomatic bradycardia/syncope and post-termination pauses - St. Jude dual chamber PPM 09/2007.   [name] Pneumonia   . Anxiety     adjustment disorder  . Pacemaker   . Mesenteric ischemia     a. Possible dx, 2013.  . Diverticula of colon     a. Colonoscopy 01/2012.  [name] PVD (peripheral vascular disease)     Carotid dz as noted, repoted aortoiliac occlusive dz, also reported high-grade stenosis at the origin & prox innominate artery, high-grade stenosis of the right carotid bifurcation, moderate stenosis of the proximal prevertebral left subclavian artery by CT 02/2011  . Pituitary tumor     s/p gamma knife  . Tachy-brady syndrome   . ?? Subclavian artery stenosis, right 04/04/2012  . SMA stenosis-moderate 04/04/2012  . Diverticulosis of colon (without mention of hemorrhage) 01/27/2012  . history of PITUITARY  ADENOMA- s/p gamma knife 10/27/2006    Qualifier: Diagnosis of  By: Maxie Better FNP, Rosalita Levan   . CAROTID BRUIT, RIGHT 04/26/2007    Qualifier: Diagnosis of  By: Maxie Better FNP, Rosalita Levan   . Cardiac pacemaker in situ 09/15/2009    Qualifier: Diagnosis of  By: Lovena Le, MD, Martyn Malay   . GERD (gastroesophageal reflux  disease) 08/09/2013  . Pressure ulcer, heel 06/28/2013    Past Surgical History  Procedure Laterality Date  . Cardiac catheterization      2003 cardiac stent  . Insert / replace / remove pacemaker  2009    St. Jude  . Cholecystectomy    . Total knee arthroplasty      bilateral  . Bilateral hip arthroscopy    . 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 Myocardial perfusion study      EF 59% 4/04  . Adenosine myoview study      abn EF 53% 12/04/04  . Carotid doppler  2/99  . Echo with lvh  1/98    old records  . Stress cardiolite  3/04    EF 53%  . Gamma knife radiosurg  08/25/06    [facility]  . Pvd      with stent in the right iliac   . Colonoscopy  01/27/2012    Procedure: COLONOSCOPY;  Surgeon: Inda Castle, MD;  Location: [facility];  Service: Endoscopy;  Laterality: N/A;  . Abdominal aortagram N/A 01/25/2012    Procedure: ABDOMINAL AORTAGRAM;  Surgeon: Serafina Mitchell, MD;  Location[facility] C[facility] LAB;  Service: Cardiovascular;  Laterality: N/A;    History   Social History  . Marital Status: Widowed    Spouse Name: N/A    Number of Children: N/A  . Years of Education: N/A   Occupational History  . Not on file.   Social History Main Topics  . Smoking status: Current Every Day Smoker -- 0.25 packs/day for 40 years    Types: Cigarettes    Last Attempt to Quit: 04/02/2012  . Smokeless tobacco: Current User    Types: Snuff  . Alcohol Use: No  . Drug Use: No  . Sexual Activity: Not on file   Other Topics Concern  . Not on file   Social History Narrative   Widowed, husband died at age 55-DM, HTN. 3 children.    Nursing assistant in past, active in church.    Son lives with her and does most of the housework and cooking. 1/10   Pt signed party release form and gives Danise Edge, granddaughter [phone]), access to medical records. Can leave msg on machine [phone]) or cell.       Medication List       This list is  accurate as of: 05/20/14  9:04 PM.  Always use your most recent med list.               albuterol 108 (90 BASE) MCG/ACT inhaler  Commonly known as:  PROVENTIL HFA;VENTOLIN HFA  Inhale 2 puffs into the lungs every 6 (six) hours as needed for wheezing or shortness of breath.     amiodarone 200 MG tablet  Commonly known as:  PACERONE  Take 1 tablet (200 mg total) by mouth 2 (two) times daily.     amLODipine 10 MG tablet  Commonly known as:  NORVASC  Take 10 mg by mouth daily.     aspirin 81 MG tablet  Take 81 mg by mouth daily.     carvedilol 25 MG tablet  Commonly known as:  COREG  Take 6.25 mg by mouth 2 (two) times daily with a meal.     citalopram 10 MG tablet  Commonly known as:  CELEXA  Take 2 tablets (20 mg total) by mouth daily.     cloNIDine 0.1 mg/24hr patch  Commonly known as:  CATAPRES - Dosed in mg/24 hr  Place 0.1 mg onto the skin once a week.     clopidogrel 75 MG tablet  Commonly known as:  PLAVIX  Take 75 mg by mouth daily with breakfast.     digoxin 0.125 MG tablet  Commonly known as:  LANOXIN  Take 1 tablet (0.125 mg total) by mouth every other day.     docusate sodium 100 MG capsule  Commonly known as:  COLACE  Take 100 mg by mouth 2 (two) times daily.     ferrous sulfate 325 (65 FE) MG tablet  Take 325 mg by mouth daily with breakfast.     HYDROcodone-acetaminophen 5-325 MG per tablet  Commonly known as:  NORCO/VICODIN  Take one tablet by mouth every 4 hours as needed for pain     levothyroxine 50 MCG tablet  Commonly known as:  SYNTHROID, LEVOTHROID  Take 25 mcg by mouth daily before breakfast.     nitroGLYCERIN 0.4 MG SL tablet  Commonly known as:  NITROSTAT  Place 0.4 mg under the tongue every 5 (five) minutes as needed. For chest pain     polyethylene glycol packet  Commonly known as:  MIRALAX / GLYCOLAX  Take 17 g by mouth 2 (two) times daily.     ranitidine 150 MG capsule  Commonly known as:  ZANTAC  Take 150 mg by mouth every  morning.     senna 8.6 MG Tabs tablet  Commonly known as:  SENOKOT  Take 1 tablet by mouth at bedtime.     Tiotropium Bromide Monohydrate 2.5 MCG/ACT Aers  Commonly known as:  SPIRIVA RESPIMAT  Inhale 2 Inhalers into the lungs daily. Use with aerochamber     torsemide 20 MG tablet  Commonly known as:  DEMADEX  Take 20 mg by mouth daily.     vitamin B-12 1000 MCG tablet  Commonly known as:  CYANOCOBALAMIN  Take 1,000 mcg by mouth daily.     zolpidem 5 MG tablet  Commonly known as:  AMBIEN  Take 5 mg by mouth at bedtime.        Physical Exam  BP 130/70 mmHg  Pulse 68  Temp(Src) 97 F (36.1 C)  Resp 18  Ht 5' 5"  (1.651 m)  Wt 157 lb 12.8 oz (71.578 kg)  BMI 26.26 kg/m2  Constitutional: WDWN elderly female in no acute distress. Conversant and pleasant HEENT: Normocephalic and atraumatic. PERRL. EOM intact. No icterus. No nasal discharge or sinus tenderness. Oral mucosa moist. Posterior pharynx clear of any exudate or lesions.  Neck: Supple and nontender. No lymphadenopathy, masses, or thyromegaly. No JVD or carotid bruits. Cardiac: Normal S1, S2. RRR without appreciable murmurs, rubs, or gallops. Distal pulses intact. 1+ pitting edema of BLE Lungs: No respiratory distress. Breath sounds clear bilaterally without rales, rhonchi, or wheezes. Abdomen: Audible bowel sounds in all quadrants. Soft, nontender, nondistended. No palpable mass.  Musculoskeletal: able to move all extremities. No joint erythema or tenderness. Propel self in wheelchair.  Skin: Warm and dry. No rash noted. No erythema.  Neurological: Alert and oriented to  person, place, and time.  Psychiatric: Judgment and insight adequate. Appropriate mood and affect.   Labs Reviewed  CBC Latest Ref Rng 04/22/2014 11/15/2013 01/25/2013  WBC - 5.5 5.7 7.1  Hemoglobin 12.0 - 16.0 g/dL 10.8(A) 11.0(A) 10.5(L)  Hematocrit 36 - 46 % 35(A) 35(A) 32.0(L)  Platelets 150 - 399 K/L 261 397 393.0    CMP Latest Ref Rng  04/22/2014 11/15/2013 10/24/2013  Glucose 70 - 99 mg/dL - - -  BUN 4 - 21 mg/dL 51(A) 37(A) -  Creatinine 0.5 - 1.1 mg/dL 2.5(A) 2.0(A) -  Sodium 137 - 147 mmol/L 139 135(A) -  Potassium 3.4 - 5.3 mmol/L 4.0 4.4 -  Chloride 96 - 112 mEq/L - - -  CO2 19 - 32 mEq/L - - -  Calcium 8.4 - 10.5 mg/dL - - -  Total Protein 6.0 - 8.3 g/dL - - -  Albumin 3.5 - 4.7 g/dL - - -  Total Bilirubin 0.3 - 1.2 mg/dL - - -  Alkaline Phos 25 - 125 U/L - - 51  AST 13 - 35 U/L - - 25  ALT 7 - 35 U/L - - 20    Lab Results  Component Value Date   HGBA1C 6.0 10/24/2013   HGBA1C 6.0 10/24/2013    Lab Results  Component Value Date   TSH 4.22 04/22/2014    Lipid Panel     Component Value Date/Time   CHOL 118 08/20/2013   TRIG 61 08/20/2013   HDL 54 08/20/2013   CHOLHDL 2 12/24/2008 0913   VLDL 14.2 12/24/2008 0913   LDLCALC 52 08/20/2013   04/22/14. Dig level 1.0  Assessment & Plan 1. Insomnia Will decrease ambien from 51m to 543mat bedtime as higher dosage doesn't make a big difference in her insomnia. Will continue to monitor  2. Depression Stable. Continue celexa 2051maily and monitor for change in mood  3. Hyperlipidemia LDL at goal. Diet-controlled. Continue to monitor.   4. Chronic obstructive pulmonary disease, unspecified COPD, unspecified chronic bronchitis type Stable. No recent exacerbations. Continue spiriva 2 puffs daily and albuterol hfa 2 puffs every six hours as needed for shortness of breath and wheezing. Continue to monitor.   5. HYPERTENSION, BENIGN ESSENTIAL Stable. Continue amlodipine 19m23mily, carvidilol 6.25mg63mce daily, torsemide 20mg 15my, and catapres 0.1mg/2474mpatch once a week. Continue to monitor bp.    6. Constipation, unspecified constipation type Stable. Continue colace 100mg tw54mdaily, sennakot 8.6mg dail29mand miralax 17g twice daily. Continue to monitor  7. Osteoarthritis  Stable. Continue norco 5/325mg ever59mur hours as needed for pain and  monitor.   8. Anemia  Most likely in the setting of CKD. Last hgb 10.8. Continue ferrous sulfate 325mg daily67mnitor h&h  9. Chronic diastolic congestive heart failure Euvolemic on exam. Continue torsemide 20mg daily,9meg 6.25mg twice d61m, and digoxin 125mcg every o83m day. Continue to monitor  10. Chronic atrial fibrillation Stable. Last dig level therapeutic. Continue digoxin 125mcg every ot33mday and amiodarone 200mg twice dail38montinue asa 81mg daily and m71mor.    11. CAD Remains chest pain free. Continue asa 81mg daily and pl14m 75mg daily. Contin18mL nitro as needed for chest pain. LDL at goal and BP is adequately controlled on current regimen. Continue to monitor her status  12. GERD Stable. Continue zantac 150mg daily and moni29m 13. Hypothyroidism Stable. Continue levothyroxine 25mcg daily and moni86m   14. CKD, stage 4 Recent eGFR 23. Recheck bmp. Nephrology referral. Continue  to monitor her status.    Diagnostic Studies/Labs Ordered: bmp  Family/Staff Communication Plan of care discussed with resident and nursing staff. Resident and nursing staff verbalized understanding and agree with plan of care. No additional questions or concerns reported.    Arthur Holms, MSN, AGNP-C [facility] [address] [place], [place] [address] [phone] [8am-5pm] After hours: [phone]

## 2014-05-20 NOTE — Telephone Encounter (Signed)
[facility] 

## 2014-05-21 ENCOUNTER — Other Ambulatory Visit: Payer: Self-pay | Admitting: *Deleted

## 2014-05-21 MED ORDER — ZOLPIDEM TARTRATE 5 MG PO TABS: ORAL_TABLET | ORAL | Status: DC

## 2014-05-21 NOTE — Telephone Encounter (Signed)
[facility] 

## 2014-05-22 ENCOUNTER — Other Ambulatory Visit: Payer: Self-pay | Admitting: *Deleted

## 2014-05-22 LAB — BASIC METABOLIC PANEL
BUN: 55 mg/dL — AB (ref 4–21)
CREATININE: 2.4 mg/dL — AB (ref 0.5–1.1)
Glucose: 100 mg/dL
Potassium: 3.9 mmol/L (ref 3.4–5.3)
SODIUM: 137 mmol/L (ref 137–147)

## 2014-06-03 IMAGING — CR DG CHEST 1V PORT
1 series · 1 of 1 positions shown · non-contrast
Comparison: none

REASON FOR EXAM: SOB
COMMENTS:

[ap]
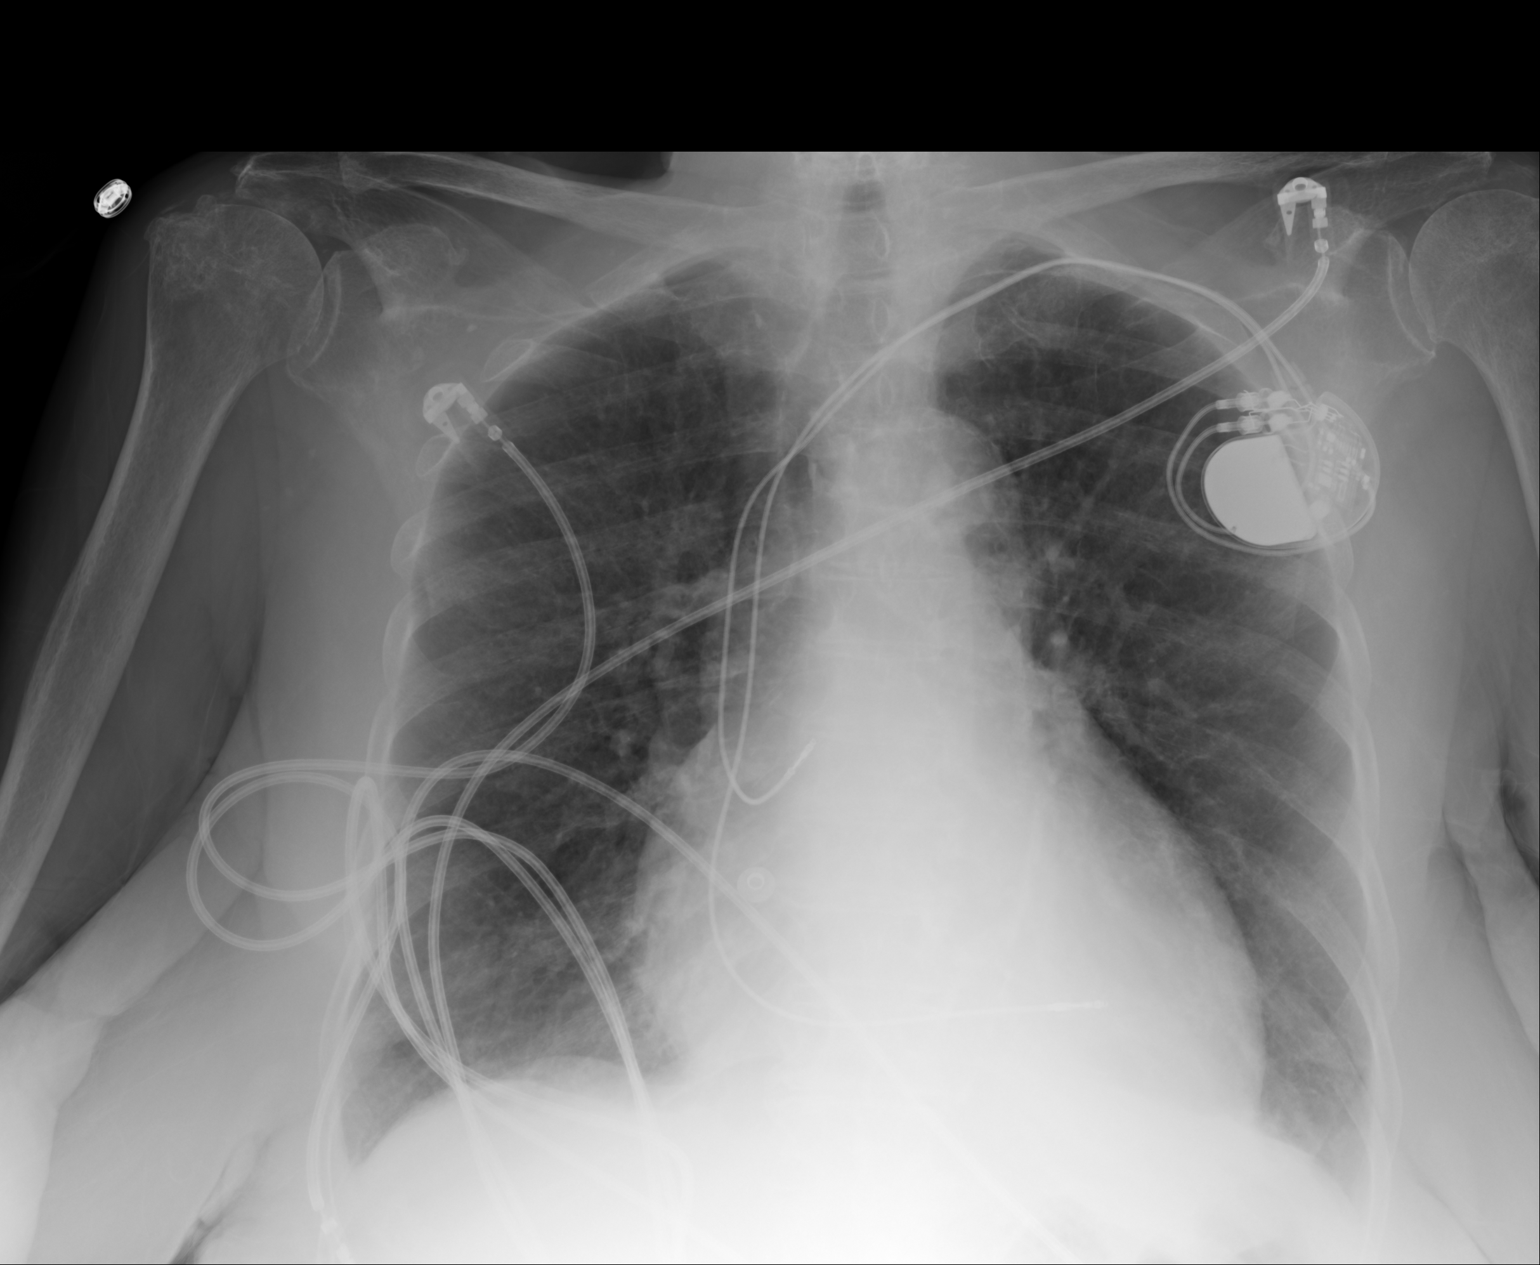

[1 of 1 positions shown; findings below may reference images not displayed]

PROCEDURE:     DXR - DXR PORTABLE CHEST SINGLE VIEW  - February 13, 2013 [DATE]

RESULT:     Comparison is made to the study February 09, 2013.

The lungs are mildly hyperinflated. There is no focal infiltrate. The
cardiac silhouette is mildly enlarged. A permanent pacemaker is in place.
The pulmonary vascularity is not engorged. There is no pleural effusion. The
observed portions of the bony thorax exhibit degenerative changes of the
right shoulder. No acute rib abnormality is demonstrated.
IMPRESSION: The findings are consistent with COPD. I cannot exclude
low-grade compensated CHF in the appropriate clinical setting.

[REDACTED]

## 2014-06-14 ENCOUNTER — Other Ambulatory Visit: Payer: Self-pay | Admitting: *Deleted

## 2014-06-14 MED ORDER — HYDROCODONE-ACETAMINOPHEN 5-325 MG PO TABS: ORAL_TABLET | ORAL | Status: DC

## 2014-06-14 NOTE — Telephone Encounter (Signed)
[facility] 

## 2014-06-18 ENCOUNTER — Non-Acute Institutional Stay (SKILLED_NURSING_FACILITY): Payer: Medicare Other | Admitting: Registered Nurse

## 2014-06-18 ENCOUNTER — Encounter: Payer: Self-pay | Admitting: Registered Nurse

## 2014-06-18 DIAGNOSIS — J449 Chronic obstructive pulmonary disease, unspecified: Secondary | ICD-10-CM

## 2014-06-18 DIAGNOSIS — I482 Chronic atrial fibrillation, unspecified: Secondary | ICD-10-CM

## 2014-06-18 DIAGNOSIS — E039 Hypothyroidism, unspecified: Secondary | ICD-10-CM

## 2014-06-18 DIAGNOSIS — N184 Chronic kidney disease, stage 4 (severe): Secondary | ICD-10-CM

## 2014-06-18 DIAGNOSIS — M159 Polyosteoarthritis, unspecified: Secondary | ICD-10-CM

## 2014-06-18 DIAGNOSIS — G4700 Insomnia, unspecified: Secondary | ICD-10-CM

## 2014-06-18 DIAGNOSIS — F329 Major depressive disorder, single episode, unspecified: Secondary | ICD-10-CM

## 2014-06-18 DIAGNOSIS — I10 Essential (primary) hypertension: Secondary | ICD-10-CM

## 2014-06-18 DIAGNOSIS — I2510 Atherosclerotic heart disease of native coronary artery without angina pectoris: Secondary | ICD-10-CM

## 2014-06-18 DIAGNOSIS — N189 Chronic kidney disease, unspecified: Secondary | ICD-10-CM

## 2014-06-18 DIAGNOSIS — I5032 Chronic diastolic (congestive) heart failure: Secondary | ICD-10-CM

## 2014-06-18 DIAGNOSIS — K5900 Constipation, unspecified: Secondary | ICD-10-CM

## 2014-06-18 DIAGNOSIS — D631 Anemia in chronic kidney disease: Secondary | ICD-10-CM

## 2014-06-18 DIAGNOSIS — K219 Gastro-esophageal reflux disease without esophagitis: Secondary | ICD-10-CM

## 2014-06-18 DIAGNOSIS — F32A Depression, unspecified: Secondary | ICD-10-CM

## 2014-06-18 NOTE — Progress Notes (Signed)
Patient ID: [name], female   DOB: 1924-01-16, 81 y.o.   MRN: [id number]   Place of Service: [facility]  Allergies  Allergen Reactions  . Naproxen     REACTION: gastritis    Code Status: Full Code  Goals of Care: Longevity/LTC  Chief Complaint  Patient presents with  . Medical Management of Chronic Issues    afib, insomnia, OA, CHF, HTN, HLD, depression, hypothyroidism, copd, cad    HPI 81 y.o. female with PMH of CAD, HTN, COPD, OA, HLD, depression, insomnia among others is being seen for a routine visit for management of her chronic issues. Weight overall stable. No recent fall or skin concerns reported. No change in behaviors or functional status reported. No concerns from staff. Patient reported still having troubling with falling and staying asleep, but is better than before. Hypothyroidism is stable on current dose of levothyroxine. HTN is well-controlled on norvasc, coreg, catapres, and torsemide with BPs mostly in 120s-140s/60-80s. Mood is stable on celexa. CHF stable on coreg, digoxin, and torsemide, no recent exacerbation. COPD stable on spiriva and albuterol prn. OA is stable on norco prn. Seen in wheelchair today. Reported having bil heel pain, especially with movement. No other complaints reported.   Review of Systems Constitutional: Negative for fever, chills, and fatigue. HENT: Negative for ear pain, congestion, and sore throat Eyes: Negative for eye pain, eye discharge, and visual disturbance  Cardiovascular: Negative for chest pain, palpitations. Positive for leg swelling Respiratory: Negative cough, shortness of breath, and wheezing.  Gastrointestinal: Negative for nausea and vomiting. Negative for abdominal pain, diarrhea and constipation.  Genitourinary: Negative for  dysuria and hematuria Endocrine: Negative for polydipsia, polyphagia, and polyuria Musculoskeletal: Negative for back pain. Positive for bil heel pain Neurological: Negative for  dizziness and headache  Skin: Negative for rash and wound.   Psychiatric: Negative for depression. Positive for insomnia  Past Medical History  Diagnosis Date  . Coronary artery disease     a. Acute MI 2003, stent to LCx. b. NSTEMI 2008 (cath w/o PCI).  c. +trop 02/2011 felt 2/2 demand ischemia.  [name] History of atrial flutter 2009    a. s/p ablation of typical flutter 09/2007.  [name] Paroxysmal atrial fibrillation     a. Dx 2009. b. Discontinuation of Coumadin 01/2008 2/2 hemarthrosis/chronic debilitation.  [name] COPD (chronic obstructive pulmonary disease)   . Hyperlipidemia   . Diabetes mellitus   . Diastolic congestive heart failure   . Arthritis   . Chronic kidney disease   . Carotid artery disease     a. Bilateral  . SSS (sick sinus syndrome)     a. Symptomatic bradycardia/syncope and post-termination pauses - St. Jude dual chamber PPM 09/2007.   [name] Pneumonia   . Anxiety     adjustment disorder  . Pacemaker   . Mesenteric ischemia     a. Possible dx, 2013.  . Diverticula of colon     a. Colonoscopy 01/2012.  [name] PVD (peripheral vascular disease)     Carotid dz as noted, repoted aortoiliac occlusive dz, also reported high-grade stenosis at the origin & prox innominate artery, high-grade stenosis of the right carotid bifurcation, moderate stenosis of the proximal prevertebral left subclavian artery by CT 02/2011  . Pituitary tumor     s/p gamma knife  . Tachy-brady syndrome   . ?? Subclavian artery stenosis, right 04/04/2012  . SMA stenosis-moderate 04/04/2012  . Diverticulosis of colon (without mention of hemorrhage) 01/27/2012  . 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 CAROTID BRUIT, RIGHT 04/26/2007    Qualifier: Diagnosis of  By: Maxie Better FNP, Rosalita Levan   . Cardiac pacemaker in situ 09/15/2009    Qualifier: Diagnosis of  By: Lovena Le, MD, Martyn Malay   . GERD (gastroesophageal reflux disease)  08/09/2013  . Pressure ulcer, heel 06/28/2013    Past Surgical History  Procedure Laterality Date  . Cardiac catheterization      2003 cardiac stent  . Insert / replace / remove pacemaker  2009    St. Jude  . Cholecystectomy    . Total knee arthroplasty      bilateral  . Bilateral hip arthroscopy    . 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 Myocardial perfusion study      EF 59% 4/04  . Adenosine myoview study      abn EF 53% 12/04/04  . Carotid doppler  2/99  . Echo with lvh  1/98    old records  . Stress cardiolite  3/04    EF 53%  . Gamma knife radiosurg  08/25/06    [facility]  . Pvd      with stent in the right iliac   . Colonoscopy  01/27/2012    Procedure: COLONOSCOPY;  Surgeon: Inda Castle, MD;  Location: [place];  Service: Endoscopy;  Laterality: N/A;  . Abdominal aortagram N/A 01/25/2012    Procedure: ABDOMINAL AORTAGRAM;  Surgeon: Serafina Mitchell, MD;  Location: [facility] CATH LAB;  Service: Cardiovascular;  Laterality: N/A;    History   Social History  . Marital Status: Widowed    Spouse Name: N/A    Number of Children: N/A  . Years of Education: N/A   Occupational History  . Not on file.   Social History Main Topics  . Smoking status: Current Every Day Smoker -- 0.25 packs/day for 40 years    Types: Cigarettes    Last Attempt to Quit: 04/02/2012  . Smokeless tobacco: Current User    Types: Snuff  . Alcohol Use: No  . Drug Use: No  . Sexual Activity: Not on file   Other Topics Concern  . Not on file   Social History Narrative   Widowed, husband died at age 43-DM, HTN. 3 children.    Nursing assistant in past, active in church.    Son lives with her and does most of the housework and cooking. 1/10   Pt signed party release form and gives Danise Edge, granddaughter [phone]), access to medical records. Can leave msg on machine [phone]) or cell.       Medication List       This list is accurate as  of: 06/18/14 11:31 PM.  Always use your most recent med list.               albuterol 108 (90 BASE) MCG/ACT inhaler  Commonly known as:  PROVENTIL HFA;VENTOLIN HFA  Inhale 2 puffs into the lungs every 6 (six) hours as needed for wheezing or shortness of breath.     amiodarone 200 MG tablet  Commonly known as:  PACERONE  Take 1 tablet (200 mg total) by mouth 2 (two) times daily.     amLODipine 10 MG tablet  Commonly known as:  NORVASC  Take 10 mg by mouth daily.     aspirin 81 MG tablet  Take 81 mg by mouth daily.     carvedilol 25 MG tablet  Commonly known as:  COREG  Take 6.25 mg by mouth 2 (two) times daily with a meal.     citalopram 10 MG tablet  Commonly known as:  CELEXA  Take 2 tablets (20 mg total) by mouth daily.     cloNIDine 0.1 mg/24hr patch  Commonly known as:  CATAPRES - Dosed in mg/24 hr  Place 0.1 mg onto the skin once a week.     clopidogrel 75 MG tablet  Commonly known as:  PLAVIX  Take 75 mg by mouth daily with breakfast.     digoxin 0.125 MG tablet  Commonly known as:  LANOXIN  Take 1 tablet (0.125 mg total) by mouth every other day.     docusate sodium 100 MG capsule  Commonly known as:  COLACE  Take 100 mg by mouth 2 (two) times daily.     ferrous sulfate 325 (65 FE) MG tablet  Take 325 mg by mouth daily with breakfast.     HYDROcodone-acetaminophen 5-325 MG per tablet  Commonly known as:  NORCO/VICODIN  Take one tablet by mouth every 4 hours as needed for pain     levothyroxine 50 MCG tablet  Commonly known as:  SYNTHROID, LEVOTHROID  Take 25 mcg by mouth daily before breakfast.     nitroGLYCERIN 0.4 MG SL tablet  Commonly known as:  NITROSTAT  Place 0.4 mg under the tongue every 5 (five) minutes as needed. For chest pain     polyethylene glycol packet  Commonly known as:  MIRALAX / GLYCOLAX  Take 17 g by mouth 2 (two) times daily.     ranitidine 150 MG capsule  Commonly known as:  ZANTAC  Take 150 mg by mouth every morning.      senna 8.6 MG Tabs tablet  Commonly known as:  SENOKOT  Take 1 tablet by mouth at bedtime.     Tiotropium Bromide Monohydrate 2.5 MCG/ACT Aers  Commonly known as:  SPIRIVA RESPIMAT  Inhale 2 Inhalers into the lungs daily. Use with aerochamber     torsemide 20 MG tablet  Commonly known as:  DEMADEX  Take 20 mg by mouth daily.     vitamin B-12 1000 MCG tablet  Commonly known as:  CYANOCOBALAMIN  Take 1,000 mcg by mouth daily.     zolpidem 6.25 MG CR tablet  Commonly known as:  AMBIEN CR  Take 6.25 mg by mouth at bedtime.        Physical Exam  BP 132/60 mmHg  Pulse 76  Temp(Src) 98.2 F (36.8 C)  Resp 18  Ht 5' 5"  (1.651 m)  Wt 154 lb 11.2 oz (70.171 kg)  BMI 25.74 kg/m2  SpO2 95%  Constitutional: WDWN elderly female in no acute distress. Conversant and pleasant HEENT: Normocephalic and atraumatic. PERRL. EOM intact. No icterus. No nasal discharge or sinus tenderness. Oral mucosa moist. Posterior pharynx clear of any exudate or lesions.  Neck: Supple and nontender. No lymphadenopathy, masses, or thyromegaly. No JVD or carotid bruits. Cardiac: Normal S1, S2. Irregularly irregular without appreciable murmurs, rubs, or gallops. Distal pulses intact. 1+ pitting edema of BLE Lungs: No respiratory distress. Breath sounds clear bilaterally without rales, rhonchi, or wheezes. Abdomen: Audible bowel sounds in all quadrants. Soft, nontender, nondistended. No palpable mass.  Musculoskeletal: able to move all extremities. Able to propel self in wheelchair. Bil heels tender to palpation Skin: Warm and dry. No rash noted. No  erythema.  Neurological: Alert and oriented to person, place, and time.  Psychiatric: Judgment and insight adequate. Appropriate mood and affect.   Labs Reviewed  CBC Latest Ref Rng 04/22/2014 11/15/2013 01/25/2013  WBC - 5.5 5.7 7.1  Hemoglobin 12.0 - 16.0 g/dL 10.8(A) 11.0(A) 10.5(L)  Hematocrit 36 - 46 % 35(A) 35(A) 32.0(L)  Platelets 150 - 399 K/L 261 397 393.0     CMP Latest Ref Rng 05/22/2014 04/22/2014 11/15/2013  Glucose 70 - 99 mg/dL - - -  BUN 4 - 21 mg/dL 55(A) 51(A) 37(A)  Creatinine 0.5 - 1.1 mg/dL 2.4(A) 2.5(A) 2.0(A)  Sodium 137 - 147 mmol/L 137 139 135(A)  Potassium 3.4 - 5.3 mmol/L 3.9 4.0 4.4  Chloride 96 - 112 mEq/L - - -  CO2 19 - 32 mEq/L - - -  Calcium 8.4 - 10.5 mg/dL - - -  Total Protein 6.0 - 8.3 g/dL - - -  Albumin 3.5 - 4.7 g/dL - - -  Total Bilirubin 0.3 - 1.2 mg/dL - - -  Alkaline Phos 25 - 125 U/L - - -  AST 13 - 35 U/L - - -  ALT 7 - 35 U/L - - -    Lab Results  Component Value Date   HGBA1C 6.0 10/24/2013   HGBA1C 6.0 10/24/2013    Lab Results  Component Value Date   TSH 4.22 04/22/2014    Lipid Panel     Component Value Date/Time   CHOL 118 08/20/2013   TRIG 61 08/20/2013   HDL 54 08/20/2013   CHOLHDL 2 12/24/2008 0913   VLDL 14.2 12/24/2008 0913   LDLCALC 52 08/20/2013   04/22/14. Dig level 1.0  Assessment & Plan 1. Insomnia Stable. Continue ambien cr 6.88m daily at bedtime and monitor  2. Depression Stable. Continue celexa 278mdaily and monitor for change in mood  3. Chronic obstructive pulmonary disease, unspecified COPD, unspecified chronic bronchitis type Stable. No recent exacerbations. Continue spiriva 2 puffs daily and albuterol hfa 2 puffs every six hours as needed for shortness of breath and wheezing. Continue to monitor.   4. HYPERTENSION, BENIGN ESSENTIAL Stable. Continue amlodipine 1033maily, carvidilol 6.23m88mice daily, torsemide 20mg27mly, and catapres 0.1mg/280m patch once a week. Continue to monitor her status.    5. Constipation, unspecified constipation type Stable. Continue colace 100mg t68m daily, sennakot 8.6mg dai72m and miralax 17g twice daily. Continue to monitor  6. Osteoarthritis  Stable. Reported bil heel pain today-most likely secondary to OA or bone spur. Encourage resident to ask for pain med. Continue norco 5/323mg eve35mour hours as needed for pain.  Reassess and continue to monitor  7. Anemia  Most likely in the setting of CKD. Last h&h 10.8/35. Continue ferrous sulfate 323mg dail24monitor h&h  8. Chronic diastolic congestive heart failure Clinically compensated. Continue torsemide 20mg daily43mreg 6.23mg twice 19my, and digoxin 123mcg every 30mr day. Continue to monitor  9. Chronic atrial fibrillation Stable. Recent dig level 1.0 on 04/22/14. Continue digoxin 123mcg every o80m day and amiodarone 200mg twice dai29mContinue asa 81mg daily and 47mtor.    10. CAD Remains chest pain free. Continue asa 81mg daily and p67mx 75mg daily. Conti52mSL nitro as needed for chest pain. LDL at goal and BP is adequately controlled on current regimen. Continue to monitor her status  11. GERD Stable. Continue zantac 150mg daily and mon64m  12. Hypothyroidism Stable. TSH normal. Continue levothyroxine 23mcg daily.   13. 40m stage 4 Recent eGFR 24.  Avoid nephrotoxic drugs. Pending nephrology referral. Continue to renal function and her status.    Family/Staff Communication Plan of care discussed with resident and nursing staff. Resident and nursing staff verbalized understanding and agree with plan of care. No additional questions or concerns reported.    Arthur Holms, MSN, AGNP-C [facility] [address] [place], [place] [address] [phone] [8am-5pm] After hours: [phone]

## 2014-07-17 ENCOUNTER — Encounter: Payer: Self-pay | Admitting: Registered Nurse

## 2014-07-17 ENCOUNTER — Non-Acute Institutional Stay (SKILLED_NURSING_FACILITY): Payer: Medicare Other | Admitting: Registered Nurse

## 2014-07-17 ENCOUNTER — Other Ambulatory Visit: Payer: Self-pay | Admitting: Nephrology

## 2014-07-17 DIAGNOSIS — I482 Chronic atrial fibrillation, unspecified: Secondary | ICD-10-CM

## 2014-07-17 DIAGNOSIS — N183 Chronic kidney disease, stage 3 unspecified: Secondary | ICD-10-CM

## 2014-07-17 DIAGNOSIS — I5032 Chronic diastolic (congestive) heart failure: Secondary | ICD-10-CM

## 2014-07-17 DIAGNOSIS — E039 Hypothyroidism, unspecified: Secondary | ICD-10-CM

## 2014-07-17 DIAGNOSIS — I10 Essential (primary) hypertension: Secondary | ICD-10-CM | POA: Diagnosis not present

## 2014-07-17 DIAGNOSIS — J449 Chronic obstructive pulmonary disease, unspecified: Secondary | ICD-10-CM | POA: Diagnosis not present

## 2014-07-17 DIAGNOSIS — N184 Chronic kidney disease, stage 4 (severe): Secondary | ICD-10-CM | POA: Diagnosis not present

## 2014-07-17 DIAGNOSIS — K5900 Constipation, unspecified: Secondary | ICD-10-CM

## 2014-07-17 DIAGNOSIS — E119 Type 2 diabetes mellitus without complications: Secondary | ICD-10-CM

## 2014-07-17 DIAGNOSIS — I2510 Atherosclerotic heart disease of native coronary artery without angina pectoris: Secondary | ICD-10-CM | POA: Diagnosis not present

## 2014-07-17 DIAGNOSIS — K219 Gastro-esophageal reflux disease without esophagitis: Secondary | ICD-10-CM

## 2014-07-17 DIAGNOSIS — F329 Major depressive disorder, single episode, unspecified: Secondary | ICD-10-CM

## 2014-07-17 DIAGNOSIS — N189 Chronic kidney disease, unspecified: Secondary | ICD-10-CM | POA: Diagnosis not present

## 2014-07-17 DIAGNOSIS — G4700 Insomnia, unspecified: Secondary | ICD-10-CM

## 2014-07-17 DIAGNOSIS — F32A Depression, unspecified: Secondary | ICD-10-CM

## 2014-07-17 DIAGNOSIS — D631 Anemia in chronic kidney disease: Secondary | ICD-10-CM

## 2014-07-17 DIAGNOSIS — M159 Polyosteoarthritis, unspecified: Secondary | ICD-10-CM

## 2014-07-17 NOTE — Progress Notes (Signed)
Patient ID: [name], female   DOB: [birth year], 81 y.o.   MRN: [id number]   Place of Service: [facility]  Allergies  Allergen Reactions  . Naproxen     REACTION: gastritis    Code Status: Full Code  Goals of Care: Longevity/LTC  Chief Complaint  Patient presents with  . Medical Management of Chronic Issues    insomnia, HTN, CHF, COPD, CKD, GERD, constipation, Hypothyroidism, afib, depression    HPI 81 y.o. female with PMH of CAD, HTN, COPD, OA, HLD, depression, insomnia among others is being seen for a routine visit for management of her chronic issues. Weight overall stable. No recent fall or skin concerns reported. No change in behaviors or functional status reported. No concerns from staff. Hypothyroidism is stable on  current dose of levothyroxine. HTN is well-controlled on norvasc, coreg, catapres, and torsemide with BPs mostly in 120s-130s/60s. Mood is stable on celexa. No recent CHF exacerbation. COPD stable on spiriva and albuterol prn. OA is stable on norco prn. Seen in wheelchair today. Reported still having trouble with sleeping. No other complaints reported.   Review of Systems Constitutional: Negative for fever and chills HENT: Negative for ear pain, congestion, and sore throat Eyes: Negative for eye pain, eye discharge, and visual disturbance  Cardiovascular: Negative for chest pain, palpitations.  Respiratory: Negative cough, shortness of breath, and wheezing.  Gastrointestinal: Negative for nausea and vomiting. Negative for abdominal pain, diarrhea and constipation.  Genitourinary: Negative for  dysuria and hematuria Endocrine: Negative for polydipsia, polyphagia, and polyuria Musculoskeletal: Negative for back pain. Neurological: Negative for dizziness and headache  Skin: Negative for rash and wound.   Psychiatric: Negative for depression. Positive for insomnia  Past Medical History  Diagnosis Date  . Coronary artery disease     a. Acute MI  2003, stent to LCx. b. NSTEMI 2008 (cath w/o PCI).  c. +trop 02/2011 felt 2/2 demand ischemia.  [name] History of atrial flutter 2009    a. s/p ablation of typical flutter 09/2007.  [name] Paroxysmal atrial fibrillation     a. Dx 2009. b. Discontinuation of Coumadin 01/2008 2/2 hemarthrosis/chronic debilitation.  [name] COPD (chronic obstructive pulmonary disease)   . Hyperlipidemia   . Diabetes mellitus   . Diastolic congestive heart failure   . Arthritis   . Chronic kidney disease   . Carotid artery disease     a. Bilateral  . SSS (sick sinus syndrome)     a. Symptomatic bradycardia/syncope and post-termination pauses - St. Jude dual chamber PPM 09/2007.   [name] Pneumonia   . Anxiety     adjustment disorder  . Pacemaker   . Mesenteric ischemia     a. Possible dx, 2013.  . Diverticula of colon     a. Colonoscopy 01/2012.  [name] PVD (peripheral vascular disease)     Carotid dz as noted, repoted aortoiliac occlusive dz, also reported high-grade stenosis at the origin & prox innominate artery, high-grade stenosis of the right carotid bifurcation, moderate stenosis of the proximal prevertebral left subclavian artery by CT 02/2011  . Pituitary tumor     s/p gamma knife  . Tachy-brady syndrome   . ?? Subclavian artery stenosis, right 04/04/2012  . SMA stenosis-moderate 04/04/2012  . Diverticulosis of colon (without mention of hemorrhage) 01/27/2012  . 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 CAROTID BRUIT, RIGHT 04/26/2007    Qualifier: Diagnosis of  By: Maxie Better FNP, Rosalita Levan   . Cardiac pacemaker in situ 09/15/2009    Qualifier: Diagnosis of  By: Lovena Le, MD, Martyn Malay   . GERD (gastroesophageal reflux disease) 08/09/2013  . Pressure ulcer, heel 06/28/2013    Past Surgical History  Procedure Laterality Date  . Cardiac catheterization      2003 cardiac stent  . Insert / replace / remove pacemaker  2009    [facility]  . Cholecystectomy    . Total knee arthroplasty      bilateral  . Bilateral hip arthroscopy    . 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 Myocardial perfusion study      EF 59% 4/04  . Adenosine myoview study      abn EF 53% 12/04/04  . Carotid doppler  2/99  . Echo with lvh  1/98    old records  . Stress cardiolite  3/04    EF 53%  . Gamma knife radiosurg  08/25/06    [facility]  . Pvd      with stent in the right iliac   . Colonoscopy  01/27/2012    Procedure: COLONOSCOPY;  Surgeon: Inda Castle, MD;  Location: [facility];  Service: Endoscopy;  Laterality: N/A;  . Abdominal aortagram N/A 01/25/2012    Procedure: ABDOMINAL AORTAGRAM;  Surgeon: Serafina Mitchell, MD;  Location: [facility] CATH LAB;  Service: Cardiovascular;  Laterality: N/A;    History   Social History  . Marital Status: Widowed    Spouse Name: N/A  . Number of Children: N/A  . Years of Education: N/A   Occupational History  . Not on file.   Social History Main Topics  . Smoking status: Current Every Day Smoker -- 0.25 packs/day for 40 years    Types: Cigarettes    Last Attempt to Quit: 04/02/2012  . Smokeless tobacco: Current User    Types: Snuff  . Alcohol Use: No  . Drug Use: No  . Sexual Activity: Not on file   Other Topics Concern  . Not on file   Social History Narrative   Widowed, husband died at age 80-DM, HTN. 3 children.    Nursing assistant in past, active in church.    Son lives with her and does most of the housework and cooking. 1/10   Pt signed party release form and gives Danise Edge, granddaughter [phone]), access to medical records. Can leave msg on machine [phone]) or cell.       Medication List       This list is accurate as of: 07/17/14  8:26 PM.  Always use your most recent med list.               albuterol 108 (90 BASE) MCG/ACT inhaler  Commonly known as:  PROVENTIL HFA;VENTOLIN HFA  Inhale 2 puffs into the lungs every 6  (six) hours as needed for wheezing or shortness of breath.     amiodarone 200 MG tablet  Commonly known as:  PACERONE  Take 1 tablet (200 mg total) by mouth 2 (two) times daily.     amLODipine 10 MG tablet  Commonly known as:  NORVASC  Take 10 mg by mouth daily.     aspirin 81 MG tablet  Take 81 mg by mouth daily.     carvedilol 25 MG tablet  Commonly known as:  COREG  Take 6.25 mg by mouth 2 (two) times daily with a  meal.     citalopram 10 MG tablet  Commonly known as:  CELEXA  Take 2 tablets (20 mg total) by mouth daily.     cloNIDine 0.1 mg/24hr patch  Commonly known as:  CATAPRES - Dosed in mg/24 hr  Place 0.1 mg onto the skin once a week.     clopidogrel 75 MG tablet  Commonly known as:  PLAVIX  Take 75 mg by mouth daily with breakfast.     digoxin 0.125 MG tablet  Commonly known as:  LANOXIN  Take 1 tablet (0.125 mg total) by mouth every other day.     docusate sodium 100 MG capsule  Commonly known as:  COLACE  Take 100 mg by mouth 2 (two) times daily.     ferrous sulfate 325 (65 FE) MG tablet  Take 325 mg by mouth daily with breakfast.     HYDROcodone-acetaminophen 5-325 MG per tablet  Commonly known as:  NORCO/VICODIN  Take one tablet by mouth every 4 hours as needed for pain     levothyroxine 50 MCG tablet  Commonly known as:  SYNTHROID, LEVOTHROID  Take 25 mcg by mouth daily before breakfast.     nitroGLYCERIN 0.4 MG SL tablet  Commonly known as:  NITROSTAT  Place 0.4 mg under the tongue every 5 (five) minutes as needed. For chest pain     polyethylene glycol packet  Commonly known as:  MIRALAX / GLYCOLAX  Take 17 g by mouth 2 (two) times daily.     ranitidine 150 MG capsule  Commonly known as:  ZANTAC  Take 150 mg by mouth every morning.     senna 8.6 MG Tabs tablet  Commonly known as:  SENOKOT  Take 1 tablet by mouth at bedtime.     Tiotropium Bromide Monohydrate 2.5 MCG/ACT Aers  Commonly known as:  SPIRIVA RESPIMAT  Inhale 2 Inhalers  into the lungs daily. Use with aerochamber     torsemide 20 MG tablet  Commonly known as:  DEMADEX  Take 20 mg by mouth daily.     traZODone 50 MG tablet  Commonly known as:  DESYREL  Take 12.5 mg by mouth at bedtime.     vitamin B-12 1000 MCG tablet  Commonly known as:  CYANOCOBALAMIN  Take 1,000 mcg by mouth daily.     zolpidem 6.25 MG CR tablet  Commonly known as:  AMBIEN CR  Take 6.25 mg by mouth at bedtime.        Physical Exam  BP 126/76 mmHg  Pulse 65  Temp(Src) 97.4 F (36.3 C)  Resp 20  Ht _0  (1.651 m)  Wt 158 lb 3.2 oz (71.759 kg)  BMI 26.33 kg/m2  SpO2 95%  Constitutional: WDWN elderly female in no acute distress. Conversant and pleasant HEENT: Normocephalic and atraumatic. PERRL. EOM intact. No icterus. No nasal discharge or sinus tenderness. Oral mucosa moist. Posterior pharynx clear of any exudate or lesions.  Neck: Supple and nontender. No lymphadenopathy, masses, or thyromegaly. No JVD or carotid bruits. Cardiac: Normal S1, S2. Irregularly irregular without appreciable murmurs, rubs, or gallops. Distal pulses intact. 1+ pitting edema of BLE Lungs: No respiratory distress. Breath sounds clear bilaterally without rales, rhonchi, or wheezes. Abdomen: Audible bowel sounds in all quadrants. Soft, nontender, nondistended. No palpable mass.  Musculoskeletal: able to move all extremities. Able to propel self in wheelchair. Skin: Warm and dry. No rash noted. No erythema.  Neurological: Alert and oriented to person, place, and time.  Psychiatric: Judgment and  insight adequate. Appropriate mood and affect.   Labs Reviewed  CBC Latest Ref Rng 04/22/2014 11/15/2013 01/25/2013  WBC - 5.5 5.7 7.1  Hemoglobin 12.0 - 16.0 g/dL 10.8(A) 11.0(A) 10.5(L)  Hematocrit 36 - 46 % 35(A) 35(A) 32.0(L)  Platelets 150 - 399 K/L 261 397 393.0    CMP Latest Ref Rng 05/22/2014 04/22/2014 11/15/2013  Glucose 70 - 99 mg/dL - - -  BUN 4 - 21 mg/dL 55(A) 51(A) 37(A)  Creatinine 0.5 -  1.1 mg/dL 2.4(A) 2.5(A) 2.0(A)  Sodium 137 - 147 mmol/L 137 139 135(A)  Potassium 3.4 - 5.3 mmol/L 3.9 4.0 4.4  Chloride 96 - 112 mEq/L - - -  CO2 19 - 32 mEq/L - - -  Calcium 8.4 - 10.5 mg/dL - - -  Total Protein 6.0 - 8.3 g/dL - - -  Albumin 3.5 - 4.7 g/dL - - -  Total Bilirubin 0.3 - 1.2 mg/dL - - -  Alkaline Phos 25 - 125 U/L - - -  AST 13 - 35 U/L - - -  ALT 7 - 35 U/L - - -    Lab Results  Component Value Date   HGBA1C 6.0 10/24/2013   HGBA1C 6.0 10/24/2013    Lab Results  Component Value Date   TSH 4.22 04/22/2014    Lipid Panel     Component Value Date/Time   CHOL 118 08/20/2013   TRIG 61 08/20/2013   HDL 54 08/20/2013   CHOLHDL 2 12/24/2008 0913   VLDL 14.2 12/24/2008 0913   LDLCALC 52 08/20/2013   04/22/14. Dig level 1.0  Assessment & Plan 1. Insomnia Persists. Continue ambien cr 6.61m daily at bedtime. Will add trazodne 12.558mdaily at bedtime. Reassess and continue to monitor.  2. Depression Mood stable. Continue celexa 2027maily and monitor for change in mood  3. Chronic obstructive pulmonary disease, unspecified COPD, unspecified chronic bronchitis type Stable. No recent exacerbation. Continue spiriva 2 puffs daily and albuterol hfa 2 puffs every six hours as needed for shortness of breath and wheezing.    4. HTN Stable. Continue amlodipine 53m35mily, carvidilol 6.25mg3mce daily, torsemide 20mg 15my, and catapres 0.1mg/242mpatch once a week. Continue to monitor her status.    5. Constipation, unspecified constipation type Stable. Continue colace 100mg tw40mdaily, sennakot 8.6mg dail87mand miralax 17g twice daily. Continue to monitor  6. Osteoarthritis  Stable.  Continue norco 5/325mg ever57mur hours as needed for pain.  7. Anemia  Most likely in the setting of CKD. Last h&h 10.8/35. Continue ferrous sulfate 325mg daily41mntinue to monitor h&h  8. Chronic diastolic congestive heart failure Appears euvolemic on exam. Continue torsemide  20mg daily,87meg 6.25mg twice d71m, and digoxin 125mcg every o66m day. Continue to monitor  9. Chronic atrial fibrillation Stable. Recent dig level 1.0 on 04/22/14. Continue digoxin 125mcg every ot77mday and amiodarone 200mg twice dail71montinue asa 81mg daily and m69mor.    10. CAD Remains chest pain free. Continue asa 81mg daily and pl81m 75mg daily. Contin79mL nitro as needed for chest pain. LDL at goal and BP is adequately controlled on current regimen. Continue to monitor her status  11. GERD Stable. Continue zantac 150mg daily and moni41m 12. Hypothyroidism Stable. TSH normal. Continue levothyroxine 25mcg daily.   13. C28mstage 4 Recent eGFR 24. Avoid nephrotoxic drugs. Continue to renal function and her status.   14. DM2 controlled Last a1c 6.0 in 6/15. Currently not on diabetic meds. Eye and  foot exam are up-to-date. Not a candidate for ACEi/ARB due to CKD. Monitor cbg weekly for now. Recheck a1c.   Family/Staff Communication Plan of care discussed with resident and nursing staff. Resident and nursing staff verbalized understanding and agree with plan of care. No additional questions or concerns reported.    Arthur Holms, MSN, AGNP-C [facility] [address] [place], [place] [address] [phone] [8am-5pm] After hours: [phone]

## 2014-07-19 ENCOUNTER — Non-Acute Institutional Stay (SKILLED_NURSING_FACILITY): Payer: Medicare Other | Admitting: Registered Nurse

## 2014-07-19 DIAGNOSIS — I10 Essential (primary) hypertension: Secondary | ICD-10-CM

## 2014-07-19 LAB — HEMOGLOBIN A1C: Hgb A1c MFr Bld: 6.2 % — AB (ref 4.0–6.0)

## 2014-07-20 ENCOUNTER — Encounter: Payer: Self-pay | Admitting: Registered Nurse

## 2014-07-20 NOTE — Progress Notes (Signed)
Patient ID: [name], female   DOB: [birth year], 81 y.o.   MRN: [id number]   Place of Service: [facility]  Allergies  Allergen Reactions  . Naproxen     REACTION: gastritis    Code Status: Full Code  Goals of Care: Longevity/LTC  Chief Complaint  Patient presents with  . Acute Visit    low bps    HPI 81 y.o. female with PMH of CAD, HTN, COPD, OA, HLD, depression, insomnia among others is being seen for an acute visit at the request of nursing staff for low BPs. Her BP was in the 60s/30s to low 80s/50s since last night. Remains asymptomatic. Most recent BPs today were 114/43 and 120/60. Seen in room today. Patient denies any concerns. Reported she slept well for the first time in a very long time.   Review of Systems Constitutional: Negative for fever and chills Eyes: Negative for eye pain, eye discharge, and visual disturbance  Cardiovascular: Negative for chest pain, palpitations.  Respiratory: Negative cough, shortness of breath, and wheezing.  Gastrointestinal: Negative for nausea and vomiting.  Neurological: Negative for dizziness and headache   Past Medical History  Diagnosis Date  . Coronary artery disease     a. Acute MI 2003, stent to LCx. b. NSTEMI 2008 (cath w/o PCI).  c. +trop 02/2011 felt 2/2 demand ischemia.  [name] History of atrial flutter 2009    a. s/p ablation of typical flutter 09/2007.  [name] Paroxysmal atrial fibrillation     a. Dx 2009. b. Discontinuation of Coumadin 01/2008 2/2 hemarthrosis/chronic debilitation.  [name] COPD (chronic obstructive pulmonary disease)   . Hyperlipidemia   . Diabetes mellitus   . Diastolic congestive heart failure   . Arthritis   . Chronic kidney disease   . Carotid artery disease     a. Bilateral  . SSS (sick sinus syndrome)     a. Symptomatic bradycardia/syncope and post-termination pauses - St. Jude dual chamber PPM 09/2007.   [name] Pneumonia   . Anxiety     adjustment disorder  . Pacemaker   . Mesenteric  ischemia     a. Possible dx, 2013.  . Diverticula of colon     a. Colonoscopy 01/2012.  [name] PVD (peripheral vascular disease)     Carotid dz as noted, repoted aortoiliac occlusive dz, also reported high-grade stenosis at the origin & prox innominate artery, high-grade stenosis of the right carotid bifurcation, moderate stenosis of the proximal prevertebral left subclavian artery by CT 02/2011  . Pituitary tumor     s/p gamma knife  . Tachy-brady syndrome   . ?? Subclavian artery stenosis, right 04/04/2012  . SMA stenosis-moderate 04/04/2012  . Diverticulosis of colon (without mention of hemorrhage) 01/27/2012  . 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 CAROTID BRUIT, RIGHT 04/26/2007    Qualifier: Diagnosis of  By: Maxie Better FNP, Rosalita Levan   . Cardiac pacemaker in situ 09/15/2009    Qualifier: Diagnosis of  By: Lovena Le, MD, Martyn Malay   . GERD (gastroesophageal reflux disease) 08/09/2013  . Pressure ulcer, heel 06/28/2013    Past Surgical History  Procedure Laterality Date  . Cardiac catheterization      2003 cardiac stent  . Insert / replace / remove pacemaker  2009    [facility]  . Cholecystectomy    . Total knee arthroplasty      bilateral  .  Bilateral hip arthroscopy    . 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 Myocardial perfusion study      EF 59% 4/04  . Adenosine myoview study      abn EF 53% 12/04/04  . Carotid doppler  2/99  . Echo with lvh  1/98    old records  . Stress cardiolite  3/04    EF 53%  . Gamma knife radiosurg  08/25/06    [facility]  . Pvd      with stent in the right iliac   . Colonoscopy  01/27/2012    Procedure: COLONOSCOPY;  Surgeon: Inda Castle, MD;  Location: [facility];  Service: Endoscopy;  Laterality: N/A;  . Abdominal aortagram N/A 01/25/2012    Procedure: ABDOMINAL AORTAGRAM;  Surgeon: Serafina Mitchell, MD;   Location: [facility] CATH LAB;  Service: Cardiovascular;  Laterality: N/A;    History   Social History  . Marital Status: Widowed    Spouse Name: N/A  . Number of Children: N/A  . Years of Education: N/A   Occupational History  . Not on file.   Social History Main Topics  . Smoking status: Current Every Day Smoker -- 0.25 packs/day for 40 years    Types: Cigarettes    Last Attempt to Quit: 04/02/2012  . Smokeless tobacco: Current User    Types: Snuff  . Alcohol Use: No  . Drug Use: No  . Sexual Activity: Not on file   Other Topics Concern  . Not on file   Social History Narrative   Widowed, husband died at age 89-DM, HTN. 3 children.    Nursing assistant in past, active in church.    Son lives with her and does most of the housework and cooking. 1/10   Pt signed party release form and gives Danise Edge, granddaughter [phone]), access to medical records. Can leave msg on machine [phone]) or cell.       Medication List       This list is accurate as of: 07/19/14 11:59 PM.  Always use your most recent med list.               albuterol 108 (90 BASE) MCG/ACT inhaler  Commonly known as:  PROVENTIL HFA;VENTOLIN HFA  Inhale 2 puffs into the lungs every 6 (six) hours as needed for wheezing or shortness of breath.     amiodarone 200 MG tablet  Commonly known as:  PACERONE  Take 1 tablet (200 mg total) by mouth 2 (two) times daily.     amLODipine 10 MG tablet  Commonly known as:  NORVASC  Take 5 mg by mouth daily.     aspirin 81 MG tablet  Take 81 mg by mouth daily.     carvedilol 3.125 MG tablet  Commonly known as:  COREG  Take 3.125 mg by mouth 2 (two) times daily with a meal.     citalopram 10 MG tablet  Commonly known as:  CELEXA  Take 2 tablets (20 mg total) by mouth daily.     cloNIDine 0.1 mg/24hr patch  Commonly known as:  CATAPRES - Dosed in mg/24 hr  Place 0.1 mg onto the skin once a week.     clopidogrel 75 MG tablet  Commonly known as:  PLAVIX    Take 75 mg by mouth daily with breakfast.     digoxin 0.125 MG tablet  Commonly known as:  LANOXIN  Take 1 tablet (  0.125 mg total) by mouth every other day.     docusate sodium 100 MG capsule  Commonly known as:  COLACE  Take 100 mg by mouth 2 (two) times daily.     ferrous sulfate 325 (65 FE) MG tablet  Take 325 mg by mouth daily with breakfast.     HYDROcodone-acetaminophen 5-325 MG per tablet  Commonly known as:  NORCO/VICODIN  Take one tablet by mouth every 4 hours as needed for pain     levothyroxine 50 MCG tablet  Commonly known as:  SYNTHROID, LEVOTHROID  Take 25 mcg by mouth daily before breakfast.     nitroGLYCERIN 0.4 MG SL tablet  Commonly known as:  NITROSTAT  Place 0.4 mg under the tongue every 5 (five) minutes as needed. For chest pain     polyethylene glycol packet  Commonly known as:  MIRALAX / GLYCOLAX  Take 17 g by mouth 2 (two) times daily.     ranitidine 150 MG capsule  Commonly known as:  ZANTAC  Take 150 mg by mouth every morning.     senna 8.6 MG Tabs tablet  Commonly known as:  SENOKOT  Take 1 tablet by mouth at bedtime.     Tiotropium Bromide Monohydrate 2.5 MCG/ACT Aers  Commonly known as:  SPIRIVA RESPIMAT  Inhale 2 Inhalers into the lungs daily. Use with aerochamber     torsemide 20 MG tablet  Commonly known as:  DEMADEX  Take 20 mg by mouth daily.     traZODone 50 MG tablet  Commonly known as:  DESYREL  Take 12.5 mg by mouth at bedtime.     vitamin B-12 1000 MCG tablet  Commonly known as:  CYANOCOBALAMIN  Take 1,000 mcg by mouth daily.     zolpidem 6.25 MG CR tablet  Commonly known as:  AMBIEN CR  Take 6.25 mg by mouth at bedtime.        Physical Exam  BP 120/64 mmHg  Pulse 80  Temp(Src) 97 F (36.1 C)  Resp 17  Constitutional: WDWN elderly female in no acute distress. Conversant and pleasant HEENT: Normocephalic and atraumatic. PERRL. EOM intact. No icterus.  Neck:  No JVD or carotid bruits. Cardiac: Normal S1, S2.  Irregularly irregular without appreciable murmurs, rubs, or gallops.  Lungs: No respiratory distress. Breath sounds clear bilaterally without rales, rhonchi, or wheezes. Abdomen: Audible bowel sounds in all quadrants. Soft, nontender, nondistended.  Musculoskeletal: able to move all extremities. Able to propel self in wheelchair. Skin: Warm and dry. Neurological: Alert and oriented to person, place, and time.  Psychiatric: Judgment and insight adequate. Appropriate mood and affect.   Labs Reviewed  CBC Latest Ref Rng 04/22/2014 11/15/2013 01/25/2013  WBC - 5.5 5.7 7.1  Hemoglobin 12.0 - 16.0 g/dL 10.8(A) 11.0(A) 10.5(L)  Hematocrit 36 - 46 % 35(A) 35(A) 32.0(L)  Platelets 150 - 399 K/L 261 397 393.0    CMP Latest Ref Rng 05/22/2014 04/22/2014 11/15/2013  Glucose 70 - 99 mg/dL - - -  BUN 4 - 21 mg/dL 55(A) 51(A) 37(A)  Creatinine 0.5 - 1.1 mg/dL 2.4(A) 2.5(A) 2.0(A)  Sodium 137 - 147 mmol/L 137 139 135(A)  Potassium 3.4 - 5.3 mmol/L 3.9 4.0 4.4  Chloride 96 - 112 mEq/L - - -  CO2 19 - 32 mEq/L - - -  Calcium 8.4 - 10.5 mg/dL - - -  Total Protein 6.0 - 8.3 g/dL - - -  Albumin 3.5 - 4.7 g/dL - - -  Total Bilirubin 0.3 -  1.2 mg/dL - - -  Alkaline Phos 25 - 125 U/L - - -  AST 13 - 35 U/L - - -  ALT 7 - 35 U/L - - -    Lab Results  Component Value Date   HGBA1C 6.0 10/24/2013   HGBA1C 6.0 10/24/2013    Lab Results  Component Value Date   TSH 4.22 04/22/2014    Lipid Panel     Component Value Date/Time   CHOL 118 08/20/2013   TRIG 61 08/20/2013   HDL 54 08/20/2013   CHOLHDL 2 12/24/2008 0913   VLDL 14.2 12/24/2008 0913   LDLCALC 52 08/20/2013   04/22/14. Dig level 1.0  Assessment & Plan 1. HYPERTENSION, BENIGN ESSENTIAL Low BPs since yesterday. Will decrease carvedilol to 3.125mg  twice daily and amlodipine to 5mg  daily. Monitor VS qshift x 5days then daily. Hold BP meds if SBP<100. Notify NP/MD if BP consistently <100/60 or >140/90. Reassess and continue to monitor.    Family/Staff Communication Plan of care discussed with resident and nursing staff. Resident and nursing staff verbalized understanding and agree with plan of care. No additional questions or concerns reported.    Arthur Holms, MSN, AGNP-C [facility] [address] [place], [place] [address] [phone] [8am-5pm] After hours: [phone]

## 2014-07-24 ENCOUNTER — Ambulatory Visit
Admission: RE | Admit: 2014-07-24 | Discharge: 2014-07-24 | Disposition: A | Discharge status: Home / Self Care | Payer: Medicare Other | Source: Ambulatory Visit | Attending: Nephrology | Admitting: Nephrology

## 2014-07-24 ENCOUNTER — Telehealth: Payer: Self-pay | Admitting: Oncology

## 2014-07-24 DIAGNOSIS — N183 Chronic kidney disease, stage 3 unspecified: Secondary | ICD-10-CM

## 2014-07-24 NOTE — Telephone Encounter (Signed)
Spoke with [facility] and confirmed appt for patient. Dx:  Positive SPEP and elevated kappa light chains on labs for evaluation of myeloma. Referring: Dr. Posey Pronto

## 2014-07-25 ENCOUNTER — Telehealth: Payer: Self-pay | Admitting: Oncology

## 2014-07-25 NOTE — Telephone Encounter (Signed)
CHART DELIVERED ON 07/25/14.  TG °

## 2014-08-02 ENCOUNTER — Non-Acute Institutional Stay (SKILLED_NURSING_FACILITY): Payer: Medicare Other | Admitting: Registered Nurse

## 2014-08-02 DIAGNOSIS — G4700 Insomnia, unspecified: Secondary | ICD-10-CM

## 2014-08-02 DIAGNOSIS — I509 Heart failure, unspecified: Secondary | ICD-10-CM | POA: Diagnosis not present

## 2014-08-02 DIAGNOSIS — I110 Hypertensive heart disease with heart failure: Secondary | ICD-10-CM

## 2014-08-02 NOTE — Progress Notes (Signed)
Patient ID: [name], female   DOB: 19-Oct-1923, 81 y.o.   MRN: [id number]   Place of Service: [facility]  Allergies  Allergen Reactions  . Naproxen     REACTION: gastritis    Code Status: Full Code  Goals of Care: Longevity/LTC  Chief Complaint  Patient presents with  . Acute Visit    insomnia, htn    HPI 81 y.o. female with PMH of CAD, HTN, COPD, OA, HLD, depression, insomnia among others is being seen for an acute visit for the evaluation of insomnia and f/u on HTN after recent change in her BP medications. Patient reported sleeping better than before, but for only one or two days since trazodone was started. Stated only sleep for a couple hours at a time, but did sleep well last night. BP has been stable range 120-130s/60-70s since recent dose reduction in bp meds. Nursing staff would like to know if it is still necessary to monitor her VS every shift. Patient and nursing staff denies any other concerns.   Review of Systems Constitutional: Negative for fever and chills Eyes: Negative for eye pain, eye discharge, and visual disturbance  Cardiovascular: Negative for chest pain, palpitations.  Respiratory: Negative cough, shortness of breath, and wheezing.  Gastrointestinal: Negative for nausea and vomiting.  Neurological: Negative for dizziness and headache   Past Medical History  Diagnosis Date  . Coronary artery disease     a. Acute MI 2003, stent to LCx. b. NSTEMI 2008 (cath w/o PCI).  c. +trop 02/2011 felt 2/2 demand ischemia.  [name] History of atrial flutter 2009    a. s/p ablation of typical flutter 09/2007.  [name] Paroxysmal atrial fibrillation     a. Dx 2009. b. Discontinuation of Coumadin 01/2008 2/2 hemarthrosis/chronic debilitation.  [name] COPD (chronic obstructive pulmonary disease)   . Hyperlipidemia   . Diabetes mellitus   . Diastolic congestive heart failure   . Arthritis   . Chronic kidney disease   . Carotid artery disease     a. Bilateral  . SSS  (sick sinus syndrome)     a. Symptomatic bradycardia/syncope and post-termination pauses - St. Jude dual chamber PPM 09/2007.   [name] Pneumonia   . Anxiety     adjustment disorder  . Pacemaker   . Mesenteric ischemia     a. Possible dx, 2013.  . Diverticula of colon     a. Colonoscopy 01/2012.  [name] PVD (peripheral vascular disease)     Carotid dz as noted, repoted aortoiliac occlusive dz, also reported high-grade stenosis at the origin & prox innominate artery, high-grade stenosis of the right carotid bifurcation, moderate stenosis of the proximal prevertebral left subclavian artery by CT 02/2011  . Pituitary tumor     s/p gamma knife  . Tachy-brady syndrome   . ?? Subclavian artery stenosis, right 04/04/2012  . SMA stenosis-moderate 04/04/2012  . Diverticulosis of colon (without mention of hemorrhage) 01/27/2012  . 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 CAROTID BRUIT, RIGHT 04/26/2007    Qualifier: Diagnosis of  By: Maxie Better FNP, Rosalita Levan   . Cardiac pacemaker in situ 09/15/2009    Qualifier: Diagnosis of  By: Lovena Le, MD, Martyn Malay   . GERD (gastroesophageal reflux disease) 08/09/2013  . Pressure ulcer, heel 06/28/2013    Past Surgical History  Procedure Laterality Date  . Cardiac catheterization      2003  cardiac stent  . Insert / replace / remove pacemaker  2009    St. Jude  . Cholecystectomy    . Total knee arthroplasty      bilateral  . Bilateral hip arthroscopy    . 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 Myocardial perfusion study      EF 59% 4/04  . Adenosine myoview study      abn EF 53% 12/04/04  . Carotid doppler  2/99  . Echo with lvh  1/98    old records  . Stress cardiolite  3/04    EF 53%  . Gamma knife radiosurg  08/25/06    [facility]  . Pvd      with stent in the right iliac   . Colonoscopy  01/27/2012    Procedure:  COLONOSCOPY;  Surgeon: Inda Castle, MD;  Location: [facility];  Service: Endoscopy;  Laterality: N/A;  . Abdominal aortagram N/A 01/25/2012    Procedure: ABDOMINAL AORTAGRAM;  Surgeon: Serafina Mitchell, MD;  Location: [facility] CATH LAB;  Service: Cardiovascular;  Laterality: N/A;    History   Social History  . Marital Status: Widowed    Spouse Name: N/A  . Number of Children: N/A  . Years of Education: N/A   Occupational History  . Not on file.   Social History Main Topics  . Smoking status: Current Every Day Smoker -- 0.25 packs/day for 40 years    Types: Cigarettes    Last Attempt to Quit: 04/02/2012  . Smokeless tobacco: Current User    Types: Snuff  . Alcohol Use: No  . Drug Use: No  . Sexual Activity: Not on file   Other Topics Concern  . Not on file   Social History Narrative   Widowed, husband died at age 25-DM, HTN. 3 children.    Nursing assistant in past, active in church.    Son lives with her and does most of the housework and cooking. 1/10   Pt signed party release form and gives Danise Edge, granddaughter [phone]), access to medical records. Can leave msg on machine [phone]) or cell.       Medication List       This list is accurate as of: 08/02/14 11:51 AM.  Always use your most recent med list.               albuterol 108 (90 BASE) MCG/ACT inhaler  Commonly known as:  PROVENTIL HFA;VENTOLIN HFA  Inhale 2 puffs into the lungs every 6 (six) hours as needed for wheezing or shortness of breath.     amiodarone 200 MG tablet  Commonly known as:  PACERONE  Take 1 tablet (200 mg total) by mouth 2 (two) times daily.     amLODipine 10 MG tablet  Commonly known as:  NORVASC  Take 5 mg by mouth daily.     aspirin 81 MG tablet  Take 81 mg by mouth daily.     carvedilol 3.125 MG tablet  Commonly known as:  COREG  Take 3.125 mg by mouth 2 (two) times daily with a meal.     citalopram 10 MG tablet  Commonly known as:  CELEXA  Take 2 tablets (20 mg  total) by mouth daily.     cloNIDine 0.1 mg/24hr patch  Commonly known as:  CATAPRES - Dosed in mg/24 hr  Place 0.1 mg onto the skin once a week.     clopidogrel 75 MG  tablet  Commonly known as:  PLAVIX  Take 75 mg by mouth daily with breakfast.     digoxin 0.125 MG tablet  Commonly known as:  LANOXIN  Take 1 tablet (0.125 mg total) by mouth every other day.     docusate sodium 100 MG capsule  Commonly known as:  COLACE  Take 100 mg by mouth 2 (two) times daily.     ferrous sulfate 325 (65 FE) MG tablet  Take 325 mg by mouth daily with breakfast.     HYDROcodone-acetaminophen 5-325 MG per tablet  Commonly known as:  NORCO/VICODIN  Take one tablet by mouth every 4 hours as needed for pain     levothyroxine 50 MCG tablet  Commonly known as:  SYNTHROID, LEVOTHROID  Take 25 mcg by mouth daily before breakfast.     nitroGLYCERIN 0.4 MG SL tablet  Commonly known as:  NITROSTAT  Place 0.4 mg under the tongue every 5 (five) minutes as needed. For chest pain     polyethylene glycol packet  Commonly known as:  MIRALAX / GLYCOLAX  Take 17 g by mouth 2 (two) times daily.     ranitidine 150 MG capsule  Commonly known as:  ZANTAC  Take 150 mg by mouth every morning.     senna 8.6 MG Tabs tablet  Commonly known as:  SENOKOT  Take 1 tablet by mouth at bedtime.     Tiotropium Bromide Monohydrate 2.5 MCG/ACT Aers  Commonly known as:  SPIRIVA RESPIMAT  Inhale 2 Inhalers into the lungs daily. Use with aerochamber     torsemide 20 MG tablet  Commonly known as:  DEMADEX  Take 20 mg by mouth daily.     traZODone 50 MG tablet  Commonly known as:  DESYREL  Take 25 mg by mouth at bedtime.     vitamin B-12 1000 MCG tablet  Commonly known as:  CYANOCOBALAMIN  Take 1,000 mcg by mouth daily.     zolpidem 6.25 MG CR tablet  Commonly known as:  AMBIEN CR  Take 6.25 mg by mouth at bedtime.        Physical Exam  BP 121/62 mmHg  Pulse 60  Temp(Src) 98.9 F (37.2 C)  Resp 20   SpO2 95%  Constitutional: WDWN elderly female in no acute distress. Conversant and pleasant HEENT: Normocephalic and atraumatic. PERRL. EOM intact. No icterus.  Neck:  No JVD or carotid bruits. Cardiac: Normal S1, S2. Irregularly irregular without appreciable murmurs, rubs, or gallops.  Lungs: No respiratory distress. Breath sounds clear bilaterally without rales, rhonchi, or wheezes. Abdomen: Audible bowel sounds in all quadrants. Soft, nontender, nondistended.  Musculoskeletal: able to move all extremities. Able to propel self in wheelchair. Skin: Warm and dry. Neurological: Alert and oriented to person, place, and time.  Psychiatric: Judgment and insight adequate. Appropriate mood and affect.   Labs Reviewed  CBC Latest Ref Rng 04/22/2014 11/15/2013 01/25/2013  WBC - 5.5 5.7 7.1  Hemoglobin 12.0 - 16.0 g/dL 10.8(A) 11.0(A) 10.5(L)  Hematocrit 36 - 46 % 35(A) 35(A) 32.0(L)  Platelets 150 - 399 K/L 261 397 393.0    CMP Latest Ref Rng 05/22/2014 04/22/2014 11/15/2013  Glucose 70 - 99 mg/dL - - -  BUN 4 - 21 mg/dL 55(A) 51(A) 37(A)  Creatinine 0.5 - 1.1 mg/dL 2.4(A) 2.5(A) 2.0(A)  Sodium 137 - 147 mmol/L 137 139 135(A)  Potassium 3.4 - 5.3 mmol/L 3.9 4.0 4.4  Chloride 96 - 112 mEq/L - - -  CO2 19 -  32 mEq/L - - -  Calcium 8.4 - 10.5 mg/dL - - -  Total Protein 6.0 - 8.3 g/dL - - -  Albumin 3.5 - 4.7 g/dL - - -  Total Bilirubin 0.3 - 1.2 mg/dL - - -  Alkaline Phos 25 - 125 U/L - - -  AST 13 - 35 U/L - - -  ALT 7 - 35 U/L - - -    Lab Results  Component Value Date   HGBA1C 6.0 10/24/2013   HGBA1C 6.0 10/24/2013    Lab Results  Component Value Date   TSH 4.22 04/22/2014    Lipid Panel     Component Value Date/Time   CHOL 118 08/20/2013   TRIG 61 08/20/2013   HDL 54 08/20/2013   CHOLHDL 2 12/24/2008 0913   VLDL 14.2 12/24/2008 0913   LDLCALC 52 08/20/2013   04/22/14. Dig level 1.0  Assessment & Plan 1. Insomnia Will increase trazodone to 25mg  daily at bedtime.  Continue ambien cr 6.25mg  daily at bedtime. Continue to monitor   2. Hypertensive heart disease with heart failure BP stable. Continue carvedilol to 3.125mg  twice daily and amlodipine to 5mg  daily. Continue to hold BP meds if SBP<100. Monitor VS daily x 1 week. If stable, monitor VS routinely. Continue to monitor   Family/Staff Communication Plan of care discussed with resident and nursing staff. Resident and nursing staff verbalized understanding and agree with plan of care. No additional questions or concerns reported.    Arthur Holms, MSN, AGNP-C [facility] [address] [place], [place] [address] [phone] [8am-5pm] After hours: [phone]

## 2014-08-16 ENCOUNTER — Non-Acute Institutional Stay (SKILLED_NURSING_FACILITY): Payer: Medicare Other | Admitting: Registered Nurse

## 2014-08-16 ENCOUNTER — Encounter: Payer: Self-pay | Admitting: Registered Nurse

## 2014-08-16 DIAGNOSIS — F329 Major depressive disorder, single episode, unspecified: Secondary | ICD-10-CM | POA: Diagnosis not present

## 2014-08-16 DIAGNOSIS — E119 Type 2 diabetes mellitus without complications: Secondary | ICD-10-CM

## 2014-08-16 DIAGNOSIS — D631 Anemia in chronic kidney disease: Secondary | ICD-10-CM

## 2014-08-16 DIAGNOSIS — I482 Chronic atrial fibrillation, unspecified: Secondary | ICD-10-CM

## 2014-08-16 DIAGNOSIS — N184 Chronic kidney disease, stage 4 (severe): Secondary | ICD-10-CM

## 2014-08-16 DIAGNOSIS — Z7189 Other specified counseling: Secondary | ICD-10-CM | POA: Diagnosis not present

## 2014-08-16 DIAGNOSIS — J449 Chronic obstructive pulmonary disease, unspecified: Secondary | ICD-10-CM | POA: Diagnosis not present

## 2014-08-16 DIAGNOSIS — G4700 Insomnia, unspecified: Secondary | ICD-10-CM | POA: Diagnosis not present

## 2014-08-16 DIAGNOSIS — M159 Polyosteoarthritis, unspecified: Secondary | ICD-10-CM

## 2014-08-16 DIAGNOSIS — F32A Depression, unspecified: Secondary | ICD-10-CM

## 2014-08-16 DIAGNOSIS — I10 Essential (primary) hypertension: Secondary | ICD-10-CM

## 2014-08-16 DIAGNOSIS — E039 Hypothyroidism, unspecified: Secondary | ICD-10-CM

## 2014-08-16 DIAGNOSIS — K5900 Constipation, unspecified: Secondary | ICD-10-CM

## 2014-08-16 DIAGNOSIS — K219 Gastro-esophageal reflux disease without esophagitis: Secondary | ICD-10-CM

## 2014-08-16 DIAGNOSIS — N189 Chronic kidney disease, unspecified: Secondary | ICD-10-CM

## 2014-08-16 DIAGNOSIS — I5032 Chronic diastolic (congestive) heart failure: Secondary | ICD-10-CM

## 2014-08-16 DIAGNOSIS — I2510 Atherosclerotic heart disease of native coronary artery without angina pectoris: Secondary | ICD-10-CM

## 2014-08-16 NOTE — Progress Notes (Signed)
Patient ID: [name], female   DOB: Sep 15, 1923, 81 y.o.   MRN: [id number]   Place of Service: [facility]  Allergies  Allergen Reactions  . Naproxen     REACTION: gastritis    Code Status: DNR  Goals of Care: Comfort and Quality of life/LTC  Chief Complaint  Patient presents with  . Medical Management of Chronic Issues    CKD4, CAD, afib, COPD, GERD, insomnia, hypothyroidism, constipation, HTN, CHF    HPI 81 y.o. female with PMH of CAD, HTN, COPD, OA, HLD, depression, insomnia among others is being seen for a routine visit for management of her chronic issues. Weight stable over the past 30 days. No recent fall  (last fall 06/26/14) or skin concerns reported. No change in behaviors or functional status reported. No concerns from staff. Hypothyroidism is stable on  current dose of levothyroxine. HTN is well-controlled on norvasc, coreg, catapres, and torsemide with BP range 100s-140s/60s, mostly in 120s/60s. Mood is stable on celexa. No recent CHF or COPDexacerbation. OA is stable on norco prn. Insomnia is stable on current regimen-reported has been sleeping well. Seen in wheelchair today. Denies any concerns.   Review of Systems Constitutional: Negative for fever and chills HENT: Negative for ear pain, congestion, and sore throat Eyes: Negative for eye pain, eye discharge, and visual disturbance  Cardiovascular: Negative for chest pain, palpitations.  Respiratory: Negative cough, shortness of breath, and wheezing.  Gastrointestinal: Negative for nausea and vomiting. Negative for abdominal pain, diarrhea and constipation.  Genitourinary: Negative for  dysuria and hematuria Endocrine: Negative for polydipsia, polyphagia, and polyuria Musculoskeletal: Negative for back pain. Neurological: Negative for dizziness and headache  Skin: Negative for rash and wound.   Psychiatric: Negative for depression.   Past Medical History  Diagnosis Date  . Coronary artery disease      a. Acute MI 2003, stent to LCx. b. NSTEMI 2008 (cath w/o PCI).  c. +trop 02/2011 felt 2/2 demand ischemia.  [name] History of atrial flutter 2009    a. s/p ablation of typical flutter 09/2007.  [name] Paroxysmal atrial fibrillation     a. Dx 2009. b. Discontinuation of Coumadin 01/2008 2/2 hemarthrosis/chronic debilitation.  [name] COPD (chronic obstructive pulmonary disease)   . Hyperlipidemia   . Diabetes mellitus   . Diastolic congestive heart failure   . Arthritis   . Chronic kidney disease   . Carotid artery disease     a. Bilateral  . SSS (sick sinus syndrome)     a. Symptomatic bradycardia/syncope and post-termination pauses - St. Jude dual chamber PPM 09/2007.   [name] Pneumonia   . Anxiety     adjustment disorder  . Pacemaker   . Mesenteric ischemia     a. Possible dx, 2013.  . Diverticula of colon     a. Colonoscopy 01/2012.  [name] PVD (peripheral vascular disease)     Carotid dz as noted, repoted aortoiliac occlusive dz, also reported high-grade stenosis at the origin & prox innominate artery, high-grade stenosis of the right carotid bifurcation, moderate stenosis of the proximal prevertebral left subclavian artery by CT 02/2011  . Pituitary tumor     s/p gamma knife  . Tachy-brady syndrome   . ?? Subclavian artery stenosis, right 04/04/2012  . SMA stenosis-moderate 04/04/2012  . Diverticulosis of colon (without mention of hemorrhage) 01/27/2012  . 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 CAROTID BRUIT,  RIGHT 04/26/2007    Qualifier: Diagnosis of  By: Maxie Better FNP, Rosalita Levan   . Cardiac pacemaker in situ 09/15/2009    Qualifier: Diagnosis of  By: Lovena Le, MD, Martyn Malay   . GERD (gastroesophageal reflux disease) 08/09/2013  . Pressure ulcer, heel 06/28/2013    Past Surgical History  Procedure Laterality Date  . Cardiac catheterization      2003 cardiac stent  . Insert / replace / remove  pacemaker  2009    St. Jude  . Cholecystectomy    . Total knee arthroplasty      bilateral  . Bilateral hip arthroscopy    . 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 Myocardial perfusion study      EF 59% 4/04  . Adenosine myoview study      abn EF 53% 12/04/04  . Carotid doppler  2/99  . Echo with lvh  1/98    old records  . Stress cardiolite  3/04    EF 53%  . Gamma knife radiosurg  08/25/06    [facility]  . Pvd      with stent in the right iliac   . Colonoscopy  01/27/2012    Procedure: COLONOSCOPY;  Surgeon: Inda Castle, MD;  Location: [place];  Service: Endoscopy;  Laterality: N/A;  . Abdominal aortagram N/A 01/25/2012    Procedure: ABDOMINAL AORTAGRAM;  Surgeon: Serafina Mitchell, MD;  Location: [facility] C[facility] LAB;  Service: Cardiovascular;  Laterality: N/A;    History   Social History  . Marital Status: Widowed    Spouse Name: N/A  . Number of Children: N/A  . Years of Education: N/A   Occupational History  . Not on file.   Social History Main Topics  . Smoking status: Current Every Day Smoker -- 0.25 packs/day for 40 years    Types: Cigarettes    Last Attempt to Quit: 04/02/2012  . Smokeless tobacco: Current User    Types: Snuff  . Alcohol Use: No  . Drug Use: No  . Sexual Activity: Not on file   Other Topics Concern  . Not on file   Social History Narrative   Widowed, husband died at age 27-DM, HTN. 3 children.    Nursing assistant in past, active in church.    Son lives with her and does most of the housework and cooking. 1/10   Pt signed party release form and gives Danise Edge, granddaughter [phone]), access to medical records. Can leave msg on machine [phone]) or cell.       Medication List       This list is accurate as of: 08/16/14  2:38 PM.  Always use your most recent med list.               albuterol 108 (90 BASE) MCG/ACT inhaler  Commonly known as:  PROVENTIL HFA;VENTOLIN HFA  Inhale 2 puffs  into the lungs every 6 (six) hours as needed for wheezing or shortness of breath.     amiodarone 200 MG tablet  Commonly known as:  PACERONE  Take 1 tablet (200 mg total) by mouth 2 (two) times daily.     amLODipine 10 MG tablet  Commonly known as:  NORVASC  Take 5 mg by mouth daily.     aspirin 81 MG tablet  Take 81 mg by mouth daily.     carvedilol 3.125 MG tablet  Commonly known as:  COREG  Take 3.125  mg by mouth 2 (two) times daily with a meal.     citalopram 10 MG tablet  Commonly known as:  CELEXA  Take 2 tablets (20 mg total) by mouth daily.     cloNIDine 0.1 mg/24hr patch  Commonly known as:  CATAPRES - Dosed in mg/24 hr  Place 0.1 mg onto the skin once a week.     clopidogrel 75 MG tablet  Commonly known as:  PLAVIX  Take 75 mg by mouth daily with breakfast.     digoxin 0.125 MG tablet  Commonly known as:  LANOXIN  Take 1 tablet (0.125 mg total) by mouth every other day.     docusate sodium 100 MG capsule  Commonly known as:  COLACE  Take 100 mg by mouth 2 (two) times daily.     ferrous sulfate 325 (65 FE) MG tablet  Take 325 mg by mouth daily with breakfast.     HYDROcodone-acetaminophen 5-325 MG per tablet  Commonly known as:  NORCO/VICODIN  Take one tablet by mouth every 4 hours as needed for pain     levothyroxine 50 MCG tablet  Commonly known as:  SYNTHROID, LEVOTHROID  Take 25 mcg by mouth daily before breakfast.     nitroGLYCERIN 0.4 MG SL tablet  Commonly known as:  NITROSTAT  Place 0.4 mg under the tongue every 5 (five) minutes as needed. For chest pain     polyethylene glycol packet  Commonly known as:  MIRALAX / GLYCOLAX  Take 17 g by mouth 2 (two) times daily.     ranitidine 150 MG capsule  Commonly known as:  ZANTAC  Take 150 mg by mouth every morning.     senna 8.6 MG Tabs tablet  Commonly known as:  SENOKOT  Take 1 tablet by mouth at bedtime.     Tiotropium Bromide Monohydrate 2.5 MCG/ACT Aers  Commonly known as:  SPIRIVA  RESPIMAT  Inhale 2 Inhalers into the lungs daily. Use with aerochamber     torsemide 20 MG tablet  Commonly known as:  DEMADEX  Take 20 mg by mouth daily.     traZODone 50 MG tablet  Commonly known as:  DESYREL  Take 25 mg by mouth at bedtime.     vitamin B-12 1000 MCG tablet  Commonly known as:  CYANOCOBALAMIN  Take 1,000 mcg by mouth daily.     zolpidem 6.25 MG CR tablet  Commonly known as:  AMBIEN CR  Take 6.25 mg by mouth at bedtime.        Physical Exam  BP 100/60 mmHg  Pulse 60  Temp(Src) 97.9 F (36.6 C)  Resp 16  Ht 5\' 5"  (1.651 m)  Wt 158 lb 8 oz (71.895 kg)  BMI 26.38 kg/m2  SpO2 99%  Constitutional: WDWN elderly female in no acute distress. Conversant and pleasant HEENT: Normocephalic and atraumatic. PERRL. EOM intact. No icterus. No nasal discharge or sinus tenderness. Oral mucosa moist. Posterior pharynx clear of any exudate or lesions.  Neck: Supple and nontender. No lymphadenopathy, masses, or thyromegaly. No JVD or carotid bruits. Cardiac: Normal S1, S2. Irregularly irregular without appreciable murmurs, rubs, or gallops. Distal pulses intact. 1+ pitting edema of BLE Lungs: No respiratory distress. Breath sounds clear bilaterally without rales, rhonchi, or wheezes. Abdomen: Audible bowel sounds in all quadrants. Soft, nontender, nondistended. No palpable mass.  Musculoskeletal: able to move all extremities. Wheelchair bound. Skin: Warm and dry. No rash noted. No erythema.  Neurological: Alert and oriented to person, place, and  time.  Psychiatric: Judgment and insight adequate. Appropriate mood and affect.   Labs Reviewed  CBC Latest Ref Rng 04/22/2014 11/15/2013 01/25/2013  WBC - 5.5 5.7 7.1  Hemoglobin 12.0 - 16.0 g/dL 10.8(A) 11.0(A) 10.5(L)  Hematocrit 36 - 46 % 35(A) 35(A) 32.0(L)  Platelets 150 - 399 K/L 261 397 393.0    CMP Latest Ref Rng 05/22/2014 04/22/2014 11/15/2013  Glucose 70 - 99 mg/dL - - -  BUN 4 - 21 mg/dL 55(A) 51(A) 37(A)  Creatinine  0.5 - 1.1 mg/dL 2.4(A) 2.5(A) 2.0(A)  Sodium 137 - 147 mmol/L 137 139 135(A)  Potassium 3.4 - 5.3 mmol/L 3.9 4.0 4.4  Chloride 96 - 112 mEq/L - - -  CO2 19 - 32 mEq/L - - -  Calcium 8.4 - 10.5 mg/dL - - -  Total Protein 6.0 - 8.3 g/dL - - -  Albumin 3.5 - 4.7 g/dL - - -  Total Bilirubin 0.3 - 1.2 mg/dL - - -  Alkaline Phos 25 - 125 U/L - - -  AST 13 - 35 U/L - - -  ALT 7 - 35 U/L - - -    Lab Results  Component Value Date   HGBA1C 6.2* 07/19/2014    Lab Results  Component Value Date   TSH 4.22 04/22/2014    Lipid Panel     Component Value Date/Time   CHOL 118 08/20/2013   TRIG 61 08/20/2013   HDL 54 08/20/2013   CHOLHDL 2 12/24/2008 0913   VLDL 14.2 12/24/2008 0913   LDLCALC 52 08/20/2013   04/22/14. Dig level 1.0  Assessment & Plan 1. Insomnia Improving. Continue ambien cr 6.25mg  daily with trazodone 25mg  daily at bedtime. Continue to monitor her status.  2. Depression Mood stable. Continue celexa 20mg  daily and monitor for change in mood  3. Chronic obstructive pulmonary disease, unspecified COPD, unspecified chronic bronchitis type No recent exacerbation. Continue spiriva 2 puffs daily and albuterol hfa 2 puffs every six hours as needed for shortness of breath and wheezing.    4. HTN Controlled. Continue amlodipine 5mg  daily, carvidilol 3.125mg  twice daily, torsemide 20mg  daily, and catapres 0.1mg /24hr patch once a week. Continue to monitor her status.    5. Constipation, unspecified constipation type Stable. Continue colace 100mg  twice daily, sennakot 8.6mg  daily, and miralax 17g twice daily. Continue to monitor  6. Osteoarthritis  Stable. Continue norco 5/325mg  every four hours as needed for pain.  7. Anemia in CKD Last h&h 10.8/35. Continue ferrous sulfate 325mg  daily. Continue to monitor h&h  8. Chronic diastolic congestive heart failure Clinically compensated. EF 55-60% on Echo in 03/2012. Continue torsemide 20mg  daily, coreg 3.125mg  twice daily, and  digoxin 137mcg every other day. Continue to monitor  9. Chronic atrial fibrillation Stable. Rate-controlled. Recent dig level 1.0 on 04/22/14. Continue digoxin 163mcg every other day and amiodarone 200mg  twice daily. Continue asa 81mg  daily and monitor.    10. CAD No anginal symptoms. Continue asa 81mg  daily and plavix 75mg  daily. Continue SL nitro as needed for chest pain. LDL at goal and BP is well-controlled on current regimen. Continue to monitor her status  11. GERD Stable. Continue zantac 150mg  daily  12. Hypothyroidism Stable. TSH normal. Continue levothyroxine 81mcg daily.   13. CKD, stage 4 Is being followed by nephrology. She stated she is not interested in having dialysis. Continue to f/u with nephrology for any renal concerns.   14. DM2 controlled Recent A1c 6.2. Currently not on diabetic meds. Cbg range 110-318 with  most reading in 110s-130s. Eye and foot exam are up-to-date. Not a candidate for ACEi/ARB due to CKD. Continue to monitor cbg weekly and monitor   15. Goals of care discussion and counseling.  We discussed about her goals of care today. Patient stated that she does not want to be resuscitated if her heart stops. Her granddaughter, Illene Regulus agrees. Will change her code status to DNR.    Family/Staff Communication Plan of care discussed with resident, granddaughter, and nursing staff. Resident, granddaughter, and nursing staff verbalized understanding and agree with plan of care. No additional questions or concerns reported.    Arthur Holms, MSN, AGNP-C [facility] [address] [place], [place] [address] [phone] [8am-5pm] After hours: [phone]

## 2014-08-27 ENCOUNTER — Ambulatory Visit: Payer: No Typology Code available for payment source

## 2014-08-27 ENCOUNTER — Ambulatory Visit: Payer: No Typology Code available for payment source | Admitting: Oncology

## 2014-08-27 ENCOUNTER — Other Ambulatory Visit: Payer: No Typology Code available for payment source

## 2014-09-02 ENCOUNTER — Ambulatory Visit
Admit: 2014-09-02 | Disposition: A | Discharge status: Home / Self Care | Payer: Self-pay | Attending: Internal Medicine | Admitting: Internal Medicine

## 2014-09-02 LAB — CBC CANCER CENTER
BASOS PCT: 0.5 %
Basophil #: 0 x10 3/mm (ref 0.0–0.1)
Eosinophil #: 0.1 x10 3/mm (ref 0.0–0.7)
Eosinophil %: 2.1 %
HCT: 39.2 % (ref 35.0–47.0)
HGB: 13 g/dL (ref 12.0–16.0)
LYMPHS PCT: 16.5 %
Lymphocyte #: 1 x10 3/mm (ref 1.0–3.6)
MCH: 32.9 pg (ref 26.0–34.0)
MCHC: 33.3 g/dL (ref 32.0–36.0)
MCV: 99 fL (ref 80–100)
Monocyte #: 0.6 x10 3/mm (ref 0.2–0.9)
Monocyte %: 9.6 %
NEUTROS PCT: 71.3 %
Neutrophil #: 4.2 x10 3/mm (ref 1.4–6.5)
Platelet: 339 x10 3/mm (ref 150–440)
RBC: 3.96 10*6/uL (ref 3.80–5.20)
RDW: 14 % (ref 11.5–14.5)
WBC: 5.9 x10 3/mm (ref 3.6–11.0)

## 2014-09-02 LAB — CREATININE, SERUM
CREATININE: 2.65 mg/dL — AB
EGFR (African American): 18 — ABNORMAL LOW
EGFR (Non-African Amer.): 15 — ABNORMAL LOW

## 2014-09-02 LAB — CALCIUM: Calcium, Total: 8.6 mg/dL — ABNORMAL LOW

## 2014-09-02 LAB — URIC ACID: URIC ACID: 7.4 mg/dL — AB

## 2014-09-04 LAB — PROT IMMUNOELECTROPHORES(ARMC)

## 2014-09-04 LAB — KAPPA/LAMBDA FREE LIGHT CHAINS (ARMC)

## 2014-09-06 NOTE — Consult Note (Signed)
General Aspect cardiologist: Dr. Rockey Situ.   [name] is a pleasant 81 year old woman with past medical history of coronary artery disease, stent placed to her left circumflex in 2003 which was patent on catheterization in 2008, history of peripheral vascular disease with bilateral carotid disease, 79% of the right internal carotid, 40-59% left internal carotid artery, also history of COPD, history of respiratory failure in August 2009 secondary to rapid atrial fibrillation and diastolic relaxation abnormality, history of sick sinus syndrome with ablation in May of 2009 with discontinuation of Coumadin in September of 09 secondary to bleeding.  She has had a recent hospital admission from October 10 to March 03, 2011. This was for pneumonia. Gentle diuresis was started she had acute renal failure. She had mildly elevated troponin which was secondary to demand ischemia. Normal LV function on echocardiogram with mild MR and TR.  She reports having hospital admission x2 for abdominal pain, diagnosis of mesenteric disease, possible mild mesenteric ischemia. She reports that the second episode to the hospital was from a episode of constipation with bleeding. She was diagnosed with diverticuli.    Recent hospital admission 2 weeks ago for dizziness. Etiology was unclear. Creatinine on arrival was 1.6. She was seen by cardiology. No intervention performed. Poor gait, weak legs.  Blood pressure was low and clonidine dose was decreased. Currently now at [facility] rehabilitation. She is on Lasix 10 mg daily. She reports worsening ankle edema.   she presented this time with generalized weakness and a headache. She also reported reported to me increased dyspnea and one episode of chest pain. She is overall a poor historian. She was noted on telemetry this afternoon to have tachycardia but went back to the normal range. she was out of Coreg recently.   Physical Exam:   GEN no acute distress     HEENT red conjunctivae, hearing intact to voice    NECK supple  No masses    RESP normal resp effort  rhonchi    CARD Regular rate and rhythm  Murmur    Murmur Systolic    Systolic Murmur Out flow    ABD denies tenderness  soft    LYMPH negative neck    EXTR negative edema    SKIN normal to palpation, skin turgor good    NEURO cranial nerves intact, motor/sensory function intact    PSYCH alert, A+O to time, place, person, good insight   Review of Systems:   Subjective/Chief Complaint weakness and a headache    General: Fatigue  Weakness    Skin: No Complaints    ENT: No Complaints    Eyes: No Complaints    Neck: No Complaints    Respiratory: Frequent cough  Short of breath    Cardiovascular: Chest pain or discomfort  Dyspnea    Gastrointestinal: No Complaints    Genitourinary: No Complaints    Vascular: No Complaints    Musculoskeletal: No Complaints    Neurologic: Headache    Hematologic: No Complaints    Endocrine: No Complaints    Psychiatric: No Complaints    Review of Systems: All other systems were reviewed and found to be negative    Medications/Allergies Reviewed Medications/Allergies reviewed   Lab Results: Routine Chem:  24-Jan-14 04:03    Glucose, Serum  176   BUN  24   Creatinine (comp)  1.52   Sodium, Serum 139   Potassium, Serum 4.0   Chloride, Serum 102   CO2, Serum 31   Calcium (  Total), Serum 8.9   Anion Gap  6   Osmolality (calc) 286   eGFR (African American)  36   eGFR (Non-African American)  31 (eGFR values <81m/min/1.73 m2 may be an indication of chronic kidney disease (CKD). Calculated eGFR is useful in patients with stable renal function. The eGFR calculation will not be reliable in acutely ill patients when serum creatinine is changing rapidly. It is not useful in  patients on dialysis. The eGFR calculation may not be applicable to patients at the low and high extremes of body sizes, pregnant women, and  vegetarians.)    09:00    Result Comment TROPONIN - RESULTS VERIFIED BY REPEAT TESTING.  - C/ALLISON [id number]  - READ-BACK PROCESS PERFORMED.  Result(s) reported on 09 Jun 2012 at 09:40AM.    23:53    Result Comment troponin - RESULTS VERIFIED BY REPEAT TESTING.  - prev called 1/23/14at 1705by [email or web address]  Result(s) reported on 09 Jun 2012 at 12:58AM.  Cardiac:  24-Jan-14 09:00    CK, Total 26   CPK-MB, Serum 0.6 (Result(s) reported on 09 Jun 2012 at 09:38AM.)   Troponin I  0.48 (0.00-0.05 0.05 ng/mL or less: NEGATIVE  Repeat testing in 3-6 hrs  if clinically indicated. >0.05 ng/mL: POTENTIAL  MYOCARDIAL INJURY. Repeat  testing in 3-6 hrs if  clinically indicated. NOTE: An increase or decrease  of 30% or more on serial  testing suggests a  clinically important change)    23:53    CK, Total 32   CPK-MB, Serum  < 0.5 (Result(s) reported on 09 Jun 2012 at 12:53AM.)   Troponin I  0.60 (0.00-0.05 0.05 ng/mL or less: NEGATIVE  Repeat testing in 3-6 hrs  if clinically indicated. >0.05 ng/mL: POTENTIAL  MYOCARDIAL INJURY. Repeat  testing in 3-6 hrs if  clinically indicated. NOTE: An increase or decrease  of 30% or more on serial  testing suggests a  clinically important change)   Radiology Results: XRay:    23-Jan-14 16:07, Chest PA and Lateral   Chest PA and Lateral    REASON FOR EXAM:    weakness, cough  COMMENTS:       PROCEDURE: DXR - DXR CHEST PA (OR AP) AND LATERAL  - Jun 08 2012  4:07PM     RESULT:     Comparison is made to a prior study dated 02/25/2011.    There is no evidence of focal infiltrates, effusions or edema. The   patient has taken a shallow inspiration. A left sided pectoralis pacing   unit is appreciated with lead tips projecting in the region of the right   atrium and right ventricle. The visualized bony skeleton is unremarkable.   The patient does demonstrate a pectus excavatum deformity.   IMPRESSION:  No evidence of acute  cardiopulmonary disease.      Thank you for this opportunity to contribute to the care of your patient.         Verified By: HMikki Santee M.D., MD  CT:    23-Jan-14 16:12, CT Head Without Contrast   CT Head Without Contrast    REASON FOR EXAM:    headache with hx "brain tumor" resected 10+ yrs ago  COMMENTS:       PROCEDURE: CT  - CT HEAD WITHOUT CONTRAST  - Jun 08 2012  4:12PM     RESULT: History: Headache.    Comparison Study: No prior.    Findings: Standard nonenhanced CT obtained. White matter changes   consistent with  chronic ischemia. No acute bony abnormality.    IMPRESSION:  White matter changes noted consistent with chronic ischemia.   No mass lesion. No hemorrhage.    Verified By: Osa Craver, M.D., MD    No Known Allergies:   Vital Signs/Nurse's Notes: **Vital Signs.:   24-Jan-14 16:00   Vital Signs Type Routine   Temperature Temperature (F) 97.2   Celsius 36.2   Temperature Source oral   Pulse Pulse 99   Respirations Respirations 18   Systolic BP Systolic BP 848   Diastolic BP (mmHg) Diastolic BP (mmHg) 72   Mean BP 92   Pulse Ox % Pulse Ox % 99   Pulse Ox Activity Level  At rest   Oxygen Delivery Room Air/ 21 %     Impression 81 year old female with a hx of MI/CAD, presenting  with the chief complaint of headache but also reports generalized weakness and one episode of chest pain. She is a poor historian. Cardiac enzymes were mildly elevated but they appear to be chronically elevated.  A/P: 1) mildly elevated troponin is likely due to supply demand ischemia related to chronic diastolic heart failure in the presence of chronic kidney disease.  I don't recommend further ischemic cardiac evaluation.  2)CAD/old MI continue medical therapy  3) chronic diastolic heart failure: She appears to be euvolemic.  4)atrial fibrillation: Continue treatment with Coreg. She was taken off warfarin in the past due to bleeding complications. the patient  recently reported to our clinic generalized weakness. She might need physical therapy consult.   Electronic Signatures: Kathlyn Sacramento (MD)  (Signed 24-Jan-14 18:12)  Authored: General Aspect/Present Illness, History and Physical Exam, Review of System, Labs, Radiology, Allergies, Vital Signs/Nurse's Notes, Impression/Plan   Last Updated: 24-Jan-14 18:12 by Kathlyn Sacramento (MD)

## 2014-09-06 NOTE — H&P (Signed)
PATIENT NAME:  [name], [name] MR#:  [id number] DATE OF BIRTH:  April 11, 1924  DATE OF ADMISSION:  06/08/2012  PRIMARY CARE PHYSICIAN:  Unknown.   PRIMARY CARDIOLOGIST:  Dr. Rockey Situ.   CHIEF COMPLAINT:  Headache and weakness.   HISTORY OF PRESENT ILLNESS:  This is an 81 year old female with a history of hypertension, diabetes and paroxysmal atrial fibrillation who presents with the above complaints. The patient says since yesterday she has had a headache on the left side and just felt very weak and not herself. She had some blurred vision associated with the headache. She just could not get out of bed which is very unusual for her. She woke up this morning and she said she just hurt all over and was not feeling well. The patient reports that she was seen by a home health nurse yesterday and was advised to the ER for further evaluation, but she did not want to go. Today, she apparently spoke with her cardiologist who asked her to come to the ER for further evaluation.   REVIEW OF SYSTEMS:  CONSTITUTIONAL: No fevers. She has generalized fatigue and weakness. No weight loss. No weight gain. EYES: Positive blurred vision. No glaucoma.  ENT: No ear pain. Positive hearing loss mild, no postnasal drip, allergies or discharge.  RESPIRATORY: No cough, wheezing, hemoptysis. Possible COPD.  CARDIOVASCULAR: No chest pain, palpitations, orthopnea, syncope. She does have some lower extremity edema. No arrhythmia or dyspnea on exertion.  GASTROINTESTINAL: No nausea, vomiting, diarrhea, abdominal pain, melena, or ulcers.  GENITOURINARY: No dysuria or hematuria.  ENDOCRINE: No polyuria or polydipsia.  HEMATOLOGY/LYMPHATICS: Positive easy bruising.  SKIN: No rashes or lesions.  MUSCULOSKELETAL: She feels generalized weakness.  NEUROLOGIC: No history of CVA or TIA.  PSYCHIATRIC: No anxiety or depression.   MEDICATIONS:   1.  Spiriva 18 mcg daily.  2.  Remeron 30 mg daily.  3.  Metformin 500 mg b.i.d.  4.   Hydralazine 25 mg t.i.d.  5.  Lasix 20 mg daily.  6.  Docusate 100 mg 3 times daily.  7.  Clonidine 0.2 mg t.i.d.  8.  Coreg 12.5 mg t.i.d.  9.  Atorvastatin 40 mg daily.   PAST MEDICAL HISTORY:   1.  Atrial fibrillation.  2.  Congestive heart failure.  3.  CAD.  4.  Diabetes.  5.  Hypertension.  6.  Hyperlipidemia.   PAST SURGICAL HISTORY:   1.  Pacemaker.  2.  Brain tumor resected which was benign greater than 10 years ago.  3.  TKR bilateral.  4.  THR bilateral.   SOCIAL HISTORY:  No tobacco, alcohol, or drug use. Her son lives with her.   FAMILY HISTORY:  Unknown.   PHYSICAL EXAMINATION: VITAL SIGNS: Temperature 98, pulse 101, respirations 18, blood pressure 129/94, 98% on room air.  GENERAL: The patient is alert and oriented. She is a little irritable because she is hungry.  HEENT: Head is atraumatic. Pupils are round and reactive. Sclerae anicteric. Mucous membranes are moist. Oropharynx is clear. He has nontender temporal arteries with palpable temporal pulses.  CARDIOVASCULAR: Regular rate and rhythm. No murmurs, gallops or rubs. PMI is not displaced.  LUNGS: Clear to auscultation without crackles, rales, rhonchi or wheezing. Normal to percussion.  ABDOMEN: Bowel sounds are positive, nontender, nondistended. No hepatosplenomegaly.  EXTREMITIES: No clubbing, cyanosis or edema.  NEUROLOGIC: Cranial nerves II through XII are intact. There are no focal deficits.  SKIN: No rash or lesions.   DIAGNOSTIC DATA:  Sodium 140,  potassium 3.8, chloride 103, bicarbonate 29, BUN 18, creatinine 1.53, glucose 113. LFTs: Total protein 8.0, albumin 3.6, bilirubin 0.5, alkaline phosphatase 114, AST 20, ALT 13. Troponin is 0.73. White blood cells 8.9, hemoglobin 12.9, hematocrit 38.3, platelets 399. ESR is 65. Urinalysis shows no leukocyte esterase or nitrites. CT of the head shows no acute intracranial hemorrhage or CVA. EKG is paced rhythm.   ASSESSMENT AND PLAN:  An 81 year old female who  says for the past few days she has had a headache on the left side with some blurred vision, just had generalized weakness, not feeling well, unable to get out of bed with a possible suspicion for temporal arteritis. She received 60 mg of prednisone in the Emergency Room.  1.  Headache, rule out temporal arteritis. The patient's ESR is elevated; however, not in the 100 range. Her symptoms are not classic for temporal arteritis. She does have a left-sided headache with some vision changes. I will go ahead and consult ophthalmology for their recommendations and evaluation. For now, we will continue the prednisone 60 mg daily.  2.  Elevated troponin without any chest pain. We will consult Dr. Rockey Situ, follow her troponins and monitor her closely. At this time, I have chosen not to anticoagulate the patient; however, if her troponins continue to climb then we will consider anticoagulation.  3.  Diabetes. We will continue outpatient medications, American Diabetes Association diet and sliding scale insulin.  4.  History of atrial fibrillation noted previously. The patient now has a paced rhythm.  5.  Hypertension. Blood pressure is controlled. We will continue outpatient medications.  6.  Acute renal failure. We will hold any nephrotoxic agents, provide some intravenous fluids and monitor creatinine.   CODE STATUS:  The patient is FULL CODE STATUS.   TIME SPENT:  Approximately 40 minutes.    ____________________________ Donell Beers. Benjie Karvonen, MD spm:si D: 06/08/2012 18:56:22 ET T: 06/08/2012 20:43:11 ET JOB#: [id number]  cc: Irvin Bastin P. Benjie Karvonen, MD, <Dictator> Minna Merritts, MD Donell Beers Shomari Matusik MD ELECTRONICALLY SIGNED 06/09/2012 13:47

## 2014-09-06 NOTE — Discharge Summary (Signed)
PATIENT NAME:  [name], DALL MR#:  [id number] DATE OF BIRTH:  1924-03-01  DATE OF ADMISSION:  01/26/2013 DATE OF DISCHARGE:  01/29/2013  ADMITTING DIAGNOSIS: Generalized weakness, cough and shortness of breath.   DISCHARGE DIAGNOSES:  1.  Generalized weakness, cough and shortness of breath due to pneumonia as well as acute diastolic congestive heart failure. The patient's now symptoms improved.  2.  Acute respiratory failure due to a combination of acute on chronic diastolic congestive heart failure as well as community-acquired pneumonia. The patient now respiratory status improved.  3.  Acute on chronic diastolic congestive heart failure. The patient currently compensated on p.o. Lasix.  4.  Diabetes, type 2.  5.  Chronic kidney disease. Renal function is stable.  6.  Generalized weakness and debility. The patient needs further rehabilitation.  7.  History of chronic atrial fibrillation. The patient is on Coreg and amiodarone.    CONSULTANTS: Physical therapy.   PERTINENT LABS AND EVALUATIONS: Admitting glucose 139, BUN 25, creatinine 1.63, sodium 138, potassium 3.9, chloride was 104, CO2 was 30, calcium was 8.3. WBC 7.3, hemoglobin 10, platelet count was 350. EKG showed electronic atrial pacemaker. Chest x-ray showed interstitial infiltrate, pulmonary edema versus infection or inflammatory and a small effusion.   HOSPITAL COURSE: Please refer to H and P done by the admitting physician. The patient is an 81 year old, African American female, who currently resides in assisted living, who presents with shortness of breath, cough and leg swelling. The patient was treated with p.o. doxycycline for possible bronchitis; however, she came to the ED with worsening shortness of breath and generalized weakness. The patient was seen in the ED and was thought to have possible pneumonia as well as acute diastolic CHF. The patient was treated with IV Lasix and IV antibiotics with significant improvement  in her symptoms. The patient started to improve from her respiratory status standpoint, but she is very weak and tired and was seen by physical therapy, who recommended that she go to rehab. At this point, the patient is agreeable to go to rehab, so arrangements for rehab are made. She is currently stable for transfer.   DISCHARGE ACTIVITY: As tolerated with PT and OT instructions for CHF given.   DIET: Low sodium, low fat, low cholesterol, ADA, 1800 calories, regular consistency.   ACTIVITY: As tolerated.   TIME-FRAME FOR FOLLOWUP: With primary MD in 1 to 2 weeks.   DISCHARGE MEDICATIONS: Atorvastatin 40 at bedtime, Nitrostat 0.4 sublingual p.r.n., hydralazine 50 mg 1 tablet t.i.d., clonidine 0.2 mg 1 tab p.o. b.i.d., aspirin 81 mg 1 tab p.o. daily, digoxin 125 mg p.o. daily, ranitidine 150 mg 1 tab p.o. daily, carvedilol 25 mg 1 tab p.o. b.i.d., albuterol 1 to 2 inhalations q.6 hours p.r.n., Spiriva 18 mcg daily, trazodone 100 mgt at bedtime, Robitussin 10 mL q.6 hours p.r.n., Tylenol 650 q.4 hours p.r.n., Imodium 2 mg as needed for diarrhea, sliding scale insulin, milk of magnesia 30 mL q.24 hours, Dulcolax 10 mg daily as needed, acetaminophen/hydrocodone 325/5 mg 1 tab every 4 hours as needed, Megace 10 mL daily, this can be continued once the patient is able to eat, Levaquin 750 mg 1 tab p.o. q.24 hours x 5 days and amiodarone 200 mg b.i.d.   TIME SPENT ON THE DISCHARGE: 35 minutes.  ____________________________ Lafonda Mosses Posey Pronto, MD shp:aw D: 01/29/2013 11:05:34 ET T: 01/29/2013 11:13:23 ET JOB#: [id number]  cc: Laveda Demedeiros H. Posey Pronto, MD, <Dictator> Alric Seton MD ELECTRONICALLY SIGNED 02/02/2013 13:00

## 2014-09-06 NOTE — H&P (Signed)
PATIENT NAME:  [name], [name] MR#:  [id number] DATE OF BIRTH:  03/16/1924  DATE OF ADMISSION:  11/29/2012  PRIMARY CARE PHYSICIAN:  Nonlocal.   REFERRING PHYSICIAN:  Dr. Reita Cliche.   CHIEF COMPLAINT:  Altered mental status, frequent falls.   HISTORY OF PRESENT ILLNESS:  The patient is an 81 year old African American female with a past medical history of hypertension, diabetes mellitus, paroxysmal atrial fibrillation on aspirin and other medical problems, is brought into the ER by her granddaughter for altered mental status.  According to the granddaughter at bedside the patient has been mixed up for the past one week and not taking her medications as scheduled.  Also, she is not eating enough and just drinking Ensure.  In the past three days she fell 2 times and hurt the left hip and left side of the abdomen.  The family members are worried about her frequent falls and confusion and brought her into the ER.  CAT scan of the head is negative for any acute intracranial bleed or acute strokes.  Pelvic x-ray and left hip x-ray did not show any fractures.  The patient is diagnosed with acute cystitis and she was given IV Rocephin and hospitalist team is called to admit the patient.  During my examination, the patient is answering most of the questions accurately.  She was telling that she was feeling tired, sleepy and at the same time she is reporting that for the past two days she is feeling weak and legs are giving away and she is sustaining falls.  Denies any trauma to the head.  No other complaints.  Her initial blood pressure was 93/58, but subsequently during my examination it was at 208/84.  Her pulse was 65.   PAST MEDICAL HISTORY:  Atrial fibrillation, chronic.  She is on aspirin.  Congestive heart failure, coronary artery disease, diabetes, hypertension, hyperlipidemia.   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   ALLERGIES:  She has no known drug allergies.   PSYCHOSOCIAL HISTORY:  Lives with her son.  Still smokes half pack to 1 pack a day.  Denies alcohol or illicit drug usage.   FAMILY HISTORY:  Hypertension, diabetes mellitus and heart problems runs in her family.   HOME MEDICATIONS:  Hydralazine 50 mg 3 times a day, furosemide 20 mg once a day, Colace 100 mg 2 times a day, digoxin 125 mcg 1 tablet every other day, clonidine 0.2 mg 2 times a day, Coreg 25 mg 2 times a day, atorvastatin 40 mg once daily, aspirin 81 mg once daily, amiodarone 200 mg 2 times a day.    REVIEW OF SYSTEMS:  CONSTITUTIONAL:  Denies any fever, but complaining of fatigue and weakness.  No weight loss or weight gain.  EYES:  Denies any blurry vision, pain or redness.  EARS, NOSE, THROAT:  Denies epistaxis, discharge, snoring.  RESPIRATION:  No coughing, wheezing or hemoptysis.  CARDIOVASCULAR:  No chest pain, palpitations or syncope.   GENITOURINARY:  Denies nausea, vomiting, diarrhea.  GENITOURINARY:  No dysuria or hematuria.  Complaining of frequent urination, which is chronic in nature.  GYNECOLOGIC AND BREAST:  Denies breast mass or vaginal discharge.  ENDOCRINE:  Denies polyuria, nocturia or thyroid problems.  Has history of diabetes mellitus. HEMATOLOGIC AND LYMPHATIC:  Denies anemia, easy bruising or bleeding.  INTEGUMENTARY:  No acne, rash.  Large bruise is noticed on the left side of the  abdomen.  MUSCULOSKELETAL:  No joint pain in the neck, back, shoulder, but complaining of left hip pain.    NEUROLOGIC:  Denies vertigo, ataxia, dysarthria.  PSYCHIATRIC:  Denies any ADD, OCD, bipolar disorder.   PHYSICAL EXAMINATION: VITAL SIGNS:  Temperature 97.9, pulse 64, respirations 18, blood pressure 208/84, pulse ox 94%.  GENERAL APPEARANCE:  Not under acute distress.  Moderately built and moderately nourished.  HEENT:  Normocephalic.  Pupils are equal, react to light and accommodation.  No  scleral icterus.  No conjunctival injection.  No sinus tenderness.  No postnasal drip.  NECK:  Supple.  No JVD.  No thyromegaly.  Range of motion is intact.  LUNGS:  Clear to auscultation bilaterally.  No accessory muscle usage.  No anterior chest wall tenderness on palpation.  Pacemaker site is intact.  CARDIOVASCULAR:  Irregularly irregular.  Positive murmur.  GASTROINTESTINAL:  Soft.  Bowel sounds are positive in all four quadrants.  Slightly tender in the left part of the abdomen where she sustained a fall and bruised.  No hepatosplenomegaly.  No rebound tenderness.   NEUROLOGIC:  Awake, alert, oriented x 2 to 3.  Motor and sensory are grossly intact.  Cranial nerves II through XII are grossly intact.  EXTREMITIES:  1+ edema is present.  No cyanosis.  No clubbing.  SKIN:  Warm to touch.  Normal turgor.  No rashes.  No lesions.  Bruising is noticed on the left upper part of the abdomen.  PSYCHIATRIC:  Normal mood and affect.  MUSCULOSKELETAL:  Left hip is tender.  Also, left knee is tender to touch.  No joint effusion is noticed.   LABORATORIES AND IMAGING STUDIES:  Left hip x-ray, no acute osseous injury of the left hip.  Pelvic x-ray, no fractures or dislocations.  CAT scan of the head without contrast, no acute intracranial process.  Troponin is elevated at 0.17, but it looks like patient has chronic troponin spillage.  For the past 1 to 2 years her troponin is chronically elevated and greater than 0.26.  The patient's glucose is at 105, BUN 38, creatinine 2.23, sodium is 134, potassium 4.1, chloride 99, GFR 22, serum osmolality is 278, calcium 9.0.  LFTs are normal.  Troponin 0.17 today.  WBC 7.3, hemoglobin 12.1, hematocrit is 36.2, platelets 272.  Urinalysis, yellow in color, hazy in appearance, leukocyte esterase trace, nitrate negative.  A 12-lead EKG revealed a normal sinus rhythm at 64, normal PR and QRS interval.  The patient has a chronic atrial pacemaker and  nonspecific intraventricular  block.    ASSESSMENT AND PLAN:  An 81 year old female brought into the ER for altered mental status, frequent falls and decreased by mouth intake for the past one week, will be admitted with the following assessment and plan.  1.  Altered mental status, probably from dehydration and acute cystitis.  Pan cultures were obtained in the ER.  We will give her IV Rocephin and provide her IV fluids with close monitoring for symptoms and signs of fluid overload in view of congestive heart failure.  2.  Malignant hypertension, probably from noncompliance with medications.  We will resume her home medications and titrate meds as needed basis.  The patient's blood pressure being very high, we will provide her hydralazine in the IV form q. 6 hours as needed.  3.  Acute on chronic renal insufficiency.  We will give her IV fluids.  Avoid nephrotoxins.  Hold metformin.  4.  Elevated troponin.  It is probably from chronic  troponin leakage.  The patient denies any chest pain or shortness of breath, but however given the history of chronic atrial fibrillation and coronary artery disease, congestive heart failure we will recycle cardiac biomarkers.  5.  Diabetes mellitus.  I am holding metformin in view of renal insufficiency.  The patient will be on sliding scale insulin.  6.  We will provide her GI and DVT prophylaxis.  7.  She is FULL CODE.  Her Granddaughter is medical POA.   Total time spent on the admission, discussion with the patient, physical examination, reviewing old medical records and discussed with ER staff is 50 minutes.     ____________________________ Nicholes Mango, MD ag:ea D: 11/29/2012 00:26:40 ET T: 11/29/2012 01:36:43 ET JOB#: [id number]  cc: Nicholes Mango, MD, <Dictator> Minna Merritts, MD Outside Physician Nicholes Mango MD ELECTRONICALLY SIGNED 12/10/2012 6:39

## 2014-09-06 NOTE — H&P (Signed)
PATIENT NAME:  [name], MAENZA MR#:  [id number] DATE OF BIRTH:  Jul 04, 1923  DATE OF ADMISSION:  01/26/2013  PRIMARY CARE PHYSICIAN:   Dr. Arnette Norris.   CHIEF COMPLAINT: Not feeling well, cough and shortness of breath.   [name] is a pleasant 81 year old African American female, comes in from [facility] assisted living with increasing shortness of breath, cough, productive, along with leg swelling. She was started on p.o. doxycycline by her primary care physician for productive cough. She was noted to have increasing shortness of breath, came to the Emergency Room, does have evidence of mild congestive heart failure, along with possible early right lower lobe pneumonia. She is being admitted for further evaluation and management. The patient is afebrile. Her sats are 92% on room air. No family member is present in the Emergency Room.   ALLERGIES: No known drug allergies.   PAST MEDICAL HISTORY:   1.  Dementia.  2.  Atrial fibrillation, chronic, on aspirin.  3.  Congestive heart failure, diastolic.  4.  CAD.  5.  Diabetes.  6.  Hypertension.  7.  Hyperlipidemia.   PAST SURGICAL HISTORY: 1.  Status post pacemaker placement.  2.   Total knee replacement, bilaterally.  3.  Total hip replacement, bilaterally.  4.  Resection of benign brain tumor 10 years ago.   SOCIAL HISTORY: The patient lives at [facility] assisted living. She has extensive history of tobacco abuse. Denies alcohol or illicit drug use.   FAMILY HISTORY: From old records, positive for hypertension, diabetes and heart problems.   HOME MEDICATIONS: 1.  Acetaminophen 325 mg 2 tablets every 4 hours as needed.  2.  Acetaminophen/hydrocodone 325/5, 1 tablet every 4 hours as needed.  3.  Albuterol 1 to 2 puffs oral inhalation every 6 hours as needed.  4.  Amiodarone 200 mg b.i.d.  5.  Aspirin 81 mg daily.  6.  Atorvastatin 40 mg at bedtime.  7.  Coreg 25 mg b.i.d.  8.  Clonidine 0.2 mg b.i.d.  9.  Digoxin 0.125 mg p.o.  daily.  10.  Docusate sodium 100 mg b.i.d.  11.  Doxycycline 100 mg b.i.d. was recently started for cough and congestion.  12.  Dulcolax 10 mg rectal suppository as needed.  13.  Fleet enema as needed.  14.  Lasix 40 mg daily.  15.  Hydralazine 50 mg 3 times a day.  16.  Imodium 2 tablets 1 after each diarrheal stool as needed.  17.  Mylanta as needed.  18.  Milk of magnesium is needed.  19.  Nitrostat 0.4 mg sublingual as needed.  20.  NovoLog FlexPen per sliding scale.  21.  Pepto-Bismol use as directed.  22.  Ranitidine 150 mg p.o. daily.  23.  Robitussin 10 mL every 6 hours as needed.  24.  Tiotropium 80 mcg inhalation daily.  25.  Trazodone 50 mg 2 tablets at bedtime.   REVIEW OF SYSTEMS:  CONSTITUTIONAL: Positive for low-grade fever, fatigue, weakness.  EYES: No blurred or double vision. No glaucoma or cataracts.  ENT: No tinnitus, ear pain, hearing loss or postnasal drip.  RESPIRATORY: Positive for cough, shortness of breath and dyspnea.  CARDIOVASCULAR: No chest pain, orthopnea. Positive for leg edema, hypertension and dyspnea on  exertion.  GASTROINTESTINAL: No nausea, vomiting, diarrhea, abdominal pain or hematemesis.  GENITOURINARY: No dysuria, hematuria or frequency.  ENDOCRINE: No polyuria, nocturia or thyroid problems.  HEMATOLOGY: No anemia or easy bruising.  SKIN: No acne or rash.  MUSCULOSKELETAL: Positive for  arthritis and back pain.  NEUROLOGIC: No CVA, TIA, seizure or ataxia.  PSYCHIATRIC: No anxiety or depression. The patient does have history of chronic dementia. All other systems reviewed and negative.   PHYSICAL EXAMINATION: GENERAL: The patient is awake, alert, oriented x 2.  VITAL SIGNS: Afebrile. Pulse is 66. Blood pressure is 133/76. Sats are 99R% on 2 liters.  HEENT: Atraumatic, normocephalic. Pupils: PERRLA.  EOM intact. Oral mucosa is moist.  NECK: Supple. No JVD. No carotid bruit.  RESPIRATORY: There are bibasilar crackles heard. No wheezing, use  of accessory muscles or respiratory distress.  CARDIOVASCULAR: Both the heart sounds are normal. Rate, rhythm regular. PMI not lateralized. Chest nontender.  EXTREMITIES: Good pedal pulses, good femoral pulses. There is 3+ pitting edema, more so prominent at the ankle bilaterally.  ABDOMEN: Soft, benign, nontender. No organomegaly. Positive bowel sounds.  NEUROLOGIC: Limited exam. The patient is able to move all extremities well. There is no focal neuro deficit. The patient's strength is 4/5 in both upper and lower extremities.  SKIN: Warm and dry.  PSYCHIATRIC:  The patient is awake, alert, oriented x 2.   Chest x-ray consistent with pulmonary vascular congestion and possible area of early infiltrate in the right lower lobe. EKG shows pacemaker changes.   White count is 7.3. H and H is 10 and 29.8. Platelet count is 350. Glucose is 139. BUN is 25. Creatinine is 1.3. Sodium is 138. Potassium is 3.89. Chloride is 104. Bicarb is 30. Calcium is 8.3.   ASSESSMENT: An 81 year old [name] with history of hypertension, diastolic congestive heart failure and chronic kidney disease, comes in with:  1.  Progressive increasing shortness of breath, cough, productive, with low-grade fever, likely due to right lower lobe infiltrate. The patient will be admitted on telemetry floor. We will continue IV Levaquin. Follow up blood cultures, sputum cultures, if patient able to give sputum sample. Continue DuoNebs around the clock as needed, and we will continue Spiriva.  2.  Acute on chronic congestive heart failure, diastolic. I will give Lasix IV 20 mg t.i.d. today and then change it. Once the patient diureses, we will change to p.o. Lasix 40 mg home dose when appropriate. Monitor I's and O's and creatinine.  3.  Type 2 diabetes. We will place the patient on sliding scale. She does not take any medications routinely.  4.  Acute on chronic kidney disease. The patient's creatinine is at baseline. Monitor creatinine   with IV Lasix.  5.  Generalized weakness and debility. We will have physical therapy see the patient.  6.  History of chronic atrial fib. Continue Coreg and amiodarone. The patient takes baby aspirin.  7.  Deep venous thrombosis prophylaxis, subQ heparin.  8.  Care management for discharge planning.   The patient is a FULL CODE. We will discuss code status with family once they arrive here in the hospital.    TIME SPENT: 40 minutes.    ____________________________ Hart Rochester Posey Pronto, MD sap:dmm D: 01/26/2013 17:22:08 ET T: 01/26/2013 19:44:29 ET JOB#: [id number]  cc: Aroldo Galli A. Posey Pronto, MD, <Dictator> Marciano Sequin. Deborra Medina, MD Ilda Basset MD ELECTRONICALLY SIGNED 01/30/2013 8:22

## 2014-09-06 NOTE — Discharge Summary (Signed)
PATIENT NAME:  [name], [name] MR#:  [id number] DATE OF BIRTH:  July 01, 1923  DATE OF ADMISSION:  11/29/2012 DATE OF DISCHARGE:  12/02/2012   DISPOSITION: Transfer to skilled nursing facility at [facility].   DISCHARGE DIAGNOSES: 1.  Acute encephalopathy.  2.  Escherichia coli urinary tract infection.  3.  Malignant hypertension.  4.  Acute on chronic diastolic congestive heart failure.  5.  Acute renal failure over chronic kidney disease.  6.  Elevated troponin secondary to demand ischemia.  7.  Diabetes mellitus.   IMAGING STUDIES DONE: Include a CT scan of the head without contrast, which showed no acute intracranial process.   Hip and pelvis x-ray showed no dislocation or acute fracture.   ADMITTING HISTORY AND PHYSICAL: Please see detailed H and P dictated by Dr. Margaretmary Eddy on 11/29/2012. In brief, this 81 year old female patient presented to the hospital with confusion and frequent falls. The patient was found to have a UTI, admitted to the hospitalist service.   The patient's urine cultures have grown E. coli, which are sensitive to Ceftin, which will be continued after discharge for 5 more days.   The patient's confusion has resolved, although she still feels extremely weak and is being transferred to rehab for further physical therapy as recommended by PT here in the hospital. The patient did have acute renal failure over chronic kidney disease, for which she got IV fluid resuscitation. This has caused mild fluid overload with acute on chronic diastolic CHF, which responded well to a single dose of IV Lasix. The patient is presently euvolemic at the time of discharge.   The patient still feels a little weak, with neurological exam showing 5/5 strength in upper and lower extremities on exam on day of discharge.   Vitals today show temperature 98.3, pulse 65, blood pressure 114/79, saturating 94% on room air.   DISCHARGE MEDICATIONS: Include: 1.  Docusate sodium 100 mg oral 2  times a day.  2.  Metformin 5 mg oral 2 times a day.  3.  Atorvastatin 40 mg oral once a day.  4.  Aspirin 81 mg oral once a day.  5.  Lasix 20 mg oral once a day.  6.  Coreg 25 mg oral 2 times a day.  7.  Amiodarone 200 mg oral 2 times a day.  8.  Nitrostat 0.4 mg sublingual every 5 minutes as needed for chest pain.  9.  Digoxin 125 mcg oral once a day.  10.  Hydralazine 50 mg oral 3 times a day.  11.  Acetaminophen/hydrocodone 325/5 oral every 4 hours as needed for pain.  12.  Clonidine 0.2 mg 2 times a day.  13.  Insulin aspart sliding scale.  14.  Ranitidine 150 mg oral every 12 hours.  15.  Ceftin 500 mg oral tablets for 5 days.   DISCHARGE INSTRUCTIONS: The patient will be on diabetic diet. Activity as tolerated with assistance. Follow up with rehab physician in 1 to 2 days.   TIME SPENT: Time spent on day of discharge in discharge activity was 38 minutes.   ____________________________ Leia Alf Koji Niehoff, MD srs:jm D: 12/02/2012 13:50:37 ET T: 12/02/2012 14:28:15 ET JOB#: [id number]  cc: Alveta Heimlich R. Kila Godina, MD, <Dictator> Neita Carp MD ELECTRONICALLY SIGNED 12/03/2012 23:42

## 2014-09-06 NOTE — Discharge Summary (Signed)
PATIENT NAME:  [name], BRAY MR#:  [id number] DATE OF BIRTH:  [birth year]  DATE OF ADMISSION:  02/10/2013 DATE OF DISCHARGE:  02/14/2013  REASON FOR ADMISSION: Abdominal pain.   DISCHARGE DIAGNOSES: 1.  Severe constipation/obstipation.  2.  Chest pain.  3.  Non-ST elevation myocardial infarction.  4.  Chronic systolic and diastolic congestive heart failure with ejection fraction 45%.  5.  Type 2 diabetes.  6.  Chronic kidney disease.  7.  Generalized weakness.  8.  Chronic atrial fibrillation.  9.  Digoxin toxicity.   10.  Chronic abdominal pain.  11.  Hypertension.  12.  Low blood pressures after non-ST elevation myocardial infarction for which blood pressure medications have been modified.   DISPOSITION: To skilled nursing facility.   MEDICATIONS AT DISCHARGE: Nitrostat 0.4 mg sublingual p.r.n., aspirin 81 mg once a day, ranitidine 150 mg once a day, albuterol CFC as needed for shortness of breath, Spiriva 18 mcg once daily, trazodone 100 mg at bedtime, Robitussin 5 mL 2 tsp every 6 hours, acetaminophen 325 mg 2 tablets every 4 hours as needed for fever, Imodium 2 mg as needed for diarrhea, NovoLog insulin sliding scale, milk of magnesia as needed for constipation, Dulcolax suppository p.r.n., Megace 10 mL once a day, amiodarone 200 mg twice daily, Norco 5/325 mg as needed for pain, Senna as needed once a day, mirtazapine 15 mg at bedtime, Mylanta 2 tsp as needed for heartburn, atorvastatin 40 mg once a day, docusate 2 times daily, digoxin 0.125 mg every other day, Plavix 75 mg once a day, Coreg 6.25 mg twice daily, furosemide 40 mg once a day as needed only for weight gain of 2 pounds in 24 hours or 5 pounds accumulative once a week, polyethylene glycol every 12 hours, Ensure Plus 3 times a day.   Polyethylene glycol every 12 hours. Ensure Plus 3 times a day.   FOLLOWUP: With primary care physician at the skilled nursing facility as needed and Dr. Arnette Norris in 1 to 2 weeks.    IMPORTANT RESULTS: Creatinine 1.94 on admission, potassium 4.2, calcium 8.2. Lipase 53. LFTs with slight elevation of AST at 43. Troponin 1.6 on admission, as high is 1.7 trended down to 1.4. Digoxin 2.8, went up to 3.2 and at discharge is 2.01. Hemoglobin was 10.6 on admission. White count is 11.6. Hemoglobin at discharge 9.8. Urinalysis negative for infection.   EKG: Atrial fibrillation on admission, normal sinus rhythm now.   HOSPITAL COURSE: This is a very nice 81 year old female with history of living at [facility] assisted living facility. She has coronary artery disease with a PCI and a stent in the past, diastolic and systolic congestive heart failure, paroxysmal atrial fibrillation, permanent pacemaker, Alzheimer dementia, chronic mesenteric ischemia. Recently admitted to [facility] due to pneumonia and CHF, discharged 01/29/2013.    Apparently, the patient had abdominal pain that was located in the left lower quadrant. Not radiating in nature with no bowel movement for over 3 days. The patient was severely constipated, came to the ER. In the ER started complaining of chest pain. She also has elevated troponins for which she was treated for non-ST elevation MI.   The patient was put on heparin drip, which was stopped and then started her on Plavix and aspirin. Her TIMI score was 5. Cardiology was consulted and they decided to continue that treatment.   The patient also had atrial fibrillation, which on admission was showing a heart rate in the 60s. The patient was on  digoxin. Digoxin level was elevated above 3 for which the patient was changed to a different digoxin dose at discharge.  After this new MI, the patient had lower blood pressures for which most of her medications were stopped except for her beta blocker, digoxin and Lasix as needed.   The patient had some CHF exacerbation during this hospitalization for which she was treated with Lasix. As her blood pressure was lower we  decided to change Lasix only as p.r.n.  PROBLEMS: 1.  Chest pain. The patient had a non-ST elevation MI.  She was put on aspirin, Coreg, nitroglycerin, Plavix. Her heparin drip was stopped and she was changed to antiplatelet therapy. 2.  She has a previous echo which showed an ejection fraction of 45%.  3.  The patient was hypotensive for which she was put on different doses of her medications.  4.  As far as her diabetes, she was controlled with insulin sliding scale.  Her chronic kidney disease remained stable during this hospitalization. She was really weak for which she was not able to go back to [place].  It was recommended for her to go to a skilled nursing facility for short time.  5.  Her digoxin toxicity was not treated with DigiFab. The patient only got her digoxin held.  There were no major EKG changes for which that was sufficient and now her level is normal. We are going to change digoxin to every other day.  6.  The patient has chronic abdominal pain and most of her problems were probably secondary to the stress of being severely constipated.  Her non-ST elevation MI was type II secondary to increased stress and demand ischemia.    7.  As far as her hypertension, again medications were held and now she is currently on Coreg.   The patient is discharged in good condition today. time spend 45 min   ____________________________ [place] Sink, MD rsg:dp D: 02/14/2013 10:34:15 ET T: 02/14/2013 11:51:12 ET JOB#: [id number]  cc: [place] Sink, MD, <Dictator> Marciano Sequin. Deborra Medina, MD Roselie Awkward America Brown MD ELECTRONICALLY SIGNED 02/17/2013 14:51

## 2014-09-06 NOTE — H&P (Signed)
PATIENT NAME:  [name], [name] MR#:  [id number] DATE OF BIRTH:  08-11-1923  DATE OF ADMISSION:  02/10/2013  REFERRING PHYSICIAN:  Dr. Kerman Passey.  PRIMARY CARE PHYSICIAN:  Dr. Arnette Norris.  CHIEF COMPLAINT:  Abdominal pain.   HISTORY OF PRESENT ILLNESS:  [name] is an 81 year old African American female who currently resides at [place] assisted [facility] with past medical history of coronary artery disease status post PCI and stenting, diastolic congestive heart failure, paroxysmal atrial fibrillation, permanent pacemaker insertion, Alzheimer's dementia, chronic mesenteric ischemia who was recently at [facility] for pneumonia as well as acute diastolic congestive heart failure and discharged on 01/29/2013.  She is currently presenting with two day duration of abdominal pain located in the left lower quadrant, describe only as pain, which is nonradiating in nature.  No worsening or relieving factors, although it has been progressively worsening.  She has had associated constipation for the last three days.  At [facility] assisted living they have tried manual disimpaction without results and then sent her to [facility] for further work-up and evaluation of her abdominal pain with hopes to relieve her constipation.  Through initial work-up she was found to have elevated troponins and lateral EKG ST depressions which appeared new from previous findings.  The patient herself denies any chest pain, palpitations, shortness of breath.  Her abdominal pain has been completely resolved after 2 mg IV morphine.  Currently asymptomatic.    REVIEW OF SYSTEMS: CONSTITUTIONAL:  Denies fevers, fatigue, chills, weakness.  EYES:  Denies blurred vision, eye pain.  EARS, NOSE, THROAT:  Denies discharge or dysphagia.  RESPIRATORY:  Denies cough, shortness of breath.  CARDIOVASCULAR:  Denies chest pain or palpitations.  Does have lower extremity edema which is chronic.  Denies  dyspnea on exertion.  GASTROINTESTINAL:  Abdominal pain and constipation as above.  Denies any nausea or vomiting.  GENITOURINARY:  Denies dysuria, hematuria.  ENDOCRINE:  Denies nocturia or thyroid problems.  HEMATOLOGIC AND LYMPHATIC:  Denies easy bruising or bleeding.  SKIN:  Denies rashes or lesions.  MUSCULOSKELETAL:  Denies pain in her neck, back, shoulders and knees.  Denies arthritis.  NEUROLOGIC:  Denies paralysis or paresthesias.  PSYCHIATRIC:  Denies any anxiety or depressive symptoms.  Otherwise, full review of systems performed by me is negative.   PAST MEDICAL HISTORY:  Coronary artery disease status post PCI with stenting, diastolic congestive heart failure, paroxysmal atrial fibrillation, history of permanent pacemaker insertion, Alzheimer's dementia, chronic mesenteric ischemia, diabetes, hypertension, hyperlipidemia.   SOCIAL HISTORY:  Currently resides at [facility], has extensive history of tobacco abuse.  Does not smoke since her previous admission, so for about 2 to 3 weeks now.  Denies any alcohol or drug usage.  FAMILY HISTORY:  For hypertension, diabetes as well as heart problems.   ALLERGIES:  NAPROXEN.   HOME MEDICATIONS:  Include acetaminophen 325 two tablets q. 4 hours as needed for pain.  Acetaminophen/hydrocodone 325/5 mg q. 4 hours as needed for pain, albuterol 90 mcg inhalation 1 to 2 puffs q. 6 hours as needed for wheezing and shortness of breath, amiodarone 200 mg by mouth twice daily, aspirin 81 mg by mouth daily, atorvastatin 40 mg by mouth daily, Coreg 25 mg by mouth twice daily, clonidine 0.2 mg by mouth twice daily, digoxin 125 mcg by mouth daily, Colace 100 mg by mouth twice daily, Dulcolax laxative rectal suppository 10 mg needed for constipation daily, Lasix 40 mg by mouth daily, hydralazine 50 mg  by mouth three times daily, Imodium 2 mg 1 tab after each loose stools as needed for diarrhea, Megace 40 mg suspension 40 mg/mL 10 mm  suspension daily, milk of magnesia 8% solution 30 mL oral suspension daily as needed for constipation, Remeron 15 mg by mouth at bedtime, Mylanta 30 mL daily as needed for heartburn, Nitrostat 0.4 mg sublingual tablet q. 5 minutes, three doses as needed for chest pain, insulin sliding scale, Robitussin 100 mg/5 mL oral liquid 10 mL q. 6 hours as needed for cough,Senna 8.6 mg by mouth daily, tiotropium 18 mcg inhalation daily, trazodone 50 mg two tabs by mouth at bedtime.   PHYSICAL EXAMINATION: VITAL SIGNS:  Temperature 97.9, heart rate 64, respirations 16, blood pressure 123/37, saturating 98% on supplemental O2, weight 68 kg, BMI 25.8.  GENERAL:  Well-nourished, well-developed, African American female in no acute distress.  HEAD:  Normocephalic, atraumatic.  EYES:  Pupils equal, round, reactive to light.  Extraocular muscles are intact.  No icterus noted. MOUTH:  Dry mucosal membranes.  Dentition intact.  No abscess.  EAR, NOSE, AND THROAT:  Clear without exudate.  No external lesions noted.  NECK:  Supple with no JVD.  No thyromegaly.  No lymphadenopathy.  PULMONARY:  Clear to auscultation bilaterally without wheezes, rubs or rhonchi.  No use of accessory muscles.  Good respiratory effort.  Chest tender to palpation.  CARDIOVASCULAR:  S1, S2, regular rate and rhythm, 3 out of 6 systolic ejection murmur best heard at right upper sternal border, has 2+ lower extremity edema to the shin, pedal pulses 2+.  GASTROINTESTINAL:  Soft, nontender, nondistended.  No masses.  Hypoactive bowel sounds.  No hepatosplenomegaly.  MUSCULOSKELETAL:  No swelling.  No clubbing, 2+ lower extremity edema described above, range of motion in all extremities full.  NEUROLOGIC:  Cranial nerves II through XII intact.  Sensation and reflexes are intact.  SKIN:  No ulcerations or lesions.  Skin dry, good skin turgor.  PSYCHIATRIC:  Mood and affect within normal limits.  The patient originally sleeping, but easily arousable and  oriented.  Insight and judgment intact.   LABORATORY DATA:  Sodium 136, potassium 4.2, chloride 101, bicarb 31, BUN 30, creatinine 1.94, glucose 122, lipase 53, calcium 8.2, protein 7.1, albumin 2.8, bili 0.3, alk phos 99, AST 43, ALT 32, troponin I 1.60, CK-MB 2.5, CK 157.  WBC 11.6, hemoglobin 10.6, MCV 95, RDW 14.9, platelets 293.  Urinalysis negative for signs of infection, low proteinuria of 100 mg/dL.  Chest x-ray revealing a permanent pacemaker, intact and in place, cardiomegaly as well as small bilateral pleural effusion.   EKG:  Normal sinus rhythm with lateral ST depression which is new when compared to all previous EKGs.   ASSESSMENT AND PLAN:  An 81 year old African American female with a history of coronary artery disease status post PCI and stenting, diastolic congestive heart failure, paroxysmal atrial fibrillation as well as permanent pacemaker and mesenteric ischemia presenting with abdominal pain, through initial work-up found to have EKG with ST depressions in the lateral leads as well as elevated troponin.  1.  Non-ST-elevation myocardial infarction with elevated troponins and no ST depressions, TIMI score of 5 placing her at 26% risk for 14 day for all cause mortality or worsening ischemia requiring cardiac catheterization.  We will start heparin drip after heparin bolus, trend cardiac enzymes.  Check lipid panel.  She will be given aspirin and statin now.  We will consult [facility] which is her cardiology group.  2.  Abdominal pain in the setting of constipation.  She is currently asymptomatic after a dosage of morphine.  We will provide good bowel regimen.  She thought she may need an enema if by mouth measures are incapable of causing bowel movement.  3.  Coronary artery disease.  Continue aspirin, Coreg and Lipitor.  4.  Diastolic congestive heart failure, in stable condition, not active at the moment.  Continue her home dosage of Coreg, Lasix and hydralazine.  5.  Type 2  diabetes, place her on insulin sliding scale.  6.  Paroxysmal atrial fibrillation.  We will check a digoxin level.  Continue digoxin as well as amiodarone.  7.  DVT prophylaxis.  The patient is on heparin drip.  8.  THE PATIENT IS FULL CODE.   TIME SPENT:  55 minutes.     ____________________________ Aaron Mose. Adaysha Dubinsky, MD dkh:ea D: 02/10/2013 01:48:35 ET T: 02/10/2013 02:37:19 ET JOB#: [id number]  cc: Aaron Mose. Bertine Schlottman, MD, <Dictator> Suda Forbess Woodfin Ganja MD ELECTRONICALLY SIGNED 02/10/2013 21:56

## 2014-09-06 NOTE — Consult Note (Signed)
General Aspect [name] is a pleasant 81 year old woman with past medical history of coronary artery disease, stent placed to her left circumflex in 2003 which was patent on catheterization in 2008, history of peripheral vascular disease with bilateral carotid disease, 79% of the right internal carotid, 40-59% left internal carotid artery, COPD, history of respiratory failure in August 2009 secondary to rapid atrial fibrillation and diastolic relaxation abnormality, history of sick sinus syndrome with ablation in May of 2009 permanent pacemaker insertion, Alzheimer???s dementia, chronic mesenteric ischemia, with discontinuation of Coumadin in September of 09 secondary to bleeding.  Recent admission for acute diastolic congestive heart failure and discharged on 01/29/2013.  She is  presenting with two day duration of abdominal pain located in the left lower quadrant, describe only as pain, which is nonradiating in nature.  No worsening or relieving factors, although it has been progressively worsening.  She has had associated constipation for the last three days.    At [facility] assisted living they have tried manual disimpaction without results and then sent her to [facility] for further work-up and evaluation of her abdominal pain with hopes to relieve her constipation.    In the ER, she was found to have elevated troponins and EKG changes The patient herself denies any chest pain, palpitations, shortness of breath.  Her abdominal pain has been completely resolved after 2 mg IV morphine.  Currently asymptomatic.   Present Illness currently resides at [place] assisted living center   Physical Exam:  GEN no acute distress   HEENT red conjunctivae, hearing intact to voice   NECK supple  No masses   RESP normal resp effort  rhonchi   CARD Regular rate and rhythm  Murmur   Murmur Systolic   Systolic Murmur Out flow   ABD denies tenderness  soft   LYMPH negative neck    EXTR negative edema   SKIN normal to palpation, skin turgor good   NEURO cranial nerves intact, motor/sensory function intact   PSYCH alert, A+O to time, place, person, good insight   Review of Systems:  Subjective/Chief Complaint weakness and a headache   General: Fatigue  Weakness   Skin: No Complaints   ENT: No Complaints   Eyes: No Complaints   Neck: No Complaints   Respiratory: Frequent cough  Short of breath   Cardiovascular: Chest pain or discomfort  Dyspnea   Gastrointestinal: No Complaints   Genitourinary: No Complaints   Vascular: No Complaints   Musculoskeletal: No Complaints   Neurologic: Headache   Hematologic: No Complaints   Endocrine: No Complaints   Psychiatric: No Complaints   Review of Systems: All other systems were reviewed and found to be negative   Medications/Allergies Reviewed Medications/Allergies reviewed   Lab Results:  Hepatic:  26-Sep-14 20:50   Bilirubin, Total 0.3  Alkaline Phosphatase 99  SGPT (ALT) 32  SGOT (AST)  43  Total Protein, Serum 7.1  Albumin, Serum  2.8  TDMs:  26-Sep-14 20:50   Digoxin, Serum  2.8 (Therapeutic range for digoxin in patients with atrial fibrillation: 0.8 - 2.0 ng/mL. In patients with congestive heart failure a therapeutic range of 0.5 - 0.8 ng/mL is suggested as higher levels are associated with an increased risk of toxicity without clear evidence of enhanced efficacy. Digoxin toxicity is commonly associated with serum levels > 2.0 ng/mL but may occur with lower levels, including those in the therapeutic range. Blood samples should be obtained 6-8 hours after administration to assure a reasonable volume  of distribution.)  Routine Chem:  26-Sep-14 20:50   Result Comment DIGOXIN - RESULTS VERIFIED BY REPEAT TESTING.  - NOTIFIED OF CRITICAL VALUE  - C/LEANNA Rice AT 3614 02/10/13.PMH  - READ-BACK PROCESS PERFORMED.  Result(s) reported on 10 Feb 2013 at 03:25AM.  Lipase  53  (Result(s) reported on 09 Feb 2013 at 09:42PM.)  Glucose, Serum  122  BUN  30  Creatinine (comp)  1.94  Sodium, Serum 136  Potassium, Serum 4.2  Chloride, Serum 101  CO2, Serum 31  Calcium (Total), Serum  8.2  Osmolality (calc) 279  eGFR (African American)  26  eGFR (Non-African American)  23 (eGFR values <28m/min/1.73 m2 may be an indication of chronic kidney disease (CKD). Calculated eGFR is useful in patients with stable renal function. The eGFR calculation will not be reliable in acutely ill patients when serum creatinine is changing rapidly. It is not useful in  patients on dialysis. The eGFR calculation may not be applicable to patients at the low and high extremes of body sizes, pregnant women, and vegetarians.)  Anion Gap  4  Cardiac:  26-Sep-14 20:50   CK, Total 157  CPK-MB, Serum 2.5 (Result(s) reported on 09 Feb 2013 at 09:42PM.)  Troponin I  1.60 (0.00-0.05 0.05 ng/mL or less: NEGATIVE  Repeat testing in 3-6 hrs  if clinically indicated. >0.05 ng/mL: POTENTIAL  MYOCARDIAL INJURY. Repeat  testing in 3-6 hrs if  clinically indicated. NOTE: An increase or decrease  of 30% or more on serial  testing suggests a  clinically important change)  27-Sep-14 02:53   Troponin I  1.70 (0.00-0.05 0.05 ng/mL or less: NEGATIVE  Repeat testing in 3-6 hrs  if clinically indicated. >0.05 ng/mL: POTENTIAL  MYOCARDIAL INJURY. Repeat  testing in 3-6 hrs if  clinically indicated. NOTE: An increase or decrease  of 30% or more on serial  testing suggests a  clinically important change)  Routine Hem:  26-Sep-14 20:50   WBC (CBC)  11.6  RBC (CBC)  3.35  Hemoglobin (CBC)  10.6  Hematocrit (CBC)  31.7  Platelet Count (CBC) 293 (Result(s) reported on 09 Feb 2013 at 09:17PM.)  MCV 95  MCH 31.6  MCHC 33.3  RDW  14.9   EKG:  Interpretation EKG shows NSR with rate 66 bpm, nonspecific ST ABN in V3 to V6   Radiology Results: XRay:    26-Sep-14 23:07, Chest Portable Single  View  Chest Portable Single View   REASON FOR EXAM:    nstemi  COMMENTS:       PROCEDURE: DXR - DXR PORTABLE CHEST SINGLE VIEW  - Feb 09 2013 11:07PM     RESULT: Comparison is made to the study of January 26, 2013.    The lungs remain well expanded. The interstitial markings are less   prominent. The cardiac silhouette remains enlarged. The pulmonary   vascularity is less engorged. A permanent pacemaker is in place.    IMPRESSION:  The findings likely reflect low-grade CHF superimposed upon   COPD. The degree of interstitial edema is felt to be less than on the   previous study. A followup PA and lateral chest x-ray would be of value   when the patient can tolerate the procedure.   Dictation Site: 1        Verified By: DAVID A. JMartinique M.D., MD    Naproxen: Unknown  Vital Signs/Nurse's Notes: **Vital Signs.:   27-Sep-14 02:30  Vital Signs Type Admission  Temperature Temperature (F) 98  Celsius 36.6  Temperature Source oral  Pulse Pulse 51  Systolic BP Systolic BP 045  Diastolic BP (mmHg) Diastolic BP (mmHg) 73  Mean BP 83  Pulse Ox % Pulse Ox % 98  Oxygen Delivery 2L; Nasal Cannula    Impression 81 year old female with a hx of MI/CAD, presenting  with the chief complaint of headache but also reports generalized weakness and one episode of chest pain. She is a poor historian. Cardiac enzymes were mildly elevated but they appear to be chronically elevated.  A/P: 1) Elevated cardiac enz/NSTEMI likely due to ischemia,  in the presence of chronic kidney disease, Known severe CAD (and PAD) Troponin 1.9 Currently with no sx of chest pain or SOB, possible event from recent severe ABD pain (ischemic bowel? vs constipation). --Discussed with family at length. As she has no sx, will monitor for now. No plan for cardiac cath at this time, but if she has sx, could do a cath, staged PCI  2)CAD/old MI as above Likely as active ischemia, on heparin, asa, plavix, b-blocker and  statin  3) ABN EKG ischemia, elevated tropoin Hold digoxin, level too high  4) chronic diastolic heart failure:  She appears to be euvolemic. flat neck vein poor po intake today Continue outpt meds, could hold lasix given poor po intake  4)atrial fibrillation:  Continue treatment with Coreg. She was taken off warfarin in the past due to bleeding complications. Maintaining NSR  5) ABD pain ischemic bowel, constipation. on heparin   Electronic Signatures: Ida Rogue (MD)  (Signed 27-Sep-14 12:28)  Authored: General Aspect/Present Illness, History and Physical Exam, Review of System, Home Medications, Labs, EKG , Radiology, Allergies, Vital Signs/Nurse's Notes, Impression/Plan   Last Updated: 27-Sep-14 12:28 by Ida Rogue (MD)

## 2014-09-06 NOTE — Discharge Summary (Signed)
PATIENT NAME:  [name], [name] MR#:  [id number] DATE OF BIRTH:  Jul 25, 1923  DATE OF ADMISSION:  06/08/2012 DATE OF DISCHARGE:  06/11/2012  DISCHARGE DIAGNOSES:  1.  Headache, blurred vision with possible temporal arteritis.  2.  Hypertension.  3.  Coronary artery disease with history of stents.  4.  History of peripheral vascular disease.  5.  History of chronic obstructive pulmonary disease.  6.  Hyperlipidemia.  7.  Diabetes mellitus type 2.  8.  Chronic kidney disease stage 3.  9.  Diastolic heart failure and also history of chronic atrial fibrillation, not on anticoagulation due to bleeding complication before.   DISCHARGE MEDICATIONS:  1.  Colace 100 mg p.o. b.i.d.  2.  Metformin 500 mg p.o. b.i.d.  3.  Mirtazapine 30 mg p.o. daily.  4.  Atorvastatin 40 mg p.o. daily.  5.  Clonidine 0.2 mg p.o. b.i.d.  6.  Furosemide 20 mg p.o. daily.  7.  Hydralazine 25 mg p.o. t.i.d.  8.  Spiriva 18 mcg inhalation daily.  9.  Coreg 25 mg p.o. b.i.d.  The patient's Coreg is increased to 25 mg b.i.d. from 12.5 mg.   10.  Prednisone 60 mg daily.  Prescription was given for 10 days.  11.  Aspirin 81 mg daily.   Patient discharged home.   DIET: Low sodium, low fat diet. She has [facility] home [facility] for physical therapy needs.   CONSULTATION: Cardiology consult with Dr. Fletcher Anon and ophthalmology consult was requested with Dr. George Ina. The patient was not seen by him but he advised that he can follow up with him as an outpatient on Monday. The patient was given prednisone prescription for possible temporal arteritis.    HOSPITAL COURSE:  1.  The patient is an 81 year old female with multiple medical problems of hypertension, diabetes, chronic obstructive pulmonary disease, hyperlipidemia, coronary artery disease, status post myocardial infarction, history of pacemaker, and also history of coronary artery disease with stent placement. Also history of chronic atrial fibrillation, not on  anticoagulation due to bleeding complications before. Came in with headache and blurred vision, mainly on the left side. The patient's CT of the head did not show any acute changes and was stable when she came, admitted for possible temporal arteritis, started on high dose steroids.  Ophthalmology  consultation obtained with Dr. George Ina, but he could not see the patient. I advised the patient to follow up with him in the outpatient. She was given a dose of prednisone. The patient's headache got better with Tylenol. Her ESR is elevated to 65 and also her CRP is also high at 8.4, so she was given prednisone for possible temporal arteritis and advised to follow with Dr. George Ina, ophthalmologist, on Monday.  2.  Hypertension and also atrial fibrillation with uncontrolled heart rate. The patient seen by Dr. Fletcher Anon. The patient had slightly elevated troponins up to 0.60 with normal CK and they recommended no further cardiac workup, especially with no symptoms. The patient's heart rate was up to 110.  She was on 10 mg Cardizem IV push and increased the Coreg to 25 mg p.o. b.i.d., which helped her with heart rate. At the time of dischargee, heart rate was around 91. blood pressure 120/83, saturations 94% on room air. The patient's blood pressure was also stable with increased Coreg dose and  hydralazine and Lasix.  3.  Chronic kidney disease stage 3: The patient's BMP: Creatinine 1.52, BUN 24, and she was stable. Initially, she did not get metformin but restarted  at the time of discharge.  4.  For her chronic obstructive pulmonary disease, she is advised to continue Spiriva.   condition at the time of discharge: Stable, seen by physical therapy.  They recommended home with 24-hour assistance.  The patient says she lives with a son, so she was sent home in stable condition.   TIME SPENT: More than 30 minutes on discharge preparation.    ____________________________ Epifanio Lesches, MD sk:th D: 06/11/2012  22:29:30 ET T: 06/11/2012 22:50:27 ET JOB#: [id number]  cc: Epifanio Lesches, MD, <Dictator> Epifanio Lesches MD ELECTRONICALLY SIGNED 06/25/2012 20:54

## 2014-09-11 LAB — CBC CANCER CENTER
BASOS ABS: 0 x10 3/mm (ref 0.0–0.1)
Basophil %: 0.5 %
Eosinophil #: 0.1 x10 3/mm (ref 0.0–0.7)
Eosinophil %: 1.6 %
HCT: 37.5 % (ref 35.0–47.0)
HGB: 12.5 g/dL (ref 12.0–16.0)
LYMPHS ABS: 1.1 x10 3/mm (ref 1.0–3.6)
Lymphocyte %: 17.5 %
MCH: 33.2 pg (ref 26.0–34.0)
MCHC: 33.4 g/dL (ref 32.0–36.0)
MCV: 99 fL (ref 80–100)
MONO ABS: 0.5 x10 3/mm (ref 0.2–0.9)
Monocyte %: 8 %
NEUTROS ABS: 4.6 x10 3/mm (ref 1.4–6.5)
Neutrophil %: 72.4 %
PLATELETS: 304 x10 3/mm (ref 150–440)
RBC: 3.77 10*6/uL — ABNORMAL LOW (ref 3.80–5.20)
RDW: 13.8 % (ref 11.5–14.5)
WBC: 6.3 x10 3/mm (ref 3.6–11.0)

## 2014-09-20 ENCOUNTER — Non-Acute Institutional Stay (SKILLED_NURSING_FACILITY): Payer: Medicare Other | Admitting: Internal Medicine

## 2014-09-20 DIAGNOSIS — N184 Chronic kidney disease, stage 4 (severe): Secondary | ICD-10-CM

## 2014-09-20 DIAGNOSIS — R634 Abnormal weight loss: Secondary | ICD-10-CM

## 2014-09-20 DIAGNOSIS — D638 Anemia in other chronic diseases classified elsewhere: Secondary | ICD-10-CM | POA: Insufficient documentation

## 2014-09-20 DIAGNOSIS — F329 Major depressive disorder, single episode, unspecified: Secondary | ICD-10-CM | POA: Diagnosis not present

## 2014-09-20 DIAGNOSIS — E538 Deficiency of other specified B group vitamins: Secondary | ICD-10-CM

## 2014-09-20 DIAGNOSIS — I509 Heart failure, unspecified: Secondary | ICD-10-CM

## 2014-09-20 DIAGNOSIS — J449 Chronic obstructive pulmonary disease, unspecified: Secondary | ICD-10-CM | POA: Diagnosis not present

## 2014-09-20 DIAGNOSIS — N189 Chronic kidney disease, unspecified: Secondary | ICD-10-CM

## 2014-09-20 DIAGNOSIS — E032 Hypothyroidism due to medicaments and other exogenous substances: Secondary | ICD-10-CM

## 2014-09-20 DIAGNOSIS — K219 Gastro-esophageal reflux disease without esophagitis: Secondary | ICD-10-CM | POA: Diagnosis not present

## 2014-09-20 DIAGNOSIS — R5383 Other fatigue: Secondary | ICD-10-CM

## 2014-09-20 DIAGNOSIS — F32A Depression, unspecified: Secondary | ICD-10-CM

## 2014-09-20 DIAGNOSIS — Z0000 Encounter for general adult medical examination without abnormal findings: Secondary | ICD-10-CM | POA: Diagnosis not present

## 2014-09-20 DIAGNOSIS — I5032 Chronic diastolic (congestive) heart failure: Secondary | ICD-10-CM

## 2014-09-20 DIAGNOSIS — G4700 Insomnia, unspecified: Secondary | ICD-10-CM

## 2014-09-20 DIAGNOSIS — M159 Polyosteoarthritis, unspecified: Secondary | ICD-10-CM

## 2014-09-20 DIAGNOSIS — I110 Hypertensive heart disease with heart failure: Secondary | ICD-10-CM | POA: Diagnosis not present

## 2014-09-20 DIAGNOSIS — E1122 Type 2 diabetes mellitus with diabetic chronic kidney disease: Secondary | ICD-10-CM | POA: Diagnosis not present

## 2014-09-20 DIAGNOSIS — I481 Persistent atrial fibrillation: Secondary | ICD-10-CM

## 2014-09-20 DIAGNOSIS — I4819 Other persistent atrial fibrillation: Secondary | ICD-10-CM

## 2014-09-20 NOTE — Progress Notes (Signed)
Patient ID: [name], female   DOB: [birth year], 81 y.o.   MRN: [id number]    Facility: [facility]   Chief Complaint  Patient presents with  . Annual Exam   Allergies  Allergen Reactions  . Naproxen     REACTION: gastritis   Code status: DNR  HPI 81 y/o female patient is seen for annual visit. She has medical history of CAD, CHF- diastolic, HTN, COPD, HLD among others. She has lost 4 lbs since April on review of weight sheet.her blood pressure reading has been elevated between 292-446 systolic. She feels tired and lacks energy for last 4-5 days. No recent fall or skin concerns reported. No change in behaviors or functional status reported. No concerns from staff.   Review of Systems  Constitutional: Negative for fever, chills, diaphoresis.  HENT: Negative for congestion, hearing loss and sore throat.   Eyes: Negative for blurred vision, double vision and discharge.  Respiratory: Negative for cough, sputum production, shortness of breath and wheezing.   Cardiovascular: Negative for chest pain, palpitations. Has chronic leg swelling. denies breast pain. Gastrointestinal: Negative for heartburn, nausea, vomiting, abdominal pain. had bowel movement yesterday. Appetite is good Genitourinary: Negative for dysuria, flank pain.  Musculoskeletal: Negative for back pain, falls.  Skin: Negative for itching and rash.  Neurological: Negative for dizziness, tingling, focal weakness and headaches.  Psychiatric/Behavioral: Negative for depression.  Past Medical History  Diagnosis Date  . Coronary artery disease     a. Acute MI 2003, stent to LCx. b. NSTEMI 2008 (cath w/o PCI).  c. +trop 02/2011 felt 2/2 demand ischemia.  [name] History of atrial flutter 2009    a. s/p ablation of typical flutter 09/2007.  [name] Paroxysmal atrial fibrillation     a. Dx 2009. b. Discontinuation of Coumadin 01/2008 2/2 hemarthrosis/chronic debilitation.  [name] COPD (chronic obstructive  pulmonary disease)   . Hyperlipidemia   . Diabetes mellitus   . Diastolic congestive heart failure   . Arthritis   . Chronic kidney disease   . Carotid artery disease     a. Bilateral  . SSS (sick sinus syndrome)     a. Symptomatic bradycardia/syncope and post-termination pauses - St. Jude dual chamber PPM 09/2007.   [name] Pneumonia   . Anxiety     adjustment disorder  . Pacemaker   . Mesenteric ischemia     a. Possible dx, 2013.  . Diverticula of colon     a. Colonoscopy 01/2012.  [name] PVD (peripheral vascular disease)     Carotid dz as noted, repoted aortoiliac occlusive dz, also reported high-grade stenosis at the origin & prox innominate artery, high-grade stenosis of the right carotid bifurcation, moderate stenosis of the proximal prevertebral left subclavian artery by CT 02/2011  . Pituitary tumor     s/p gamma knife  . Tachy-brady syndrome   . ?? Subclavian artery stenosis, right 04/04/2012  . SMA stenosis-moderate 04/04/2012  . Diverticulosis of colon (without mention of hemorrhage) 01/27/2012  . 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 CAROTID BRUIT, RIGHT 04/26/2007    Qualifier: Diagnosis of  By: Maxie Better FNP, Rosalita Levan   . Cardiac pacemaker in situ 09/15/2009    Qualifier: Diagnosis of  By: Lovena Le, MD, Martyn Malay   . GERD (gastroesophageal reflux disease) 08/09/2013  . Pressure ulcer, heel 06/28/2013   Past Surgical History  Procedure Laterality Date  .  Cardiac catheterization      2003 cardiac stent  . Insert / replace / remove pacemaker  2009    St. Jude  . Cholecystectomy    . Total knee arthroplasty      bilateral  . Bilateral hip arthroscopy    . 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 Myocardial perfusion study      EF 59% 4/04  . Adenosine myoview study      abn EF 53% 12/04/04  . Carotid doppler  2/99  . Echo with lvh  1/98      old records  . Stress cardiolite  3/04    EF 53%  . Gamma knife radiosurg  08/25/06    [facility]  . Pvd      with stent in the right iliac   . Colonoscopy  01/27/2012    Procedure: COLONOSCOPY;  Surgeon: Inda Castle, MD;  Location: [place];  Service: Endoscopy;  Laterality: N/A;  . Abdominal aortagram N/A 01/25/2012    Procedure: ABDOMINAL AORTAGRAM;  Surgeon: Serafina Mitchell, MD;  Location: [facility] C[facility];  Service: Cardiovascular;  Laterality: N/A;   Family History  Problem Relation Age of Onset  . Cancer Neg Hx   . Depression      ?  . Diabetes      DM and HBP- family hx   History   Social History  . Marital Status: Married    Spouse Name: N/A  . Number of Children: N/A  . Years of Education: N/A   Occupational History  . Not on file.   Social History Main Topics  . Smoking status: Current Every Day Smoker -- 0.25 packs/day for 40 years    Types: Cigarettes    Last Attempt to Quit: 04/02/2012  . Smokeless tobacco: Current User    Types: Snuff  . Alcohol Use: No  . Drug Use: No  . Sexual Activity: Not on file   Other Topics Concern  . Not on file   Social History Narrative   Widowed, husband died at age 8-DM, HTN. 3 children.    Nursing assistant in past, active in church.    Son lives with her and does most of the housework and cooking. 1/10   Pt signed party release form and gives Danise Edge, granddaughter [phone]), access to medical records. Can leave msg on machine [phone]) or cell.      Medication List       This list is accurate as of: 09/20/14  3:09 PM.  Always use your most recent med list.               albuterol 108 (90 BASE) MCG/ACT inhaler  Commonly known as:  PROVENTIL HFA;VENTOLIN HFA  Inhale 2 puffs into the lungs every 6 (six) hours as needed for wheezing or shortness of breath.     amiodarone 200 MG tablet  Commonly known as:  PACERONE  Take 1 tablet (200 mg total) by mouth 2 (two) times daily.     amLODipine 10 MG  tablet  Commonly known as:  NORVASC  Take 5 mg by mouth daily.     aspirin 81 MG tablet  Take 81 mg by mouth daily.     carvedilol 3.125 MG tablet  Commonly known as:  COREG  Take 3.125 mg by mouth 2 (two) times daily with a meal.     citalopram 10 MG tablet  Commonly known as:  CELEXA  Take 2 tablets (20 mg total) by mouth daily.     cloNIDine 0.1 mg/24hr patch  Commonly known as:  CATAPRES - Dosed in mg/24 hr  Place 0.1 mg onto the skin once a week.     clopidogrel 75 MG tablet  Commonly known as:  PLAVIX  Take 75 mg by mouth daily with breakfast.     digoxin 0.125 MG tablet  Commonly known as:  LANOXIN  Take 1 tablet (0.125 mg total) by mouth every other day.     docusate sodium 100 MG capsule  Commonly known as:  COLACE  Take 100 mg by mouth 2 (two) times daily.     ferrous sulfate 325 (65 FE) MG tablet  Take 325 mg by mouth daily with breakfast.     HYDROcodone-acetaminophen 5-325 MG per tablet  Commonly known as:  NORCO/VICODIN  Take one tablet by mouth every 4 hours as needed for pain     levothyroxine 50 MCG tablet  Commonly known as:  SYNTHROID, LEVOTHROID  Take 25 mcg by mouth daily before breakfast.     nitroGLYCERIN 0.4 MG SL tablet  Commonly known as:  NITROSTAT  Place 0.4 mg under the tongue every 5 (five) minutes as needed. For chest pain     polyethylene glycol packet  Commonly known as:  MIRALAX / GLYCOLAX  Take 17 g by mouth 2 (two) times daily.     ranitidine 150 MG capsule  Commonly known as:  ZANTAC  Take 150 mg by mouth every morning.     senna 8.6 MG Tabs tablet  Commonly known as:  SENOKOT  Take 1 tablet by mouth at bedtime.     Tiotropium Bromide Monohydrate 2.5 MCG/ACT Aers  Commonly known as:  SPIRIVA RESPIMAT  Inhale 2 Inhalers into the lungs daily. Use with aerochamber     torsemide 20 MG tablet  Commonly known as:  DEMADEX  Take 20 mg by mouth daily.     traZODone 50 MG tablet  Commonly known as:  DESYREL  Take 25 mg by  mouth at bedtime.     vitamin B-12 1000 MCG tablet  Commonly known as:  CYANOCOBALAMIN  Take 1,000 mcg by mouth daily.     zolpidem 6.25 MG CR tablet  Commonly known as:  AMBIEN CR  Take 6.25 mg by mouth at bedtime.        Physical exam BP 150/74 mmHg  Pulse 61  Temp(Src) 97.8 F (36.6 C)  Resp 18  Ht 5\' 5"  (1.651 m)  Wt 153 lb 9.6 oz (69.673 kg)  BMI 25.56 kg/m2  SpO2 95%  Wt Readings from Last 3 Encounters:  09/20/14 153 lb 9.6 oz (69.673 kg)  08/16/14 158 lb 8 oz (71.895 kg)  07/17/14 158 lb 3.2 oz (71.759 kg)   General- elderly female in no acute distress Head- atraumatic, normocephalic Eyes- PERRLA, EOMI, no pallor, no icterus, no discharge, wears glasses Ears- normal external ear canal Neck- no lymphadenopathy, no thyromegaly, no jugular vein distension, no carotid bruit Nose- normal nasaal mucosa, no maxillary sinus tenderness, no frontal sinus tenderness Mouth- normal mucus membrane, no oral thrush, normal oropharynx. Has upper and lower dentures Chest- no chest wall deformities, no chest wall tenderness Breast- refused Cardiovascular- normal s1,s2, no murmurs, normal distal pulses Respiratory- bilateral clear to auscultation, no wheeze, no rhonchi, no crackles Abdomen- bowel sounds present, soft, non tender, no organomegaly, no guarding or rigidity, no CVA tenderness Pelvic exam- refused Musculoskeletal- able to move all 4  extremities, trace lower leg edema, surgical scars in both knees, mobilize with wheelchair Neurological- no focal deficit, generalized weakness Skin- warm and dry Psychiatry- alert and oriented to person, place and time, normal mood and affect  Labs CBC Latest Ref Rng 09/11/2014 09/02/2014 04/22/2014  WBC 3.6-11.0 x10 3/mm  6.3 5.9 5.5  Hemoglobin 12.0-16.0 g/dL 12.5 13.0 10.8(A)  Hematocrit 35.0-47.0 % 37.5 39.2 35(A)  Platelets 150-440 x10 3/mm  304 339 261   CMP Latest Ref Rng 09/02/2014 05/22/2014 04/22/2014  Glucose 65-99 mg/dL - - -    BUN 4 - 21 mg/dL - 55(A) 51(A)  Creatinine - 2.65(H) 2.4(A) 2.5(A)  Sodium 137 - 147 mmol/L - 137 139  Potassium 3.4 - 5.3 mmol/L - 3.9 4.0  Chloride 98-107 mmol/L - - -  CO2 21-32 mmol/L - - -  Calcium - 8.6(L) - -  Total Protein 6.4-8.2 g/dL - - -  Albumin 3.5 - 4.7 g/dL - - -  Total Bilirubin 0.3 - 1.2 mg/dL - - -  Alkaline Phos 25 - 125 U/L - - -  AST 13 - 35 U/L - - -  ALT 7 - 35 U/L - - -   Lab Results  Component Value Date   HGBA1C 6.2* 07/19/2014   Lipid Panel     Component Value Date/Time   CHOL 118 08/20/2013   TRIG 61 08/20/2013   HDL 54 08/20/2013   CHOLHDL 2 12/24/2008 0913   VLDL 14.2 12/24/2008 0913   LDLCALC 52 08/20/2013   Lab Results  Component Value Date   TSH 4.22 04/22/2014    Assessment/plan  Weight loss Concern for protein calorie malnutrition. Check prealbumin level. Consider dietary consult if low. Monitor weight for now. Encourage po intake. Skin assessment per facility protocol  Fatigue Her medical co-morbidities, side effect of multiple medications and progressive ckd could all be contributing to this. Lab check as below. Will have her be evaluated by therapy team for restorative services  Chronic atrial fibrillation Rate-controlled. Continue digoxin 135mcg every other day and amiodarone 200 mg twice daily. Continue asa 81mg  daily. Check tsh level      Hypothyroidism Reviewed tsh from dec 2015. Continue levothyroxine 67mcg daily. Check tsh  Hypertensive heart and renal disease bp has been elevated. Has ckd stage 4 and DM which likely are contributing factors. on amlodipine 5 mg daily, carvedilol 3.125mg  twice daily, torsemide 20mg  daily and catapres 0.1mg /24hr patch once a week. Increase amlodipine to 10 mg daily and reassess  CAD Remains chest pain free. Continue asa 81mg  daily, plavix 75mg  daily, carvedilol 3.125 mg bid and prn NTG  ckd stage 4 Has refused dialysis, followed by renal.   GERD Stable. Continue zantac150mg   daily  DM2 controlled Recent A1c 6.2. Off all diabetic medication. Monitor cbg and a1c q3 month. Eye and foot exam are up-to-date. Not a candidate for ACEi/ARB due to CKD. Continue aspirin. Not on statin with impaired renal function.   Depression Mood stable, attempt GDR and decrease celexa to 10 mg daily for now and monitor  Insomnia Improving. Continue ambien cr 6.25mg  daily with trazodone 25mg  daily at bedtime. Continue to monitor her status.  Anemia of chronic disease Stable h&h in April 2016. Continue ferrous sulfate with stool softner  b12 deficiency Continue b12 1000 mcg daily  Chronic obstructive pulmonary disease No recent exacerbation. Continue spiriva 2 puffs daily and albuterol hfa 2 puffs every six hours as needed for shortness of breath and wheezing.    Osteoarthritis  Stable. Change norco 5/325mg  to q6h prn for pain. Continue laxative and stool softner to avoid constipation  Chronic diastolic congestive heart failure euvolemic on exam. EF 55-60% on Echo in 03/2012. Continue torsemide 20mg  daily, coreg 3.125mg  twice daily. Monitor weight  Annual exam Patient at her baseline. Weight loss to be monitored as above. uptodate with immunization, eye and foot exam.   Labs- tsh, lipid panel, prealbumin   Blanchie Serve, MD  [facility] [phone] (Monday-Friday 8 am - 5 pm) [phone] (afterhours)

## 2014-09-23 ENCOUNTER — Other Ambulatory Visit: Payer: Self-pay | Admitting: *Deleted

## 2014-09-23 MED ORDER — HYDROCODONE-ACETAMINOPHEN 5-325 MG PO TABS: ORAL_TABLET | ORAL | Status: AC

## 2014-09-23 NOTE — Telephone Encounter (Signed)
[facility] 

## 2014-09-24 LAB — TSH: TSH: 4.34 u[IU]/mL (ref 0.41–5.90)

## 2014-09-24 LAB — LIPID PANEL
CHOLESTEROL: 164 mg/dL (ref 0–200)
HDL: 53 mg/dL (ref 35–70)
LDL CALC: 88 mg/dL
TRIGLYCERIDES: 114 mg/dL (ref 40–160)

## 2014-10-01 ENCOUNTER — Other Ambulatory Visit: Payer: Self-pay

## 2014-10-01 DIAGNOSIS — D472 Monoclonal gammopathy: Secondary | ICD-10-CM

## 2014-10-02 ENCOUNTER — Inpatient Hospital Stay: Payer: Medicare Other | Attending: Internal Medicine

## 2014-10-02 DIAGNOSIS — D472 Monoclonal gammopathy: Secondary | ICD-10-CM | POA: Diagnosis not present

## 2014-10-02 LAB — CBC
HEMATOCRIT: 38.4 % (ref 35.0–47.0)
Hemoglobin: 12.7 g/dL (ref 12.0–16.0)
MCH: 32.7 pg (ref 26.0–34.0)
MCHC: 33 g/dL (ref 32.0–36.0)
MCV: 99.1 fL (ref 80.0–100.0)
Platelets: 318 10*3/uL (ref 150–440)
RBC: 3.88 MIL/uL (ref 3.80–5.20)
RDW: 13.9 % (ref 11.5–14.5)
WBC: 6.5 10*3/uL (ref 3.6–11.0)

## 2014-10-02 LAB — CREATININE, SERUM
Creatinine, Ser: 2.08 mg/dL — ABNORMAL HIGH (ref 0.44–1.00)
GFR calc Af Amer: 23 mL/min — ABNORMAL LOW (ref >60)
GFR, EST NON AFRICAN AMERICAN: 20 mL/min — AB (ref >60)

## 2014-10-03 LAB — PROTEIN ELECTROPHORESIS, SERUM
A/G RATIO SPE: 0.8 (ref 0.7–2.0)
ALBUMIN ELP: 3.2 g/dL (ref 3.2–5.6)
ALPHA-2-GLOBULIN: 0.7 g/dL (ref 0.4–1.2)
Alpha-1-Globulin: 0.2 g/dL (ref 0.1–0.4)
Beta Globulin: 0.9 g/dL (ref 0.6–1.3)
Gamma Globulin: 1.9 g/dL — ABNORMAL HIGH (ref 0.5–1.6)
Globulin, Total: 3.8 g/dL (ref 2.0–4.5)
M-Spike, %: 1.4 g/dL — ABNORMAL HIGH
TOTAL PROTEIN ELP: 7 g/dL (ref 6.0–8.5)

## 2014-10-03 LAB — KAPPA/LAMBDA LIGHT CHAINS
Kappa free light chain: 94.14 mg/L — ABNORMAL HIGH (ref 3.30–19.40)
Kappa, lambda light chain ratio: 6.13 — ABNORMAL HIGH (ref 0.26–1.65)
LAMDA FREE LIGHT CHAINS: 15.35 mg/L (ref 5.71–26.30)

## 2014-10-09 ENCOUNTER — Ambulatory Visit: Payer: Medicare Other | Admitting: Internal Medicine

## 2014-10-10 ENCOUNTER — Telehealth: Payer: Self-pay | Admitting: *Deleted

## 2014-10-10 NOTE — Telephone Encounter (Signed)
Patient missed her appt and has stated she does not want to come back. Dene would like someone to call her with the results of labs.

## 2014-10-11 NOTE — Telephone Encounter (Signed)
I returned [name]'s called at [phone] and informed her of her grandmother's lab results. She stated that her grandmother was aware of possibly having multiple myeloma, but would prefer to have a better quality care of life. She does not want to be bothered by medical visits, medications, and/or treatments. She requested a copy of her grandmother's lab results; I stated that I would place them in the mail today.Marland KitchenMarland Kitchen

## 2014-10-17 ENCOUNTER — Ambulatory Visit: Payer: Medicare Other | Admitting: Family Medicine

## 2014-11-04 LAB — BASIC METABOLIC PANEL WITH GFR
BUN: 33 mg/dL — AB (ref 4–21)
Creatinine: 2.2 mg/dL — AB (ref 0.5–1.1)
Glucose: 82 mg/dL
Potassium: 4.8 mmol/L (ref 3.4–5.3)
Sodium: 140 mmol/L (ref 137–147)

## 2014-11-25 ENCOUNTER — Non-Acute Institutional Stay (SKILLED_NURSING_FACILITY): Payer: Medicare Other | Admitting: Nurse Practitioner

## 2014-11-25 ENCOUNTER — Encounter: Payer: Self-pay | Admitting: Nurse Practitioner

## 2014-11-25 DIAGNOSIS — I110 Hypertensive heart disease with heart failure: Secondary | ICD-10-CM | POA: Diagnosis not present

## 2014-11-25 DIAGNOSIS — I481 Persistent atrial fibrillation: Secondary | ICD-10-CM

## 2014-11-25 DIAGNOSIS — I509 Heart failure, unspecified: Secondary | ICD-10-CM

## 2014-11-25 DIAGNOSIS — I4819 Other persistent atrial fibrillation: Secondary | ICD-10-CM

## 2014-11-25 DIAGNOSIS — F329 Major depressive disorder, single episode, unspecified: Secondary | ICD-10-CM | POA: Diagnosis not present

## 2014-11-25 DIAGNOSIS — F32A Depression, unspecified: Secondary | ICD-10-CM

## 2014-11-25 DIAGNOSIS — I5032 Chronic diastolic (congestive) heart failure: Secondary | ICD-10-CM | POA: Diagnosis not present

## 2014-11-25 DIAGNOSIS — J449 Chronic obstructive pulmonary disease, unspecified: Secondary | ICD-10-CM | POA: Diagnosis not present

## 2014-11-25 DIAGNOSIS — E039 Hypothyroidism, unspecified: Secondary | ICD-10-CM

## 2014-11-25 NOTE — Progress Notes (Signed)
Patient ID: [name], female   DOB: [birth year], 81 y.o.   MRN: [id number]    Nursing Home Location:  [place] of Service: SNF ([id number])  PCP: Blanchie Serve, MD  Allergies  Allergen Reactions  . Naproxen     REACTION: gastritis  . Nsaids     Chief Complaint  Patient presents with  . Medical Management of Chronic Issues    HPI:  Patient is a 81 y.o. female seen today at [facility] for routine follow up on chronic conditions. Pt with a pmh of follow CAD, CHF- diastolic, HTN, COPD, HLD. pts weight is up 2 lb since last visit.  There has been no acute issues noted by nursing. Pt activity remaining at baseline. Had followed with oncology due to evaluation of myeloma however per epic notes pt does not wish to pursue further workup or treatment.  Pt reports worsening LE edema. No worsening shortness of breath or chest pains noted. Left leg worse than right. Staff notes pt will have LE edema at times Staff without acute concerns   Review of Systems:  Review of Systems  Constitutional: Negative for activity change, appetite change, fatigue and unexpected weight change.  HENT: Negative for congestion and hearing loss.   Eyes: Negative.   Respiratory: Negative for cough and shortness of breath.   Cardiovascular: Negative for chest pain, palpitations and leg swelling.  Gastrointestinal: Negative for abdominal pain, diarrhea and constipation.  Genitourinary: Negative for dysuria and difficulty urinating.  Musculoskeletal: Negative for myalgias and arthralgias.  Skin: Negative for color change and wound.  Neurological: Negative for dizziness and weakness.  Psychiatric/Behavioral: Negative for behavioral problems, confusion and agitation.    Past Medical History  Diagnosis Date  . Coronary artery disease     a. Acute MI 2003, stent to LCx. b. NSTEMI 2008 (cath w/o PCI).  c. +trop 02/2011 felt 2/2 demand ischemia.  [name] History of atrial  flutter 2009    a. s/p ablation of typical flutter 09/2007.  [name] Paroxysmal atrial fibrillation     a. Dx 2009. b. Discontinuation of Coumadin 01/2008 2/2 hemarthrosis/chronic debilitation.  [name] COPD (chronic obstructive pulmonary disease)   . Hyperlipidemia   . Diabetes mellitus   . Diastolic congestive heart failure   . Arthritis   . Chronic kidney disease   . Carotid artery disease     a. Bilateral  . SSS (sick sinus syndrome)     a. Symptomatic bradycardia/syncope and post-termination pauses - St. Jude dual chamber PPM 09/2007.   [name] Pneumonia   . Anxiety     adjustment disorder  . Pacemaker   . Mesenteric ischemia     a. Possible dx, 2013.  . Diverticula of colon     a. Colonoscopy 01/2012.  [name] PVD (peripheral vascular disease)     Carotid dz as noted, repoted aortoiliac occlusive dz, also reported high-grade stenosis at the origin & prox innominate artery, high-grade stenosis of the right carotid bifurcation, moderate stenosis of the proximal prevertebral left subclavian artery by CT 02/2011  . Pituitary tumor     s/p gamma knife  . Tachy-brady syndrome   . ?? Subclavian artery stenosis, right 04/04/2012  . SMA stenosis-moderate 04/04/2012  . Diverticulosis of colon (without mention of hemorrhage) 01/27/2012  . 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 CAROTID BRUIT, RIGHT 04/26/2007  Qualifier: Diagnosis of  By: Maxie Better FNP, Rosalita Levan   . Cardiac pacemaker in situ 09/15/2009    Qualifier: Diagnosis of  By: Lovena Le, MD, Martyn Malay   . GERD (gastroesophageal reflux disease) 08/09/2013  . Pressure ulcer, heel 06/28/2013   Past Surgical History  Procedure Laterality Date  . Cardiac catheterization      2003 cardiac stent  . Insert / replace / remove pacemaker  2009    St. Jude  . Cholecystectomy    . Total knee arthroplasty      bilateral  . Bilateral hip arthroscopy    . Tumor removed       pituitary-[facility]-benign 12/02. 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 Myocardial perfusion study      EF 59% 4/04  . Adenosine myoview study      abn EF 53% 12/04/04  . Carotid doppler  2/99  . Echo with lvh  1/98    old records  . Stress cardiolite  3/04    EF 53%  . Gamma knife radiosurg  08/25/06    [facility]  . Pvd      with stent in the right iliac   . Colonoscopy  01/27/2012    Procedure: COLONOSCOPY;  Surgeon: Inda Castle, MD;  Location: [place];  Service: Endoscopy;  Laterality: N/A;  . Abdominal aortagram N/A 01/25/2012    Procedure: ABDOMINAL AORTAGRAM;  Surgeon: Serafina Mitchell, MD;  Location: [facility] CATH LAB;  Service: Cardiovascular;  Laterality: N/A;   Social History:   reports that she has been smoking Cigarettes.  She has a 10 pack-year smoking history. Her smokeless tobacco use includes Snuff. She reports that she does not drink alcohol or use illicit drugs.  Family History  Problem Relation Age of Onset  . Cancer Neg Hx   . Depression      ?  . Diabetes      DM and HBP- family hx    Medications: Patient's Medications  New Prescriptions   No medications on file  Previous Medications   ALBUTEROL (PROVENTIL HFA;VENTOLIN HFA) 108 (90 BASE) MCG/ACT INHALER    Inhale 2 puffs into the lungs every 6 (six) hours as needed for wheezing or shortness of breath.   AMINO ACIDS-PROTEIN HYDROLYS (FEEDING SUPPLEMENT, PRO-STAT SUGAR FREE 64,) LIQD    Take 30 mLs by mouth 2 (two) times daily. For wound healing   AMIODARONE (PACERONE) 200 MG TABLET    Take 1 tablet (200 mg total) by mouth 2 (two) times daily.   AMLODIPINE (NORVASC) 10 MG TABLET    Take 10 mg by mouth daily.    ASPIRIN 81 MG TABLET    Take 81 mg by mouth daily.   CARVEDILOL (COREG) 3.125 MG TABLET    Take 3.125 mg by mouth 2 (two) times daily with a meal.   CLONIDINE (CATAPRES - DOSED IN MG/24 HR) 0.1 MG/24HR PATCH    Place 0.1 mg onto the skin once a week.   CLOPIDOGREL (PLAVIX) 75 MG TABLET    Take 75 mg by  mouth daily with breakfast.   DIGOXIN (LANOXIN) 0.125 MG TABLET    Take 1 tablet (0.125 mg total) by mouth every other day.   DOCUSATE SODIUM (COLACE) 100 MG CAPSULE    Take 100 mg by mouth 2 (two) times daily.    FERROUS SULFATE 325 (65 FE) MG TABLET    Take 325 mg by mouth daily with breakfast.    HYDROCODONE-ACETAMINOPHEN (NORCO/VICODIN) 5-325 MG PER TABLET  Take one tablet by mouth every six hours as needed for pain. Do not exceed 4gm in 24 hours   LEVOTHYROXINE (SYNTHROID, LEVOTHROID) 25 MCG TABLET    Take 25 mcg by mouth daily before breakfast.    POLYETHYLENE GLYCOL (MIRALAX / GLYCOLAX) PACKET    Take 17 g by mouth 2 (two) times daily.   PROPYLENE GLYCOL (SYSTANE BALANCE) 0.6 % SOLN    Apply 1 drop to eye 3 (three) times daily. Both eyes for dry eyes   RANITIDINE (ZANTAC) 150 MG CAPSULE    Take 150 mg by mouth every morning.   SENNA (SENOKOT) 8.6 MG TABS TABLET    Take 1 tablet by mouth at bedtime.   TIOTROPIUM BROMIDE MONOHYDRATE (SPIRIVA RESPIMAT) 2.5 MCG/ACT AERS    Inhale 2 Inhalers into the lungs daily. Use with aerochamber   TORSEMIDE (DEMADEX) 20 MG TABLET    Take 20 mg by mouth daily.   TRAZODONE (DESYREL) 50 MG TABLET    Take 25 mg by mouth at bedtime.    VITAMIN B-12 (CYANOCOBALAMIN) 1000 MCG TABLET    Take 1,000 mcg by mouth daily.   ZOLPIDEM (AMBIEN CR) 6.25 MG CR TABLET    Take 6.25 mg by mouth at bedtime.  Modified Medications   Modified Medication Previous Medication   CITALOPRAM (CELEXA) 10 MG TABLET citalopram (CELEXA) 10 MG tablet      Take 1 tablet (10 mg total) by mouth daily.    Take 2 tablets (20 mg total) by mouth daily.  Discontinued Medications   LEVOTHYROXINE (SYNTHROID, LEVOTHROID) 50 MCG TABLET    Take 25 mcg by mouth daily before breakfast.    NITROGLYCERIN (NITROSTAT) 0.4 MG SL TABLET    Place 0.4 mg under the tongue every 5 (five) minutes as needed. For chest pain     Physical Exam: Filed Vitals:   11/25/14 1506  BP: 152/49  Pulse: 65  Temp: 98.4  F (36.9 C)  TempSrc: Oral  Resp: 24  Height: 5\' 5"  (1.651 m)  Weight: 156 lb 6.4 oz (70.943 kg)  SpO2: 95%    Physical Exam  Constitutional: No distress.  Frail elderly female  HENT:  Head: Normocephalic and atraumatic.  Mouth/Throat: Oropharynx is clear and moist. No oropharyngeal exudate.  Neck: Neck supple.  Cardiovascular: Normal rate and normal heart sounds.  An irregular rhythm present.  Pulmonary/Chest: Effort normal and breath sounds normal.  Abdominal: Soft. Bowel sounds are normal.  Musculoskeletal: She exhibits edema (left worse than right pitting edema). She exhibits no tenderness.  Self propels in [facility]  Neurological: She is alert.  Skin: Skin is warm and dry. She is not diaphoretic.  Psychiatric: She has a normal mood and affect.    Labs reviewed: Basic Metabolic Panel:  Recent Labs  04/22/14 05/22/14 09/02/14 1422 10/02/14 1511 11/04/14  NA 139 137  --   --  140  K 4.0 3.9  --   --  4.8  BUN 51* 55*  --   --  33*  CREATININE 2.5* 2.4* 2.65* 2.08* 2.2*  CALCIUM  --   --  8.6*  --   --    Liver Function Tests: No results for input(s): AST, ALT, ALKPHOS, BILITOT, PROT, ALBUMIN in the last 8760 hours. No results for input(s): LIPASE, AMYLASE in the last 8760 hours. No results for input(s): AMMONIA in the last 8760 hours. CBC:  Recent Labs  09/02/14 1422 09/11/14 1350 10/02/14 1511  WBC 5.9 6.3 6.5  NEUTROABS 4.2 4.6  --  HGB 13.0 12.5 12.7  HCT 39.2 37.5 38.4  MCV 99 99 99.1  PLT 339 304 318   TSH:  Recent Labs  04/22/14 09/24/14  TSH 4.22 4.34   A1C: Lab Results  Component Value Date   HGBA1C 6.2* 07/19/2014   Lipid Panel:  Recent Labs  09/24/14  CHOL 164  HDL 53  LDLCALC 88  TRIG 114      Assessment/Plan 1. Depression -tolerating dose reduction - citalopram (CELEXA) 10 MG tablet; Take 1 tablet (10 mg total) by mouth daily.  Dispense: 60 tablet; Refill: 11  2. Chronic obstructive pulmonary disease, unspecified COPD,  unspecified chronic bronchitis type -COPD has been stable, no recent exacerbations, cont current regimen  3. Chronic diastolic congestive heart failure -some LE edema, per staff this happens occasionally, however left more swollen than right due to hx of afib will get doppler to rule out DVT - conts on Demadex daily -weights have been stable  4. Persistent atrial fibrillation Rate controlled on dig, amiodarone, conts on ASA 81 mg daily -will get dig level  5. Hypertensive heart disease with heart failure -stable, blood pressure elevated on recent reading however reviewed blood pressure and reading variable from 109-154/50s. Will cont current regimen at this time  6. Hypothyroidism, unspecified hypothyroidism type -stable, TSH 4.33 on labs in may     Exeter K. Harle Battiest  [facility] [phone] 8 am - 5 pm) [phone] (after hours)

## 2014-12-24 ENCOUNTER — Non-Acute Institutional Stay (SKILLED_NURSING_FACILITY): Payer: Medicare Other | Admitting: Internal Medicine

## 2014-12-24 DIAGNOSIS — K219 Gastro-esophageal reflux disease without esophagitis: Secondary | ICD-10-CM

## 2014-12-24 DIAGNOSIS — E1122 Type 2 diabetes mellitus with diabetic chronic kidney disease: Secondary | ICD-10-CM

## 2014-12-24 DIAGNOSIS — N189 Chronic kidney disease, unspecified: Secondary | ICD-10-CM

## 2014-12-24 DIAGNOSIS — I509 Heart failure, unspecified: Secondary | ICD-10-CM

## 2014-12-24 DIAGNOSIS — N184 Chronic kidney disease, stage 4 (severe): Secondary | ICD-10-CM | POA: Diagnosis not present

## 2014-12-24 DIAGNOSIS — I130 Hypertensive heart and chronic kidney disease with heart failure and stage 1 through stage 4 chronic kidney disease, or unspecified chronic kidney disease: Secondary | ICD-10-CM | POA: Diagnosis not present

## 2014-12-24 DIAGNOSIS — D638 Anemia in other chronic diseases classified elsewhere: Secondary | ICD-10-CM | POA: Diagnosis not present

## 2014-12-24 DIAGNOSIS — IMO0001 Reserved for inherently not codable concepts without codable children: Secondary | ICD-10-CM

## 2014-12-24 NOTE — Progress Notes (Signed)
Patient ID: [name], female   DOB: 1924/03/13, 81 y.o.   MRN: [id number]    Facility: [facility]   Chief Complaint  Patient presents with  . Medical Management of Chronic Issues   Code status: DNR  HPI 81 y/o female patient is seen for routine visit. She has been at her baseline. No new concern from patient or staff.  No recent fall or skin concerns reported. No change in behaviors or functional status reported.   Review of Systems  Constitutional: Negative for fever, chills, diaphoresis.  HENT: Negative for congestion, hearing loss and sore throat.   Eyes: Negative for blurred vision, double vision and discharge.  Respiratory: Negative for cough, sputum production, shortness of breath and wheezing.   Cardiovascular: Negative for chest pain, palpitations. Has chronic leg swelling.  Gastrointestinal: Negative for heartburn, nausea, vomiting, abdominal pain. had bowel movement yesterday. Appetite is good Genitourinary: Negative for dysuria, flank pain.  Musculoskeletal: Negative for back pain, falls. gets around with wheelchair Skin: Negative for itching and rash.  Neurological: Negative for dizziness Psychiatric/Behavioral: Negative for depression.  Past Medical History  Diagnosis Date  . Coronary artery disease     a. Acute MI 2003, stent to LCx. b. NSTEMI 2008 (cath w/o PCI).  c. +trop 02/2011 felt 2/2 demand ischemia.  [name] History of atrial flutter 2009    a. s/p ablation of typical flutter 09/2007.  [name] Paroxysmal atrial fibrillation     a. Dx 2009. b. Discontinuation of Coumadin 01/2008 2/2 hemarthrosis/chronic debilitation.  [name] COPD (chronic obstructive pulmonary disease)   . Hyperlipidemia   . Diabetes mellitus   . Diastolic congestive heart failure   . Arthritis   . Chronic kidney disease   . Carotid artery disease     a. Bilateral  . SSS (sick sinus syndrome)     a. Symptomatic bradycardia/syncope and post-termination pauses - St. Jude  dual chamber PPM 09/2007.   [name] Pneumonia   . Anxiety     adjustment disorder  . Pacemaker   . Mesenteric ischemia     a. Possible dx, 2013.  . Diverticula of colon     a. Colonoscopy 01/2012.  [name] PVD (peripheral vascular disease)     Carotid dz as noted, repoted aortoiliac occlusive dz, also reported high-grade stenosis at the origin & prox innominate artery, high-grade stenosis of the right carotid bifurcation, moderate stenosis of the proximal prevertebral left subclavian artery by CT 02/2011  . Pituitary tumor     s/p gamma knife  . Tachy-brady syndrome   . ?? Subclavian artery stenosis, right 04/04/2012  . SMA stenosis-moderate 04/04/2012  . Diverticulosis of colon (without mention of hemorrhage) 01/27/2012  . 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 CAROTID BRUIT, RIGHT 04/26/2007    Qualifier: Diagnosis of  By: Maxie Better FNP, Rosalita Levan   . Cardiac pacemaker in situ 09/15/2009    Qualifier: Diagnosis of  By: Lovena Le, MD, Martyn Malay   . GERD (gastroesophageal reflux disease) 08/09/2013  . Pressure ulcer, heel 06/28/2013       Medication List       This list is accurate as of: 12/24/14  4:35 PM.  Always use your most recent med list.               albuterol 108 (90 BASE) MCG/ACT inhaler  Commonly known as:  PROVENTIL HFA;VENTOLIN HFA  Inhale  2 puffs into the lungs every 6 (six) hours as needed for wheezing or shortness of breath.     amiodarone 200 MG tablet  Commonly known as:  PACERONE  Take 1 tablet (200 mg total) by mouth 2 (two) times daily.     amLODipine 10 MG tablet  Commonly known as:  NORVASC  Take 10 mg by mouth daily.     aspirin 81 MG tablet  Take 81 mg by mouth daily.     carvedilol 3.125 MG tablet  Commonly known as:  COREG  Take 3.125 mg by mouth 2 (two) times daily with a meal.     citalopram 10 MG tablet  Commonly known as:  CELEXA  Take 1 tablet (10 mg  total) by mouth daily.     cloNIDine 0.1 mg/24hr patch  Commonly known as:  CATAPRES - Dosed in mg/24 hr  Place 0.1 mg onto the skin once a week.     clopidogrel 75 MG tablet  Commonly known as:  PLAVIX  Take 75 mg by mouth daily with breakfast.     digoxin 0.125 MG tablet  Commonly known as:  LANOXIN  Take 1 tablet (0.125 mg total) by mouth every other day.     docusate sodium 100 MG capsule  Commonly known as:  COLACE  Take 100 mg by mouth 2 (two) times daily.     feeding supplement (PRO-STAT SUGAR FREE 64) Liqd  Take 30 mLs by mouth 2 (two) times daily. For wound healing     ferrous sulfate 325 (65 FE) MG tablet  Take 325 mg by mouth daily with breakfast.     HYDROcodone-acetaminophen 5-325 MG per tablet  Commonly known as:  NORCO/VICODIN  Take one tablet by mouth every six hours as needed for pain. Do not exceed 4gm in 24 hours     levothyroxine 25 MCG tablet  Commonly known as:  SYNTHROID, LEVOTHROID  Take 25 mcg by mouth daily before breakfast.     polyethylene glycol packet  Commonly known as:  MIRALAX / GLYCOLAX  Take 17 g by mouth 2 (two) times daily.     ranitidine 150 MG capsule  Commonly known as:  ZANTAC  Take 150 mg by mouth every morning.     senna 8.6 MG Tabs tablet  Commonly known as:  SENOKOT  Take 1 tablet by mouth at bedtime.     SYSTANE BALANCE 0.6 % Soln  Generic drug:  Propylene Glycol  Apply 1 drop to eye 3 (three) times daily. Both eyes for dry eyes     Tiotropium Bromide Monohydrate 2.5 MCG/ACT Aers  Commonly known as:  SPIRIVA RESPIMAT  Inhale 2 Inhalers into the lungs daily. Use with aerochamber     torsemide 20 MG tablet  Commonly known as:  DEMADEX  Take 20 mg by mouth daily.     traZODone 50 MG tablet  Commonly known as:  DESYREL  Take 25 mg by mouth at bedtime.     vitamin B-12 1000 MCG tablet  Commonly known as:  CYANOCOBALAMIN  Take 1,000 mcg by mouth daily.     zolpidem 6.25 MG CR tablet  Commonly known as:  AMBIEN CR   Take 6.25 mg by mouth at bedtime.        Physical exam BP 139/63 mmHg  Pulse 69  Temp(Src) 97.4 F (36.3 C)  Resp 16  SpO2 96%  General- elderly female in no acute distress Head- atraumatic, normocephalic Eyes- PERRLA, EOMI, no pallor, no  icterus, no discharge, wears glasses Neck- no lymphadenopathy Mouth- normal mucus membrane Cardiovascular- normal s1,s2, no murmurs, normal distal pulses Respiratory- bilateral clear to auscultation, no wheeze, no rhonchi, no crackles Abdomen- bowel sounds present, soft, non tender Musculoskeletal- able to move all 4 extremities, trace lower leg edema, surgical scars in both knees, mobilize with wheelchair Neurological- no focal deficit, generalized weakness Skin- warm and dry Psychiatry- alert and oriented, normal mood and affect   Assessment/plan  Hypertensive heart and renal disease on amlodipine 10 mg daily, carvedilol 3.125mg  twice daily, torsemide 20mg  daily and catapres 0.1mg /24hr patch once a week. Stable, monitor  ckd stage 4 Has refused dialysis, followed by renal.   GERD Stable. Continue zantac150mg  daily  Anemia of chronic disease Continue ferrous sulfate with stool softner and monitor h&h. Continue b12 supplement   Blanchie Serve, MD  [facility] [phone] (Monday-Friday 8 am - 5 pm) [phone] (afterhours)

## 2015-01-15 DIAGNOSIS — IMO0001 Reserved for inherently not codable concepts without codable children: Secondary | ICD-10-CM | POA: Insufficient documentation

## 2015-01-23 ENCOUNTER — Non-Acute Institutional Stay (SKILLED_NURSING_FACILITY): Payer: Medicare Other | Admitting: Internal Medicine

## 2015-01-23 DIAGNOSIS — E1122 Type 2 diabetes mellitus with diabetic chronic kidney disease: Secondary | ICD-10-CM | POA: Diagnosis not present

## 2015-01-23 DIAGNOSIS — E039 Hypothyroidism, unspecified: Secondary | ICD-10-CM | POA: Diagnosis not present

## 2015-01-23 DIAGNOSIS — N184 Chronic kidney disease, stage 4 (severe): Secondary | ICD-10-CM | POA: Diagnosis not present

## 2015-01-23 DIAGNOSIS — N189 Chronic kidney disease, unspecified: Secondary | ICD-10-CM

## 2015-01-23 DIAGNOSIS — D509 Iron deficiency anemia, unspecified: Secondary | ICD-10-CM | POA: Diagnosis not present

## 2015-01-23 DIAGNOSIS — K5901 Slow transit constipation: Secondary | ICD-10-CM | POA: Diagnosis not present

## 2015-01-23 DIAGNOSIS — I481 Persistent atrial fibrillation: Secondary | ICD-10-CM

## 2015-01-23 DIAGNOSIS — I130 Hypertensive heart and chronic kidney disease with heart failure and stage 1 through stage 4 chronic kidney disease, or unspecified chronic kidney disease: Secondary | ICD-10-CM | POA: Diagnosis not present

## 2015-01-23 DIAGNOSIS — I509 Heart failure, unspecified: Secondary | ICD-10-CM

## 2015-01-23 DIAGNOSIS — I4819 Other persistent atrial fibrillation: Secondary | ICD-10-CM

## 2015-01-23 DIAGNOSIS — IMO0001 Reserved for inherently not codable concepts without codable children: Secondary | ICD-10-CM

## 2015-01-23 NOTE — Progress Notes (Signed)
Patient ID: [name], female   DOB: [birth year], 81 y.o.   MRN: [id number]      Facility: [facility]   Chief Complaint  Patient presents with  . Medical Management of Chronic Issues   Code status: DNR  HPI 81 y/o female patient is seen for routine visit. She has been at her baseline. No new concern from patient or staff.  No recent fall or skin concerns reported. No change in behaviors or functional status reported.   Review of Systems  Constitutional: Negative for fever, chills, diaphoresis.  HENT: Negative for congestion, hearing loss and sore throat.   Eyes: Negative for blurred vision, double vision and discharge.  Respiratory: Negative for cough, sputum production, shortness of breath and wheezing.   Cardiovascular: Negative for chest pain, palpitations. Has chronic leg swelling.  Gastrointestinal: Negative for heartburn, nausea, vomiting, abdominal pain.  Appetite is good Genitourinary: Negative for dysuria, flank pain.  Musculoskeletal: Negative for back pain, falls. gets around with wheelchair Skin: Negative for itching and rash.  Neurological: Negative for dizziness Psychiatric/Behavioral: Negative for depression.  Past Medical History  Diagnosis Date  . Coronary artery disease     a. Acute MI 2003, stent to LCx. b. NSTEMI 2008 (cath w/o PCI).  c. +trop 02/2011 felt 2/2 demand ischemia.  [name] History of atrial flutter 2009    a. s/p ablation of typical flutter 09/2007.  [name] Paroxysmal atrial fibrillation     a. Dx 2009. b. Discontinuation of Coumadin 01/2008 2/2 hemarthrosis/chronic debilitation.  [name] COPD (chronic obstructive pulmonary disease)   . Hyperlipidemia   . Diabetes mellitus   . Diastolic congestive heart failure   . Arthritis   . Chronic kidney disease   . Carotid artery disease     a. Bilateral  . SSS (sick sinus syndrome)     a. Symptomatic bradycardia/syncope and post-termination pauses - St. Jude dual chamber PPM 09/2007.    [name] Pneumonia   . Anxiety     adjustment disorder  . Pacemaker   . Mesenteric ischemia     a. Possible dx, 2013.  . Diverticula of colon     a. Colonoscopy 01/2012.  [name] PVD (peripheral vascular disease)     Carotid dz as noted, repoted aortoiliac occlusive dz, also reported high-grade stenosis at the origin & prox innominate artery, high-grade stenosis of the right carotid bifurcation, moderate stenosis of the proximal prevertebral left subclavian artery by CT 02/2011  . Pituitary tumor     s/p gamma knife  . Tachy-brady syndrome   . ?? Subclavian artery stenosis, right 04/04/2012  . SMA stenosis-moderate 04/04/2012  . Diverticulosis of colon (without mention of hemorrhage) 01/27/2012  . 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 CAROTID BRUIT, RIGHT 04/26/2007    Qualifier: Diagnosis of  By: Maxie Better FNP, Rosalita Levan   . Cardiac pacemaker in situ 09/15/2009    Qualifier: Diagnosis of  By: Lovena Le, MD, Martyn Malay   . GERD (gastroesophageal reflux disease) 08/09/2013  . Pressure ulcer, heel 06/28/2013       Medication List       This list is accurate as of: 01/23/15  4:44 PM.  Always use your most recent med list.               albuterol 108 (90 BASE) MCG/ACT inhaler  Commonly known as:  PROVENTIL HFA;VENTOLIN HFA  Inhale 2  puffs into the lungs every 6 (six) hours as needed for wheezing or shortness of breath.     amiodarone 200 MG tablet  Commonly known as:  PACERONE  Take 1 tablet (200 mg total) by mouth 2 (two) times daily.     amLODipine 10 MG tablet  Commonly known as:  NORVASC  Take 10 mg by mouth daily.     aspirin 81 MG tablet  Take 81 mg by mouth daily.     carvedilol 3.125 MG tablet  Commonly known as:  COREG  Take 3.125 mg by mouth 2 (two) times daily with a meal.     citalopram 10 MG tablet  Commonly known as:  CELEXA  Take 1 tablet (10 mg total) by mouth daily.      cloNIDine 0.1 mg/24hr patch  Commonly known as:  CATAPRES - Dosed in mg/24 hr  Place 0.1 mg onto the skin once a week.     clopidogrel 75 MG tablet  Commonly known as:  PLAVIX  Take 75 mg by mouth daily with breakfast.     digoxin 0.125 MG tablet  Commonly known as:  LANOXIN  Take 1 tablet (0.125 mg total) by mouth every other day.     docusate sodium 100 MG capsule  Commonly known as:  COLACE  Take 100 mg by mouth 2 (two) times daily.     feeding supplement (PRO-STAT SUGAR FREE 64) Liqd  Take 30 mLs by mouth 2 (two) times daily. For wound healing     ferrous sulfate 325 (65 FE) MG tablet  Take 325 mg by mouth daily with breakfast.     HYDROcodone-acetaminophen 5-325 MG per tablet  Commonly known as:  NORCO/VICODIN  Take one tablet by mouth every six hours as needed for pain. Do not exceed 4gm in 24 hours     levothyroxine 25 MCG tablet  Commonly known as:  SYNTHROID, LEVOTHROID  Take 25 mcg by mouth daily before breakfast.     polyethylene glycol packet  Commonly known as:  MIRALAX / GLYCOLAX  Take 17 g by mouth 2 (two) times daily.     ranitidine 150 MG capsule  Commonly known as:  ZANTAC  Take 150 mg by mouth every morning.     senna 8.6 MG Tabs tablet  Commonly known as:  SENOKOT  Take 1 tablet by mouth at bedtime.     SYSTANE BALANCE 0.6 % Soln  Generic drug:  Propylene Glycol  Apply 1 drop to eye 3 (three) times daily. Both eyes for dry eyes     Tiotropium Bromide Monohydrate 2.5 MCG/ACT Aers  Commonly known as:  SPIRIVA RESPIMAT  Inhale 2 Inhalers into the lungs daily. Use with aerochamber     torsemide 20 MG tablet  Commonly known as:  DEMADEX  Take 20 mg by mouth daily.     traZODone 50 MG tablet  Commonly known as:  DESYREL  Take 25 mg by mouth at bedtime.     vitamin B-12 1000 MCG tablet  Commonly known as:  CYANOCOBALAMIN  Take 1,000 mcg by mouth daily.     zolpidem 6.25 MG CR tablet  Commonly known as:  AMBIEN CR  Take 6.25 mg by mouth at  bedtime.        Physical exam BP 160/64 mmHg  Pulse 61  Temp(Src) 97.9 F (36.6 C)  Resp 16  SpO2 98%  General- elderly female in no acute distress Head- atraumatic, normocephalic Eyes- PERRLA, EOMI, no pallor, no icterus,  no discharge, wears glasses Neck- no lymphadenopathy Mouth- normal mucus membrane Cardiovascular- normal s1,s2, no murmurs, normal distal pulses Respiratory- bilateral clear to auscultation, no wheeze, no rhonchi, no crackles Abdomen- bowel sounds present, soft, non tender Musculoskeletal- able to move all 4 extremities, trace lower leg edema, surgical scars in both knees, mobilize with wheelchair Neurological- no focal deficit, generalized weakness Skin- warm and dry Psychiatry- alert and oriented, normal mood and affect  Labs 01/02/15 a1c 6.1 11/26/14 digoxin 1.6 11/05/14 na 140, k 4.8, bun 33, cr 2.15, gfr 23 09/24/14 t.chol 164, tg 114, ldl 88, hdl 53, tsh 4.33, prealbumin 21   Assessment/plan  Hypertensive heart and renal disease Elevated SBP. Currently on amlodipine 10 mg daily, carvedilol 3.125mg  twice daily, torsemide 20mg  daily and catapres 0.1mg /24hr patch once a week. Increase clonidine to 0.2 mg/24 hr patch for now and monitor bp reading  Hypothyroidism Check tsh. contniue levothyroxine 25 mcg daily for now  afib Rate controlled. Continue current regimen of coreg, amiodarone and digoxin, digoxin level therapeutic, monitor. Continue baby aspirin. Check tsh  Iron deficiency anemia No recent cbc for review. Continue ferrous sulfate 325 mg daily and check cbc  Constipation Stable, continue colace and miralax for now  ckd stage 4 Has refused dialysis, followed by renal.    Blanchie Serve, MD  [facility] [phone] (Monday-Friday 8 am - 5 pm) [phone] (afterhours)

## 2015-02-13 LAB — BASIC METABOLIC PANEL
BUN: 42 mg/dL — AB (ref 4–21)
CREATININE: 2 mg/dL — AB (ref 0.5–1.1)
Glucose: 100 mg/dL
Potassium: 4.3 mmol/L (ref 3.4–5.3)
SODIUM: 139 mmol/L (ref 137–147)

## 2015-02-13 LAB — CBC AND DIFFERENTIAL
HCT: 34 % — AB (ref 36–46)
Hemoglobin: 11 g/dL — AB (ref 12.0–16.0)
Platelets: 250 10*3/uL (ref 150–399)
WBC: 6.2 10^3/mL

## 2015-02-19 ENCOUNTER — Non-Acute Institutional Stay (SKILLED_NURSING_FACILITY): Payer: Medicare Other | Admitting: Nurse Practitioner

## 2015-02-19 DIAGNOSIS — K5901 Slow transit constipation: Secondary | ICD-10-CM | POA: Diagnosis not present

## 2015-02-19 DIAGNOSIS — I509 Heart failure, unspecified: Secondary | ICD-10-CM | POA: Diagnosis not present

## 2015-02-19 DIAGNOSIS — G4700 Insomnia, unspecified: Secondary | ICD-10-CM | POA: Diagnosis not present

## 2015-02-19 DIAGNOSIS — F329 Major depressive disorder, single episode, unspecified: Secondary | ICD-10-CM | POA: Diagnosis not present

## 2015-02-19 DIAGNOSIS — D638 Anemia in other chronic diseases classified elsewhere: Secondary | ICD-10-CM

## 2015-02-19 DIAGNOSIS — IMO0001 Reserved for inherently not codable concepts without codable children: Secondary | ICD-10-CM

## 2015-02-19 DIAGNOSIS — I481 Persistent atrial fibrillation: Secondary | ICD-10-CM | POA: Diagnosis not present

## 2015-02-19 DIAGNOSIS — E039 Hypothyroidism, unspecified: Secondary | ICD-10-CM | POA: Diagnosis not present

## 2015-02-19 DIAGNOSIS — R634 Abnormal weight loss: Secondary | ICD-10-CM | POA: Diagnosis not present

## 2015-02-19 DIAGNOSIS — N189 Chronic kidney disease, unspecified: Secondary | ICD-10-CM

## 2015-02-19 DIAGNOSIS — I4819 Other persistent atrial fibrillation: Secondary | ICD-10-CM

## 2015-02-19 DIAGNOSIS — F32A Depression, unspecified: Secondary | ICD-10-CM

## 2015-02-19 DIAGNOSIS — I130 Hypertensive heart and chronic kidney disease with heart failure and stage 1 through stage 4 chronic kidney disease, or unspecified chronic kidney disease: Secondary | ICD-10-CM | POA: Diagnosis not present

## 2015-02-19 NOTE — Progress Notes (Signed)
Patient ID: [name], female   DOB: 1923-07-04, 81 y.o.   MRN: [id number]    Nursing Home Location:  [place] of Service: SNF (31)  PCP: Blanchie Serve, MD  Allergies  Allergen Reactions  . Naproxen     REACTION: gastritis  . Nsaids     Chief Complaint  Patient presents with  . Medical Management of Chronic Issues    Medical Management of Chronic Issues    HPI:  Patient is a 81 y.o. female seen today at [facility] for routine follow up on chronic condition. Pt with a pmh of follow CAD, CHF- diastolic, HTN, COPD, HLD. Last month pt was seen by Dr Bubba Camp and blood pressure elevated therefore clonidine increased to 0.2, sbp ranging 130-150s. Pt notes some dizziness on occasion with getting out of bed. Blood pressure slightly low at this time but pt denies any dizziness or being light headed. Pt reports decreased appetite and has had weight loss in the last month. Reports moving bowels without difficulty.   Review of Systems:  Review of Systems  Constitutional: Negative for activity change, appetite change, fatigue and unexpected weight change.  HENT: Negative for congestion and hearing loss.   Eyes: Negative.   Respiratory: Negative for cough and shortness of breath.   Cardiovascular: Negative for chest pain, palpitations and leg swelling.  Gastrointestinal: Negative for abdominal pain, diarrhea and constipation.  Genitourinary: Negative for dysuria and difficulty urinating.  Musculoskeletal: Negative for myalgias and arthralgias.  Skin: Negative for color change and wound.  Neurological: Negative for dizziness and weakness.  Psychiatric/Behavioral: Positive for sleep disturbance (notes sometimes having trouble with sleep). Negative for behavioral problems, confusion and agitation.    Past Medical History  Diagnosis Date  . Coronary artery disease     a. Acute MI 2003, stent to LCx. b. NSTEMI 2008 (cath w/o PCI).  c.  +trop 02/2011 felt 2/2 demand ischemia.  [name] History of atrial flutter 2009    a. s/p ablation of typical flutter 09/2007.  [name] Paroxysmal atrial fibrillation     a. Dx 2009. b. Discontinuation of Coumadin 01/2008 2/2 hemarthrosis/chronic debilitation.  [name] COPD (chronic obstructive pulmonary disease)   . Hyperlipidemia   . Diabetes mellitus   . Diastolic congestive heart failure   . Arthritis   . Chronic kidney disease   . Carotid artery disease     a. Bilateral  . SSS (sick sinus syndrome)     a. Symptomatic bradycardia/syncope and post-termination pauses - St. Jude dual chamber PPM 09/2007.   [name] Pneumonia   . Anxiety     adjustment disorder  . Pacemaker   . Mesenteric ischemia     a. Possible dx, 2013.  . Diverticula of colon     a. Colonoscopy 01/2012.  [name] PVD (peripheral vascular disease)     Carotid dz as noted, repoted aortoiliac occlusive dz, also reported high-grade stenosis at the origin & prox innominate artery, high-grade stenosis of the right carotid bifurcation, moderate stenosis of the proximal prevertebral left subclavian artery by CT 02/2011  . Pituitary tumor     s/p gamma knife  . Tachy-brady syndrome   . ?? Subclavian artery stenosis, right 04/04/2012  . SMA stenosis-moderate 04/04/2012  . Diverticulosis of colon (without mention of hemorrhage) 01/27/2012  . 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 CAROTID BRUIT, RIGHT  04/26/2007    Qualifier: Diagnosis of  By: Maxie Better FNP, Rosalita Levan   . Cardiac pacemaker in situ 09/15/2009    Qualifier: Diagnosis of  By: Lovena Le, MD, Martyn Malay   . GERD (gastroesophageal reflux disease) 08/09/2013  . Pressure ulcer, heel 06/28/2013   Past Surgical History  Procedure Laterality Date  . Cardiac catheterization      2003 cardiac stent  . Insert / replace / remove pacemaker  2009    St. Jude  . Cholecystectomy    . Total knee arthroplasty        bilateral  . Bilateral hip arthroscopy    . 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 Myocardial perfusion study      EF 59% 4/04  . Adenosine myoview study      abn EF 53% 12/04/04  . Carotid doppler  2/99  . Echo with lvh  1/98    old records  . Stress cardiolite  3/04    EF 53%  . Gamma knife radiosurg  08/25/06    [facility]  . Pvd      with stent in the right iliac   . Colonoscopy  01/27/2012    Procedure: COLONOSCOPY;  Surgeon: Inda Castle, MD;  Location: [place];  Service: Endoscopy;  Laterality: N/A;  . Abdominal aortagram N/A 01/25/2012    Procedure: ABDOMINAL AORTAGRAM;  Surgeon: Serafina Mitchell, MD;  Location: [facility] CATH LAB;  Service: Cardiovascular;  Laterality: N/A;   Social History:   reports that she has been smoking Cigarettes.  She has a 10 pack-year smoking history. Her smokeless tobacco use includes Snuff. She reports that she does not drink alcohol or use illicit drugs.  Family History  Problem Relation Age of Onset  . Cancer Neg Hx   . Depression      ?  . Diabetes      DM and HBP- family hx    Medications: Patient's Medications  New Prescriptions   No medications on file  Previous Medications   AMINO ACIDS-PROTEIN HYDROLYS (FEEDING SUPPLEMENT, PRO-STAT SUGAR FREE 64,) LIQD    Take 30 mLs by mouth 2 (two) times daily. For wound healing   AMIODARONE (PACERONE) 200 MG TABLET    Take 1 tablet (200 mg total) by mouth 2 (two) times daily.   AMLODIPINE (NORVASC) 10 MG TABLET    Take 10 mg by mouth daily.    ASPIRIN 81 MG TABLET    Take 81 mg by mouth daily.   CARVEDILOL (COREG) 3.125 MG TABLET    Take 3.125 mg by mouth 2 (two) times daily with a meal.   CITALOPRAM (CELEXA) 10 MG TABLET    Take 1 tablet (10 mg total) by mouth daily.   CLONIDINE (CATAPRES - DOSED IN MG/24 HR) 0.2 MG/24HR PATCH    Place 0.2 mg onto the skin once a week.   CLOPIDOGREL (PLAVIX) 75 MG TABLET    Take 75 mg by mouth daily with breakfast.    DIGOXIN (LANOXIN) 0.125 MG TABLET    Take 1 tablet (0.125 mg total) by mouth every other day.   DOCUSATE SODIUM (COLACE) 100 MG CAPSULE    Take 100 mg by mouth 2 (two) times daily.    FERROUS SULFATE 325 (65 FE) MG TABLET    Take 325 mg by mouth daily with breakfast.    HYDROCODONE-ACETAMINOPHEN (NORCO/VICODIN) 5-325 MG PER TABLET    Take one tablet by mouth every  six hours as needed for pain. Do not exceed 4gm in 24 hours   LEVOTHYROXINE (SYNTHROID, LEVOTHROID) 25 MCG TABLET    Take 25 mcg by mouth daily before breakfast.    POLYETHYLENE GLYCOL (MIRALAX / GLYCOLAX) PACKET    Take 17 g by mouth 2 (two) times daily.   PROPYLENE GLYCOL (SYSTANE BALANCE) 0.6 % SOLN    Apply 1 drop to eye 3 (three) times daily. Both eyes for dry eyes   RANITIDINE (ZANTAC) 150 MG CAPSULE    Take 150 mg by mouth every morning.   SENNA (SENOKOT) 8.6 MG TABS TABLET    Take 1 tablet by mouth at bedtime.   TIOTROPIUM BROMIDE MONOHYDRATE (SPIRIVA RESPIMAT) 2.5 MCG/ACT AERS    Inhale 2 Inhalers into the lungs daily. Use with aerochamber   TORSEMIDE (DEMADEX) 20 MG TABLET    Take 20 mg by mouth daily.   TRAZODONE (DESYREL) 50 MG TABLET    Take 25 mg by mouth at bedtime.    VITAMIN B-12 (CYANOCOBALAMIN) 1000 MCG TABLET    Take 1,000 mcg by mouth daily.   ZOLPIDEM (AMBIEN CR) 6.25 MG CR TABLET    Take 6.25 mg by mouth at bedtime.  Modified Medications   No medications on file  Discontinued Medications   ALBUTEROL (PROVENTIL HFA;VENTOLIN HFA) 108 (90 BASE) MCG/ACT INHALER    Inhale 2 puffs into the lungs every 6 (six) hours as needed for wheezing or shortness of breath.   CLONIDINE (CATAPRES - DOSED IN MG/24 HR) 0.1 MG/24HR PATCH    Place 0.1 mg onto the skin once a week.     Physical Exam: Filed Vitals:   02/19/15 1608  BP: 91/63  Pulse: 60  Temp: 97.5 F (36.4 C)  TempSrc: Oral  Resp: 20  Height: 5\' 2"  (1.575 m)  Weight: 151 lb (68.493 kg)  SpO2: 98%    Physical Exam  Constitutional: She is oriented to  person, place, and time. No distress.  Frail elderly female  HENT:  Head: Normocephalic and atraumatic.  Mouth/Throat: Oropharynx is clear and moist. No oropharyngeal exudate.  Eyes: Conjunctivae are normal. Pupils are equal, round, and reactive to light.  Neck: Normal range of motion. Neck supple.  Cardiovascular: Normal rate, regular rhythm and normal heart sounds.   Pulmonary/Chest: Effort normal and breath sounds normal.  Abdominal: Soft. Bowel sounds are normal.  Musculoskeletal: She exhibits no tenderness. Edema: +1 bilaterally.  Self propels in [facility]  Neurological: She is alert and oriented to person, place, and time.  Skin: Skin is warm and dry. She is not diaphoretic.  Psychiatric: She has a normal mood and affect.    Labs reviewed: Basic Metabolic Panel:  Recent Labs  05/22/14 09/02/14 1422 10/02/14 1511 11/04/14 02/13/15  NA 137  --   --  140 139  K 3.9  --   --  4.8 4.3  BUN 55*  --   --  33* 42*  CREATININE 2.4* 2.65* 2.08* 2.2* 2.0*  CALCIUM  --  8.6*  --   --   --    Liver Function Tests: No results for input(s): AST, ALT, ALKPHOS, BILITOT, PROT, ALBUMIN in the last 8760 hours. No results for input(s): LIPASE, AMYLASE in the last 8760 hours. No results for input(s): AMMONIA in the last 8760 hours. CBC:  Recent Labs  09/02/14 1422 09/11/14 1350 10/02/14 1511 02/13/15  WBC 5.9 6.3 6.5 6.2  NEUTROABS 4.2 4.6  --   --   HGB 13.0 12.5 12.7  11.0*  HCT 39.2 37.5 38.4 34*  MCV 99 99 99.1  --   PLT 339 304 318 250   TSH:  Recent Labs  04/22/14 09/24/14  TSH 4.22 4.34   A1C: Lab Results  Component Value Date   HGBA1C 6.2* 07/19/2014   Lipid Panel:  Recent Labs  09/24/14  CHOL 164  HDL 53  LDLCALC 88  TRIG 114    Assessment/Plan 1. Persistent atrial fibrillation (HCC) Rate controlled. Continue current regimen of coreg, amiodarone and digoxin, digoxin level therapeutic. Continue baby aspirin.   2. Slow transit constipation Stable, conts on  senokot daily   3. Hypothyroidism, unspecified hypothyroidism type Stable, conts on synthroid 25 mcg daily   4. Anemia of chronic disease -noted decrease in hgb on recent labs. Pt with CKD and on iron supplements. No signs of bleeding or blood loss. Will monitor.   5. Weight loss -8 lb weigh loss noted on review of pts monthly weights. Pt reports decreased appetite. Will have RD consult to add supplement. May consider adjusting medication and adding Remeron if supplements not effective due to decreased appetite and insomnia.   6. Insomnia -ongoing, conts on ambien CR qhs and trazodone   7. Depression Stable, conts on celexa 10 mg daily   8. Hypertensive heart and renal disease Blood pressure with improved controlled.  Currently on amlodipine 10 mg daily, carvedilol 3.125mg  twice daily, torsemide 20mg  daily and catapres 0.2mg /24hr patch once a week. Pt notes dizziness when standing at times. Will have staff take orthostatic VS daily for 3 days and report to provider.    Carlos American. Harle Battiest  [facility] [phone] 8 am - 5 pm) [phone] (after hours)

## 2015-03-26 ENCOUNTER — Encounter: Payer: Self-pay | Admitting: Nurse Practitioner

## 2015-03-26 ENCOUNTER — Non-Acute Institutional Stay (SKILLED_NURSING_FACILITY): Payer: Medicare Other | Admitting: Nurse Practitioner

## 2015-03-26 DIAGNOSIS — E039 Hypothyroidism, unspecified: Secondary | ICD-10-CM | POA: Diagnosis not present

## 2015-03-26 DIAGNOSIS — I481 Persistent atrial fibrillation: Secondary | ICD-10-CM | POA: Diagnosis not present

## 2015-03-26 DIAGNOSIS — G4700 Insomnia, unspecified: Secondary | ICD-10-CM

## 2015-03-26 DIAGNOSIS — I10 Essential (primary) hypertension: Secondary | ICD-10-CM

## 2015-03-26 DIAGNOSIS — E559 Vitamin D deficiency, unspecified: Secondary | ICD-10-CM

## 2015-03-26 DIAGNOSIS — K5901 Slow transit constipation: Secondary | ICD-10-CM

## 2015-03-26 DIAGNOSIS — F32A Depression, unspecified: Secondary | ICD-10-CM

## 2015-03-26 DIAGNOSIS — I2510 Atherosclerotic heart disease of native coronary artery without angina pectoris: Secondary | ICD-10-CM | POA: Diagnosis not present

## 2015-03-26 DIAGNOSIS — F329 Major depressive disorder, single episode, unspecified: Secondary | ICD-10-CM

## 2015-03-26 DIAGNOSIS — I4819 Other persistent atrial fibrillation: Secondary | ICD-10-CM

## 2015-03-26 NOTE — Progress Notes (Signed)
Nursing Home Location:  [place] of Service: SNF (31)  PCP: Blanchie Serve, MD  Allergies  Allergen Reactions  . Naproxen     REACTION: gastritis  . Nsaids     Chief Complaint  Patient presents with  . Medical Management of Chronic Issues    HPI:  Patient is a 81 y.o. female seen today at [facility] for routine follow up on chronic condition. Pt with a pmh of follow CAD, CHF- diastolic, HTN, COPD, HLD.  Staff noted pt with low mood, withdrawn and not wanting to get up. Pt has had decreased appetite and has had weight loss in the last month. Pt very short with answers today and talks minimally. Staff reports she did not get out of bed yesterday but will get her up today. Pt reports she feels well and has no complaints.  No trouble swallowing or pain in her mouth. Ongoing use of snuff.   Review of Systems:  Review of Systems  Constitutional: Negative for activity change, appetite change, fatigue and unexpected weight change.  HENT: Negative for congestion and hearing loss.   Eyes: Negative.   Respiratory: Negative for cough and shortness of breath.   Cardiovascular: Negative for chest pain, palpitations and leg swelling.  Gastrointestinal: Negative for abdominal pain, diarrhea and constipation.  Genitourinary: Negative for dysuria and difficulty urinating.  Musculoskeletal: Negative for myalgias and arthralgias.  Skin: Negative for color change and wound.  Neurological: Negative for dizziness and weakness.  Psychiatric/Behavioral: Positive for sleep disturbance and decreased concentration. Negative for behavioral problems, confusion and agitation.       Depressed mood    Past Medical History  Diagnosis Date  . Coronary artery disease     a. Acute MI 2003, stent to LCx. b. NSTEMI 2008 (cath w/o PCI).  c. +trop 02/2011 felt 2/2 demand ischemia.  [name] History of atrial flutter 2009    a. s/p ablation of typical flutter 09/2007.    [name] Paroxysmal atrial fibrillation ([place])     a. Dx 2009. b. Discontinuation of Coumadin 01/2008 2/2 hemarthrosis/chronic debilitation.  [name] COPD (chronic obstructive pulmonary disease) ([place])   . Hyperlipidemia   . Diabetes mellitus   . Diastolic congestive heart failure ([place])   . Arthritis   . Chronic kidney disease   . Carotid artery disease (HCC)     a. Bilateral  . SSS (sick sinus syndrome) ([place])     a. Symptomatic bradycardia/syncope and post-termination pauses - St. Jude dual chamber PPM 09/2007.   [name] Pneumonia   . Anxiety     adjustment disorder  . Pacemaker   . Mesenteric ischemia ([place])     a. Possible dx, 2013.  . Diverticula of colon     a. Colonoscopy 01/2012.  [name] PVD (peripheral vascular disease) ([place])     Carotid dz as noted, repoted aortoiliac occlusive dz, also reported high-grade stenosis at the origin & prox innominate artery, high-grade stenosis of the right carotid bifurcation, moderate stenosis of the proximal prevertebral left subclavian artery by CT 02/2011  . Pituitary tumor ([place])     s/p gamma knife  . Tachy-brady syndrome ([place])   . ?? Subclavian artery stenosis, right 04/04/2012  . SMA stenosis-moderate 04/04/2012  . Diverticulosis of colon (without mention of hemorrhage) 01/27/2012  . 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 CAROTID BRUIT, RIGHT  04/26/2007    Qualifier: Diagnosis of  By: Maxie Better FNP, Rosalita Levan   . Cardiac pacemaker in situ 09/15/2009    Qualifier: Diagnosis of  By: Lovena Le, MD, Martyn Malay   . GERD (gastroesophageal reflux disease) 08/09/2013  . Pressure ulcer, heel 06/28/2013   Past Surgical History  Procedure Laterality Date  . Cardiac catheterization      2003 cardiac stent  . Insert / replace / remove pacemaker  2009    St. Jude  . Cholecystectomy    . Total knee arthroplasty      bilateral  . Bilateral hip arthroscopy    . Tumor removed       pituitary-[facility]-benign 12/02. 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 Myocardial perfusion study      EF 59% 4/04  . Adenosine myoview study      abn EF 53% 12/04/04  . Carotid doppler  2/99  . Echo with lvh  1/98    old records  . Stress cardiolite  3/04    EF 53%  . Gamma knife radiosurg  08/25/06    [facility]  . Pvd      with stent in the right iliac   . Colonoscopy  01/27/2012    Procedure: COLONOSCOPY;  Surgeon: Inda Castle, MD;  Location: [place];  Service: Endoscopy;  Laterality: N/A;  . Abdominal aortagram N/A 01/25/2012    Procedure: ABDOMINAL AORTAGRAM;  Surgeon: Serafina Mitchell, MD;  Location: [facility] CATH LAB;  Service: Cardiovascular;  Laterality: N/A;   Social History:   reports that she has been smoking Cigarettes.  She has a 10 pack-year smoking history. Her smokeless tobacco use includes Snuff. She reports that she does not drink alcohol or use illicit drugs.  Family History  Problem Relation Age of Onset  . Cancer Neg Hx   . Depression      ?  . Diabetes      DM and HBP- family hx    Medications: Patient's Medications  New Prescriptions   No medications on file  Previous Medications   AMINO ACIDS-PROTEIN HYDROLYS (FEEDING SUPPLEMENT, PRO-STAT SUGAR FREE 64,) LIQD    Take 30 mLs by mouth 2 (two) times daily. For wound healing   AMIODARONE (PACERONE) 200 MG TABLET    Take 1 tablet (200 mg total) by mouth 2 (two) times daily.   AMLODIPINE (NORVASC) 10 MG TABLET    Take 10 mg by mouth daily.    ASPIRIN 81 MG TABLET    Take 81 mg by mouth daily.   CARVEDILOL (COREG) 3.125 MG TABLET    Take 3.125 mg by mouth 2 (two) times daily with a meal.   CHOLECALCIFEROL (VITAMIN D3) 50000 UNITS CAPS    Take 1 capsule by mouth once a week.   CITALOPRAM (CELEXA) 10 MG TABLET    Take 1 tablet (10 mg total) by mouth daily.   CLONIDINE (CATAPRES - DOSED IN MG/24 HR) 0.2 MG/24HR PATCH    Place 0.2 mg onto the skin once a week. On Tuesday   CLOPIDOGREL (PLAVIX) 75 MG TABLET    Take 75 mg  by mouth daily with breakfast.   DIGOXIN (LANOXIN) 0.125 MG TABLET    Take 1 tablet (0.125 mg total) by mouth every other day.   DOCUSATE SODIUM (COLACE) 100 MG CAPSULE    Take 100 mg by mouth 2 (two) times daily.    FERROUS SULFATE 325 (65 FE) MG TABLET    Take 325 mg by mouth daily  with breakfast.    HYDROCODONE-ACETAMINOPHEN (NORCO/VICODIN) 5-325 MG PER TABLET    Take one tablet by mouth every six hours as needed for pain. Do not exceed 4gm in 24 hours   LEVOTHYROXINE (SYNTHROID, LEVOTHROID) 25 MCG TABLET    Take 25 mcg by mouth daily before breakfast.    POLYETHYLENE GLYCOL (MIRALAX / GLYCOLAX) PACKET    Take 17 g by mouth 2 (two) times daily.   PROPYLENE GLYCOL (SYSTANE BALANCE) 0.6 % SOLN    Apply 1 drop to eye 3 (three) times daily. Both eyes for dry eyes   RANITIDINE (ZANTAC) 150 MG CAPSULE    Take 150 mg by mouth every morning.   SENNA (SENOKOT) 8.6 MG TABS TABLET    Take 1 tablet by mouth at bedtime.   TIOTROPIUM BROMIDE MONOHYDRATE (SPIRIVA RESPIMAT) 2.5 MCG/ACT AERS    Inhale 2 Inhalers into the lungs daily. Use with aerochamber   TORSEMIDE (DEMADEX) 20 MG TABLET    Take 20 mg by mouth daily.   TRAZODONE (DESYREL) 50 MG TABLET    Take 25 mg by mouth at bedtime.    VITAMIN B-12 (CYANOCOBALAMIN) 1000 MCG TABLET    Take 1,000 mcg by mouth daily.   ZOLPIDEM (AMBIEN CR) 6.25 MG CR TABLET    Take 6.25 mg by mouth at bedtime.  Modified Medications   No medications on file  Discontinued Medications   No medications on file     Physical Exam: Filed Vitals:   03/26/15 1013  BP: 132/46  Pulse: 65  Temp: 97.8 F (36.6 C)  Resp: 16  SpO2: 96%    Physical Exam  Constitutional: She is oriented to person, place, and time. No distress.  Frail elderly female  HENT:  Head: Normocephalic and atraumatic.  Mouth/Throat: Oropharynx is clear and moist. No oropharyngeal exudate.  Eyes: Conjunctivae are normal. Pupils are equal, round, and reactive to light.  Neck: Normal range of motion.  Neck supple.  Cardiovascular: Normal rate, regular rhythm and normal heart sounds.   Pulmonary/Chest: Effort normal and breath sounds normal.  Abdominal: Soft. Bowel sounds are normal.  Musculoskeletal: She exhibits no edema or tenderness.  Self propels in [facility]  Neurological: She is alert and oriented to person, place, and time.  Skin: Skin is warm and dry. She is not diaphoretic.  Psychiatric: She has a normal mood and affect.    Labs reviewed: Basic Metabolic Panel:  Recent Labs  05/22/14 09/02/14 1422 10/02/14 1511 11/04/14 02/13/15  NA 137  --   --  140 139  K 3.9  --   --  4.8 4.3  BUN 55*  --   --  33* 42*  CREATININE 2.4* 2.65* 2.08* 2.2* 2.0*  CALCIUM  --  8.6*  --   --   --    Liver Function Tests: No results for input(s): AST, ALT, ALKPHOS, BILITOT, PROT, ALBUMIN in the last 8760 hours. No results for input(s): LIPASE, AMYLASE in the last 8760 hours. No results for input(s): AMMONIA in the last 8760 hours. CBC:  Recent Labs  09/02/14 1422 09/11/14 1350 10/02/14 1511 02/13/15  WBC 5.9 6.3 6.5 6.2  NEUTROABS 4.2 4.6  --   --   HGB 13.0 12.5 12.7 11.0*  HCT 39.2 37.5 38.4 34*  MCV 99 99 99.1  --   PLT 339 304 318 250   TSH:  Recent Labs  04/22/14 09/24/14  TSH 4.22 4.34   A1C: Lab Results  Component Value Date   HGBA1C  6.2* 07/19/2014   Lipid Panel:  Recent Labs  09/24/14  CHOL 164  HDL 53  LDLCALC 88  TRIG 114      Assessment/Plan 1. HYPERTENSION, BENIGN ESSENTIAL Blood pressure stable on coreg, catapres, and norvasc   2. Atherosclerosis of native coronary artery of native heart without angina pectoris No chest pains at this time. conts on ASA 81 mg daily   3. Slow transit constipation -stable at this time, cont current regimen   4. Insomnia Stable at this time, on trazodone and Ambien, will dc trazodone since starting Remeron   5. Depression Worsening mood per staff. Pt very withdrawn and decreased in appetite. Currently on celexa  10 mg daily, will start Remeron 15 mg q hs   6. Hypothyroidism, unspecified hypothyroidism type TSH 5.3 in September due to weight loss will follow up at this time.   7. Persistent atrial fibrillation (HCC) Rate controlled, conts on amiodarone, digoxin, coreg.   8. Vitamin D deficiency Vit D level of 11.8 in September, conts on vit D 50,000 units weekly  9. Weight loss -another 10 lb weight loss noted in the last month. Pt being followed by RD, will add med pass BID and remeron 15 mg qhs   Jessica K. Harle Battiest  [facility] [phone] 8 am - 5 pm) [phone] (after hours)

## 2015-05-09 ENCOUNTER — Non-Acute Institutional Stay (SKILLED_NURSING_FACILITY): Payer: Medicare Other | Admitting: Adult Health

## 2015-05-09 DIAGNOSIS — G4700 Insomnia, unspecified: Secondary | ICD-10-CM | POA: Diagnosis not present

## 2015-05-09 DIAGNOSIS — E213 Hyperparathyroidism, unspecified: Secondary | ICD-10-CM

## 2015-05-09 DIAGNOSIS — J449 Chronic obstructive pulmonary disease, unspecified: Secondary | ICD-10-CM | POA: Diagnosis not present

## 2015-05-09 DIAGNOSIS — I4819 Other persistent atrial fibrillation: Secondary | ICD-10-CM

## 2015-05-09 DIAGNOSIS — E785 Hyperlipidemia, unspecified: Secondary | ICD-10-CM | POA: Diagnosis not present

## 2015-05-09 DIAGNOSIS — K5901 Slow transit constipation: Secondary | ICD-10-CM

## 2015-05-09 DIAGNOSIS — I481 Persistent atrial fibrillation: Secondary | ICD-10-CM | POA: Diagnosis not present

## 2015-05-09 DIAGNOSIS — F329 Major depressive disorder, single episode, unspecified: Secondary | ICD-10-CM

## 2015-05-09 DIAGNOSIS — N189 Chronic kidney disease, unspecified: Secondary | ICD-10-CM | POA: Diagnosis not present

## 2015-05-09 DIAGNOSIS — K219 Gastro-esophageal reflux disease without esophagitis: Secondary | ICD-10-CM

## 2015-05-09 DIAGNOSIS — IMO0001 Reserved for inherently not codable concepts without codable children: Secondary | ICD-10-CM

## 2015-05-09 DIAGNOSIS — I130 Hypertensive heart and chronic kidney disease with heart failure and stage 1 through stage 4 chronic kidney disease, or unspecified chronic kidney disease: Secondary | ICD-10-CM | POA: Diagnosis not present

## 2015-05-09 DIAGNOSIS — I509 Heart failure, unspecified: Secondary | ICD-10-CM

## 2015-05-09 DIAGNOSIS — F32A Depression, unspecified: Secondary | ICD-10-CM

## 2015-05-11 ENCOUNTER — Encounter: Payer: Self-pay | Admitting: Adult Health

## 2015-05-11 DIAGNOSIS — E213 Hyperparathyroidism, unspecified: Secondary | ICD-10-CM | POA: Insufficient documentation

## 2015-05-11 NOTE — Progress Notes (Signed)
Patient ID: [name], female   DOB: 14-Feb-1924, 81 y.o.   MRN: [id number]    Facility: [facility]       Allergies  Allergen Reactions  . Naproxen     REACTION: gastritis  . Nsaids     Chief Complaint  Patient presents with  . Medical Management of Chronic Issues    HPI:  She is a long term resident of this facility being seen for the management of her chronic illnesses. Overall there is little change in her status. She tells me that she is feeling good and does not have any concerns. There are no nursing concerns at this time.    Past Medical History  Diagnosis Date  . Coronary artery disease     a. Acute MI 2003, stent to LCx. b. NSTEMI 2008 (cath w/o PCI).  c. +trop 02/2011 felt 2/2 demand ischemia.  [name] History of atrial flutter 2009    a. s/p ablation of typical flutter 09/2007.  [name] Paroxysmal atrial fibrillation ([place])     a. Dx 2009. b. Discontinuation of Coumadin 01/2008 2/2 hemarthrosis/chronic debilitation.  [name] COPD (chronic obstructive pulmonary disease) ([place])   . Hyperlipidemia   . Diabetes mellitus   . Diastolic congestive heart failure ([place])   . Arthritis   . Chronic kidney disease   . Carotid artery disease (HCC)     a. Bilateral  . SSS (sick sinus syndrome) ([place])     a. Symptomatic bradycardia/syncope and post-termination pauses - St. Jude dual chamber PPM 09/2007.   [name] Pneumonia   . Anxiety     adjustment disorder  . Pacemaker   . Mesenteric ischemia ([place])     a. Possible dx, 2013.  . Diverticula of colon     a. Colonoscopy 01/2012.  [name] PVD (peripheral vascular disease) ([place])     Carotid dz as noted, repoted aortoiliac occlusive dz, also reported high-grade stenosis at the origin & prox innominate artery, high-grade stenosis of the right carotid bifurcation, moderate stenosis of the proximal prevertebral left subclavian artery by CT 02/2011  . Pituitary tumor ([place])     s/p gamma knife  . Tachy-brady syndrome ([place])   . ?? Subclavian artery  stenosis, right 04/04/2012  . SMA stenosis-moderate 04/04/2012  . Diverticulosis of colon (without mention of hemorrhage) 01/27/2012  . 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 CAROTID BRUIT, RIGHT 04/26/2007    Qualifier: Diagnosis of  By: Maxie Better FNP, Rosalita Levan   . Cardiac pacemaker in situ 09/15/2009    Qualifier: Diagnosis of  By: Lovena Le, MD, Martyn Malay   . GERD (gastroesophageal reflux disease) 08/09/2013  . Pressure ulcer, heel 06/28/2013    Past Surgical History  Procedure Laterality Date  . Cardiac catheterization      2003 cardiac stent  . Insert / replace / remove pacemaker  2009    St. Jude  . Cholecystectomy    . Total knee arthroplasty      bilateral  . Bilateral hip arthroscopy    . 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 Myocardial perfusion study      EF 59% 4/04  . Adenosine myoview study      abn EF 53% 12/04/04  . Carotid doppler  2/99  . Echo with lvh  1/98    old records  . Stress cardiolite  3/04  EF 53%  . Gamma knife radiosurg  08/25/06    [facility]  . Pvd      with stent in the right iliac   . Colonoscopy  01/27/2012    Procedure: COLONOSCOPY;  Surgeon: Inda Castle, MD;  Location: [place];  Service: Endoscopy;  Laterality: N/A;  . Abdominal aortagram N/A 01/25/2012    Procedure: ABDOMINAL AORTAGRAM;  Surgeon: Serafina Mitchell, MD;  Location: [facility] CATH LAB;  Service: Cardiovascular;  Laterality: N/A;    VITAL SIGNS BP 129/59 mmHg  Pulse 65  Resp 18  SpO2 97%  Patient's Medications  New Prescriptions   No medications on file  Previous Medications   AMINO ACIDS-PROTEIN HYDROLYS (FEEDING SUPPLEMENT, PRO-STAT SUGAR FREE 64,) LIQD    Take 30 mLs by mouth 2 (two) times daily. For wound healing   AMIODARONE (PACERONE) 200 MG TABLET    Take 1 tablet (200 mg total) by mouth 2 (two) times daily.   AMLODIPINE  (NORVASC) 10 MG TABLET    Take 10 mg by mouth daily.    ASPIRIN 81 MG TABLET    Take 81 mg by mouth daily.   CALCITRIOL (ROCALTROL) 0.25 MCG CAPSULE    Take 0.25 mcg by mouth 3 (three) times a week.   CARVEDILOL (COREG) 3.125 MG TABLET    Take 3.125 mg by mouth 2 (two) times daily with a meal.   CHOLECALCIFEROL (VITAMIN D3) 50000 UNITS CAPS    Take 1 capsule by mouth once a week.   CITALOPRAM (CELEXA) 10 MG TABLET    Take 1 tablet (10 mg total) by mouth daily.   CLONIDINE (CATAPRES - DOSED IN MG/24 HR) 0.2 MG/24HR PATCH    Place 0.2 mg onto the skin once a week. On Tuesday   CLOPIDOGREL (PLAVIX) 75 MG TABLET    Take 75 mg by mouth daily with breakfast.   DIGOXIN (LANOXIN) 0.125 MG TABLET    Take 1 tablet (0.125 mg total) by mouth every other day.   DOCUSATE SODIUM (COLACE) 100 MG CAPSULE    Take 100 mg by mouth 2 (two) times daily.    FERROUS SULFATE 325 (65 FE) MG TABLET    Take 325 mg by mouth daily with breakfast.    HYDROCODONE-ACETAMINOPHEN (NORCO/VICODIN) 5-325 MG PER TABLET    Take one tablet by mouth every six hours as needed for pain. Do not exceed 4gm in 24 hours   LEVOTHYROXINE (SYNTHROID, LEVOTHROID) 25 MCG TABLET    Take 25 mcg by mouth daily before breakfast.    MIRTAZAPINE (REMERON) 15 MG TABLET    Take 15 mg by mouth at bedtime.   POLYETHYLENE GLYCOL (MIRALAX / GLYCOLAX) PACKET    Take 17 g by mouth 2 (two) times daily.   PROPYLENE GLYCOL (SYSTANE BALANCE) 0.6 % SOLN    Apply 1 drop to eye 3 (three) times daily. Both eyes for dry eyes   RANITIDINE (ZANTAC) 150 MG CAPSULE    Take 150 mg by mouth every morning.   SENNA (SENOKOT) 8.6 MG TABS TABLET    Take 1 tablet by mouth at bedtime.   TIOTROPIUM BROMIDE MONOHYDRATE (SPIRIVA RESPIMAT) 2.5 MCG/ACT AERS    Inhale 2 Inhalers into the lungs daily. Use with aerochamber   TORSEMIDE (DEMADEX) 20 MG TABLET    Take 20 mg by mouth daily.   VITAMIN B-12 (CYANOCOBALAMIN) 1000 MCG TABLET    Take 1,000 mcg by mouth daily.   ZOLPIDEM (AMBIEN CR)  6.25 MG CR TABLET  Take 6.25 mg by mouth at bedtime.  Modified Medications   No medications on file  Discontinued Medications     SIGNIFICANT DIAGNOSTIC EXAMS   LABS REVIEWED:   02-13-15: wbc 6.2; hgb 11.0; hct 34.4; mcv 102.1; plt 250; glucose 100; bun 42; creat 2.05; k+ 4.3; na++139; albumin 2.97; phos 3.2; mag 1.9; PTH 125.99; vit d 11.89   Review of Systems  Constitutional: Negative for malaise/fatigue.  Respiratory: Negative for cough and shortness of breath.   Cardiovascular: Negative for chest pain.  Gastrointestinal: Negative for heartburn, abdominal pain and constipation.  Musculoskeletal: Negative for myalgias and joint pain.  Skin: Negative.   Psychiatric/Behavioral: The patient is not nervous/anxious and does not have insomnia.     Physical Exam  Constitutional: No distress.  Eyes: Conjunctivae are normal.  Neck: Neck supple. No JVD present. No thyromegaly present.  Cardiovascular: Normal rate, regular rhythm and intact distal pulses.   Respiratory: Effort normal and breath sounds normal. No respiratory distress. She has no wheezes.  GI: Soft. Bowel sounds are normal. She exhibits no distension. There is no tenderness.  Musculoskeletal: She exhibits no edema.  Able to move all extremities   Lymphadenopathy:    She has no cervical adenopathy.  Neurological: She is alert.  Skin: Skin is warm and dry. She is not diaphoretic.  Psychiatric: She has a normal mood and affect.      ASSESSMENT/ PLAN:  1. Hypertension: will continue clonidine patch 0.2 mg weekly; norvasc 10 mg daily coreg 3.125 mg twice daily   2. Afib: her heart rate is stable; will continue amiodarone 200 mg twice daily and coreg 3.125 mg twice daily for rate control will continue digoxin 125 mcg every other day will continue palvix 75 mg daily   3.  Diastolic heart failure: will continue demadex 20 mg daily   4. Gerd: will continue zantac 150 mg daily   6. CAD is status post stents; no  complaint of chest pain present; will continue plavix 75 mg daily asa 81 mg daily   7. Constipation: will continue senna daily and miralax twice daily colace twice daily  8. Anemia of chronic disease: will continue iron daily; hgb is 11.0  9. COPD: will continue spiriva respimet daily   10. Hypothyroidism: will continue synthroid 25 mcg daily   11. Depression: will continue celexa 10 mg daily   12. Insomnia: will continue ambien cr 6.25 mg nightly   13. Weight loss: will continue remeron 15 mg nightly for appetite stimulant   14. Hyperparathyroidism: will continue calcitriol 0.25 mcg three times weekly   15. Vit d deficiency: will continue vit d 50,000 units weekly    Time spent with patient  45  minutes >50% time spent counseling; reviewing medical record; tests; labs; and developing future plan of care    Ok Edwards NP [facility]  Contact [phone] Monday through Friday 8am- 5pm  After hours call [phone]

## 2015-06-02 ENCOUNTER — Non-Acute Institutional Stay (SKILLED_NURSING_FACILITY): Payer: Medicare Other | Admitting: Nurse Practitioner

## 2015-06-02 DIAGNOSIS — F329 Major depressive disorder, single episode, unspecified: Secondary | ICD-10-CM

## 2015-06-02 DIAGNOSIS — N6310 Unspecified lump in the right breast, unspecified quadrant: Secondary | ICD-10-CM

## 2015-06-02 DIAGNOSIS — IMO0001 Reserved for inherently not codable concepts without codable children: Secondary | ICD-10-CM

## 2015-06-02 DIAGNOSIS — N189 Chronic kidney disease, unspecified: Secondary | ICD-10-CM

## 2015-06-02 DIAGNOSIS — R634 Abnormal weight loss: Secondary | ICD-10-CM

## 2015-06-02 DIAGNOSIS — I130 Hypertensive heart and chronic kidney disease with heart failure and stage 1 through stage 4 chronic kidney disease, or unspecified chronic kidney disease: Secondary | ICD-10-CM

## 2015-06-02 DIAGNOSIS — N63 Unspecified lump in breast: Secondary | ICD-10-CM

## 2015-06-02 DIAGNOSIS — I509 Heart failure, unspecified: Secondary | ICD-10-CM

## 2015-06-02 DIAGNOSIS — F32A Depression, unspecified: Secondary | ICD-10-CM

## 2015-06-02 NOTE — Progress Notes (Signed)
Patient ID: [name], female   DOB: [birth year], 82 y.o.   MRN: [id number]  [facility]:  [facility] [id number], Karalee Height, MD  Code Status: DNR effective date 08/16/2014  Advanced Directives 09/20/2014  Does patient have an advance directive? Yes  Type of Advance Directive Out of facility DNR (pink MOST or yellow form)  Copy of advanced directive(s) in chart? Yes  Pre-existing out of facility DNR order (yellow form or pink MOST form) Yellow form placed in chart (order not valid for inpatient use)     Chief Complaint  Patient presents with  . Acute Visit    Facility staff reports patient has a round, hard lump on the right breast, the size of a golf ball.     HPI:  Pt is a 82 y.o. female seen today for medical management of acute issue.Patient has past medical history of CAD, HTN, COPD, Afib, Type 2 DM, Hyperlipidemia, Diastolic CHF,CKD,anxiety, and GERD, PVD.Seen today per the request of the facility staff who report that patient has a round, hard lump on the right breast golf ball size. Patient is seen in her room sleeping. She denies any pain or tenderness on right breast, no fever or chills.Denies any history of breast cancer in the family. She has progressively lost weight over the past six months. She weighed 156 in 11/25/2014 now down to 133 pounds despite being on protein supplements.   Review of Systems  Constitutional: Positive for weight loss. Negative for fever, chills and malaise/fatigue.  HENT: Negative.   Respiratory: Negative.   Cardiovascular: Negative.   Gastrointestinal: Negative.   Musculoskeletal: Negative.   Skin: Negative.   Neurological: Negative.   Psychiatric/Behavioral: Positive for depression.      Past Medical History  Diagnosis Date  . Coronary artery disease     a. Acute MI 2003, stent to LCx. b. NSTEMI 2008 (cath w/o PCI).  c. +trop 02/2011 felt 2/2 demand ischemia.  [name] History of atrial flutter 2009    a. s/p ablation of typical flutter 09/2007.    [name] Paroxysmal atrial fibrillation ([place])     a. Dx 2009. b. Discontinuation of Coumadin 01/2008 2/2 hemarthrosis/chronic debilitation.  [name] COPD (chronic obstructive pulmonary disease) ([place])   . Hyperlipidemia   . Diabetes mellitus   . Diastolic congestive heart failure ([place])   . Arthritis   . Chronic kidney disease   . Carotid artery disease (HCC)     a. Bilateral  . SSS (sick sinus syndrome) ([place])     a. Symptomatic bradycardia/syncope and post-termination pauses - St. Jude dual chamber PPM 09/2007.   [name] Pneumonia   . Anxiety     adjustment disorder  . Pacemaker   . Mesenteric ischemia ([place])     a. Possible dx, 2013.  . Diverticula of colon     a. Colonoscopy 01/2012.  [name] PVD (peripheral vascular disease) ([place])     Carotid dz as noted, repoted aortoiliac occlusive dz, also reported high-grade stenosis at the origin & prox innominate artery, high-grade stenosis of the right carotid bifurcation, moderate stenosis of the proximal prevertebral left subclavian artery by CT 02/2011  . Pituitary tumor ([place])     s/p gamma knife  . Tachy-brady syndrome ([place])   . ?? Subclavian artery stenosis, right 04/04/2012  . SMA stenosis-moderate 04/04/2012  . Diverticulosis of colon (without mention of hemorrhage) 01/27/2012  . 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  CAROTID BRUIT, RIGHT 04/26/2007    Qualifier: Diagnosis of  By: Maxie Better FNP, Rosalita Levan   . Cardiac pacemaker in situ 09/15/2009    Qualifier: Diagnosis of  By: Lovena Le, MD, Martyn Malay   . GERD (gastroesophageal reflux disease) 08/09/2013  . Pressure ulcer, heel 06/28/2013   Past Surgical History  Procedure Laterality Date  . Cardiac catheterization      2003 cardiac stent  . Insert / replace / remove pacemaker  2009    St. Jude  . Cholecystectomy    . Total knee arthroplasty      bilateral  . Bilateral hip arthroscopy    . Tumor removed       pituitary-[facility]-benign 12/02. 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 Myocardial perfusion study      EF 59% 4/04  . Adenosine myoview study      abn EF 53% 12/04/04  . Carotid doppler  2/99  . Echo with lvh  1/98    old records  . Stress cardiolite  3/04    EF 53%  . Gamma knife radiosurg  08/25/06    [facility]  . Pvd      with stent in the right iliac   . Colonoscopy  01/27/2012    Procedure: COLONOSCOPY;  Surgeon: Inda Castle, MD;  Location: [facility];  Service: Endoscopy;  Laterality: N/A;  . Abdominal aortagram N/A 01/25/2012    Procedure: ABDOMINAL AORTAGRAM;  Surgeon: Serafina Mitchell, MD;  Location: [facility] CATH LAB;  Service: Cardiovascular;  Laterality: N/A;    Allergies  Allergen Reactions  . Naproxen     REACTION: gastritis  . Nsaids       Medication List       This list is accurate as of: 06/02/15  2:24 PM.  Always use your most recent med list.               amiodarone 200 MG tablet  Commonly known as:  PACERONE  Take 1 tablet (200 mg total) by mouth 2 (two) times daily.     amLODipine 10 MG tablet  Commonly known as:  NORVASC  Take 10 mg by mouth daily.     aspirin 81 MG tablet  Take 81 mg by mouth daily.     calcitRIOL 0.25 MCG capsule  Commonly known as:  ROCALTROL  Take 0.25 mcg by mouth 3 (three) times a week.     carvedilol 3.125 MG tablet  Commonly known as:  COREG  Take 3.125 mg by mouth 2 (two) times daily with a meal.     citalopram 10 MG tablet  Commonly known as:  CELEXA  Take 1 tablet (10 mg total) by mouth daily.     cloNIDine 0.2 mg/24hr patch  Commonly known as:  CATAPRES - Dosed in mg/24 hr  Place 0.2 mg onto the skin once a week. On Tuesday     clopidogrel 75 MG tablet  Commonly known as:  PLAVIX  Take 75 mg by mouth daily with breakfast.     digoxin 0.125 MG tablet  Commonly known as:  LANOXIN  Take 1 tablet (0.125 mg total) by mouth every other day.     docusate sodium 100 MG capsule  Commonly known as:  COLACE  Take 100 mg  by mouth 2 (two) times daily.     feeding supplement (PRO-STAT SUGAR FREE 64) Liqd  Take 30 mLs by mouth 2 (two) times daily. For wound healing     ferrous sulfate 325 (  65 FE) MG tablet  Take 325 mg by mouth daily with breakfast.     HYDROcodone-acetaminophen 5-325 MG tablet  Commonly known as:  NORCO/VICODIN  Take one tablet by mouth every six hours as needed for pain. Do not exceed 4gm in 24 hours     levothyroxine 25 MCG tablet  Commonly known as:  SYNTHROID, LEVOTHROID  Take 25 mcg by mouth daily before breakfast.     mirtazapine 15 MG tablet  Commonly known as:  REMERON  Take 15 mg by mouth at bedtime.     polyethylene glycol packet  Commonly known as:  MIRALAX / GLYCOLAX  Take 17 g by mouth 2 (two) times daily.     ranitidine 150 MG capsule  Commonly known as:  ZANTAC  Take 150 mg by mouth every morning.     senna 8.6 MG Tabs tablet  Commonly known as:  SENOKOT  Take 1 tablet by mouth at bedtime.     SYSTANE BALANCE 0.6 % Soln  Generic drug:  Propylene Glycol  Apply 1 drop to eye 3 (three) times daily. Both eyes for dry eyes     Tiotropium Bromide Monohydrate 2.5 MCG/ACT Aers  Commonly known as:  SPIRIVA RESPIMAT  Inhale 2 Inhalers into the lungs daily. Use with aerochamber     torsemide 20 MG tablet  Commonly known as:  DEMADEX  Take 20 mg by mouth daily.     vitamin B-12 1000 MCG tablet  Commonly known as:  CYANOCOBALAMIN  Take 1,000 mcg by mouth daily.     Vitamin D3 50000 units Caps  Take 1 capsule by mouth once a week.     zolpidem 6.25 MG CR tablet  Commonly known as:  AMBIEN CR  Take 6.25 mg by mouth at bedtime.        Immunization History  Administered Date(s) Administered  . Influenza Split 02/07/2012  . Influenza Whole 03/16/2007, 02/20/2008, 03/03/2009  . Influenza-Unspecified 02/21/2014, 02/26/2015  . PPD Test 02/14/2013, 03/03/2013, 01/21/2015  . Pneumococcal Polysaccharide-23 05/19/1995  . Pneumococcal-Unspecified 03/06/2014  . Td  05/18/1992  . Zoster 01/07/2010   Pertinent  Health Maintenance Due  Topic Date Due  . OPHTHALMOLOGY EXAM  02/06/1936  . DEXA SCAN  02/06/1991  . FOOT EXAM  11/15/2014  . HEMOGLOBIN A1C  01/19/2015  . PNA vac Low Risk Adult (2 of 2 - PCV13) 03/07/2015  . INFLUENZA VACCINE  12/16/2015   Fall Risk  08/16/2014  Falls in the past year? Yes  Number falls in past yr: 2 or more  Injury with Fall? No  Risk Factor Category  High Fall Risk  Risk for fall due to : Impaired balance/gait;Impaired mobility  Follow up Falls prevention discussed;Education provided;Falls evaluation completed    Filed Vitals:   06/02/15 1346  BP: 111/55  Pulse: 68  Temp: 97.8 F (36.6 C)  Resp: 20  Weight: 133 lb 4.8 oz (60.464 kg)   Body mass index is 24.37 kg/(m^2). Physical Exam  Constitutional: She is oriented to person, place, and time. She appears well-developed.  Very quiet during the visit response to  yes or no questions.   HENT:  Head: Normocephalic.  Eyes: Pupils are equal, round, and reactive to light.  Neck: Normal range of motion.  Cardiovascular: Normal rate.   Pulmonary/Chest: Effort normal and breath sounds normal. Right breast exhibits mass. Right breast exhibits no inverted nipple, no nipple discharge, no skin change and no tenderness. Left breast exhibits no inverted nipple, no mass, no  nipple discharge, no skin change and no tenderness.  Right upper inner quadrant breast firm, Golf ball size  Mass noted.  Abdominal: Soft. Bowel sounds are normal.  Neurological: She is oriented to person, place, and time.  Skin: Skin is warm and dry.  Psychiatric:  Depressed response yes or no to questions.     Labs reviewed:  Recent Labs  09/02/14 1422 10/02/14 1511 11/04/14 02/13/15  NA  --   --  140 139  K  --   --  4.8 4.3  BUN  --   --  33* 42*  CREATININE 2.65* 2.08* 2.2* 2.0*  CALCIUM 8.6*  --   --   --    No results for input(s): AST, ALT, ALKPHOS, BILITOT, PROT, ALBUMIN in the last  8760 hours.  Recent Labs  09/02/14 1422 09/11/14 1350 10/02/14 1511 02/13/15  WBC 5.9 6.3 6.5 6.2  NEUTROABS 4.2 4.6  --   --   HGB 13.0 12.5 12.7 11.0*  HCT 39.2 37.5 38.4 34*  MCV 99 99 99.1  --   PLT 339 304 318 250   Lab Results  Component Value Date   TSH 4.34 09/24/2014   Lab Results  Component Value Date   HGBA1C 6.2* 07/19/2014   Lab Results  Component Value Date   CHOL 164 09/24/2014   HDL 53 09/24/2014   LDLCALC 88 09/24/2014   TRIG 114 09/24/2014   CHOLHDL 2 12/24/2008    Significant Diagnostic Results in last 30 days:  No results found.  Assessment/Plan 1. Depression Currently on Celexa 10 mg Tablet daily seen by Psychiatry 05/28/2015 no med changes. Seems to be depressed during this visit might benefit from adjusting dosage.Will increased Celexa to 20 mg Tablet daily. Continue to monitor.    2. Hypertensive heart/renal disease with failure (HCC) B/p within target goal range on clonidine, Amlodipine and Torsemide. Will continue current medication and monitor.   3. Mass of breast, right Right upper inner quadrant breast mass firm, non-tender golf size mass.Due to patient's advance age will avoid ordering right breast  Diagnostic mammogram until we discuss with patient's family member. I tried to reach patient's family first contact: [name] at [phone] (day), [phone](cell);  Second contact: [name] at [phone]); Third contact [name] (cell) but no response. DON and Facility staff to attempt to reach family.    4. Weight loss Has had progressive weight loss over six months.Has had decreased appetite per facility staff.This could be associated with her depression/breast mass too. Currently on remeron 15mg  Tablet at bedtime and Pro-Stat 30 mls twice daily. Celexa increased to 20mg  tablet this visit. Will continue to monitor.     Dinah Ngetich FNP/Jessica Harle Battiest

## 2015-06-04 NOTE — Progress Notes (Addendum)
Patient ID: [name], female   DOB: [birth year], 82 y.o.   MRN: [id number] Called and spoke with patient's granddaughter Danise Edge concerning patient's right breast mass. Discussed with her option for mammogram and further treatment and patient's advance age. Patient's granddaughter stated patient refused chemotherapy and further treatment or testing for possible multiple myeloma in the past and does not think she could have agreed to treatment of the right breast mass. Option discussed with patient's granddaughter to monitor and place on hospice for comfort care if needed. Patient's granddaughter in agreement with hospice services if needed due to the decline in condition and ongoing weight loss.    Dinah Ngetich FNP-C/ Sherrie Mustache AGNP

## 2015-06-09 ENCOUNTER — Other Ambulatory Visit: Payer: Self-pay | Admitting: *Deleted

## 2015-06-09 MED ORDER — ZOLPIDEM TARTRATE ER 6.25 MG PO TBCR: EXTENDED_RELEASE_TABLET | ORAL | Status: AC

## 2015-06-09 NOTE — Telephone Encounter (Signed)
[facility] 

## 2015-06-10 ENCOUNTER — Encounter (HOSPITAL_COMMUNITY): Payer: Self-pay

## 2015-06-10 ENCOUNTER — Emergency Department (HOSPITAL_COMMUNITY): Payer: Medicare Other

## 2015-06-10 ENCOUNTER — Inpatient Hospital Stay (HOSPITAL_COMMUNITY)
Admission: EM | Admit: 2015-06-10 | Discharge: 2015-06-18 | DRG: 871 | Disposition: E | Payer: Medicare Other | Attending: Internal Medicine | Admitting: Internal Medicine

## 2015-06-10 DIAGNOSIS — I959 Hypotension, unspecified: Secondary | ICD-10-CM | POA: Diagnosis present

## 2015-06-10 DIAGNOSIS — J449 Chronic obstructive pulmonary disease, unspecified: Secondary | ICD-10-CM | POA: Diagnosis present

## 2015-06-10 DIAGNOSIS — E1151 Type 2 diabetes mellitus with diabetic peripheral angiopathy without gangrene: Secondary | ICD-10-CM | POA: Diagnosis present

## 2015-06-10 DIAGNOSIS — Z79899 Other long term (current) drug therapy: Secondary | ICD-10-CM

## 2015-06-10 DIAGNOSIS — T68XXXA Hypothermia, initial encounter: Secondary | ICD-10-CM | POA: Diagnosis present

## 2015-06-10 DIAGNOSIS — E1122 Type 2 diabetes mellitus with diabetic chronic kidney disease: Secondary | ICD-10-CM | POA: Diagnosis present

## 2015-06-10 DIAGNOSIS — K7200 Acute and subacute hepatic failure without coma: Secondary | ICD-10-CM | POA: Diagnosis present

## 2015-06-10 DIAGNOSIS — A419 Sepsis, unspecified organism: Principal | ICD-10-CM | POA: Diagnosis present

## 2015-06-10 DIAGNOSIS — Z7902 Long term (current) use of antithrombotics/antiplatelets: Secondary | ICD-10-CM

## 2015-06-10 DIAGNOSIS — Z95828 Presence of other vascular implants and grafts: Secondary | ICD-10-CM

## 2015-06-10 DIAGNOSIS — K219 Gastro-esophageal reflux disease without esophagitis: Secondary | ICD-10-CM | POA: Diagnosis present

## 2015-06-10 DIAGNOSIS — I2510 Atherosclerotic heart disease of native coronary artery without angina pectoris: Secondary | ICD-10-CM | POA: Diagnosis present

## 2015-06-10 DIAGNOSIS — Z955 Presence of coronary angioplasty implant and graft: Secondary | ICD-10-CM

## 2015-06-10 DIAGNOSIS — R740 Nonspecific elevation of levels of transaminase and lactic acid dehydrogenase [LDH]: Secondary | ICD-10-CM

## 2015-06-10 DIAGNOSIS — I6521 Occlusion and stenosis of right carotid artery: Secondary | ICD-10-CM | POA: Diagnosis present

## 2015-06-10 DIAGNOSIS — Z950 Presence of cardiac pacemaker: Secondary | ICD-10-CM

## 2015-06-10 DIAGNOSIS — E785 Hyperlipidemia, unspecified: Secondary | ICD-10-CM | POA: Diagnosis present

## 2015-06-10 DIAGNOSIS — E876 Hypokalemia: Secondary | ICD-10-CM | POA: Diagnosis present

## 2015-06-10 DIAGNOSIS — N184 Chronic kidney disease, stage 4 (severe): Secondary | ICD-10-CM | POA: Diagnosis present

## 2015-06-10 DIAGNOSIS — C50919 Malignant neoplasm of unspecified site of unspecified female breast: Secondary | ICD-10-CM | POA: Diagnosis present

## 2015-06-10 DIAGNOSIS — K559 Vascular disorder of intestine, unspecified: Secondary | ICD-10-CM | POA: Diagnosis present

## 2015-06-10 DIAGNOSIS — I4891 Unspecified atrial fibrillation: Secondary | ICD-10-CM | POA: Diagnosis present

## 2015-06-10 DIAGNOSIS — L8990 Pressure ulcer of unspecified site, unspecified stage: Secondary | ICD-10-CM | POA: Insufficient documentation

## 2015-06-10 DIAGNOSIS — R109 Unspecified abdominal pain: Secondary | ICD-10-CM | POA: Diagnosis not present

## 2015-06-10 DIAGNOSIS — R197 Diarrhea, unspecified: Secondary | ICD-10-CM | POA: Insufficient documentation

## 2015-06-10 DIAGNOSIS — Z96653 Presence of artificial knee joint, bilateral: Secondary | ICD-10-CM | POA: Diagnosis present

## 2015-06-10 DIAGNOSIS — K5730 Diverticulosis of large intestine without perforation or abscess without bleeding: Secondary | ICD-10-CM | POA: Diagnosis present

## 2015-06-10 DIAGNOSIS — R079 Chest pain, unspecified: Secondary | ICD-10-CM

## 2015-06-10 DIAGNOSIS — Z886 Allergy status to analgesic agent status: Secondary | ICD-10-CM

## 2015-06-10 DIAGNOSIS — Z8249 Family history of ischemic heart disease and other diseases of the circulatory system: Secondary | ICD-10-CM

## 2015-06-10 DIAGNOSIS — D649 Anemia, unspecified: Secondary | ICD-10-CM | POA: Diagnosis present

## 2015-06-10 DIAGNOSIS — R7401 Elevation of levels of liver transaminase levels: Secondary | ICD-10-CM

## 2015-06-10 DIAGNOSIS — I9589 Other hypotension: Secondary | ICD-10-CM

## 2015-06-10 DIAGNOSIS — F17210 Nicotine dependence, cigarettes, uncomplicated: Secondary | ICD-10-CM | POA: Diagnosis present

## 2015-06-10 DIAGNOSIS — Z7982 Long term (current) use of aspirin: Secondary | ICD-10-CM

## 2015-06-10 DIAGNOSIS — N179 Acute kidney failure, unspecified: Secondary | ICD-10-CM | POA: Diagnosis present

## 2015-06-10 DIAGNOSIS — R6521 Severe sepsis with septic shock: Secondary | ICD-10-CM | POA: Diagnosis present

## 2015-06-10 DIAGNOSIS — Z66 Do not resuscitate: Secondary | ICD-10-CM | POA: Diagnosis present

## 2015-06-10 DIAGNOSIS — I5032 Chronic diastolic (congestive) heart failure: Secondary | ICD-10-CM | POA: Diagnosis present

## 2015-06-10 DIAGNOSIS — E872 Acidosis: Secondary | ICD-10-CM | POA: Diagnosis present

## 2015-06-10 DIAGNOSIS — I248 Other forms of acute ischemic heart disease: Secondary | ICD-10-CM | POA: Diagnosis present

## 2015-06-10 DIAGNOSIS — F0390 Unspecified dementia without behavioral disturbance: Secondary | ICD-10-CM | POA: Diagnosis present

## 2015-06-10 DIAGNOSIS — I495 Sick sinus syndrome: Secondary | ICD-10-CM | POA: Diagnosis present

## 2015-06-10 DIAGNOSIS — Z515 Encounter for palliative care: Secondary | ICD-10-CM | POA: Diagnosis present

## 2015-06-10 LAB — I-STAT CHEM 8, ED
BUN: 62 mg/dL — AB (ref 6–20)
CALCIUM ION: 0.67 mmol/L — AB (ref 1.13–1.30)
CHLORIDE: 113 mmol/L — AB (ref 101–111)
Creatinine, Ser: 3.5 mg/dL — ABNORMAL HIGH (ref 0.44–1.00)
Glucose, Bld: 98 mg/dL (ref 65–99)
HEMATOCRIT: 25 % — AB (ref 36.0–46.0)
HEMOGLOBIN: 8.5 g/dL — AB (ref 12.0–15.0)
POTASSIUM: 3.4 mmol/L — AB (ref 3.5–5.1)
SODIUM: 147 mmol/L — AB (ref 135–145)
TCO2: 14 mmol/L (ref 0–100)

## 2015-06-10 MED ORDER — VANCOMYCIN HCL IN DEXTROSE 1-5 GM/200ML-% IV SOLN
1000.0000 mg | Freq: Once | INTRAVENOUS | Status: AC
  Administered 2015-06-11: 1000 mg via INTRAVENOUS
  Filled 2015-06-10: qty 200

## 2015-06-10 MED ORDER — SODIUM CHLORIDE 0.9 % IV BOLUS (SEPSIS)
1000.0000 mL | INTRAVENOUS | Status: AC
  Administered 2015-06-10 – 2015-06-11 (×2): 1000 mL via INTRAVENOUS

## 2015-06-10 MED ORDER — PIPERACILLIN-TAZOBACTAM 3.375 G IVPB 30 MIN
3.3750 g | Freq: Once | INTRAVENOUS | Status: AC
  Administered 2015-06-11: 3.375 g via INTRAVENOUS
  Filled 2015-06-10: qty 50

## 2015-06-10 MED ORDER — METRONIDAZOLE IN NACL 5-0.79 MG/ML-% IV SOLN
500.0000 mg | Freq: Once | INTRAVENOUS | Status: AC
  Administered 2015-06-11: 500 mg via INTRAVENOUS
  Filled 2015-06-10: qty 100

## 2015-06-10 NOTE — ED Provider Notes (Signed)
Medical screening examination performed. Reportedly patient has had diarrheal stools suspected of having C. difficile. SNF nursing has noted to have mental status change and abdominal pain. Patient poor she has abdominal pain that she believes is been there for at least several days. She indicates her left upper quadrant. On examination the patient is pale and ill in appearance. She is alert and answering questions appropriate. She does not have respiratory distress. Heart rate is regular. Lungs are clear. She endorses discomfort in the left upper quadrant to palpation but does not have guarding. She has a large amount of green stool of which she has been incontinent. Lower extremities do not have significant edema. Sepsis protocol initiated. Fluid resuscitation initiated.  Charlesetta Shanks, MD 06/03/2015 2320

## 2015-06-10 NOTE — ED Notes (Signed)
Pt comes from [facility] via [facility], EMS was called out for AMS and abd pain, pt answering questions approprietly for EMS, staff at nursing home suspected c diff and had pt on precautions, lack of appetite. Oral temp 96.8

## 2015-06-10 NOTE — ED Notes (Signed)
Bear hugger applied 

## 2015-06-11 ENCOUNTER — Inpatient Hospital Stay (HOSPITAL_COMMUNITY): Payer: Medicare Other

## 2015-06-11 ENCOUNTER — Emergency Department (HOSPITAL_COMMUNITY): Payer: Medicare Other

## 2015-06-11 DIAGNOSIS — R1010 Upper abdominal pain, unspecified: Secondary | ICD-10-CM

## 2015-06-11 DIAGNOSIS — K219 Gastro-esophageal reflux disease without esophagitis: Secondary | ICD-10-CM | POA: Diagnosis present

## 2015-06-11 DIAGNOSIS — R7401 Elevation of levels of liver transaminase levels: Secondary | ICD-10-CM | POA: Insufficient documentation

## 2015-06-11 DIAGNOSIS — Z955 Presence of coronary angioplasty implant and graft: Secondary | ICD-10-CM | POA: Diagnosis not present

## 2015-06-11 DIAGNOSIS — K7200 Acute and subacute hepatic failure without coma: Secondary | ICD-10-CM | POA: Diagnosis present

## 2015-06-11 DIAGNOSIS — R197 Diarrhea, unspecified: Secondary | ICD-10-CM | POA: Diagnosis not present

## 2015-06-11 DIAGNOSIS — R740 Nonspecific elevation of levels of transaminase and lactic acid dehydrogenase [LDH]: Secondary | ICD-10-CM | POA: Diagnosis not present

## 2015-06-11 DIAGNOSIS — Z7902 Long term (current) use of antithrombotics/antiplatelets: Secondary | ICD-10-CM | POA: Diagnosis not present

## 2015-06-11 DIAGNOSIS — R6521 Severe sepsis with septic shock: Secondary | ICD-10-CM | POA: Diagnosis present

## 2015-06-11 DIAGNOSIS — I959 Hypotension, unspecified: Secondary | ICD-10-CM

## 2015-06-11 DIAGNOSIS — I495 Sick sinus syndrome: Secondary | ICD-10-CM | POA: Diagnosis present

## 2015-06-11 DIAGNOSIS — N179 Acute kidney failure, unspecified: Secondary | ICD-10-CM | POA: Diagnosis not present

## 2015-06-11 DIAGNOSIS — R109 Unspecified abdominal pain: Secondary | ICD-10-CM | POA: Diagnosis present

## 2015-06-11 DIAGNOSIS — R571 Hypovolemic shock: Secondary | ICD-10-CM

## 2015-06-11 DIAGNOSIS — A419 Sepsis, unspecified organism: Principal | ICD-10-CM

## 2015-06-11 DIAGNOSIS — C50919 Malignant neoplasm of unspecified site of unspecified female breast: Secondary | ICD-10-CM | POA: Diagnosis present

## 2015-06-11 DIAGNOSIS — Z79899 Other long term (current) drug therapy: Secondary | ICD-10-CM | POA: Diagnosis not present

## 2015-06-11 DIAGNOSIS — Z96653 Presence of artificial knee joint, bilateral: Secondary | ICD-10-CM | POA: Diagnosis present

## 2015-06-11 DIAGNOSIS — L8990 Pressure ulcer of unspecified site, unspecified stage: Secondary | ICD-10-CM | POA: Insufficient documentation

## 2015-06-11 DIAGNOSIS — Z886 Allergy status to analgesic agent status: Secondary | ICD-10-CM | POA: Diagnosis not present

## 2015-06-11 DIAGNOSIS — J449 Chronic obstructive pulmonary disease, unspecified: Secondary | ICD-10-CM | POA: Diagnosis present

## 2015-06-11 DIAGNOSIS — I2510 Atherosclerotic heart disease of native coronary artery without angina pectoris: Secondary | ICD-10-CM | POA: Diagnosis present

## 2015-06-11 DIAGNOSIS — I9589 Other hypotension: Secondary | ICD-10-CM | POA: Diagnosis not present

## 2015-06-11 DIAGNOSIS — N184 Chronic kidney disease, stage 4 (severe): Secondary | ICD-10-CM | POA: Diagnosis present

## 2015-06-11 DIAGNOSIS — E876 Hypokalemia: Secondary | ICD-10-CM | POA: Diagnosis present

## 2015-06-11 DIAGNOSIS — T68XXXA Hypothermia, initial encounter: Secondary | ICD-10-CM | POA: Diagnosis present

## 2015-06-11 DIAGNOSIS — I5032 Chronic diastolic (congestive) heart failure: Secondary | ICD-10-CM | POA: Diagnosis present

## 2015-06-11 DIAGNOSIS — Z950 Presence of cardiac pacemaker: Secondary | ICD-10-CM | POA: Diagnosis not present

## 2015-06-11 DIAGNOSIS — Z8249 Family history of ischemic heart disease and other diseases of the circulatory system: Secondary | ICD-10-CM | POA: Diagnosis not present

## 2015-06-11 DIAGNOSIS — E1151 Type 2 diabetes mellitus with diabetic peripheral angiopathy without gangrene: Secondary | ICD-10-CM | POA: Diagnosis present

## 2015-06-11 DIAGNOSIS — I4891 Unspecified atrial fibrillation: Secondary | ICD-10-CM | POA: Diagnosis present

## 2015-06-11 DIAGNOSIS — I6521 Occlusion and stenosis of right carotid artery: Secondary | ICD-10-CM | POA: Diagnosis present

## 2015-06-11 DIAGNOSIS — E785 Hyperlipidemia, unspecified: Secondary | ICD-10-CM | POA: Diagnosis present

## 2015-06-11 DIAGNOSIS — D649 Anemia, unspecified: Secondary | ICD-10-CM | POA: Diagnosis present

## 2015-06-11 DIAGNOSIS — E872 Acidosis: Secondary | ICD-10-CM | POA: Diagnosis present

## 2015-06-11 DIAGNOSIS — Z7982 Long term (current) use of aspirin: Secondary | ICD-10-CM | POA: Diagnosis not present

## 2015-06-11 DIAGNOSIS — K5730 Diverticulosis of large intestine without perforation or abscess without bleeding: Secondary | ICD-10-CM | POA: Diagnosis present

## 2015-06-11 DIAGNOSIS — F0390 Unspecified dementia without behavioral disturbance: Secondary | ICD-10-CM | POA: Diagnosis present

## 2015-06-11 DIAGNOSIS — I248 Other forms of acute ischemic heart disease: Secondary | ICD-10-CM | POA: Diagnosis present

## 2015-06-11 DIAGNOSIS — E1122 Type 2 diabetes mellitus with diabetic chronic kidney disease: Secondary | ICD-10-CM | POA: Diagnosis present

## 2015-06-11 DIAGNOSIS — Z515 Encounter for palliative care: Secondary | ICD-10-CM | POA: Diagnosis present

## 2015-06-11 DIAGNOSIS — F17210 Nicotine dependence, cigarettes, uncomplicated: Secondary | ICD-10-CM | POA: Diagnosis present

## 2015-06-11 DIAGNOSIS — Z66 Do not resuscitate: Secondary | ICD-10-CM | POA: Diagnosis present

## 2015-06-11 DIAGNOSIS — K559 Vascular disorder of intestine, unspecified: Secondary | ICD-10-CM | POA: Diagnosis present

## 2015-06-11 LAB — I-STAT TROPONIN, ED: Troponin i, poc: 0.41 ng/mL (ref 0.00–0.08)

## 2015-06-11 LAB — CBC WITH DIFFERENTIAL/PLATELET
BASOS ABS: 0 10*3/uL (ref 0.0–0.1)
BASOS PCT: 0 %
EOS ABS: 0 10*3/uL (ref 0.0–0.7)
Eosinophils Relative: 0 %
HCT: 24.7 % — ABNORMAL LOW (ref 36.0–46.0)
HEMOGLOBIN: 8.4 g/dL — AB (ref 12.0–15.0)
Lymphocytes Relative: 5 %
Lymphs Abs: 0.5 10*3/uL — ABNORMAL LOW (ref 0.7–4.0)
MCH: 33.3 pg (ref 26.0–34.0)
MCHC: 34 g/dL (ref 30.0–36.0)
MCV: 98 fL (ref 78.0–100.0)
Monocytes Absolute: 0.7 10*3/uL (ref 0.1–1.0)
Monocytes Relative: 8 %
NEUTROS PCT: 87 %
Neutro Abs: 7.8 10*3/uL — ABNORMAL HIGH (ref 1.7–7.7)
PLATELETS: 234 10*3/uL (ref 150–400)
RBC: 2.52 MIL/uL — AB (ref 3.87–5.11)
RDW: 15.2 % (ref 11.5–15.5)
WBC: 8.9 10*3/uL (ref 4.0–10.5)

## 2015-06-11 LAB — COMPREHENSIVE METABOLIC PANEL
ALK PHOS: 182 U/L — AB (ref 38–126)
ALK PHOS: 93 U/L (ref 38–126)
ALT: 932 U/L — AB (ref 14–54)
ALT: 496 U/L — AB (ref 14–54)
ANION GAP: 27 — AB (ref 5–15)
AST: 1881 U/L — ABNORMAL HIGH (ref 15–41)
AST: 882 U/L — ABNORMAL HIGH (ref 15–41)
Albumin: 1.3 g/dL — ABNORMAL LOW (ref 3.5–5.0)
Albumin: 2.4 g/dL — ABNORMAL LOW (ref 3.5–5.0)
Anion gap: 12 (ref 5–15)
BILIRUBIN TOTAL: 0.3 mg/dL (ref 0.3–1.2)
BUN: 67 mg/dL — ABNORMAL HIGH (ref 6–20)
BUN: 86 mg/dL — ABNORMAL HIGH (ref 6–20)
CALCIUM: 6.9 mg/dL — AB (ref 8.9–10.3)
CALCIUM: 4.9 mg/dL — AB (ref 8.9–10.3)
CHLORIDE: 116 mmol/L — AB (ref 101–111)
CO2: 16 mmol/L — ABNORMAL LOW (ref 22–32)
CO2: 18 mmol/L — ABNORMAL LOW (ref 22–32)
CREATININE: 5.56 mg/dL — AB (ref 0.44–1.00)
CREATININE: 3.73 mg/dL — AB (ref 0.44–1.00)
Chloride: 94 mmol/L — ABNORMAL LOW (ref 101–111)
GFR, EST AFRICAN AMERICAN: 7 mL/min — AB (ref >60)
GFR, EST AFRICAN AMERICAN: 11 mL/min — AB (ref >60)
GFR, EST NON AFRICAN AMERICAN: 6 mL/min — AB (ref >60)
GFR, EST NON AFRICAN AMERICAN: 10 mL/min — AB (ref >60)
Glucose, Bld: 106 mg/dL — ABNORMAL HIGH (ref 65–99)
Glucose, Bld: 281 mg/dL — ABNORMAL HIGH (ref 65–99)
Potassium: 3.4 mmol/L — ABNORMAL LOW (ref 3.5–5.1)
Potassium: 5.3 mmol/L — ABNORMAL HIGH (ref 3.5–5.1)
Sodium: 139 mmol/L (ref 135–145)
Sodium: 144 mmol/L (ref 135–145)
TOTAL PROTEIN: 3.5 g/dL — AB (ref 6.5–8.1)
Total Bilirubin: 0.9 mg/dL (ref 0.3–1.2)
Total Protein: 6.3 g/dL — ABNORMAL LOW (ref 6.5–8.1)

## 2015-06-11 LAB — I-STAT CG4 LACTIC ACID, ED
Lactic Acid, Venous: 2.32 mmol/L (ref 0.5–2.0)
Lactic Acid, Venous: 2.91 mmol/L (ref 0.5–2.0)

## 2015-06-11 LAB — I-STAT ARTERIAL BLOOD GAS, ED
Acid-base deficit: 8 mmol/L — ABNORMAL HIGH (ref 0.0–2.0)
Bicarbonate: 18.4 mEq/L — ABNORMAL LOW (ref 20.0–24.0)
O2 SAT: 94 %
PCO2 ART: 38.5 mmHg (ref 35.0–45.0)
PO2 ART: 73 mmHg — AB (ref 80.0–100.0)
Patient temperature: 95.2
TCO2: 20 mmol/L (ref 0–100)
pH, Arterial: 7.278 — ABNORMAL LOW (ref 7.350–7.450)

## 2015-06-11 LAB — URINALYSIS, ROUTINE W REFLEX MICROSCOPIC
Glucose, UA: 100 mg/dL — AB
Ketones, ur: 15 mg/dL — AB
NITRITE: POSITIVE — AB
SPECIFIC GRAVITY, URINE: 1.025 (ref 1.005–1.030)
pH: 6 (ref 5.0–8.0)

## 2015-06-11 LAB — MAGNESIUM: MAGNESIUM: 2 mg/dL (ref 1.7–2.4)

## 2015-06-11 LAB — TYPE AND SCREEN
ABO/RH(D): O POS
ANTIBODY SCREEN: NEGATIVE

## 2015-06-11 LAB — CBC
HCT: 35.6 % — ABNORMAL LOW (ref 36.0–46.0)
Hemoglobin: 12 g/dL (ref 12.0–15.0)
MCH: 33 pg (ref 26.0–34.0)
MCHC: 33.7 g/dL (ref 30.0–36.0)
MCV: 97.8 fL (ref 78.0–100.0)
PLATELETS: 342 10*3/uL (ref 150–400)
RBC: 3.64 MIL/uL — AB (ref 3.87–5.11)
RDW: 15.3 % (ref 11.5–15.5)
WBC: 5.7 10*3/uL (ref 4.0–10.5)

## 2015-06-11 LAB — URINE MICROSCOPIC-ADD ON

## 2015-06-11 LAB — C DIFFICILE QUICK SCREEN W PCR REFLEX
C DIFFICILE (CDIFF) INTERP: NEGATIVE
C DIFFICILE (CDIFF) TOXIN: NEGATIVE
C Diff antigen: NEGATIVE

## 2015-06-11 LAB — MRSA PCR SCREENING: MRSA BY PCR: POSITIVE — AB

## 2015-06-11 LAB — CORTISOL: Cortisol, Plasma: 37.2 ug/dL

## 2015-06-11 LAB — TROPONIN I: Troponin I: 0.21 ng/mL — ABNORMAL HIGH (ref <0.031)

## 2015-06-11 LAB — PHOSPHORUS: PHOSPHORUS: 7.4 mg/dL — AB (ref 2.5–4.6)

## 2015-06-11 LAB — PROTIME-INR
INR: 1.47 (ref 0.00–1.49)
PROTHROMBIN TIME: 17.9 s — AB (ref 11.6–15.2)

## 2015-06-11 LAB — LIPASE, BLOOD: LIPASE: 35 U/L (ref 11–51)

## 2015-06-11 LAB — AMYLASE: Amylase: 172 U/L — ABNORMAL HIGH (ref 28–100)

## 2015-06-11 LAB — POC OCCULT BLOOD, ED: FECAL OCCULT BLD: POSITIVE — AB

## 2015-06-11 LAB — DIGOXIN LEVEL: DIGOXIN LVL: 2.3 ng/mL — AB (ref 0.8–2.0)

## 2015-06-11 MED ORDER — NOREPINEPHRINE BITARTRATE 1 MG/ML IV SOLN
0.0000 ug/min | INTRAVENOUS | Status: DC
  Administered 2015-06-11: 35 ug/min via INTRAVENOUS
  Administered 2015-06-11: 30 ug/min via INTRAVENOUS
  Filled 2015-06-11: qty 4

## 2015-06-11 MED ORDER — NOREPINEPHRINE BITARTRATE 1 MG/ML IV SOLN
0.0000 ug/min | Freq: Once | INTRAVENOUS | Status: AC
  Administered 2015-06-11: 10 ug/min via INTRAVENOUS
  Filled 2015-06-11: qty 4

## 2015-06-11 MED ORDER — SODIUM CHLORIDE 0.9 % IV BOLUS (SEPSIS)
2000.0000 mL | Freq: Once | INTRAVENOUS | Status: AC
  Administered 2015-06-11: 2000 mL via INTRAVENOUS

## 2015-06-11 MED ORDER — DEXTROSE 5 % IV SOLN
10.0000 mg/h | INTRAVENOUS | Status: DC
  Administered 2015-06-11: 2 mg/h via INTRAVENOUS
  Filled 2015-06-11: qty 10

## 2015-06-11 MED ORDER — MORPHINE BOLUS VIA INFUSION
5.0000 mg | INTRAVENOUS | Status: DC | PRN
  Filled 2015-06-11: qty 20

## 2015-06-11 MED ORDER — SODIUM CHLORIDE 0.9 % IV BOLUS (SEPSIS)
500.0000 mL | Freq: Once | INTRAVENOUS | Status: AC
  Administered 2015-06-11: 500 mL via INTRAVENOUS

## 2015-06-11 MED ORDER — SODIUM CHLORIDE 0.9 % IV SOLN
1.0000 g | Freq: Once | INTRAVENOUS | Status: AC
  Administered 2015-06-11: 1 g via INTRAVENOUS
  Filled 2015-06-11: qty 10

## 2015-06-11 MED ORDER — ACETAMINOPHEN 325 MG PO TABS: 650.0000 mg | ORAL_TABLET | ORAL | Status: DC | PRN

## 2015-06-11 MED ORDER — SODIUM CHLORIDE 0.9 % IV BOLUS (SEPSIS)
1000.0000 mL | Freq: Once | INTRAVENOUS | Status: AC
  Administered 2015-06-11: 1000 mL via INTRAVENOUS

## 2015-06-11 MED ORDER — CETYLPYRIDINIUM CHLORIDE 0.05 % MT LIQD
7.0000 mL | Freq: Two times a day (BID) | OROMUCOSAL | Status: DC
Start: 2015-06-11 — End: 2015-06-11
  Administered 2015-06-11: 7 mL via OROMUCOSAL

## 2015-06-11 MED ORDER — SODIUM CHLORIDE 0.9 % IV SOLN
25.0000 ug/h | INTRAVENOUS | Status: DC
  Administered 2015-06-11: 25 ug/h via INTRAVENOUS
  Filled 2015-06-11: qty 50

## 2015-06-11 MED ORDER — CLOPIDOGREL BISULFATE 75 MG PO TABS: 75.0000 mg | ORAL_TABLET | Freq: Every day | ORAL | Status: DC

## 2015-06-11 MED ORDER — SODIUM CHLORIDE 0.9 % IV SOLN
INTRAVENOUS | Status: DC
  Administered 2015-06-11: 09:00:00 via INTRAVENOUS

## 2015-06-11 MED ORDER — SODIUM CHLORIDE 0.9 % IV SOLN: 250.0000 mL | INTRAVENOUS | Status: DC | PRN

## 2015-06-11 MED ORDER — VANCOMYCIN HCL IN DEXTROSE 750-5 MG/150ML-% IV SOLN: 750.0000 mg | INTRAVENOUS | Status: DC

## 2015-06-11 MED ORDER — FENTANYL CITRATE (PF) 100 MCG/2ML IJ SOLN
25.0000 ug | INTRAMUSCULAR | Status: DC | PRN
  Administered 2015-06-11 (×2): 50 ug via INTRAVENOUS
  Filled 2015-06-11 (×2): qty 2

## 2015-06-11 MED ORDER — AMIODARONE HCL 200 MG PO TABS: 200.0000 mg | ORAL_TABLET | Freq: Two times a day (BID) | ORAL | Status: DC

## 2015-06-11 MED ORDER — NOREPINEPHRINE BITARTRATE 1 MG/ML IV SOLN: 0.0000 ug/min | INTRAVENOUS | Status: DC

## 2015-06-11 MED ORDER — NOREPINEPHRINE BITARTRATE 1 MG/ML IV SOLN
0.0000 ug/min | INTRAVENOUS | Status: DC
  Administered 2015-06-11 (×2): 70 ug/min via INTRAVENOUS
  Administered 2015-06-11: 40 ug/min via INTRAVENOUS
  Filled 2015-06-11 (×3): qty 16

## 2015-06-11 MED ORDER — LEVOTHYROXINE SODIUM 100 MCG IV SOLR
12.5000 ug | Freq: Every day | INTRAVENOUS | Status: DC
  Administered 2015-06-11: 12.5 ug via INTRAVENOUS
  Filled 2015-06-11: qty 5

## 2015-06-11 MED ORDER — SODIUM BICARBONATE 8.4 % IV SOLN
INTRAVENOUS | Status: DC
  Administered 2015-06-11: 04:00:00 via INTRAVENOUS
  Filled 2015-06-11: qty 150

## 2015-06-11 MED ORDER — ASPIRIN EC 81 MG PO TBEC: 81.0000 mg | DELAYED_RELEASE_TABLET | Freq: Every day | ORAL | Status: DC

## 2015-06-11 MED ORDER — PHENYLEPHRINE HCL 10 MG/ML IJ SOLN
0.0000 ug/min | Freq: Once | INTRAVENOUS | Status: AC
  Administered 2015-06-11: 20 ug/min via INTRAVENOUS
  Filled 2015-06-11: qty 1

## 2015-06-11 MED ORDER — LEVOTHYROXINE SODIUM 25 MCG PO TABS: 25.0000 ug | ORAL_TABLET | Freq: Every day | ORAL | Status: DC

## 2015-06-11 MED ORDER — VASOPRESSIN 20 UNIT/ML IV SOLN
0.0300 [IU]/min | INTRAVENOUS | Status: DC
  Filled 2015-06-11: qty 2

## 2015-06-11 MED ORDER — PIPERACILLIN-TAZOBACTAM IN DEX 2-0.25 GM/50ML IV SOLN
2.2500 g | Freq: Three times a day (TID) | INTRAVENOUS | Status: DC
  Administered 2015-06-11 (×2): 2.25 g via INTRAVENOUS
  Filled 2015-06-11 (×3): qty 50

## 2015-06-12 LAB — HEPATITIS PANEL, ACUTE
HCV Ab: <0.1 s/co ratio (ref 0.0–0.9)
HEP A IGM: NEGATIVE
HEP B C IGM: NEGATIVE
Hepatitis B Surface Ag: NEGATIVE

## 2015-06-13 LAB — URINE CULTURE: Culture: >=100000

## 2015-06-16 LAB — CULTURE, BLOOD (ROUTINE X 2)
CULTURE: NO GROWTH
Culture: NO GROWTH

## 2015-06-18 NOTE — Progress Notes (Signed)
[facility] Progress Note Patient Name: [name] DOB: 1923-06-16 MRN: [id number]   Date of Service  Jul 04, 2015  HPI/Events of Note  Hypotension - BP = 91/53.  eICU Interventions  Will order: 1. Monitor CVP. 2. ABG at 6 AM. 3. Increase ceiling on Norepinephrine IV infusion to 70 mcg/min.     Intervention Category Intermediate Interventions: Hypotension - evaluation and management  Latanja Lehenbauer [place] 07-04-15, 5:04 AM

## 2015-06-18 NOTE — Progress Notes (Signed)
Central line location verified via X-ray and okay to use per Dr. Ronnald Ramp at the bedside.

## 2015-06-18 NOTE — ED Notes (Signed)
Critical Care at bedside.  

## 2015-06-18 NOTE — Progress Notes (Signed)
240 mL of morphine wasted in sink witnessed by 2 RN's, [place].

## 2015-06-18 NOTE — Progress Notes (Signed)
[facility] Progress Note Patient Name: [name] DOB: 1924-03-07 MRN: [id number]   Date of Service  2015/06/21  HPI/Events of Note  Bedside nurse reports that the patient is writhing in pain. BP only 90/53. Fear that BP is too marginal to tolerate adequate pain relief.   eICU Interventions  Will order: 1. Fentanyl 25-50 mcg IV Q 1 hour PRN.     Intervention Category Intermediate Interventions: Pain - evaluation and management  Julieta Rogalski [place] 2015-06-21, 6:14 AM

## 2015-06-18 NOTE — ED Provider Notes (Signed)
CSN: [id number]     Arrival date & time 06/15/2015  2253 History  By signing my name below, I, Helane Gunther, attest that this documentation has been prepared under the direction and in the presence of Rolland Porter, MD at [place]. Electronically Signed: Helane Gunther, ED Scribe. 07/01/15. 2:09 AM.    Chief Complaint  Patient presents with  . Abdominal Pain  . Hypotension   The history is provided by the patient and a relative. No language interpreter was used.   HPI Comments: Level 5 Caveat (Age)  [name] is a 82 y.o. female with a PMHx of CHF, CAD, A-fib, pacemaker, HLD, DM, COPD, and diverticulosis who presents to the Emergency Department complaining of abdominal pain onset this morning, and hypotension onset 2.5 hours ago. She reports associated chest pain onset this evening in the ED, watery diarrhea, diaphoresis, and chills. Per granddaughter, the nursing home pt lives at called her 3 hours ago to tell her pt was hypotensive, which is when pt was brought to the ED.  She notes pt was c/o the abdominal pain all day today. The granddaughters had been with her and were rubbing her abdomen. They also report a lot of diarrhea. She reports pt has a PMHx of diverticulosis, but until now has not had any flare ups or pains for a while. She notes pt has not been able to walk for the past few years. She reports pt does not smoke, but uses snuff. She also states pt has a golf-ball sized knot on her right breast, but notes that pt will not be undergoing chemotherapy.  She also reports pt has a PMHx of stage 4 kidney disease.   PCP Dr Bubba Camp  Pt is a DNR.   Past Medical History  Diagnosis Date  . Coronary artery disease     a. Acute MI 2003, stent to LCx. b. NSTEMI 2008 (cath w/o PCI).  c. +trop 02/2011 felt 2/2 demand ischemia.  [name] History of atrial flutter 2009    a. s/p ablation of typical flutter 09/2007.  [name] Paroxysmal atrial fibrillation ([place])     a. Dx 2009. b. Discontinuation of Coumadin  01/2008 2/2 hemarthrosis/chronic debilitation.  [name] COPD (chronic obstructive pulmonary disease) ([place])   . Hyperlipidemia   . Diabetes mellitus   . Diastolic congestive heart failure ([place])   . Arthritis   . Chronic kidney disease   . Carotid artery disease (H[facility])     a. Bilateral  . SSS (sick sinus syndrome) ([place])     a. Symptomatic bradycardia/syncope and post-termination pauses - St. Jude dual chamber PPM 09/2007.   [name] Pneumonia   . Anxiety     adjustment disorder  . Pacemaker   . Mesenteric ischemia ([place])     a. Possible dx, 2013.  . Diverticula of colon     a. Colonoscopy 01/2012.  [name] PVD (peripheral vascular disease) ([place])     Carotid dz as noted, repoted aortoiliac occlusive dz, also reported high-grade stenosis at the origin & prox innominate artery, high-grade stenosis of the right carotid bifurcation, moderate stenosis of the proximal prevertebral left subclavian artery by CT 02/2011  . Pituitary tumor ([place])     s/p gamma knife  . Tachy-brady syndrome ([place])   . ?? Subclavian artery stenosis, right 04/04/2012  . SMA stenosis-moderate 04/04/2012  . Diverticulosis of colon (without mention of hemorrhage) 01/27/2012  . history of PITUITARY ADENOMA- s/p gamma knife 10/27/2006    Qualifier: Diagnosis of  By: Maxie Better FNP,  Rosalita Levan   . CAROTID BRUIT, RIGHT 04/26/2007    Qualifier: Diagnosis of  By: Maxie Better FNP, Rosalita Levan   . Cardiac pacemaker in situ 09/15/2009    Qualifier: Diagnosis of  By: Lovena Le, MD, Martyn Malay   . GERD (gastroesophageal reflux disease) 08/09/2013  . Pressure ulcer, heel 06/28/2013   Past Surgical History  Procedure Laterality Date  . Cardiac catheterization      2003 cardiac stent  . Insert / replace / remove pacemaker  2009    St. Jude  . Cholecystectomy    . Total knee arthroplasty      bilateral  . Bilateral hip arthroscopy    . 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 Myocardial perfusion  study      EF 59% 4/04  . Adenosine myoview study      abn EF 53% 12/04/04  . Carotid doppler  2/99  . Echo with lvh  1/98    old records  . Stress cardiolite  3/04    EF 53%  . Gamma knife radiosurg  08/25/06    [facility]  . Pvd      with stent in the right iliac   . Colonoscopy  01/27/2012    Procedure: COLONOSCOPY;  Surgeon: Inda Castle, MD;  Location: [place];  Service: Endoscopy;  Laterality: N/A;  . Abdominal aortagram N/A 01/25/2012    Procedure: ABDOMINAL AORTAGRAM;  Surgeon: Serafina Mitchell, MD;  Location: [facility] CATH [facility];  Service: Cardiovascular;  Laterality: N/A;   Family History  Problem Relation Age of Onset  . Cancer Neg Hx   . Depression      ?  . Diabetes      DM and HBP- family hx   Social History  Substance Use Topics  . Smoking status: Current Every Day Smoker -- 0.25 packs/day for 40 years    Types: Cigarettes    Last Attempt to Quit: 04/02/2012  . Smokeless tobacco: Current User    Types: Snuff  . Alcohol Use: No  lives in [place]  OB History    No data available     Review of Systems  Unable to perform ROS: Age      Allergies  Naproxen and Nsaids  Home Medications   Prior to Admission medications   Medication Sig Start Date End Date Taking? Authorizing Provider  Amino Acids-Protein Hydrolys (FEEDING SUPPLEMENT, PRO-STAT SUGAR FREE 64,) LIQD Take 30 mLs by mouth 2 (two) times daily. For wound healing    Historical Provider, MD  amiodarone (PACERONE) 200 MG tablet Take 1 tablet (200 mg total) by mouth 2 (two) times daily. 09/06/12   Minna Merritts, MD  amLODipine (NORVASC) 10 MG tablet Take 10 mg by mouth daily.     Historical Provider, MD  aspirin 81 MG tablet Take 81 mg by mouth daily.    Historical Provider, MD  calcitRIOL (ROCALTROL) 0.25 MCG capsule Take 0.25 mcg by mouth 3 (three) times a week.    Historical Provider, MD  carvedilol (COREG) 3.125 MG tablet Take 3.125 mg by mouth 2 (two) times daily with a meal.    Historical Provider, MD   Cholecalciferol (VITAMIN D3) 50000 UNITS CAPS Take 1 capsule by mouth once a week.    Historical Provider, MD  citalopram (CELEXA) 10 MG tablet Take 1 tablet (10 mg total) by mouth daily. 11/25/14   Lauree Chandler, NP  cloNIDine (CATAPRES - DOSED IN MG/24  HR) 0.2 mg/24hr patch Place 0.2 mg onto the skin once a week. On Tuesday    Historical Provider, MD  clopidogrel (PLAVIX) 75 MG tablet Take 75 mg by mouth daily with breakfast.    Historical Provider, MD  digoxin (LANOXIN) 0.125 MG tablet Take 1 tablet (0.125 mg total) by mouth every other day. 08/04/12   Minna Merritts, MD  docusate sodium (COLACE) 100 MG capsule Take 100 mg by mouth 2 (two) times daily.  10/13/11   Lucille Passy, MD  ferrous sulfate 325 (65 FE) MG tablet Take 325 mg by mouth daily with breakfast.     Historical Provider, MD  HYDROcodone-acetaminophen (NORCO/VICODIN) 5-325 MG per tablet Take one tablet by mouth every six hours as needed for pain. Do not exceed 4gm in 24 hours 09/23/14   Tiffany L Reed, DO  levothyroxine (SYNTHROID, LEVOTHROID) 25 MCG tablet Take 25 mcg by mouth daily before breakfast.     Historical Provider, MD  mirtazapine (REMERON) 15 MG tablet Take 15 mg by mouth at bedtime.    Historical Provider, MD  polyethylene glycol (MIRALAX / GLYCOLAX) packet Take 17 g by mouth 2 (two) times daily.    Historical Provider, MD  Propylene Glycol (SYSTANE BALANCE) 0.6 % SOLN Apply 1 drop to eye 3 (three) times daily. Both eyes for dry eyes    Historical Provider, MD  ranitidine (ZANTAC) 150 MG capsule Take 150 mg by mouth every morning.    Historical Provider, MD  senna (SENOKOT) 8.6 MG TABS tablet Take 1 tablet by mouth at bedtime.    Historical Provider, MD  Tiotropium Bromide Monohydrate (SPIRIVA RESPIMAT) 2.5 MCG/ACT AERS Inhale 2 Inhalers into the lungs daily. Use with aerochamber 09/13/13   Gerlene Fee, NP  torsemide (DEMADEX) 20 MG tablet Take 20 mg by mouth daily.    Historical Provider, MD  vitamin B-12  (CYANOCOBALAMIN) 1000 MCG tablet Take 1,000 mcg by mouth daily.    Historical Provider, MD  zolpidem (AMBIEN CR) 6.25 MG CR tablet Take one tablet by mouth every night at bedtime for insomnia. Do not crush 06/09/15   Tiffany L Reed, DO   BP 90/53 mmHg  Pulse 64  Temp(Src) 97.7 F (36.5 C) (Rectal)  Resp 23  Ht 5\' 5"  (1.651 m)  Wt 128 lb 1.4 oz (58.1 kg)  BMI 21.31 kg/m2  SpO2 99% Physical Exam  Constitutional: She appears well-developed and well-nourished.  Non-toxic appearance. She does not appear ill. She appears distressed.  Pt is awake, answers some questions, c/o chest pain and abdominal pain  HENT:  Head: Normocephalic and atraumatic.  Right Ear: External ear normal.  Left Ear: External ear normal.  Nose: Nose normal. No mucosal edema or rhinorrhea.  Mouth/Throat: Oropharynx is clear and moist and mucous membranes are normal. No dental abscesses or uvula swelling.  Eyes: Conjunctivae and EOM are normal. Pupils are equal, round, and reactive to light.  Neck: Normal range of motion and full passive range of motion without pain. Neck supple.  Cardiovascular: Normal rate, regular rhythm and normal heart sounds.  Exam reveals no gallop and no friction rub.   No murmur heard. Pulmonary/Chest: Effort normal and breath sounds normal. No respiratory distress. She has no wheezes. She has no rhonchi. She has no rales. She exhibits no tenderness and no crepitus.  3 cm, firm mass in R lateral upper breast.  Abdominal: Soft. Normal appearance and bowel sounds are normal. She exhibits no distension. There is no tenderness. There is  no rebound and no guarding.  Patient points to her left lower abdomen as where her pain is located  Musculoskeletal: Normal range of motion. She exhibits no edema or tenderness.  Moves all extremities well.   Neurological: She is alert. She has normal strength. No cranial nerve deficit.  Skin: Skin is warm, dry and intact. No rash noted. No erythema. No pallor.   Psychiatric: Her speech is delayed. She is slowed.  Nursing note and vitals reviewed.   ED Course  Procedures   Medications  0.9 %  sodium chloride infusion (not administered)  acetaminophen (TYLENOL) tablet 650 mg (not administered)  levothyroxine (SYNTHROID, LEVOTHROID) tablet 25 mcg (not administered)  clopidogrel (PLAVIX) tablet 75 mg (not administered)  amiodarone (PACERONE) tablet 200 mg (not administered)  aspirin EC tablet 81 mg (not administered)  sodium bicarbonate 150 mEq in dextrose 5 % 1,000 mL infusion ( Intravenous New Bag/Given July 04, 2015 0418)  piperacillin-tazobactam (ZOSYN) IVPB 2.25 g (not administered)  vancomycin (VANCOCIN) IVPB 750 mg/150 ml premix (not administered)  norepinephrine (LEVOPHED) 16 mg in dextrose 5 % 250 mL (0.064 mg/mL) infusion (45 mcg/min Intravenous Rate/Dose Change 04-Jul-2015 0623)  sodium chloride 0.9 % bolus 2,000 mL (2,000 mLs Intravenous Given Jul 04, 2015 0602)  fentaNYL (SUBLIMAZE) injection 25-50 mcg (50 mcg Intravenous Given 07/04/15 0630)  sodium chloride 0.9 % bolus 1,000 mL (0 mLs Intravenous Stopped 07-04-15 0215)  metroNIDAZOLE (FLAGYL) IVPB 500 mg (0 mg Intravenous Stopped 07-04-2015 0141)  vancomycin (VANCOCIN) IVPB 1000 mg/200 mL premix (0 mg Intravenous Stopped July 04, 2015 0216)  piperacillin-tazobactam (ZOSYN) IVPB 3.375 g (0 g Intravenous Stopped July 04, 2015 0056)  phenylephrine (NEO-SYNEPHRINE) 10 mg in dextrose 5 % 250 mL (0.04 mg/mL) infusion (0 mcg/min Intravenous Stopped 2015-07-04 0229)  norepinephrine (LEVOPHED) 4 mg in dextrose 5 % 250 mL (0.016 mg/mL) infusion (11 mcg/min Intravenous Rate/Dose Change 07/04/2015 0315)  sodium chloride 0.9 % bolus 1,000 mL (1,000 mLs Intravenous New Bag/Given 2015/07/04 0214)  calcium gluconate 1 g in sodium chloride 0.9 % 100 mL IVPB (1 g Intravenous Given 07/04/15 0500)     DIAGNOSTIC STUDIES: Oxygen Saturation is 96% on RA, adequate by my interpretation.    COORDINATION OF CARE: 12:14 AM - Discussed plans to  admit. Sepsis protocol was initiated. Her granddaughters verify the DO NOT RESUSCITATE order. She was given 2500 mL IV fluid in her blood pressure was still 60/30. This point she was started on a Neo-Synephrine IV drip.   Patient was started on Zosyn, vancomycin, and Flagyl IV.  Portable x-rays were ordered because patient is too unstable to go for CT scans for further evaluation of her abdominal pain.  Patient was discussed with Dr. Janann Colonel, critical care at 01 47. He's requested a ABG and agrees with starting pressors. He states he will have the critical care team come for admission.  Patient's blood pressure actually dropped after she was started on the Neo-Synephrine drip and norepinephrine was added to her pressors.  Labs Review Results for orders placed or performed during the hospital encounter of 05/31/2015  C difficile quick scan w PCR reflex  Result Value Ref Range   C Diff antigen NEGATIVE NEGATIVE   C Diff toxin NEGATIVE NEGATIVE   C Diff interpretation Negative for toxigenic C. difficile   MRSA PCR Screening  Result Value Ref Range   MRSA by PCR POSITIVE (A) NEGATIVE  Comprehensive metabolic panel  Result Value Ref Range   Sodium 144 135 - 145 mmol/L   Potassium 3.4 (L) 3.5 - 5.1 mmol/L  Chloride 116 (H) 101 - 111 mmol/L   CO2 16 (L) 22 - 32 mmol/L   Glucose, Bld 106 (H) 65 - 99 mg/dL   BUN 67 (H) 6 - 20 mg/dL   Creatinine, Ser 3.73 (H) 0.44 - 1.00 mg/dL   Calcium 4.9 (LL) 8.9 - 10.3 mg/dL   Total Protein 3.5 (L) 6.5 - 8.1 g/dL   Albumin 1.3 (L) 3.5 - 5.0 g/dL   AST 882 (H) 15 - 41 U/L   ALT 496 (H) 14 - 54 U/L   Alkaline Phosphatase 93 38 - 126 U/L   Total Bilirubin 0.3 0.3 - 1.2 mg/dL   GFR calc non Af Amer 10 (L) >60 mL/min   GFR calc Af Amer 11 (L) >60 mL/min   Anion gap 12 5 - 15  CBC WITH DIFFERENTIAL  Result Value Ref Range   WBC 8.9 4.0 - 10.5 K/uL   RBC 2.52 (L) 3.87 - 5.11 MIL/uL   Hemoglobin 8.4 (L) 12.0 - 15.0 g/dL   HCT 24.7 (L) 36.0 - 46.0 %   MCV  98.0 78.0 - 100.0 fL   MCH 33.3 26.0 - 34.0 pg   MCHC 34.0 30.0 - 36.0 g/dL   RDW 15.2 11.5 - 15.5 %   Platelets 234 150 - 400 K/uL   Neutrophils Relative % 87 %   Neutro Abs 7.8 (H) 1.7 - 7.7 K/uL   Lymphocytes Relative 5 %   Lymphs Abs 0.5 (L) 0.7 - 4.0 K/uL   Monocytes Relative 8 %   Monocytes Absolute 0.7 0.1 - 1.0 K/uL   Eosinophils Relative 0 %   Eosinophils Absolute 0.0 0.0 - 0.7 K/uL   Basophils Relative 0 %   Basophils Absolute 0.0 0.0 - 0.1 K/uL  Urinalysis, Routine w reflex microscopic (not at [facility])  Result Value Ref Range   Color, Urine YELLOW YELLOW   APPearance CLOUDY (A) CLEAR   Specific Gravity, Urine 1.025 1.005 - 1.030   pH 6.0 5.0 - 8.0   Glucose, UA 100 (A) NEGATIVE mg/dL   Hgb urine dipstick MODERATE (A) NEGATIVE   Bilirubin Urine SMALL (A) NEGATIVE   Ketones, ur 15 (A) NEGATIVE mg/dL   Protein, ur >300 (A) NEGATIVE mg/dL   Nitrite POSITIVE (A) NEGATIVE   Leukocytes, UA LARGE (A) NEGATIVE  Digoxin level  Result Value Ref Range   Digoxin Level 2.3 (H) 0.8 - 2.0 ng/mL  Urine microscopic-add on  Result Value Ref Range   Squamous Epithelial / LPF 6-30 (A) NONE SEEN   WBC, UA TOO NUMEROUS TO COUNT 0 - 5 WBC/hpf   RBC / HPF 6-30 0 - 5 RBC/hpf   Bacteria, UA MANY (A) NONE SEEN   Casts HYALINE CASTS (A) NEGATIVE   Urine-Other LESS THAN 10 mL OF URINE SUBMITTED   Cortisol  Result Value Ref Range   Cortisol, Plasma 37.2 ug/dL  Protime-INR  Result Value Ref Range   Prothrombin Time 17.9 (H) 11.6 - 15.2 seconds   INR 1.47 0.00 - 1.49  I-Stat CG4 Lactic Acid, ED  (not at  [facility])  Result Value Ref Range   Lactic Acid, Venous 2.91 (HH) 0.5 - 2.0 mmol/L   Comment NOTIFIED PHYSICIAN   I-stat chem 8, ed  Result Value Ref Range   Sodium 147 (H) 135 - 145 mmol/L   Potassium 3.4 (L) 3.5 - 5.1 mmol/L   Chloride 113 (H) 101 - 111 mmol/L   BUN 62 (H) 6 - 20 mg/dL   Creatinine, Ser  3.50 (H) 0.44 - 1.00 mg/dL   Glucose, Bld 98 65 - 99 mg/dL   Calcium, Ion 0.67 (L)  1.13 - 1.30 mmol/L   TCO2 14 0 - 100 mmol/L   Hemoglobin 8.5 (L) 12.0 - 15.0 g/dL   HCT 25.0 (L) 36.0 - 46.0 %   Comment NOTIFIED PHYSICIAN   POC occult blood, ED  Result Value Ref Range   Fecal Occult Bld POSITIVE (A) NEGATIVE  I-stat troponin, ED  Result Value Ref Range   Troponin i, poc 0.41 (HH) 0.00 - 0.08 ng/mL   Comment NOTIFIED PHYSICIAN    Comment 3          I-Stat CG4 Lactic Acid, ED  (not at  [facility])  Result Value Ref Range   Lactic Acid, Venous 2.32 (HH) 0.5 - 2.0 mmol/L   Comment NOTIFIED PHYSICIAN   I-Stat arterial blood gas, ED  Result Value Ref Range   pH, Arterial 7.278 (L) 7.350 - 7.450   pCO2 arterial 38.5 35.0 - 45.0 mmHg   pO2, Arterial 73.0 (L) 80.0 - 100.0 mmHg   Bicarbonate 18.4 (L) 20.0 - 24.0 mEq/L   TCO2 20 0 - 100 mmol/L   O2 Saturation 94.0 %   Acid-base deficit 8.0 (H) 0.0 - 2.0 mmol/L   Patient temperature 95.2 F    Collection site RADIAL, ALLEN'S TEST ACCEPTABLE    Drawn by RT    Sample type ARTERIAL   Type and screen If need to transfuse blood products please use the blood administration order set  Result Value Ref Range   ABO/RH(D) O POS    Antibody Screen NEG    Sample Expiration 06/14/2015    Laboratory interpretation all normal except metabolic acidosis, positive troponin, positive stool for blood, worsening chronic renal insufficiency, probable UTI     Imaging Review Dg Chest Port 1 View  07/03/2015  CLINICAL DATA:  Right IJ line placement.  Initial encounter. EXAM: PORTABLE CHEST 1 VIEW COMPARISON:  Chest radiograph performed 05/26/2015 FINDINGS: The patient's right IJ line is noted ending about the mid to distal SVC. Minimal bilateral atelectasis is noted. No pleural effusion or pneumothorax is seen. The cardiomediastinal silhouette is borderline normal in size. A left-sided pacemaker is noted, with leads ending at the right atrium and right ventricle. No acute osseous abnormalities are identified. IMPRESSION: 1. Right IJ line noted  ending about the mid to distal SVC. 2. Minimal bilateral atelectasis noted. Electronically Signed   By: Garald Balding M.D.   On: 03-Jul-2015 04:21   Dg Chest Port 1 View  05/26/2015  CLINICAL DATA:  Hypotension and shortness of breath. EXAM: PORTABLE CHEST 1 VIEW COMPARISON:  Single view of the chest 02/13/2013. FINDINGS: Pacing device in place. The lungs are clear. Heart size is mildly enlarged. No pneumothorax or pleural effusion. IMPRESSION: Cardiomegaly without acute disease. Electronically Signed   By: Inge Rise M.D.   On: 06/10/2015 23:32   Dg Abd Portable 2v  07-03-2015  CLINICAL DATA:  Abdominal pain and sepsis. EXAM: PORTABLE ABDOMEN - 2 VIEW COMPARISON:  Ultrasound kidneys 07/24/2014.  CT abdomen 02/04/2012 FINDINGS: Gas throughout the colon without abnormal distention. Stool-filled rectum. Gas within some nondistended small bowel loops. No free intra-abdominal air. No abnormal air-fluid levels. Changes likely represent ileus. Surgical clips in the right upper quadrant. Vascular stents in the iliac region. Postoperative changes with bilateral hip arthroplasties. Degenerative changes in the spine. No radiopaque stones identified. IMPRESSION: Gas within nondistended small and large bowel, likely ileus or  enteritis. Stool in the rectum. No evidence of obstruction or free air. Electronically Signed   By: Lucienne Capers M.D.   On: 2015-07-01 00:51   I have personally reviewed and evaluated these images and lab results as part of my medical decision-making.   EKG Interpretation   Date/Time:  Tuesday June 10 2015 23:02:12 EST Ventricular Rate:  74 PR Interval:  624 QRS Duration: 238 QT Interval:  559 QTC Calculation: 620 R Axis:   -83 Text Interpretation:  Sinus rhythm Prolonged PR interval Right atrial  enlargement Left bundle branch block agree. paced Confirmed by Johnney Killian,  MD, Jeannie Done [phone]) on 06/09/2015 11:17:11 PM      MDM   Final diagnoses:  Other specified  hypotension  Diarrhea, unspecified type  Chest pain, unspecified chest pain type  Abdominal pain, unspecified abdominal location   Plan admission  Rolland Porter, MD, [place] Performed by: Rolland Porter L Total critical care time: 40 minutes Critical care time was exclusive of separately billable procedures and treating other patients. Critical care was necessary to treat or prevent imminent or life-threatening deterioration. Critical care was time spent personally by me on the following activities: development of treatment plan with patient and/or surrogate as well as nursing, discussions with consultants, evaluation of patient's response to treatment, examination of patient, obtaining history from patient or surrogate, ordering and performing treatments and interventions, ordering and review of laboratory studies, ordering and review of radiographic studies, pulse oximetry and re-evaluation of patient's condition.   I personally performed the services described in this documentation, which was scribed in my presence. The recorded information has been reviewed and considered.  Rolland Porter, MD, [place] Or, MD Jul 01, 2015 [phone]

## 2015-06-18 NOTE — Care Management Note (Addendum)
Case Management Note  Patient Details  Name: ASLAN CRABLE MRN: [id number] Date of Birth: May 01, 1924  Subjective/Objective:         Adm w hypotension         Action/Plan: lives at nsg facility, will make sw ref   Expected Discharge Date:                  Expected Discharge Plan:     In-House Referral:     Discharge planning Services     Post Acute Care Choice:    Choice offered to:     DME Arranged:    DME Agency:     HH Arranged:    [place] Agency:     Status of Service:     Medicare Important Message Given:    Date Medicare IM Given:    Medicare IM give by:    Date Additional Medicare IM Given:    Additional Medicare Important Message give by:     If discussed at [place] of Stay Meetings, dates discussed:    Additional Comments: ur review done  Lacretia Leigh, RN 06-19-2015, 8:21 AM

## 2015-06-18 NOTE — Progress Notes (Addendum)
PULMONARY / CRITICAL CARE MEDICINE   Name: [name] MRN: [id number] DOB: Oct 11, 1923    ADMISSION DATE:  05/30/2015  REFERRING MD:  ED  CHIEF COMPLAINT:  Diarrhea, hypotension  HISTORY OF PRESENT ILLNESS:   [name] is a 82 y.o. female w/ PMHx of chronic dCHF, CKD stage 4, Sick Sinus Syndrome s/p PPM, PVD, CA stenosis, h/o mesenteric ischemia (2013?), and likely breast CA (does not wish to pursue further treatment/workup), presents to the ED from nursing home w/ complaints of diarrhea, abdominal pain, and hypotension. Per family at bedside, diarrhea and abdominal pain started this AM, and significant hypotension started this evening, resulting in ED visit. Patient otherwise denies any other issues.   SUBJECTIVE:  High pressor needs  VITAL SIGNS: BP 80/45 mmHg  Pulse 74  Temp(Src) 96.3 F (35.7 C) (Axillary)  Resp 19  Ht 5\' 5"  (1.651 m)  Wt 58.1 kg (128 lb 1.4 oz)  BMI 21.31 kg/m2  SpO2 100%  HEMODYNAMICS: CVP:  [5 mmHg] 5 mmHg  INTAKE / OUTPUT: I/O last 3 completed shifts: In: 5095.2 [I.V.:4095.2; IV Piggyback:1000] Out: -   PHYSICAL EXAMINATION: General:  Thin appearing,pain distress Neuro: A&O x2. Follows commands, no focal deficits.  HEENT:  PERRL, jvd low Cardiovascular: s1 s2 RRR  paced Lungs:  cta Abdomen:  Soft, tender diffusely, non-distended. BS hypo, no r/g Musculoskeletal: Thin, no edema.  Skin:  No rashes.   LABS:  BMET  Recent Labs Lab 06/17/2015 2337 06/17/2015 2347 2015/07/11 0525  NA 144 147* 139  K 3.4* 3.4* 5.3*  CL 116* 113* 94*  CO2 16*  --  18*  BUN 67* 62* 86*  CREATININE 3.73* 3.50* 5.56*  GLUCOSE 106* 98 281*    Electrolytes  Recent Labs Lab 05/29/2015 2337 07/11/2015 0525  CALCIUM 4.9* 6.9*  MG  --  2.0  PHOS  --  7.4*    CBC  Recent Labs Lab 05/25/2015 2337 06/13/2015 2347 07-11-15 0525  WBC 8.9  --  5.7  HGB 8.4* 8.5* 12.0  HCT 24.7* 25.0* 35.6*  PLT 234  --  342    Coag's  Recent Labs Lab  07-11-2015 0525  INR 1.47    Sepsis Markers  Recent Labs Lab 07/11/15 0058 07/11/2015 0217  LATICACIDVEN 2.91* 2.32*    ABG  Recent Labs Lab 07/11/2015 0215  PHART 7.278*  PCO2ART 38.5  PO2ART 73.0*    Liver Enzymes  Recent Labs Lab 06/12/2015 2337 July 11, 2015 0525  AST 882* 1881*  ALT 496* 932*  ALKPHOS 93 182*  BILITOT 0.3 0.9  ALBUMIN 1.3* 2.4*    Cardiac Enzymes  Recent Labs Lab July 11, 2015 0525  TROPONINI 0.21*    Glucose No results for input(s): GLUCAP in the last 168 hours.  Imaging Dg Chest Port 1 View  2015/07/11  CLINICAL DATA:  Right IJ line placement.  Initial encounter. EXAM: PORTABLE CHEST 1 VIEW COMPARISON:  Chest radiograph performed 05/31/2015 FINDINGS: The patient's right IJ line is noted ending about the mid to distal SVC. Minimal bilateral atelectasis is noted. No pleural effusion or pneumothorax is seen. The cardiomediastinal silhouette is borderline normal in size. A left-sided pacemaker is noted, with leads ending at the right atrium and right ventricle. No acute osseous abnormalities are identified. IMPRESSION: 1. Right IJ line noted ending about the mid to distal SVC. 2. Minimal bilateral atelectasis noted. Electronically Signed   By: Garald Balding M.D.   On: 2015/07/11 04:21   Dg Chest [facility] [address]  05/19/2015  CLINICAL DATA:  Hypotension and shortness of breath. EXAM: PORTABLE CHEST 1 VIEW COMPARISON:  Single view of the chest 02/13/2013. FINDINGS: Pacing device in place. The lungs are clear. Heart size is mildly enlarged. No pneumothorax or pleural effusion. IMPRESSION: Cardiomegaly without acute disease. Electronically Signed   By: Inge Rise M.D.   On: 05/21/2015 23:32   Dg Abd Portable 2v  Jul 04, 2015  CLINICAL DATA:  Abdominal pain and sepsis. EXAM: PORTABLE ABDOMEN - 2 VIEW COMPARISON:  Ultrasound kidneys 07/24/2014.  CT abdomen 02/04/2012 FINDINGS: Gas throughout the colon without abnormal distention. Stool-filled rectum. Gas within  some nondistended small bowel loops. No free intra-abdominal air. No abnormal air-fluid levels. Changes likely represent ileus. Surgical clips in the right upper quadrant. Vascular stents in the iliac region. Postoperative changes with bilateral hip arthroplasties. Degenerative changes in the spine. No radiopaque stones identified. IMPRESSION: Gas within nondistended small and large bowel, likely ileus or enteritis. Stool in the rectum. No evidence of obstruction or free air. Electronically Signed   By: Lucienne Capers M.D.   On: 07/04/2015 00:51     STUDIES:   CULTURES: Blood 1/24 >> Urine 1/24 >> C. Diff 1/25 >> negative  ANTIBIOTICS: Flagyl 1/24 >> 1/24 Vanc 1/24 >> Zosyn 1/24 >>  SIGNIFICANT EVENTS: 1/24  Admit with hypotension  LINES/TUBES: 1/25 Rt IJ CVL >>   DISCUSSION:  82 y.o. female w/ PMHx of chronic dCHF, CKD stage 4, Sick Sinus Syndrome s/p PPM, PVD, CA stenosis, h/o mesenteric ischemia (2013?), and likely breast CA, admitted for severe hypotension.   ASSESSMENT / PLAN:  PULMONARY A: Respiratory distress 2nd to acidosis. P:   Supplemental O2 for SpO2 < 92% Will have high rr with acidosis compensation, may need early comfort care  CARDIOVASCULAR A:  Hypovolemic shock / Overwhelming sirs / sepsis Troponin elevation likely 2/2 demand ischemia and dead bowel Atrial Fibrillation, not on anticoagulation. Sick Sinus s/p PPM. CAD. PVD, CA stenosis. P:  Goal CVP > 10, 5 this am bolus 500 Pressors to keep MAP > 60 Continue Amiodarone No further trop Continue ASA + Plavix Transfuse for Hb < 7 Levophed is at 63 m ic and is maxed, no increase will change outcome noa dd vasopressin as can contribute to dead bowel Cortisol adaqaute  RENAL A:   Acute on CKD stage 4 >> baseline creatinine 2 from 02/13/15. Hypokalemia Hypocalcemia Non-AG Metabolic Acidosis Mild Lactic Acidosis P:   HCO3, consider dc (there is NO nonag) Saline Not a candidate HD, will not  change toucome, family did not wish heroics  GASTROINTESTINAL A:   Diarrhea, abdominal pain >> concern for mesenteric Ischemia; has hx of this. H/o Diverticulosis. Transaminitis; most likely 2/2 shock liver/hypotension / dead bowel P:   Keep NPO for now except sips with meds Check amylase, lipase - wnl Repeat CMP in AM Check acute Hepatitis panel -pending RUQ [place] unlikely to survive, will discuss comfort, CT will not chang eoutcome, DNR noted  HEMATOLOGIC A:   Normocytic Anemia. FOBT positive. hemoconcetrating P:  Repeat CBC in AM Monitor stools Transfuse for Hb < 7 Bolus, add maintenance, assess cvp  INFECTIOUS A:   Possible sepsis from GI source versus UTI Hypothermia. At risk bacterial translocation P:   Continue Vanc/Zosyn for now Follow cultures [place]  ENDOCRINE A:   No adrnal insufficiency   P:   Follow CBG's q4h while NPO  NEUROLOGIC A:   Hx of dementia. P:   Hold sedating medications Will discuss maxed pressors and  comfort fent push, may need drip  FAMILY  - Updates: Family updated at bedside. PATIENT DNR, but family is okay with placement of CVL for administration of pressors.   CC time 45 minutes.  [name], [place] Pgr: [place]  Extensive discussion with family daughter x 2 We discussed the poor prognosis and likely poor quality of life. Family has decided to offer full comfort care. They are aware that the patient may be transferred to palliative care floor for continued comfort care needs. They have been fully updated on the process and expectations.  May need hospice beacan, will call pall care for that goals ar edone  Virginia Paganini. Titus Mould, MD, [place] Pgr: [place] [facility]

## 2015-06-18 NOTE — Progress Notes (Addendum)
Patient passed at 16:35, no lung sounds or heart tones auscultated by 2 RN's, Allena Katz, RN & Marian Sorrow, RN.  E-Link MD and CDS notified of death.  Support offered to and given to family. [name], [name]

## 2015-06-18 NOTE — Procedures (Signed)
Central Venous Catheter Insertion Procedure Note BAYLIN RASCON [id number] Aug 21, 1923  Procedure: Insertion of Central Venous Catheter Indications: Drug and/or fluid administration  Procedure Details Consent: Risks of procedure as well as the alternatives and risks of each were explained to the (patient/caregiver).  Consent for procedure obtained. Time Out: Verified patient identification, verified procedure, site/side was marked, verified correct patient position, special equipment/implants available, medications/allergies/relevent history reviewed, required imaging and test results available.  Performed  Maximum sterile technique was used including antiseptics, cap, gloves, gown, hand hygiene, mask and sheet. Skin prep: Chlorhexidine; local anesthetic administered A antimicrobial bonded/coated triple lumen catheter was placed in the right internal jugular vein using the Seldinger technique.  Evaluation Blood flow good Complications: No apparent complications Patient did tolerate procedure well. Chest X-ray ordered to verify placement.  CXR: pending.  Luanne Bras 06/15/15, 4:09 AM   CXR shows CVL in good position.  Chesley Mires, MD [facility] Pulmonary/Critical Care 2015/06/15, 5:12 AM Pager:  [phone] After 3pm call: [phone]

## 2015-06-18 NOTE — H&P (Signed)
PULMONARY / CRITICAL CARE MEDICINE   Name: SARAHMAE MARIA MRN: [id number] DOB: 10-10-1923    ADMISSION DATE:  06/15/2015  REFERRING MD:  ED  CHIEF COMPLAINT:  Diarrhea, hypotension  HISTORY OF PRESENT ILLNESS:   Ms. AMYIA LOCKHART is a 82 y.o. female w/ PMHx of chronic dCHF, CKD stage 4, Sick Sinus Syndrome s/p PPM, PVD, CA stenosis, h/o mesenteric ischemia (2013?), and likely breast CA (does not wish to pursue further treatment/workup), presents to the ED from nursing home w/ complaints of diarrhea, abdominal pain, and hypotension. Per family at bedside, diarrhea and abdominal pain started this AM, and significant hypotension started this evening, resulting in ED visit. Patient otherwise denies any other issues.    PAST MEDICAL HISTORY :  She  has a past medical history of Coronary artery disease; History of atrial flutter (2009); Paroxysmal atrial fibrillation (HCC); COPD (chronic obstructive pulmonary disease) ([place]); Hyperlipidemia; Diabetes mellitus; Diastolic congestive heart failure ([place]); Arthritis; Chronic kidney disease; Carotid artery disease ([place]); SSS (sick sinus syndrome) ([place]); Pneumonia; Anxiety; Pacemaker; Mesenteric ischemia ([place]); Diverticula of colon; PVD (peripheral vascular disease) ([place]); Pituitary tumor ([place]); Tachy-brady syndrome ([place]); ?? 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  PAST SURGICAL HISTORY: She  has past surgical history that includes Cardiac catheterization; Insert / replace / remove pacemaker (2009); Cholecystectomy; Total knee arthroplasty; Bilateral hip arthroscopy; tumor removed; myocardial perfusion study; adenosine  myoview study; carotid doppler (2/99); ECHO with LVH (1/98); stress cardiolite (3/04); GAMMA knife radiosurg (08/25/06); PVD; Colonoscopy (01/27/2012); and abdominal aortagram (N/A, 01/25/2012).  Allergies  Allergen Reactions  . Naproxen     REACTION: gastritis  . Nsaids     No current facility-administered medications on file prior to encounter.   Current Outpatient Prescriptions on File Prior to Encounter  Medication Sig  . Amino Acids-Protein Hydrolys (FEEDING SUPPLEMENT, PRO-STAT SUGAR FREE 64,) LIQD Take 30 mLs by mouth 2 (two) times daily. For wound healing  . amiodarone (PACERONE) 200 MG tablet Take 1 tablet (200 mg total) by mouth 2 (two) times daily.  [name] amLODipine (NORVASC) 10 MG tablet Take 10 mg by mouth daily.   [name] aspirin 81 MG tablet Take 81 mg by mouth daily.  . calcitRIOL (ROCALTROL) 0.25 MCG capsule Take 0.25 mcg by mouth 3 (three) times a week.  . carvedilol (COREG) 3.125 MG tablet Take 3.125 mg by mouth 2 (two) times daily with a meal.  . Cholecalciferol (VITAMIN D3) 50000 UNITS CAPS Take 1 capsule by mouth once a week.  . citalopram (CELEXA) 10 MG tablet Take 1 tablet (10 mg total) by mouth daily.  . cloNIDine (CATAPRES - DOSED IN MG/24 HR) 0.2 mg/24hr patch Place 0.2 mg onto the skin once a week. On Tuesday  . clopidogrel (PLAVIX) 75 MG tablet Take 75 mg by mouth daily with breakfast.  . digoxin (LANOXIN) 0.125 MG tablet Take 1 tablet (0.125 mg total) by mouth every other day.  . docusate sodium (COLACE) 100 MG capsule Take 100 mg by mouth 2 (two) times daily.   . ferrous sulfate 325 (65 FE) MG tablet Take 325 mg by mouth daily with breakfast.   . HYDROcodone-acetaminophen (NORCO/VICODIN) 5-325 MG per tablet Take one tablet by mouth every six hours as needed for pain. Do not exceed 4gm in 24 hours  .  levothyroxine (SYNTHROID, LEVOTHROID) 25 MCG tablet Take 25 mcg by mouth daily before breakfast.   . mirtazapine (REMERON) 15 MG tablet Take 15 mg by mouth at bedtime.  .  polyethylene glycol (MIRALAX / GLYCOLAX) packet Take 17 g by mouth 2 (two) times daily.  [name] Propylene Glycol (SYSTANE BALANCE) 0.6 % SOLN Apply 1 drop to eye 3 (three) times daily. Both eyes for dry eyes  . ranitidine (ZANTAC) 150 MG capsule Take 150 mg by mouth every morning.  . senna (SENOKOT) 8.6 MG TABS tablet Take 1 tablet by mouth at bedtime.  . Tiotropium Bromide Monohydrate (SPIRIVA RESPIMAT) 2.5 MCG/ACT AERS Inhale 2 Inhalers into the lungs daily. Use with aerochamber  . torsemide (DEMADEX) 20 MG tablet Take 20 mg by mouth daily.  . vitamin B-12 (CYANOCOBALAMIN) 1000 MCG tablet Take 1,000 mcg by mouth daily.  [name] zolpidem (AMBIEN CR) 6.25 MG CR tablet Take one tablet by mouth every night at bedtime for insomnia. Do not crush    FAMILY HISTORY:  Her indicated that her mother is deceased. She indicated that her father is deceased. She reported the following about her sister: x2 HTN. She reported the following about her brother: HTN, CAD, CV .   SOCIAL HISTORY: She  reports that she has been smoking Cigarettes.  She has a 10 pack-year smoking history. Her smokeless tobacco use includes Snuff. She reports that she does not drink alcohol or use illicit drugs.  REVIEW OF SYSTEMS:    General: Positive for decreased appetite and fatigue. Denies fever, diaphoresis.  Respiratory: Denies SOB, cough, and wheezing.   Cardiovascular: Denies chest pain and palpitations.  Gastrointestinal: Positive for diarrhea and abdominal pain. Denies nausea, vomiting. Musculoskeletal: Positive for gait problem (chronic). Denies myalgias, arthralgias, back pain.   Neurological: Positive for weakness and lightheadedness. Denies dizziness, syncope, and headaches.  Psychiatric/Behavioral: Denies mood changes, sleep disturbance, and agitation.  SUBJECTIVE:  Complaining of abdominal pain and weakness. SBP in the 60's on exam.   VITAL SIGNS: BP 155/89 mmHg  Pulse 59  Temp(Src) 95.1 F (35.1 C) (Rectal)  Resp 13   Ht 5\' 6"  (1.676 m)  Wt 135 lb (61.236 kg)  BMI 21.80 kg/m2  SpO2 97%  HEMODYNAMICS:    INTAKE / OUTPUT:    PHYSICAL EXAMINATION: General:  Thin appearing, elderly AA female, NAD, cooperative, answering questions.  Neuro: A&O x2. Follows commands, no focal deficits.  HEENT:  PERRL, EOMI. Dry mucus membranes. No JVD.  Cardiovascular: RRR, no murmurs, gallops, or rubs.  Lungs:  Air entry equal bilaterally, no wheezes, rales, or rhonchi.  Abdomen:  Soft, tender diffusely, non-distended. BS + Musculoskeletal: Thin, no edema.  Skin:  No rashes.   LABS:  BMET  Recent Labs Lab 05/22/2015 2337 05/22/2015 2347  NA 144 147*  K 3.4* 3.4*  CL 116* 113*  CO2 16*  --   BUN 67* 62*  CREATININE 3.73* 3.50*  GLUCOSE 106* 98    Electrolytes  Recent Labs Lab 06/03/2015 2337  CALCIUM 4.9*    CBC  Recent Labs Lab 05/28/2015 2337 05/28/2015 2347  WBC 8.9  --   HGB 8.4* 8.5*  HCT 24.7* 25.0*  PLT 234  --     Coag's No results for input(s): APTT, INR in the last 168 hours.  Sepsis Markers  Recent Labs Lab 2015-06-18 0058 June 18, 2015 0217  LATICACIDVEN 2.91* 2.32*    ABG  Recent Labs Lab 06-18-15 0215  PHART 7.278*  PCO2ART 38.5  PO2ART 73.0*  Liver Enzymes  Recent Labs Lab 06/06/2015 2337  AST 882*  ALT 496*  ALKPHOS 93  BILITOT 0.3  ALBUMIN 1.3*    Cardiac Enzymes No results for input(s): TROPONINI, PROBNP in the last 168 hours.  Glucose No results for input(s): GLUCAP in the last 168 hours.  Imaging Dg Chest Port 1 View  05/28/2015  CLINICAL DATA:  Hypotension and shortness of breath. EXAM: PORTABLE CHEST 1 VIEW COMPARISON:  Single view of the chest 02/13/2013. FINDINGS: Pacing device in place. The lungs are clear. Heart size is mildly enlarged. No pneumothorax or pleural effusion. IMPRESSION: Cardiomegaly without acute disease. Electronically Signed   By: Inge Rise M.D.   On: 06/02/2015 23:32   Dg Abd Portable 2v  07-05-2015  CLINICAL DATA:   Abdominal pain and sepsis. EXAM: PORTABLE ABDOMEN - 2 VIEW COMPARISON:  Ultrasound kidneys 07/24/2014.  CT abdomen 02/04/2012 FINDINGS: Gas throughout the colon without abnormal distention. Stool-filled rectum. Gas within some nondistended small bowel loops. No free intra-abdominal air. No abnormal air-fluid levels. Changes likely represent ileus. Surgical clips in the right upper quadrant. Vascular stents in the iliac region. Postoperative changes with bilateral hip arthroplasties. Degenerative changes in the spine. No radiopaque stones identified. IMPRESSION: Gas within nondistended small and large bowel, likely ileus or enteritis. Stool in the rectum. No evidence of obstruction or free air. Electronically Signed   By: Lucienne Capers M.D.   On: 07/05/2015 00:51     STUDIES:   CULTURES: Blood 1/24 >> Urine 1/24 >> C. Diff 1/25 >> negative  ANTIBIOTICS: Flagyl 1/24 >> 1/24 Vanc 1/24 >> Zosyn 1/24 >>  SIGNIFICANT EVENTS: 1/24  Admit with hypotension  LINES/TUBES: 1/25 Rt IJ CVL >>   DISCUSSION:  82 y.o. female w/ PMHx of chronic dCHF, CKD stage 4, Sick Sinus Syndrome s/p PPM, PVD, CA stenosis, h/o mesenteric ischemia (2013?), and likely breast CA, admitted for severe hypotension.   ASSESSMENT / PLAN:  PULMONARY A: Respiratory distress 2nd to acidosis. P:   Supplemental O2 for SpO2 < 92%  CARDIOVASCULAR A:  Hypovolemic shock. Troponin elevation likely 2/2 demand ischemia. Atrial Fibrillation, not on anticoagulation. Sick Sinus s/p PPM. CAD. PVD, CA stenosis. P:  Goal CVP > 8 Pressors to keep MAP > 65 Continue Amiodarone Hold Digoxin for now Hold home BP medications Repeat EKG in AM Trend troponins Continue ASA + Plavix Transfuse for Hb < 7  RENAL A:   Acute on CKD stage 4 >> baseline creatinine 2 from 02/13/15. Hypokalemia Hypocalcemia Non-AG Metabolic Acidosis Mild Lactic Acidosis P:   HCO3 in IV fluid Supp K Give 1 g Ca Gluconate Check mag, phos Trend  lactic acid  GASTROINTESTINAL A:   Diarrhea, abdominal pain >> concern for mesenteric Ischemia; has hx of this. H/o Diverticulosis. Transaminitis; most likely 2/2 shock liver/hypotension. P:   Keep NPO for now except sips with meds Check amylase, lipase Repeat CMP in AM Check acute Hepatitis panel RUQ [place]  HEMATOLOGIC A:   Normocytic Anemia. FOBT positive. P:  Repeat CBC in AM Monitor stools Transfuse for Hb < 7  INFECTIOUS A:   Possible sepsis from GI source versus UTI. Hypothermia. P:   Continue Vanc/Zosyn for now Follow cultures Trend WBC, fever curve Warming blanket  ENDOCRINE A:   ?Adrenal insufficiency   P:   Follow CBG's q4h while NPO Check AM cortisol  NEUROLOGIC A:   Hx of dementia. P:   Hold sedating medications   FAMILY  - Updates: Family updated at bedside. PATIENT  DNR, but family is okay with placement of CVL for administration of pressors.    Natasha Bence, MD PGY-3, Internal Medicine Pager: [phone]  06-22-15, 3:02 AM   Reviewed above, examined pt.  82 yo female from NH with altered mental status and diarrhea.  She also had abdominal pain.  She was hypotensive, and only had partial response to IV fluid.  She has hx of mesenteric ischemia and fecal occult blood positive.  Also concern for sepsis from UTI.  Has lactic acidosis and non gap acidosis.  She is alert, appears fatigued.  HR regular.  No wheeze.  Abd soft, diffusely tender.  No edema, decreased muscle bulk.  Assessment/Plan: Hypovolemic shock 2nd to diarrhea with concern for recurrent mesenteric ischemia. - monitor CVP with goal > 8 - pressors to keep MAP > 65  Sepsis with possible sources of GI versus UTI in NH patient. - continue vancomycin, zosyn - f/u cultures  AKI with hx of CKD. Gap/Non gap metabolic acidosis. - continue volume resuscitation - continue HCO3 in IV fluid for now  Goals of care >> DNR/DNI.  CC time by me independent of resident time is 42  minutes.  Chesley Mires, MD [facility] Pulmonary/Critical Care 06-22-15, 5:39 AM Pager:  [phone] After 3pm call: [phone]

## 2015-06-18 NOTE — Discharge Summary (Signed)
NAME:  KOLBI, LANING             ACCOUNT NO.:  [id number]  MEDICAL RECORD NO.:  [id number]  LOCATION:  2H01C                        FACILITY:  [facility]  PHYSICIAN:  Raylene Miyamoto, MD DATE OF BIRTH:  Mar 06, 1924  DATE OF ADMISSION:  06/07/2015 DATE OF DISCHARGE:  07/05/15                              DISCHARGE SUMMARY   DEATH SUMMARY:  This is an 82 year old female, with a past medical history of chronic diastolic heart failure, CKD stage IV, sick sinus syndrome, status post pacemaker, peripheral vascular disease, carotid stenosis, history of mesenteric ischemia in 2013, and likely breast cancer for which she has a recent mass noted in the nursing home on her breast, for which the patient refused workup and treatment.  The patient was basically found in the nursing home with complaints of diarrhea, abdominal pain, and hypertension; taken to [facility], found to be in shock with abdominal tenderness, with ultimate lactic acidosis.  DNR was established, as given the patient's multiple medical comorbidities and poor functional status at baseline.  The working diagnosis of course was dead bowel.  She continued to decline.  The family came in and wished for comfort care and comfort care was provided, and the patient expired.  FINAL DIAGNOSES:  Upon death: 1. Ischemic bowel. 2. Septic shock. 3. Hypovolemia. 4. Acute renal failure.     Raylene Miyamoto, MD     DJF/MEDQ  D:  06/17/2015  T:  06/17/2015  Job:  [id number]

## 2015-06-18 NOTE — Progress Notes (Signed)
ANTIBIOTIC CONSULT NOTE - INITIAL  Pharmacy Consult for Vancomycin/Zosyn  Indication: rule out sepsis  Allergies  Allergen Reactions  . Naproxen     REACTION: gastritis  . Nsaids    Patient Measurements: Height: 5\' 6"  (167.6 cm) Weight: 135 lb (61.236 kg) IBW/kg (Calculated) : 59.3  Vital Signs: Temp: 97.7 F (36.5 C) (01/25 0314) Temp Source: Rectal (01/25 0314) BP: 155/89 mmHg (01/25 0300) Pulse Rate: 59 (01/25 0300)  Labs:  Recent Labs  06/09/2015 2337 06/06/2015 2347  WBC 8.9  --   HGB 8.4* 8.5*  PLT 234  --   CREATININE 3.73* 3.50*   Estimated Creatinine Clearance: 10.2 mL/min (by C-G formula based on Cr of 3.5).   Medical History: Past Medical History  Diagnosis Date  . Coronary artery disease     a. Acute MI 2003, stent to LCx. b. NSTEMI 2008 (cath w/o PCI).  c. +trop 02/2011 felt 2/2 demand ischemia.  [name] History of atrial flutter 2009    a. s/p ablation of typical flutter 09/2007.  [name] Paroxysmal atrial fibrillation ([place])     a. Dx 2009. b. Discontinuation of Coumadin 01/2008 2/2 hemarthrosis/chronic debilitation.  [name] COPD (chronic obstructive pulmonary disease) ([place])   . Hyperlipidemia   . Diabetes mellitus   . Diastolic congestive heart failure ([place])   . Arthritis   . Chronic kidney disease   . Carotid artery disease ([facility])     a. Bilateral  . SSS (sick sinus syndrome) ([place])     a. Symptomatic bradycardia/syncope and post-termination pauses - [employer] Jude dual chamber PPM 09/2007.   [name] Pneumonia   . Anxiety     adjustment disorder  . Pacemaker   . Mesenteric ischemia ([place])     a. Possible dx, 2013.  . Diverticula of colon     a. Colonoscopy 01/2012.  [name] PVD (peripheral vascular disease) ([place])     Carotid dz as noted, repoted aortoiliac occlusive dz, also reported high-grade stenosis at the origin & prox innominate artery, high-grade stenosis of the right carotid bifurcation, moderate stenosis of the proximal prevertebral left subclavian artery by CT 02/2011  .  Pituitary tumor ([place])     s/p gamma knife  . Tachy-brady syndrome ([place])   . ?? Subclavian artery stenosis, right 04/04/2012  . SMA stenosis-moderate 04/04/2012  . Diverticulosis of colon (without mention of hemorrhage) 01/27/2012  . 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 CAROTID BRUIT, RIGHT 04/26/2007    Qualifier: Diagnosis of  By: Maxie Better FNP, Rosalita Levan   . Cardiac pacemaker in situ 09/15/2009    Qualifier: Diagnosis of  By: Lovena Le, MD, Martyn Malay   . GERD (gastroesophageal reflux disease) 08/09/2013  . Pressure ulcer, heel 06/28/2013    Assessment: 82 y/o F from NH with abdominal pain/diarrhea, WBC WNL, noted renal dysfunction, lactic acid elevated, other labs reviewed. Starting broad spectrum anti-biotics.   Goal of Therapy:  Vancomycin trough level 15-20 mcg/ml  Plan:  -Vancomycin 750 mg IV q48h -Zosyn 2.25g IV q8h -Trend WBC, temp, renal function  -Drug levels as indicated   Narda Bonds 11-Jul-2015,3:46 AM

## 2015-06-18 NOTE — Progress Notes (Signed)
Initial Nutrition Assessment  DOCUMENTATION CODES:   Severe malnutrition in context of acute illness/injury  INTERVENTION:  -Ensure Enlive po BID, each supplement provides 350 kcal and 20 grams of protein -RD to continue to monitor for needs  NUTRITION DIAGNOSIS:   Malnutrition related to acute illness as evidenced by percent weight loss, moderate depletion of body fat, moderate depletions of muscle mass, energy intake < 75% for > 7 days.  GOAL:   Patient will meet greater than or equal to 90% of their needs  MONITOR:   Supplement acceptance, I & O's, Labs, PO intake, Skin  REASON FOR ASSESSMENT:   Malnutrition Screening Tool    ASSESSMENT:   [name] is a 82 y.o. female w/ PMHx of chronic dCHF, CKD stage 4, Sick Sinus Syndrome s/p PPM, PVD, CA stenosis, h/o mesenteric ischemia (2013?), and likely breast CA (does not wish to pursue further treatment/workup), presents to the ED from nursing home w/ complaints of diarrhea, abdominal pain, and hypotension  Spoke with family at bedside. Pt was asleep during visit. Family reports pt was not eating the week prior to admission in nursing home. Pt continues to refuse trays during visit here. Per family, pt is on appetite stimulant at nursing home. Consider ordering here. Family also stated pt will wake up in the middle of the night and snack. Patient appears to have no appetite at this time, thus we should not have an issue with that.  Nutrition-Focused physical exam completed. Findings are moderate fat depletion, moderate muscle depletion, and no edema.    C Diff negative Family wishes for no heroic care. According to CCM Note, poor prognosis, pt unlikely to survive hospitalization. Believes pt to be suffering from shock liver/dead bowel Current goals of care: DNR/DNI - no heroics, but family allowed CVL to be placed for pressor administration  Drips: NoreEpi Sedation: Fentanyl  Labs: CBGs 95-157, K 5.3, BUN 86, Cr  5.56, Ca 6.9, Phos 7.4  Diet Order:  Diet NPO time specified  Skin:  Wound (see comment) (Stg II PU to Sacrum)  Last BM:  June 19, 2015  Height:   Ht Readings from Last 1 Encounters:  19-Jun-2015 5\' 5"  (1.651 m)    Weight:   Wt Readings from Last 1 Encounters:  06-19-15 128 lb 1.4 oz (58.1 kg)    Ideal Body Weight:  56.81 kg  BMI:  Body mass index is 21.31 kg/(m^2).  Estimated Nutritional Needs:   Kcal:  1450-1750  Protein:  60-70 grams  Fluid:  1.2L  EDUCATION NEEDS:   No education needs identified at this time  Satira Anis. Mahealani Sulak, MS, RD LDN After Hours/Weekend Pager [phone]

## 2015-06-18 NOTE — Progress Notes (Signed)
Resident of [employer]  CSW order received for comfort care.  Palliative services ordered to determine if residential hospice is indicated. Patient could also return to [facility] with Hospice if appropriate. CSW notified Admissions at [facility]- message left.Will assist with disposition as indicated.  Lorie Phenix. Pauline Good, [place]

## 2015-06-18 NOTE — Progress Notes (Signed)
240 ml of Fentanyl wasted in sink witnessed by 2 RN's, Eudell Mcphee C & Maricela Bo

## 2015-06-18 NOTE — Progress Notes (Signed)
   06/22/2015 1500  Clinical Encounter Type  Visited With Family;Patient and family together  Visit Type Patient actively dying  Referral From Nurse  Spiritual Encounters  Spiritual Needs Emotional;Grief support  Ch visited with family at bedside of pt who is on comfort care; grief, emotional and spiritual support offered; [facility] available if needed [phone].  3:09 PM Gwynn Burly

## 2015-06-18 DEATH — deceased

## 2015-06-19 ENCOUNTER — Telehealth: Payer: Self-pay

## 2015-06-19 NOTE — Telephone Encounter (Signed)
On 06/19/2015 I received a death certificate from [facility] (Original). The death certificate is for burial. The patient is a patient of Doctor Titus Mould. The death certificate will be taken to [facility] (2100) for signature. On 2015/07/21 I received the death certificate back from Doctor Titus Mould. I got the death certificate ready and called the funeral home to let them know the death certificate is ready for pickup.
